# Patient Record
Sex: Male | Born: 1970
Health system: Southern US, Community
[De-identification: ages and names within clinical notes are randomized; demographics above are authoritative.]

## PROBLEM LIST (undated history)

## (undated) DIAGNOSIS — K219 Gastro-esophageal reflux disease without esophagitis: Secondary | ICD-10-CM

## (undated) DIAGNOSIS — M549 Dorsalgia, unspecified: Secondary | ICD-10-CM

## (undated) DIAGNOSIS — L4050 Arthropathic psoriasis, unspecified: Secondary | ICD-10-CM

## (undated) DIAGNOSIS — J449 Chronic obstructive pulmonary disease, unspecified: Secondary | ICD-10-CM

## (undated) DIAGNOSIS — M542 Cervicalgia: Secondary | ICD-10-CM

## (undated) DIAGNOSIS — M1990 Unspecified osteoarthritis, unspecified site: Secondary | ICD-10-CM

## (undated) DIAGNOSIS — G4730 Sleep apnea, unspecified: Secondary | ICD-10-CM

## (undated) DIAGNOSIS — E039 Hypothyroidism, unspecified: Secondary | ICD-10-CM

## (undated) DIAGNOSIS — M25519 Pain in unspecified shoulder: Secondary | ICD-10-CM

## (undated) DIAGNOSIS — R519 Headache, unspecified: Secondary | ICD-10-CM

## (undated) DIAGNOSIS — R51 Headache: Secondary | ICD-10-CM

## (undated) DIAGNOSIS — I10 Essential (primary) hypertension: Secondary | ICD-10-CM

## (undated) DIAGNOSIS — G8929 Other chronic pain: Secondary | ICD-10-CM

## (undated) HISTORY — DX: Arthropathic psoriasis, unspecified: L40.50

## (undated) HISTORY — PX: NECK SURGERY: SHX720

## (undated) HISTORY — PX: JOINT REPLACEMENT: SHX530

## (undated) HISTORY — PX: SHOULDER SURGERY: SHX246

## (undated) HISTORY — DX: Essential (primary) hypertension: I10

---

## 2001-06-09 ENCOUNTER — Encounter (HOSPITAL_COMMUNITY): Admission: RE | Admit: 2001-06-09 | Discharge: 2001-07-09 | Payer: Self-pay | Admitting: Preventative Medicine

## 2001-07-26 ENCOUNTER — Emergency Department (HOSPITAL_COMMUNITY): Admission: EM | Admit: 2001-07-26 | Discharge: 2001-07-26 | Payer: Self-pay | Admitting: *Deleted

## 2002-06-09 ENCOUNTER — Ambulatory Visit (HOSPITAL_COMMUNITY): Admission: RE | Admit: 2002-06-09 | Discharge: 2002-06-09 | Payer: Self-pay | Admitting: Internal Medicine

## 2004-04-10 ENCOUNTER — Ambulatory Visit (HOSPITAL_COMMUNITY): Admission: RE | Admit: 2004-04-10 | Discharge: 2004-04-10 | Payer: Self-pay | Admitting: Family Medicine

## 2005-09-26 ENCOUNTER — Inpatient Hospital Stay (HOSPITAL_COMMUNITY): Admission: EM | Admit: 2005-09-26 | Discharge: 2005-09-29 | Payer: Self-pay | Admitting: Emergency Medicine

## 2005-10-06 ENCOUNTER — Encounter (HOSPITAL_COMMUNITY): Admission: RE | Admit: 2005-10-06 | Discharge: 2005-11-05 | Payer: Self-pay | Admitting: General Surgery

## 2005-10-15 ENCOUNTER — Emergency Department (HOSPITAL_COMMUNITY): Admission: EM | Admit: 2005-10-15 | Discharge: 2005-10-15 | Payer: Self-pay | Admitting: Emergency Medicine

## 2005-10-18 ENCOUNTER — Emergency Department (HOSPITAL_COMMUNITY): Admission: EM | Admit: 2005-10-18 | Discharge: 2005-10-18 | Payer: Self-pay | Admitting: *Deleted

## 2005-11-10 ENCOUNTER — Ambulatory Visit (HOSPITAL_COMMUNITY): Admission: RE | Admit: 2005-11-10 | Discharge: 2005-11-10 | Payer: Self-pay | Admitting: Neurosurgery

## 2005-11-30 ENCOUNTER — Ambulatory Visit (HOSPITAL_COMMUNITY): Admission: RE | Admit: 2005-11-30 | Discharge: 2005-12-01 | Payer: Self-pay | Admitting: Neurosurgery

## 2006-02-03 ENCOUNTER — Encounter (HOSPITAL_COMMUNITY): Admission: RE | Admit: 2006-02-03 | Discharge: 2006-03-05 | Payer: Self-pay | Admitting: Neurosurgery

## 2006-02-04 ENCOUNTER — Encounter (HOSPITAL_COMMUNITY): Admission: RE | Admit: 2006-02-04 | Discharge: 2006-03-06 | Payer: Self-pay | Admitting: Internal Medicine

## 2006-02-23 ENCOUNTER — Ambulatory Visit: Payer: Self-pay | Admitting: Internal Medicine

## 2006-03-01 ENCOUNTER — Ambulatory Visit (HOSPITAL_COMMUNITY): Admission: RE | Admit: 2006-03-01 | Discharge: 2006-03-01 | Payer: Self-pay | Admitting: Internal Medicine

## 2006-03-01 ENCOUNTER — Ambulatory Visit: Payer: Self-pay | Admitting: Internal Medicine

## 2006-04-29 ENCOUNTER — Ambulatory Visit (HOSPITAL_COMMUNITY): Admission: RE | Admit: 2006-04-29 | Discharge: 2006-04-29 | Payer: Self-pay | Admitting: Neurosurgery

## 2006-09-01 ENCOUNTER — Ambulatory Visit: Payer: Self-pay | Admitting: Physical Medicine and Rehabilitation

## 2006-09-01 ENCOUNTER — Encounter
Admission: RE | Admit: 2006-09-01 | Discharge: 2006-11-30 | Payer: Self-pay | Admitting: Physical Medicine and Rehabilitation

## 2006-09-20 ENCOUNTER — Ambulatory Visit (HOSPITAL_COMMUNITY)
Admission: RE | Admit: 2006-09-20 | Discharge: 2006-09-20 | Payer: Self-pay | Admitting: Physical Medicine and Rehabilitation

## 2006-10-15 ENCOUNTER — Ambulatory Visit: Payer: Self-pay | Admitting: Physical Medicine and Rehabilitation

## 2006-10-20 ENCOUNTER — Ambulatory Visit (HOSPITAL_COMMUNITY)
Admission: RE | Admit: 2006-10-20 | Discharge: 2006-10-20 | Payer: Self-pay | Admitting: Physical Medicine and Rehabilitation

## 2006-10-20 ENCOUNTER — Encounter
Admission: RE | Admit: 2006-10-20 | Discharge: 2006-10-20 | Payer: Self-pay | Admitting: Physical Medicine and Rehabilitation

## 2006-11-22 ENCOUNTER — Emergency Department (HOSPITAL_COMMUNITY): Admission: EM | Admit: 2006-11-22 | Discharge: 2006-11-22 | Payer: Self-pay | Admitting: Emergency Medicine

## 2008-12-23 ENCOUNTER — Emergency Department (HOSPITAL_COMMUNITY): Admission: EM | Admit: 2008-12-23 | Discharge: 2008-12-23 | Payer: Self-pay | Admitting: Emergency Medicine

## 2009-04-10 ENCOUNTER — Observation Stay (HOSPITAL_COMMUNITY): Admission: EM | Admit: 2009-04-10 | Discharge: 2009-04-11 | Payer: Self-pay | Admitting: Emergency Medicine

## 2009-04-10 ENCOUNTER — Ambulatory Visit: Payer: Self-pay | Admitting: Cardiology

## 2009-04-11 ENCOUNTER — Encounter (INDEPENDENT_AMBULATORY_CARE_PROVIDER_SITE_OTHER): Payer: Self-pay | Admitting: Internal Medicine

## 2010-01-26 ENCOUNTER — Encounter: Payer: Self-pay | Admitting: Internal Medicine

## 2010-03-26 LAB — COMPREHENSIVE METABOLIC PANEL
Albumin: 4.5 g/dL (ref 3.5–5.2)
BUN: 17 mg/dL (ref 6–23)
Creatinine, Ser: 1.02 mg/dL (ref 0.4–1.5)
Glucose, Bld: 97 mg/dL (ref 70–99)
Total Protein: 7.2 g/dL (ref 6.0–8.3)

## 2010-03-26 LAB — DIFFERENTIAL
Basophils Absolute: 0.1 10*3/uL (ref 0.0–0.1)
Lymphocytes Relative: 34 % (ref 12–46)
Monocytes Absolute: 0.6 10*3/uL (ref 0.1–1.0)
Monocytes Relative: 8 % (ref 3–12)
Neutro Abs: 3.8 10*3/uL (ref 1.7–7.7)
Neutrophils Relative %: 54 % (ref 43–77)

## 2010-03-26 LAB — POCT CARDIAC MARKERS
CKMB, poc: <1 ng/mL — ABNORMAL LOW (ref 1.0–8.0)
Myoglobin, poc: 60.5 ng/mL (ref 12–200)

## 2010-03-26 LAB — CARDIAC PANEL(CRET KIN+CKTOT+MB+TROPI)
Total CK: 125 U/L (ref 7–232)
Troponin I: 0.04 ng/mL (ref 0.00–0.06)

## 2010-03-26 LAB — CBC
HCT: 42.6 % (ref 39.0–52.0)
MCHC: 36.3 g/dL — ABNORMAL HIGH (ref 30.0–36.0)
MCV: 92 fL (ref 78.0–100.0)
Platelets: 158 10*3/uL (ref 150–400)
RDW: 12.7 % (ref 11.5–15.5)

## 2010-05-20 NOTE — Assessment & Plan Note (Signed)
INCOMPLETE           ______________________________  Brantley Stage, M.D.     DMK/MedQ  D:  11/15/2006 09:40:36  T:  11/15/2006 10:50:58  Job #:  [id number]
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
## 2010-05-23 NOTE — Consult Note (Signed)
NAME:  Charles [name], Charles [name]                           ACCOUNT NO.:  [id number]   MEDICAL RECORD NO.:  [id number]                   PATIENT TYPE:   LOCATION:                                       FACILITY:  [facility]   PHYSICIAN:  R. Roetta Sessions, M.D.              DATE OF BIRTH:  23-Apr-1970   DATE OF CONSULTATION:  05/24/2002  DATE OF DISCHARGE:                                   CONSULTATION   REFERRING PHYSICIAN:  Dr. Raelyn Number, ED physician at [facility].   CHIEF COMPLAINT:  The patient is a pleasant 40 year old gentleman sent over  courtesy of Dr. Nadene Rubins to further evaluate a couple of episodes of abdominal  cramps and gross blood per rectum last week.   HISTORY:  The patient's baseline malfunction is one bowel movement every 2-3  days.  He started having abdominal cramps and watery diarrhea with some  blood per rectum two days ago.  Abdominal cramps have persisted but the  diarrhea and rectal bleeding have settled down.  In the emergency department  he had a mildly elevated white count of 13.7, H&H 16.9 and 49.7.  Amylase  was normal  at 64.  LFTs were normal.  Calcium was slightly elevated at  10.5.  Apparently he was treated symptomatically and told to come here.  He  has not had any unusual travel, no unusual exposure, no one else around him  has been ill.  There is no family history of IBD or colorectal neoplasia.  He has not had any nausea or vomiting.  He denies weight loss.  I saw this  nice gentleman back in 1995 for gastroesophageal reflux disease, history of  erosive reflux esophagitis.  He was being worked up for antireflux surgery  but never had it done.  He has been maintained on his own on antacids and  over the counter acid suppressing agents.  He does not have any odynophagia,  dysphagia, early satiety, nausea or vomiting.  Again there has been no  melena but there has been some gross blood per rectum in association with  diarrhea.  He does not take any  nonsteroidal agents.   MEDICATIONS:  1. He has been given some Oxycodone for his abdominal pain.  2. He has been taking some Metamucil twice daily.  3. OTC Prilosec.   ALLERGIES:  No known drug allergies.   FAMILY HISTORY:  Negative for chronic GI or liver disease.   SOCIAL HISTORY:  Patient is single.  He does smoke.  He works in  Hol[employer].  He does not have a primary care physician and really has not  seen a doctor since he saw me in 1995.   REVIEW OF SYSTEMS:  No chest pain, no dyspnea, no fever, chills.   PHYSICAL EXAMINATION:  GENERAL:  A pleasant 40 year old gentleman who  appears comfortable.  He certainly does not appear in any distress  whatsoever.  VITAL SIGNS:  Weight 171, BP 104/68, pulse 76.  He weighed 162 pounds back  in 1995.  SKIN:  Warm and dry.  There is no jaundice.  HEENT:  No scleral icterus.  JVD is not prominent.  CHEST:  Lungs are clear to auscultation.  HEART:  Regular, rate and rhythm without murmur, gallop or rub.  ABDOMEN:  Nondistended.  Positive bowel sounds.  Soft, nontender without  appreciated mass, organomegaly.  EXTREMITIES:  No edema.  RECTAL:  Deferred till the time of colonoscopy.   IMPRESSION:  The patient is a 40 year old gentleman with a recent acute  illness characterized by diffuse abdominal cramps and bloody diarrhea.  His  diarrhea and blood per rectum have subsided but he is still having some  abdominal cramps.  I suspect most likely __________ enteric infection.  He  certainly does not appear acutely ill or toxic at this time.  His reflux  symptoms have been fairly well controlled with OTC Prilosec.  I do not  detect any __________ features.   RECOMMENDATIONS:  1. The patient really just needs to go ahead and have a colonoscopy.  I have     discussed this approach with the patient the potential risks, benefits,     alternative and a few questions answered and he is agreeable.  Will plan     to preform a colonoscopy in the  near future at [facility]  2. Further recommendations to follow.  3. I would like to thank Dr. Raelyn Number for letting me see this nice     gentleman today.  4. We will also give the patient some NuLev tablets one orally q.a.c. and     q.h.s.  p.r.n. abdominal cramps.                                               Jonathon Bellows, M.D.    RMR/MEDQ  D:  05/24/2002  T:  05/24/2002  Job:  [id number]   cc:   Raelyn Number  [facility]

## 2010-05-23 NOTE — Op Note (Signed)
NAMEMAXIE, SLOVACEK NO.:  [id number]   MEDICAL RECORD NO.:  [id number]          PATIENT TYPE:  AMB   LOCATION:  DAY                           FACILITY:  [facility]   PHYSICIAN:  R. Roetta Sessions, M.D. DATE OF BIRTH:  March 14, 1970   DATE OF PROCEDURE:  DATE OF DISCHARGE:                               OPERATIVE REPORT   PROCEDURE:  EGD, diagnostic.   INDICATIONS FOR PROCEDURE:  The patient is a 40 year old gentleman with  longstanding gastroesophageal reflux disease symptoms, intermittent  epigastric pain, intermittent vague esophageal dysphagia.  EGD is now  being done.  Potential for esophageal dilation has been reviewed and  questions answered.  He is agreeable.  Please see the documentation in  the medical record.   PROCEDURE NOTE:  O2 saturation, blood pressure, pulse and respirations  monitored throughout the entire procedure.  Conscious sedation was with  Versed 6 mg IV, Demerol 125 mg IV in divided doses.  Xylocaine spray for  topical oropharyngeal anesthesia.  The instrument used was the Pentax  video chip system.   FINDINGS:  Examination of the tubular esophagus revealed a  widely/patulous EG junction with four quadrant distal esophageal  erosions.  There was no ring, stricture or evidence neoplasm as with  Barrett's esophagus.  Again, the EG junction was wide open as was the  entire tubular esophagus.  Examination of the gastric mucosa was  undertaken after stomach fully inflated with air.  It insufflated very  well with air.  Thorough examination of the gastric mucosa, including  retroflex view of the proximal stomach and esophagogastric junction  demonstrated only a small hiatal hernia.  The pylorus was patent and  easily traversed.  Examination of the bulb and second portion revealed  no abnormalities.   THERAPEUTIC/DIAGNOSTIC MANEUVERS:  None.   The patient tolerated the procedure well and as reactive in endoscopy.   IMPRESSION:  Four quadrant  distal esophageal erosions consistent with  moderately severe erosive reflux esophagitis, widely patent esophagus,  patulous esophagogastric junction, hiatal hernia.  Otherwise normal  stomach and first and second portions of the duodenum.   No dilation needed.   RECOMMENDATIONS:  The patient is on no acid suppression therapy.  He  will begin Aciphex 20 tablets orally twice daily.  He is to go by my  office for samples.  Prescription given.  Antireflux literature provided  to Mr. Brandel.  We will plan to see him back in the office in 1 month.      Jonathon Bellows, M.D.  Electronically Signed     RMR/MEDQ  D:  03/01/2006  T:  03/01/2006  Job:  [id number]   cc:   Madelin Rear. Sherwood Gambler, MD  Fax: [phone]
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
## 2010-11-18 ENCOUNTER — Emergency Department (HOSPITAL_COMMUNITY)
Admission: EM | Admit: 2010-11-18 | Discharge: 2010-11-18 | Disposition: A | Discharge status: Home / Self Care | Payer: Self-pay | Attending: Emergency Medicine | Admitting: Emergency Medicine

## 2010-11-18 ENCOUNTER — Encounter: Payer: Self-pay | Admitting: *Deleted

## 2010-11-18 DIAGNOSIS — M792 Neuralgia and neuritis, unspecified: Secondary | ICD-10-CM

## 2010-11-18 DIAGNOSIS — R5381 Other malaise: Secondary | ICD-10-CM | POA: Insufficient documentation

## 2010-11-18 DIAGNOSIS — R110 Nausea: Secondary | ICD-10-CM | POA: Insufficient documentation

## 2010-11-18 DIAGNOSIS — R209 Unspecified disturbances of skin sensation: Secondary | ICD-10-CM | POA: Insufficient documentation

## 2010-11-18 DIAGNOSIS — IMO0002 Reserved for concepts with insufficient information to code with codable children: Secondary | ICD-10-CM | POA: Insufficient documentation

## 2010-11-18 DIAGNOSIS — R5383 Other fatigue: Secondary | ICD-10-CM | POA: Insufficient documentation

## 2010-11-18 LAB — DIFFERENTIAL
Eosinophils Relative: 2 % (ref 0–5)
Lymphocytes Relative: 34 % (ref 12–46)
Lymphs Abs: 2.4 10*3/uL (ref 0.7–4.0)
Monocytes Relative: 7 % (ref 3–12)

## 2010-11-18 LAB — URINALYSIS, ROUTINE W REFLEX MICROSCOPIC
Bilirubin Urine: NEGATIVE
Ketones, ur: NEGATIVE mg/dL
Nitrite: NEGATIVE
Protein, ur: NEGATIVE mg/dL
Urobilinogen, UA: 0.2 mg/dL (ref 0.0–1.0)

## 2010-11-18 LAB — COMPREHENSIVE METABOLIC PANEL
ALT: 45 U/L (ref 0–53)
Alkaline Phosphatase: 50 U/L (ref 39–117)
BUN: 13 mg/dL (ref 6–23)
CO2: 27 mEq/L (ref 19–32)
GFR calc Af Amer: >90 mL/min (ref >90)
GFR calc non Af Amer: >90 mL/min (ref >90)
Glucose, Bld: 87 mg/dL (ref 70–99)
Potassium: 3.8 mEq/L (ref 3.5–5.1)
Sodium: 137 mEq/L (ref 135–145)

## 2010-11-18 LAB — CBC
Hemoglobin: 15.2 g/dL (ref 13.0–17.0)
MCH: 32.8 pg (ref 26.0–34.0)
MCV: 92.7 fL (ref 78.0–100.0)
Platelets: 163 10*3/uL (ref 150–400)
RBC: 4.64 MIL/uL (ref 4.22–5.81)
WBC: 7.2 10*3/uL (ref 4.0–10.5)

## 2010-11-18 NOTE — ED Notes (Signed)
Pt c/o numbness to arms and legs bilateral. Pt states he has had sx for 2 weeks and it is getting worse.

## 2010-11-18 NOTE — ED Provider Notes (Signed)
History    Scribed for Benny Lennert, MD, the patient was seen in room APA07/APA07. This chart was scribed by Katha Cabal.   CSN: [id number] Arrival date & time: 11/18/2010  2:16 PM   First MD Initiated Contact with Patient 11/18/10 1527      Chief Complaint  Patient presents with  . Numbness    (Consider location/radiation/quality/duration/timing/severity/associated sxs/prior treatment)  History is provided by the patient.  Charles [name] is a 40 y.o. male who presents to the Emergency Department complaining of gradual worsening of mild to moderate intermittent bilateral UE and LE numbness.  Symptom is associated with intermittent weakness. Patient denies difficulty walking. Patient states that his legs at times "feel like the will give out on me".  Patient states weakness began in right LE about a month ago.  Numbness then began in left LE and then bilateral UE.  Patient reports gradual worsening of numbness for the last 2 weeks. Patient reports one episode of nausea yesterday and another episode today.  Symptoms are not associated with cough, chest pain, SOB or fever.  Patient reports cloudy urine. Patient reports numbness from knees upward.  Patient reports left LE numbness in ED.       History reviewed. No pertinent past medical history.  Past Surgical History  Procedure Date  . Shoulder surgery   . Neck surgery     No family history on file.  History  Substance Use Topics  . Smoking status: Former Games developer  . Smokeless tobacco: Not on file  . 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      Review of Systems  Constitutional: Negative for fever and fatigue.  HENT: Negative for congestion, sinus pressure and ear discharge.   Eyes: Negative for discharge.  Respiratory: Negative for cough.   Gastrointestinal: Positive for nausea. Negative for diarrhea.  Genitourinary: Negative for frequency and hematuria.  Musculoskeletal:  Negative for back pain.  Skin: Negative for rash.  Neurological: Positive for weakness and numbness. Negative for seizures.  Hematological: Negative.   Psychiatric/Behavioral: Negative for hallucinations.    Allergies  Review of patient's allergies indicates no known allergies.  Home Medications  No current outpatient prescriptions on file.  BP 132/71  Pulse 63  Temp(Src) 97.8 F (36.6 C) (Oral)  Resp 20  Ht 6' (1.829 m)  Wt 185 lb (83.915 kg)  BMI 25.09 kg/m2  SpO2 98%  Physical Exam  Constitutional: He is oriented to person, place, and time. He appears well-developed. No distress.  HENT:  Head: Normocephalic and atraumatic.  Right Ear: Tympanic membrane normal.  Left Ear: Tympanic membrane normal.  Mouth/Throat: Oropharynx is clear and moist.  Eyes: Conjunctivae and EOM are normal. Pupils are equal, round, and reactive to light. No scleral icterus.  Neck: Neck supple. No thyromegaly present.  Cardiovascular: Normal rate and regular rhythm.  Exam reveals no gallop and no friction rub.   No murmur heard. Pulmonary/Chest: Effort normal and breath sounds normal. No stridor. No respiratory distress. He has no wheezes. He has no rales. He exhibits no tenderness.  Abdominal: He exhibits no distension. There is no tenderness. There is no rebound.  Musculoskeletal: Normal range of motion. He exhibits no edema.  Lymphadenopathy:    He has no cervical adenopathy.  Neurological: He is alert and oriented to person, place, and time. He has normal strength. A sensory deficit is present. No cranial nerve deficit. Coordination normal.  Decreased sensation in left thigh and entire left arm   Skin: No rash noted. No erythema.       Very dry and cracked hands   Psychiatric: He has a normal mood and affect. His behavior is normal.    ED Course  Procedures (including critical care time)   DIAGNOSTIC STUDIES: Oxygen Saturation is 98% on room air, normal by my interpretation.     COORDINATION OF CARE:  3:42 PM  Physical exam complete.  Will order labs.  6:38 PM  Discussed lab findings with patient.   6:49 PM  Plan to discharge patient.  Patient agrees with plan.       LABS / RADIOLOGY:     Labs Reviewed  CBC  DIFFERENTIAL  COMPREHENSIVE METABOLIC PANEL  URINALYSIS, ROUTINE W REFLEX MICROSCOPIC   Results for orders placed during the hospital encounter of 11/18/10  CBC      Component Value Range   WBC 7.2  4.0 - 10.5 (K/uL)   RBC 4.64  4.22 - 5.81 (MIL/uL)   Hemoglobin 15.2  13.0 - 17.0 (g/dL)   HCT 11.9  14.7 - 82.9 (%)   MCV 92.7  78.0 - 100.0 (fL)   MCH 32.8  26.0 - 34.0 (pg)   MCHC 35.3  30.0 - 36.0 (g/dL)   RDW 56.2  13.0 - 86.5 (%)   Platelets 163  150 - 400 (K/uL)  DIFFERENTIAL      Component Value Range   Neutrophils Relative 56  43 - 77 (%)   Neutro Abs 4.1  1.7 - 7.7 (K/uL)   Lymphocytes Relative 34  12 - 46 (%)   Lymphs Abs 2.4  0.7 - 4.0 (K/uL)   Monocytes Relative 7  3 - 12 (%)   Monocytes Absolute 0.5  0.1 - 1.0 (K/uL)   Eosinophils Relative 2  0 - 5 (%)   Eosinophils Absolute 0.2  0.0 - 0.7 (K/uL)   Basophils Relative 0  0 - 1 (%)   Basophils Absolute 0.0  0.0 - 0.1 (K/uL)  COMPREHENSIVE METABOLIC PANEL      Component Value Range   Sodium 137  135 - 145 (mEq/L)   Potassium 3.8  3.5 - 5.1 (mEq/L)   Chloride 102  96 - 112 (mEq/L)   CO2 27  19 - 32 (mEq/L)   Glucose, Bld 87  70 - 99 (mg/dL)   BUN 13  6 - 23 (mg/dL)   Creatinine, Ser 7.84  0.50 - 1.35 (mg/dL)   Calcium 69.6  8.4 - 10.5 (mg/dL)   Total Protein 7.2  6.0 - 8.3 (g/dL)   Albumin 4.2  3.5 - 5.2 (g/dL)   AST 35  0 - 37 (U/L)   ALT 45  0 - 53 (U/L)   Alkaline Phosphatase 50  39 - 117 (U/L)   Total Bilirubin 0.8  0.3 - 1.2 (mg/dL)   GFR calc non Af Amer >90  >90 (mL/min)   GFR calc Af Amer >90  >90 (mL/min)  URINALYSIS, ROUTINE W REFLEX MICROSCOPIC      Component Value Range   Color, Urine YELLOW  YELLOW    Appearance CLEAR  CLEAR    Specific Gravity,  Urine 1.025  1.005 - 1.030    pH 5.5  5.0 - 8.0    Glucose, UA NEGATIVE  NEGATIVE (mg/dL)   Hgb urine dipstick NEGATIVE  NEGATIVE    Bilirubin Urine NEGATIVE  NEGATIVE    Ketones, ur NEGATIVE  NEGATIVE (mg/dL)  Protein, ur NEGATIVE  NEGATIVE (mg/dL)   Urobilinogen, UA 0.2  0.0 - 1.0 (mg/dL)   Nitrite NEGATIVE  NEGATIVE    Leukocytes, UA NEGATIVE  NEGATIVE      No results found.       MDM   [id number],  anxiety    MEDICATIONS GIVEN IN THE E.D. Scheduled Meds:   Continuous Infusions:       IMPRESSION: 1. Neuritis      DISCHARGE MEDICATIONS: New Prescriptions   No medications on file      The chart was scribed for me under my direct supervision.  I personally performed the history, physical, and medical decision making and all procedures in the evaluation of this patient.Benny Lennert, MD 11/18/10 [phone]

## 2010-11-18 NOTE — ED Notes (Signed)
Patient report given to this nurse. Assuming care of patient.  

## 2010-11-18 NOTE — ED Notes (Signed)
MD at bedside. 

## 2011-03-18 ENCOUNTER — Emergency Department (HOSPITAL_COMMUNITY): Payer: Self-pay

## 2011-03-18 ENCOUNTER — Other Ambulatory Visit: Payer: Self-pay

## 2011-03-18 ENCOUNTER — Emergency Department (HOSPITAL_COMMUNITY)
Admission: EM | Admit: 2011-03-18 | Discharge: 2011-03-18 | Disposition: A | Discharge status: Home / Self Care | Payer: Self-pay | Attending: Emergency Medicine | Admitting: Emergency Medicine

## 2011-03-18 ENCOUNTER — Encounter (HOSPITAL_COMMUNITY): Payer: Self-pay | Admitting: Emergency Medicine

## 2011-03-18 DIAGNOSIS — R0602 Shortness of breath: Secondary | ICD-10-CM | POA: Insufficient documentation

## 2011-03-18 DIAGNOSIS — R059 Cough, unspecified: Secondary | ICD-10-CM | POA: Insufficient documentation

## 2011-03-18 DIAGNOSIS — R05 Cough: Secondary | ICD-10-CM | POA: Insufficient documentation

## 2011-03-18 DIAGNOSIS — R079 Chest pain, unspecified: Secondary | ICD-10-CM | POA: Insufficient documentation

## 2011-03-18 DIAGNOSIS — R062 Wheezing: Secondary | ICD-10-CM | POA: Insufficient documentation

## 2011-03-18 DIAGNOSIS — J209 Acute bronchitis, unspecified: Secondary | ICD-10-CM | POA: Insufficient documentation

## 2011-03-18 LAB — DIFFERENTIAL
Basophils Absolute: 0 10*3/uL (ref 0.0–0.1)
Basophils Relative: 0 % (ref 0–1)
Eosinophils Absolute: 0.1 10*3/uL (ref 0.0–0.7)
Monocytes Relative: 8 % (ref 3–12)
Neutrophils Relative %: 80 % — ABNORMAL HIGH (ref 43–77)

## 2011-03-18 LAB — BASIC METABOLIC PANEL
BUN: 12 mg/dL (ref 6–23)
GFR calc Af Amer: >90 mL/min (ref >90)
GFR calc non Af Amer: >90 mL/min (ref >90)
Potassium: 3.7 mEq/L (ref 3.5–5.1)
Sodium: 137 mEq/L (ref 135–145)

## 2011-03-18 LAB — CBC
MCH: 32.6 pg (ref 26.0–34.0)
MCHC: 35.6 g/dL (ref 30.0–36.0)
Platelets: 150 10*3/uL (ref 150–400)
RDW: 12.4 % (ref 11.5–15.5)

## 2011-03-18 LAB — POCT I-STAT TROPONIN I: Troponin i, poc: 0 ng/mL (ref 0.00–0.08)

## 2011-03-18 MED ORDER — PREDNISONE 20 MG PO TABS: 60.0000 mg | ORAL_TABLET | Freq: Every day | ORAL | Status: DC

## 2011-03-18 MED ORDER — ALBUTEROL SULFATE (5 MG/ML) 0.5% IN NEBU
5.0000 mg | INHALATION_SOLUTION | Freq: Once | RESPIRATORY_TRACT | Status: AC
  Administered 2011-03-18: 5 mg via RESPIRATORY_TRACT
  Filled 2011-03-18: qty 1

## 2011-03-18 MED ORDER — AZITHROMYCIN 250 MG PO TABS
500.0000 mg | ORAL_TABLET | Freq: Once | ORAL | Status: AC
  Administered 2011-03-18: 500 mg via ORAL
  Filled 2011-03-18: qty 2

## 2011-03-18 MED ORDER — ALBUTEROL SULFATE HFA 108 (90 BASE) MCG/ACT IN AERS
2.0000 | INHALATION_SPRAY | RESPIRATORY_TRACT | Status: DC
  Administered 2011-03-18: 2 via RESPIRATORY_TRACT
  Filled 2011-03-18 (×2): qty 6.7

## 2011-03-18 MED ORDER — PREDNISONE 20 MG PO TABS
60.0000 mg | ORAL_TABLET | Freq: Once | ORAL | Status: AC
  Administered 2011-03-18: 60 mg via ORAL
  Filled 2011-03-18: qty 3

## 2011-03-18 MED ORDER — AZITHROMYCIN 250 MG PO TABS: 250.0000 mg | ORAL_TABLET | Freq: Every day | ORAL | Status: AC

## 2011-03-18 MED ORDER — ALBUTEROL SULFATE (5 MG/ML) 0.5% IN NEBU
10.0000 mg | INHALATION_SOLUTION | Freq: Once | RESPIRATORY_TRACT | Status: AC
  Administered 2011-03-18: 10 mg via RESPIRATORY_TRACT
  Filled 2011-03-18: qty 2

## 2011-03-18 MED ORDER — IPRATROPIUM BROMIDE 0.02 % IN SOLN
0.5000 mg | Freq: Once | RESPIRATORY_TRACT | Status: AC
  Administered 2011-03-18: 0.5 mg via RESPIRATORY_TRACT
  Filled 2011-03-18: qty 2.5

## 2011-03-18 NOTE — Discharge Instructions (Signed)
Acute Bronchitis You have acute bronchitis. This means you have a chest cold. The airways in your lungs are red and sore (inflamed). Acute means it is sudden onset.  CAUSES Bronchitis is most often caused by the same virus that causes a cold. SYMPTOMS   Body aches.   Chest congestion.   Chills.   Cough.   Fever.   Shortness of breath.   Sore throat.  TREATMENT  Acute bronchitis is usually treated with rest, fluids, and medicines for relief of fever or cough. Most symptoms should go away after a few days or a week. Increased fluids may help thin your secretions and will prevent dehydration. Your caregiver may give you an inhaler to improve your symptoms. The inhaler reduces shortness of breath and helps control cough. You can take over-the-counter pain relievers or cough medicine to decrease coughing, pain, or fever. A cool-air vaporizer may help thin bronchial secretions and make it easier to clear your chest. Antibiotics are usually not needed but can be prescribed if you smoke, are seriously ill, have chronic lung problems, are elderly, or you are at higher risk for developing complications.Allergies and asthma can make bronchitis worse. Repeated episodes of bronchitis may cause longstanding lung problems. Avoid smoking and secondhand smoke.Exposure to cigarette smoke or irritating chemicals will make bronchitis worse. If you are a cigarette smoker, consider using nicotine gum or skin patches to help control withdrawal symptoms. Quitting smoking will help your lungs heal faster. Recovery from bronchitis is often slow, but you should start feeling better after 2 to 3 days. Cough from bronchitis frequently lasts for 3 to 4 weeks. To prevent another bout of acute bronchitis:  Quit smoking.   Wash your hands frequently to get rid of viruses or use a hand sanitizer.   Avoid other people with cold or virus symptoms.   Try not to touch your hands to your mouth, nose, or eyes.  SEEK  IMMEDIATE MEDICAL CARE IF:  You develop increased fever, chills, or chest pain.   You have severe shortness of breath or bloody sputum.   You develop dehydration, fainting, repeated vomiting, or a severe headache.   You have no improvement after 1 week of treatment or you get worse.  MAKE SURE YOU:   Understand these instructions.   Will watch your condition.   Will get help right away if you are not doing well or get worse.  Document Released: 01/30/2004 Document Revised: 12/11/2010 Document Reviewed: 04/16/2010 [facility] Patient Information 2012 [place], [place].    Take your next dose of both the prednisone and the zithromax tomorrow evening.  Use the inhaler given - 2 puffs every 4 hours if you are wheezing,  Short of breath or coughing.  Rest,  Return here if you develop worsened symptoms.  Smoking is greatly contributing to your symptoms,  As discussed.Acute Bronchitis You have acute bronchitis. This means you have a chest cold. The airways in your lungs are red and sore (inflamed). Acute means it is sudden onset.  CAUSES Bronchitis is most often caused by the same virus that causes a cold. SYMPTOMS   Body aches.   Chest congestion.   Chills.   Cough.   Fever.   Shortness of breath.   Sore throat.  TREATMENT  Acute bronchitis is usually treated with rest, fluids, and medicines for relief of fever or cough. Most symptoms should go away after a few days or a week. Increased fluids may help thin your secretions and will prevent dehydration. Your  caregiver may give you an inhaler to improve your symptoms. The inhaler reduces shortness of breath and helps control cough. You can take over-the-counter pain relievers or cough medicine to decrease coughing, pain, or fever. A cool-air vaporizer may help thin bronchial secretions and make it easier to clear your chest. Antibiotics are usually not needed but can be prescribed if you smoke, are seriously ill, have chronic lung  problems, are elderly, or you are at higher risk for developing complications.Allergies and asthma can make bronchitis worse. Repeated episodes of bronchitis may cause longstanding lung problems. Avoid smoking and secondhand smoke.Exposure to cigarette smoke or irritating chemicals will make bronchitis worse. If you are a cigarette smoker, consider using nicotine gum or skin patches to help control withdrawal symptoms. Quitting smoking will help your lungs heal faster. Recovery from bronchitis is often slow, but you should start feeling better after 2 to 3 days. Cough from bronchitis frequently lasts for 3 to 4 weeks. To prevent another bout of acute bronchitis:  Quit smoking.   Wash your hands frequently to get rid of viruses or use a hand sanitizer.   Avoid other people with cold or virus symptoms.   Try not to touch your hands to your mouth, nose, or eyes.  SEEK IMMEDIATE MEDICAL CARE IF:  You develop increased fever, chills, or chest pain.   You have severe shortness of breath or bloody sputum.   You develop dehydration, fainting, repeated vomiting, or a severe headache.   You have no improvement after 1 week of treatment or you get worse.  MAKE SURE YOU:   Understand these instructions.   Will watch your condition.   Will get help right away if you are not doing well or get worse.  Document Released: 01/30/2004 Document Revised: 12/11/2010 Document Reviewed: 04/16/2010 [facility] Patient Information 2012 [place], [place].

## 2011-03-18 NOTE — ED Notes (Signed)
Pt c/o prod cough and congestion with mostly white but sometimes green sputum x 1 month. Sob started last night. Pressure across chest started this am constant since 630. Denies n/v/d/dizziness. C/o being weak all over. nondiaphoretic at this time. Nad

## 2011-03-18 NOTE — ED Provider Notes (Signed)
History     CSN: [id number]  Arrival date & time 03/18/11  1030   First MD Initiated Contact with Patient 03/18/11 1115      Chief Complaint  Patient presents with  . Shortness of Breath  . Chest Pain    (Consider location/radiation/quality/duration/timing/severity/associated sxs/prior treatment) HPI Comments: Patient presents with cough which has been productive of white and sometimes green sputum for the past month and has noted increased shortness of breath with exertion which has been worse since last night.  He denies fevers or chills, describes pressure across his bilateral lower anterior chest wall which is worse with deep inspiration and coughing.  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  He did not wear a mask or ventilator at work.  Patient is a 41 y.o. male presenting with shortness of breath and chest pain. The history is provided by the patient.  Shortness of Breath  The current episode started yesterday. The onset was gradual. The problem occurs continuously. The problem has been gradually worsening. The problem is moderate. The symptoms are relieved by nothing. The symptoms are aggravated by activity. Associated symptoms include chest pain, chest pressure, cough and wheezing. Pertinent negatives include no fever, no rhinorrhea, no sore throat and no shortness of breath.  Chest Pain Primary symptoms include cough and wheezing. Pertinent negatives for primary symptoms include no fever, no shortness of breath, no abdominal pain, no nausea and no dizziness.  Pertinent negatives for associated symptoms include no numbness and no weakness.     History reviewed. No pertinent past medical history.  Past Surgical History  Procedure Date  . Shoulder surgery   . Neck surgery     History reviewed. No pertinent family history.  History    Substance Use Topics  . Smoking status: Former Games developer  . Smokeless tobacco: Not on file  . Alcohol Use: Yes     frequently(only twice a week now as of today 03/18/11)      Review of Systems  Constitutional: Negative for fever.  HENT: Negative for congestion, sore throat, rhinorrhea and neck pain.   Eyes: Negative.   Respiratory: Positive for cough and wheezing. Negative for chest tightness and shortness of breath.   Cardiovascular: Positive for chest pain.  Gastrointestinal: Negative for nausea and abdominal pain.  Genitourinary: Negative.   Musculoskeletal: Negative for joint swelling and arthralgias.  Skin: Negative.  Negative for rash and wound.  Neurological: Negative for dizziness, weakness, light-headedness, numbness and headaches.  Hematological: Negative.   Psychiatric/Behavioral: Negative.     Allergies  Review of patient's allergies indicates no known allergies.  Home Medications   Current Outpatient Rx  Name Route Sig Dispense Refill  . EPINEPHRINE BASE 0.22 MG/ACT IN AERS Inhalation Inhale into the lungs once.    . AZITHROMYCIN 250 MG PO TABS Oral Take 1 tablet (250 mg total) by mouth daily. Take one tablet daily for 4 days. 4 tablet 0  . PREDNISONE 20 MG PO TABS Oral Take 3 tablets (60 mg total) by mouth daily. 15 tablet 0    BP 126/69  Pulse 106  Temp(Src) 97.9 F (36.6 C) (Oral)  Resp 20  Ht 6' (1.829 m)  Wt 210 lb (95.255 kg)  BMI 28.48 kg/m2  SpO2 95%  Physical Exam  Nursing note and vitals reviewed. Constitutional: He is oriented to  person, place, and time. He appears well-developed and well-nourished.  HENT:  Head: Normocephalic and atraumatic.  Eyes: Conjunctivae are normal.  Neck: Normal range of motion.  Cardiovascular: Normal rate, regular rhythm, normal heart sounds and intact distal pulses.   Pulmonary/Chest: Effort normal. He has wheezes. He has no rales. He exhibits no tenderness.       Wheezes throughout all lung fields.  Decreased  air movement.  Abdominal: Soft. Bowel sounds are normal. There is no tenderness.  Musculoskeletal: Normal range of motion.  Neurological: He is alert and oriented to person, place, and time.  Skin: Skin is warm and dry.  Psychiatric: He has a normal mood and affect.    ED Course  Procedures (including critical care time)  Labs Reviewed  DIFFERENTIAL - Abnormal; Notable for the following:    Neutrophils Relative 80 (*)    All other components within normal limits  BASIC METABOLIC PANEL - Abnormal; Notable for the following:    Glucose, Bld 102 (*)    All other components within normal limits  CBC  TROPONIN I  POCT I-STAT TROPONIN I   Dg Chest 2 View  03/18/2011  *RADIOLOGY REPORT*  Clinical Data: Shortness of breath and chest pain.  CHEST - 2 VIEW  Comparison: Chest x-ray 04/10/2009.  Findings: Lung volumes are normal.  No consolidative airspace disease.  No pleural effusions.  No pneumothorax.  No pulmonary nodule or mass noted.  Pulmonary vasculature and the cardiomediastinal silhouette are within normal limits.  Orthopedic fixation hardware incompletely visualized in the lower cervical spine.  IMPRESSION: 1. No radiographic evidence of acute cardiopulmonary disease.  Original Report Authenticated By: Florencia Reasons, M.D.     1. Bronchitis with bronchospasm     Patient treated with albuterol 5 mg and Atrovent 0.5 mg neb with moderate improvement in symptoms. Given prednisone 60 mg by mouth. Wheezing has almost resolved with increased aeration throughout all lung fields at reexam.  He does have crackles in his right mid to lower base at reexam.  Will give additional albuterol 10 mg over one hour neb treatment and reassess.  Wheezing resolved towards the end of second albuterol neb treatment.  Patient continues to have crackles at right mid to lower base.  Discussed with Dr. Oletta Lamas and will treat patient with albuterol MDI, prednisone 60 mg pulse dose and Zithromax with first dose  given today.  MDM  Bronchitis    Date: 03/18/2011  Rate: 88  Rhythm: normal sinus rhythm  QRS Axis: normal  Intervals: normal  ST/T Wave abnormalities: normal  Conduction Disutrbances:incomplete right bundle-branch block which is similar to previous EKG dated 04/10/2009  Narrative Interpretation:   Old EKG Reviewed: unchanged          Candis Musa, PA 03/18/11 1711  Candis Musa, PA 03/18/11 1711  Candis Musa, PA 03/18/11 1723

## 2011-03-20 NOTE — ED Provider Notes (Signed)
Medical screening examination/treatment/procedure(s) were conducted as a shared visit with non-physician practitioner(s) and myself.  I personally evaluated the patient during the encounter./ Stable vitals, RA sat is normal.  Pt with chest tightness, worse with exertion, feels dyspnea.  Pt with initial wheeze, now slight rales at both bases, but clear CXR per radiologist.  Pt felt improved after first neb treatment.  Will give additional.  Likely pt can go home with inhaler and oral steroids or inhaled steroids for a few days and instructions to follow up with PCP.    Gavin Pound. Oletta Lamas, MD 03/20/11 [phone]

## 2011-08-10 ENCOUNTER — Other Ambulatory Visit (HOSPITAL_COMMUNITY): Payer: Self-pay | Admitting: Family Medicine

## 2011-08-10 ENCOUNTER — Ambulatory Visit (HOSPITAL_COMMUNITY)
Admission: RE | Admit: 2011-08-10 | Discharge: 2011-08-10 | Disposition: A | Discharge status: Home / Self Care | Payer: BC Managed Care – PPO | Source: Ambulatory Visit | Attending: Family Medicine | Admitting: Family Medicine

## 2011-08-10 DIAGNOSIS — R059 Cough, unspecified: Secondary | ICD-10-CM | POA: Insufficient documentation

## 2011-08-10 DIAGNOSIS — Z720 Tobacco use: Secondary | ICD-10-CM

## 2011-08-10 DIAGNOSIS — F17200 Nicotine dependence, unspecified, uncomplicated: Secondary | ICD-10-CM | POA: Insufficient documentation

## 2011-08-10 DIAGNOSIS — J209 Acute bronchitis, unspecified: Secondary | ICD-10-CM | POA: Insufficient documentation

## 2011-08-10 DIAGNOSIS — R05 Cough: Secondary | ICD-10-CM | POA: Insufficient documentation

## 2011-10-16 ENCOUNTER — Other Ambulatory Visit (HOSPITAL_COMMUNITY): Payer: Self-pay | Admitting: Family Medicine

## 2011-10-16 ENCOUNTER — Ambulatory Visit (HOSPITAL_COMMUNITY)
Admission: RE | Admit: 2011-10-16 | Discharge: 2011-10-16 | Disposition: A | Discharge status: Home / Self Care | Payer: BC Managed Care – PPO | Source: Ambulatory Visit | Attending: Family Medicine | Admitting: Family Medicine

## 2011-10-16 DIAGNOSIS — M542 Cervicalgia: Secondary | ICD-10-CM

## 2011-10-16 DIAGNOSIS — M4040 Postural lordosis, site unspecified: Secondary | ICD-10-CM | POA: Insufficient documentation

## 2011-10-16 DIAGNOSIS — R079 Chest pain, unspecified: Secondary | ICD-10-CM | POA: Insufficient documentation

## 2011-11-18 ENCOUNTER — Emergency Department (HOSPITAL_COMMUNITY)
Admission: EM | Admit: 2011-11-18 | Discharge: 2011-11-18 | Disposition: A | Discharge status: Home / Self Care | Payer: BC Managed Care – PPO | Attending: Emergency Medicine | Admitting: Emergency Medicine

## 2011-11-18 ENCOUNTER — Encounter (HOSPITAL_COMMUNITY): Payer: Self-pay | Admitting: *Deleted

## 2011-11-18 DIAGNOSIS — H16133 Photokeratitis, bilateral: Secondary | ICD-10-CM

## 2011-11-18 DIAGNOSIS — H5789 Other specified disorders of eye and adnexa: Secondary | ICD-10-CM | POA: Insufficient documentation

## 2011-11-18 DIAGNOSIS — H16139 Photokeratitis, unspecified eye: Secondary | ICD-10-CM | POA: Insufficient documentation

## 2011-11-18 DIAGNOSIS — Z87891 Personal history of nicotine dependence: Secondary | ICD-10-CM | POA: Insufficient documentation

## 2011-11-18 DIAGNOSIS — H53149 Visual discomfort, unspecified: Secondary | ICD-10-CM | POA: Insufficient documentation

## 2011-11-18 DIAGNOSIS — J4489 Other specified chronic obstructive pulmonary disease: Secondary | ICD-10-CM | POA: Insufficient documentation

## 2011-11-18 DIAGNOSIS — J449 Chronic obstructive pulmonary disease, unspecified: Secondary | ICD-10-CM | POA: Insufficient documentation

## 2011-11-18 HISTORY — DX: Chronic obstructive pulmonary disease, unspecified: J44.9

## 2011-11-18 MED ORDER — FLUORESCEIN SODIUM 1 MG OP STRP
ORAL_STRIP | OPHTHALMIC | Status: AC
  Filled 2011-11-18: qty 1

## 2011-11-18 MED ORDER — HYDROCODONE-ACETAMINOPHEN 5-325 MG PO TABS
2.0000 | ORAL_TABLET | Freq: Once | ORAL | Status: AC
  Administered 2011-11-18: 2 via ORAL
  Filled 2011-11-18 (×2): qty 1

## 2011-11-18 MED ORDER — HYDROCODONE-ACETAMINOPHEN 5-325 MG PO TABS: 1.0000 | ORAL_TABLET | ORAL | Status: AC | PRN

## 2011-11-18 MED ORDER — NEOMYCIN-BACITRACIN ZN-POLYMYX 5-400-10000 OP OINT
TOPICAL_OINTMENT | Freq: Once | OPHTHALMIC | Status: AC
  Administered 2011-11-18: 05:00:00 via OPHTHALMIC
  Filled 2011-11-18: qty 3.5

## 2011-11-18 MED ORDER — TETRACAINE HCL 0.5 % OP SOLN
1.0000 [drp] | Freq: Once | OPHTHALMIC | Status: AC
  Administered 2011-11-18: 1 [drp] via OPHTHALMIC
  Filled 2011-11-18: qty 2

## 2011-11-18 NOTE — ED Notes (Signed)
Pt alert & oriented x4, stable gait. Patient given discharge instructions, paperwork & prescription(s). Patient  instructed to stop at the registration desk to finish any additional paperwork. Patient verbalized understanding. Pt left department w/ no further questions. 

## 2011-11-18 NOTE — ED Provider Notes (Signed)
History     CSN: [id number]  Arrival date & time 11/18/11  0212   First MD Initiated Contact with Patient 11/18/11 [phone]      Chief Complaint  Patient presents with  . Eye Problem    (Consider location/radiation/quality/duration/timing/severity/associated sxs/prior treatment) HPI Charles [name] is a 41 y.o. 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 Pain to both eyes that woke him from sleep. Unable to keep eyes open due to pain when looking or exposed to light. Watering, no purulent drainage. Has taken no medicines.   Past Medical History  Diagnosis Date  . COPD (chronic obstructive pulmonary disease)     Past Surgical History  Procedure Date  . Shoulder surgery   . Neck surgery     No family history on file.  History  Substance Use Topics  . Smoking status: Former Smoker -- 1.0 packs/day    Quit date: 08/06/2011  . Smokeless tobacco: Not on file  . Alcohol Use: Yes     Comment: frequently(only twice a week now as of today 03/18/11)      Review of Systems  Constitutional: Negative for fever.       10 Systems reviewed and are negative for acute change except as noted in the HPI.  HENT: Negative for congestion.   Eyes: Positive for photophobia and redness. Negative for discharge.       Bilateral eye pain  Respiratory: Negative for cough and shortness of breath.   Cardiovascular: Negative for chest pain.  Gastrointestinal: Negative for vomiting and abdominal pain.  Musculoskeletal: Negative for back pain.  Skin: Negative for rash.  Neurological: Negative for syncope, numbness and headaches.  Psychiatric/Behavioral:       No behavior change.    Allergies  Review of patient's allergies indicates no known allergies.  Home Medications   Current Outpatient Rx  Name  Route  Sig  Dispense  Refill  . HYDROCODONE-ACETAMINOPHEN  10-325 MG PO TABS   Oral   Take 1 tablet by mouth every 6 (six) hours as needed.         . IBUPROFEN 800 MG PO TABS   Oral   Take 800 mg by mouth every 8 (eight) hours as needed.         Marland Kitchen EPINEPHRINE BASE 0.22 MG/ACT IN AERS   Inhalation   Inhale into the lungs once.         Marland Kitchen HYDROCODONE-ACETAMINOPHEN 5-325 MG PO TABS   Oral   Take 1 tablet by mouth every 4 (four) hours as needed for pain.   10 tablet   0   . PREDNISONE 20 MG PO TABS   Oral   Take 3 tablets (60 mg total) by mouth daily.   15 tablet   0     BP 139/82  Pulse 82  Temp 97.7 F (36.5 C) (Oral)  Resp 20  Ht 6' (1.829 m)  Wt 195 lb (88.451 kg)  BMI 26.45 kg/m2  SpO2 99%  Physical Exam  Nursing note and vitals reviewed. Constitutional:       Awake, alert, nontoxic appearance.  HENT:  Head: Atraumatic.  Eyes: Right eye exhibits no discharge. Left eye exhibits no discharge.       Both eyes tearing, conjunctiva injected, tearing.  Neck: Normal range of motion. Neck supple.  Pulmonary/Chest: Effort normal. He exhibits no tenderness.  Abdominal: Soft.  There is no tenderness. There is no rebound.  Musculoskeletal: He exhibits no tenderness.       Baseline ROM, no obvious new focal weakness.  Neurological:       Mental status and motor strength appears baseline for patient and situation.  Skin: No rash noted.  Psychiatric: He has a normal mood and affect.    ED Course  Procedures (including critical care time)  Labs Reviewed - No data to display No results found.   1. 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 Pt stable in ED with no significant deterioration in condition.The patient appears reasonably screened and/or stabilized for discharge and I doubt any other medical condition or other [facility] requiring further screening,  evaluation, or treatment in the ED at this time prior to discharge.  MDM Reviewed: nursing note and vitals          Nicoletta Dress. Colon Branch, MD 11/18/11 [phone]

## 2011-11-18 NOTE — ED Notes (Signed)
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

## 2012-03-02 ENCOUNTER — Encounter (HOSPITAL_COMMUNITY): Payer: Self-pay

## 2012-03-02 ENCOUNTER — Emergency Department (HOSPITAL_COMMUNITY)
Admission: EM | Admit: 2012-03-02 | Discharge: 2012-03-02 | Disposition: A | Discharge status: Home / Self Care | Payer: 59 | Attending: Emergency Medicine | Admitting: Emergency Medicine

## 2012-03-02 DIAGNOSIS — Z87891 Personal history of nicotine dependence: Secondary | ICD-10-CM | POA: Insufficient documentation

## 2012-03-02 DIAGNOSIS — J4489 Other specified chronic obstructive pulmonary disease: Secondary | ICD-10-CM | POA: Insufficient documentation

## 2012-03-02 DIAGNOSIS — M549 Dorsalgia, unspecified: Secondary | ICD-10-CM

## 2012-03-02 DIAGNOSIS — Z79899 Other long term (current) drug therapy: Secondary | ICD-10-CM | POA: Insufficient documentation

## 2012-03-02 DIAGNOSIS — M545 Low back pain, unspecified: Secondary | ICD-10-CM | POA: Insufficient documentation

## 2012-03-02 DIAGNOSIS — J449 Chronic obstructive pulmonary disease, unspecified: Secondary | ICD-10-CM | POA: Insufficient documentation

## 2012-03-02 MED ORDER — METOPROLOL TARTRATE 1 MG/ML IV SOLN
INTRAVENOUS | Status: AC
  Filled 2012-03-02: qty 5

## 2012-03-02 MED ORDER — IBUPROFEN 800 MG PO TABS: 800.0000 mg | ORAL_TABLET | Freq: Three times a day (TID) | ORAL | Status: DC

## 2012-03-02 MED ORDER — HYDROCODONE-ACETAMINOPHEN 5-325 MG PO TABS: ORAL_TABLET | ORAL | Status: DC

## 2012-03-02 NOTE — ED Notes (Signed)
Pt c/o lower back pain that began early this morning. Pt states he has hx of "back problems" but has never been seen my MD for symptoms.

## 2012-03-02 NOTE — ED Provider Notes (Signed)
  Medical screening examination/treatment/procedure(s) were performed by non-physician practitioner and as supervising physician I was immediately available for consultation/collaboration.    Bonnita Newby D Arlen Dupuis, MD 03/02/12 1543 

## 2012-03-02 NOTE — ED Notes (Signed)
Pt reports history of lower back pain.  Reports woke up around 3am this morning c/o pain in lower back.

## 2012-03-02 NOTE — ED Provider Notes (Signed)
History     CSN: [id number]  Arrival date & time 03/02/12  1124   First MD Initiated Contact with Patient 03/02/12 1153      Chief Complaint  Patient presents with  . Back Pain    (Consider location/radiation/quality/duration/timing/severity/associated sxs/prior treatment) Patient is a 42 y.o. male presenting with back pain. The history is provided by the patient.  Back Pain Location:  Lumbar spine Quality:  Aching Pain severity:  Severe Pain is:  Same all the time Onset quality:  Sudden Timing:  Constant Progression:  Worsening Chronicity:  Chronic Relieved by:  Nothing Worsened by:  Movement, standing and twisting Ineffective treatments:  None tried Associated symptoms: no abdominal pain, no bladder incontinence, no bowel incontinence, no chest pain, no dysuria and no perianal numbness     Past Medical History  Diagnosis Date  . COPD (chronic obstructive pulmonary disease)     Past Surgical History  Procedure Laterality Date  . Shoulder surgery    . Neck surgery      No family history on file.  History  Substance Use Topics  . Smoking status: Former Smoker -- 1.00 packs/day    Quit date: 08/06/2011  . Smokeless tobacco: Not on file  . Alcohol Use: Yes     Comment: twice a week      Review of Systems  Constitutional: Negative for activity change.       All ROS Neg except as noted in HPI  HENT: Negative for nosebleeds and neck pain.   Eyes: Negative for photophobia and discharge.  Respiratory: Negative for cough, shortness of breath and wheezing.   Cardiovascular: Negative for chest pain and palpitations.  Gastrointestinal: Negative for abdominal pain, blood in stool and bowel incontinence.  Genitourinary: Negative for bladder incontinence, dysuria, frequency and hematuria.  Musculoskeletal: Positive for back pain. Negative for arthralgias.  Skin: Negative.   Neurological: Negative for dizziness, seizures and speech difficulty.   Psychiatric/Behavioral: Negative for hallucinations and confusion.    Allergies  Review of patient's allergies indicates no known allergies.  Home Medications   Current Outpatient Rx  Name  Route  Sig  Dispense  Refill  . EPINEPHrine Base (PRIMATENE MIST) 0.22 MG/ACT AERS   Inhalation   Inhale into the lungs once.         Marland Kitchen HYDROcodone-acetaminophen (NORCO) 10-325 MG per tablet   Oral   Take 1 tablet by mouth every 6 (six) hours as needed for pain.          Marland Kitchen ibuprofen (ADVIL,MOTRIN) 800 MG tablet   Oral   Take 800 mg by mouth every 8 (eight) hours as needed for pain.          Marland Kitchen omeprazole (PRILOSEC OTC) 20 MG tablet   Oral   Take 20 mg by mouth daily.           BP 132/73  Pulse 84  Temp(Src) 97.9 F (36.6 C) (Oral)  Resp 20  Ht 6' (1.829 m)  Wt 210 lb (95.255 kg)  BMI 28.47 kg/m2  SpO2 98%  Physical Exam  Nursing note and vitals reviewed. Constitutional: He is oriented to person, place, and time. He appears well-developed and well-nourished.  Non-toxic appearance.  HENT:  Head: Normocephalic.  Right Ear: Tympanic membrane and external ear normal.  Left Ear: Tympanic membrane and external ear normal.  Eyes: EOM and lids are normal. Pupils are equal, round, and reactive to light.  Neck: Normal range of motion. Neck supple. Carotid bruit is not  present.  Cardiovascular: Normal rate, regular rhythm, normal heart sounds, intact distal pulses and normal pulses.   Pulmonary/Chest: Breath sounds normal. No respiratory distress.  Few scattered wheezes appreciated.  Abdominal: Soft. Bowel sounds are normal. There is no tenderness. There is no guarding.  Musculoskeletal: Normal range of motion.  There is pain to palpation of the right paraspinal region of the lumbar area. There is no palpable step off of the lumbar area. There no hot areas appreciated. There is low back pain with straight leg raise on the right.  Lymphadenopathy:       Head (right side): No  submandibular adenopathy present.       Head (left side): No submandibular adenopathy present.    He has no cervical adenopathy.  Neurological: He is alert and oriented to person, place, and time. He has normal strength. No cranial nerve deficit or sensory deficit. He exhibits normal muscle tone. Coordination normal.  No foot drop. Gait steady.  Skin: Skin is warm and dry.  Psychiatric: He has a normal mood and affect. His speech is normal.    ED Course  Procedures (including critical care time)  Labs Reviewed - No data to display No results found.   No diagnosis found.    MDM  I have reviewed nursing notes, vital signs, and all appropriate lab and imaging results for this patient. Patient states he had a major injury to his neck and back approximately 6-7 years ago. States he's had problems with his back since that time, but has not been evaluated for these issues. This early a.m. he noted pain in his back that was more intense than the previous pains that he had been having. He states that he tried ibuprofen for this and this was not successful.  The examination reveals some right paraspinal area tenderness. As well as some pain in the lower lumbar area with change of position. No gross neurologic deficit is appreciated at this time. The gait is steady. There's been no loss of bowel or bladder function reported.  The plan at this time is for the patient to be treated with ibuprofen 800 mg 3 times daily with food, and Norco one or 2 tablets every 4 hours as needed for pain. Patient is also given the name of the orthopedist for additional evaluation and management of his ongoing back problems.       Kathie Dike, [place] 03/02/12 [phone]

## 2012-07-04 ENCOUNTER — Emergency Department (HOSPITAL_COMMUNITY)
Admission: EM | Admit: 2012-07-04 | Discharge: 2012-07-05 | Discharge status: Against Medical Advice | Payer: 59 | Attending: Emergency Medicine | Admitting: Emergency Medicine

## 2012-07-04 ENCOUNTER — Encounter (HOSPITAL_COMMUNITY): Payer: Self-pay | Admitting: Emergency Medicine

## 2012-07-04 DIAGNOSIS — J449 Chronic obstructive pulmonary disease, unspecified: Secondary | ICD-10-CM | POA: Insufficient documentation

## 2012-07-04 DIAGNOSIS — J4489 Other specified chronic obstructive pulmonary disease: Secondary | ICD-10-CM | POA: Insufficient documentation

## 2012-07-04 DIAGNOSIS — Z87891 Personal history of nicotine dependence: Secondary | ICD-10-CM | POA: Insufficient documentation

## 2012-07-04 DIAGNOSIS — R51 Headache: Secondary | ICD-10-CM | POA: Insufficient documentation

## 2012-07-04 LAB — CBC WITH DIFFERENTIAL/PLATELET
Basophils Relative: 0 % (ref 0–1)
Eosinophils Absolute: 0.1 10*3/uL (ref 0.0–0.7)
HCT: 42.4 % (ref 39.0–52.0)
Hemoglobin: 15.5 g/dL (ref 13.0–17.0)
MCH: 33.4 pg (ref 26.0–34.0)
MCHC: 36.6 g/dL — ABNORMAL HIGH (ref 30.0–36.0)
MCV: 91.4 fL (ref 78.0–100.0)
Monocytes Absolute: 0.6 10*3/uL (ref 0.1–1.0)
Monocytes Relative: 7 % (ref 3–12)

## 2012-07-04 LAB — COMPREHENSIVE METABOLIC PANEL
Albumin: 4.2 g/dL (ref 3.5–5.2)
BUN: 16 mg/dL (ref 6–23)
Creatinine, Ser: 0.93 mg/dL (ref 0.50–1.35)
GFR calc Af Amer: >90 mL/min (ref >90)
Total Bilirubin: 0.4 mg/dL (ref 0.3–1.2)
Total Protein: 7.2 g/dL (ref 6.0–8.3)

## 2012-07-04 NOTE — ED Notes (Addendum)
PT. REPORTS HEADACHE FOR SEVERAL WEEKS , GENERALIZED WEAKNESS WITH DIZZINESS FOR SEVERAL DAYS , AMBULATORY , DENIES FEVER OR CHILLS. ALERT AND ORIENTED . SPEECH CLEAR / EQUAL GRIPS . NO FACIAL ASYMMETRY.

## 2012-07-04 NOTE — ED Notes (Signed)
Pt called to room x1 w/no answer

## 2012-07-04 NOTE — ED Notes (Signed)
Called x2 for room placement w/no answer

## 2012-07-05 NOTE — ED Notes (Signed)
Called x3 for room w/no answer

## 2012-07-20 ENCOUNTER — Emergency Department (HOSPITAL_COMMUNITY): Payer: 59

## 2012-07-20 ENCOUNTER — Encounter (HOSPITAL_COMMUNITY): Payer: Self-pay | Admitting: *Deleted

## 2012-07-20 ENCOUNTER — Emergency Department (HOSPITAL_COMMUNITY)
Admission: EM | Admit: 2012-07-20 | Discharge: 2012-07-20 | Disposition: A | Discharge status: Home / Self Care | Payer: 59 | Attending: Emergency Medicine | Admitting: Emergency Medicine

## 2012-07-20 DIAGNOSIS — M549 Dorsalgia, unspecified: Secondary | ICD-10-CM

## 2012-07-20 DIAGNOSIS — F17200 Nicotine dependence, unspecified, uncomplicated: Secondary | ICD-10-CM | POA: Insufficient documentation

## 2012-07-20 DIAGNOSIS — J449 Chronic obstructive pulmonary disease, unspecified: Secondary | ICD-10-CM | POA: Insufficient documentation

## 2012-07-20 DIAGNOSIS — M545 Low back pain, unspecified: Secondary | ICD-10-CM | POA: Insufficient documentation

## 2012-07-20 DIAGNOSIS — J4489 Other specified chronic obstructive pulmonary disease: Secondary | ICD-10-CM | POA: Insufficient documentation

## 2012-07-20 LAB — URINALYSIS, ROUTINE W REFLEX MICROSCOPIC
Glucose, UA: NEGATIVE mg/dL
Leukocytes, UA: NEGATIVE
Nitrite: NEGATIVE
Protein, ur: NEGATIVE mg/dL
pH: 5 (ref 5.0–8.0)

## 2012-07-20 LAB — OCCULT BLOOD, POC DEVICE: Fecal Occult Bld: NEGATIVE

## 2012-07-20 MED ORDER — ONDANSETRON 8 MG PO TBDP
8.0000 mg | ORAL_TABLET | Freq: Once | ORAL | Status: AC
  Administered 2012-07-20: 8 mg via ORAL
  Filled 2012-07-20: qty 1

## 2012-07-20 MED ORDER — HYDROMORPHONE HCL PF 1 MG/ML IJ SOLN
1.0000 mg | Freq: Once | INTRAMUSCULAR | Status: AC
  Administered 2012-07-20: 1 mg via INTRAMUSCULAR
  Filled 2012-07-20: qty 1

## 2012-07-20 MED ORDER — PREDNISONE 10 MG PO TABS: ORAL_TABLET | ORAL | Status: DC

## 2012-07-20 MED ORDER — KETOROLAC TROMETHAMINE 60 MG/2ML IM SOLN
60.0000 mg | Freq: Once | INTRAMUSCULAR | Status: AC
  Administered 2012-07-20: 60 mg via INTRAMUSCULAR
  Filled 2012-07-20: qty 2

## 2012-07-20 MED ORDER — CYCLOBENZAPRINE HCL 10 MG PO TABS: 10.0000 mg | ORAL_TABLET | Freq: Three times a day (TID) | ORAL | Status: DC | PRN

## 2012-07-20 MED ORDER — CYCLOBENZAPRINE HCL 10 MG PO TABS
10.0000 mg | ORAL_TABLET | Freq: Once | ORAL | Status: AC
  Administered 2012-07-20: 10 mg via ORAL
  Filled 2012-07-20: qty 1

## 2012-07-20 NOTE — ED Notes (Signed)
Pt advised that he would be ready for discharge soon and would need to physically see a ride home, pt stated he was still trying to obtain a ride home.

## 2012-07-20 NOTE — ED Notes (Signed)
Pt reports low back pain since yesterday, denies any known injury, has similar pain in the past. Drove self to the ed today. Before given meds for pain was advised would need to get a driver home, pt stated he would.

## 2012-07-20 NOTE — ED Notes (Signed)
Lower back pain since yesterday evening. No known injury.

## 2012-07-20 NOTE — ED Provider Notes (Signed)
History    CSN: [id number] Arrival date & time 07/20/12  7829  First MD Initiated Contact with Patient 07/20/12 [phone]     Chief Complaint  Patient presents with  . Back Pain   (Consider location/radiation/quality/duration/timing/severity/associated sxs/prior Treatment) Patient is a 42 y.o. male presenting with back pain. The history is provided by the patient.  Back Pain Location:  Lumbar spine Quality:  Aching Radiates to:  Does not radiate Pain severity:  Moderate Pain is:  Same all the time Onset quality:  Gradual Duration:  1 day Timing:  Constant Progression:  Worsening Chronicity:  New Context comment:  Unknown injury Relieved by: sitting. Worsened by:  Ambulation, bending, standing and twisting Associated symptoms: no abdominal pain, no abdominal swelling, no bladder incontinence, no bowel incontinence, no chest pain, no dysuria, no fever, no headaches, no leg pain, no numbness, no paresthesias, no pelvic pain, no perianal numbness, no tingling and no weakness    Past Medical History  Diagnosis Date  . COPD (chronic obstructive pulmonary disease)    Past Surgical History  Procedure Laterality Date  . Shoulder surgery    . Neck surgery     No family history on file. History  Substance Use Topics  . Smoking status: Current Every Day Smoker -- 1.00 packs/day    Last Attempt to Quit: 08/06/2011  . Smokeless tobacco: Not on file  . Alcohol Use: Yes     Comment: twice a week    Review of Systems  Constitutional: Negative for fever.  Respiratory: Negative for shortness of breath.   Cardiovascular: Negative for chest pain.  Gastrointestinal: Negative for vomiting, abdominal pain, constipation and bowel incontinence.  Genitourinary: Negative for bladder incontinence, dysuria, hematuria, flank pain, decreased urine volume, difficulty urinating and pelvic pain.       No perineal numbness or incontinence of urine or feces  Musculoskeletal: Positive for back pain.  Negative for joint swelling.  Skin: Negative for rash.  Neurological: Negative for tingling, weakness, numbness, headaches and paresthesias.  All other systems reviewed and are negative.    Allergies  Review of patient's allergies indicates no known allergies.  Home Medications   Current Outpatient Rx  Name  Route  Sig  Dispense  Refill  . HYDROcodone-acetaminophen (NORCO) 10-325 MG per tablet   Oral   Take 1 tablet by mouth every 6 (six) hours as needed for pain.          Marland Kitchen ibuprofen (ADVIL,MOTRIN) 800 MG tablet   Oral   Take 800 mg by mouth every 8 (eight) hours as needed for pain.           BP 153/80  Pulse 81  Temp(Src) 98.5 F (36.9 C) (Oral)  Resp 16  Ht 6' (1.829 m)  Wt 206 lb (93.441 kg)  BMI 27.93 kg/m2  SpO2 96% Physical Exam  Nursing note and vitals reviewed. Constitutional: He is oriented to person, place, and time. He appears well-developed and well-nourished. No distress.  HENT:  Head: Normocephalic and atraumatic.  Neck: Normal range of motion. Neck supple.  Cardiovascular: Normal rate, regular rhythm, normal heart sounds and intact distal pulses.   No murmur heard. Pulmonary/Chest: Effort normal and breath sounds normal. No respiratory distress.  Abdominal: Soft. He exhibits no distension. There is no tenderness. There is no rebound and no guarding.  Genitourinary: Rectal exam shows no external hemorrhoid, no fissure, no mass, no tenderness and anal tone normal. Guaiac negative stool.  Musculoskeletal: He exhibits tenderness. He exhibits no  edema.       Lumbar back: He exhibits tenderness, bony tenderness and pain. He exhibits normal range of motion, no swelling, no deformity, no laceration and normal pulse.       Back:  ttp of the lower lumbar spine, paraspinal muscles and SI joints.  DP pulses are brisk and symmetrical.  Distal sensation intact.  Hip Flexors/Extensors are intact.  Pain reproduced with bilateral SLR  Neurological: He is alert and  oriented to person, place, and time. No cranial nerve deficit or sensory deficit. He exhibits normal muscle tone. Coordination and gait normal.  Reflex Scores:      Patellar reflexes are 2+ on the right side and 2+ on the left side.      Achilles reflexes are 2+ on the right side and 2+ on the left side. Skin: Skin is warm and dry.    ED Course  Procedures (including critical care time) Labs Reviewed  URINALYSIS, ROUTINE W REFLEX MICROSCOPIC - Abnormal; Notable for the following:    Specific Gravity, Urine <1.005 (*)    All other components within normal limits   Dg Lumbar Spine Complete  07/20/2012   *RADIOLOGY REPORT*  Clinical Data: Back pain.  LUMBAR SPINE - COMPLETE 4+ VIEW  Comparison: None  Findings: Moderate degenerative lumbar spondylosis with multilevel disc disease and facet disease.  There is reversal of the normal lumbar lordosis.  No acute bony findings or destructive bony changes.  No definite pars defects.  The visualized bony pelvis is intact.  IMPRESSION:  1.  Degenerative lumbar spondylosis with disc disease and facet disease somewhat advanced for age. 2.  No acute bony findings.   Original Report Authenticated By: Rudie Meyer, M.D.     MDM    Patient has ttp of the lumbar spine and paraspinal muscles.  No focal neuro deficits on exam.  Ambulates with a steady gait.   Lumbar spondylosis on x-ray, negative urinalysis.  Pt agrees to close f/u with his PMD.  Doubt emergent neurological or infectious process.  Patient feels better, appears stable for discharge.  Trashawn Oquendo L. Trisha Mangle, PA-C 07/21/12 2223

## 2012-07-24 NOTE — ED Provider Notes (Signed)
Medical screening examination/treatment/procedure(s) were performed by non-physician practitioner and as supervising physician I was immediately available for consultation/collaboration.  Amerie Beaumont B. Bernette Mayers, MD 07/24/12 1655

## 2012-09-19 ENCOUNTER — Encounter (HOSPITAL_COMMUNITY): Payer: Self-pay | Admitting: *Deleted

## 2012-09-19 ENCOUNTER — Emergency Department (HOSPITAL_COMMUNITY)
Admission: EM | Admit: 2012-09-19 | Discharge: 2012-09-19 | Disposition: A | Discharge status: Home / Self Care | Payer: 59 | Attending: Emergency Medicine | Admitting: Emergency Medicine

## 2012-09-19 DIAGNOSIS — Y9289 Other specified places as the place of occurrence of the external cause: Secondary | ICD-10-CM | POA: Insufficient documentation

## 2012-09-19 DIAGNOSIS — S058X9A Other injuries of unspecified eye and orbit, initial encounter: Secondary | ICD-10-CM | POA: Insufficient documentation

## 2012-09-19 DIAGNOSIS — IMO0002 Reserved for concepts with insufficient information to code with codable children: Secondary | ICD-10-CM | POA: Insufficient documentation

## 2012-09-19 DIAGNOSIS — J4489 Other specified chronic obstructive pulmonary disease: Secondary | ICD-10-CM | POA: Insufficient documentation

## 2012-09-19 DIAGNOSIS — F17200 Nicotine dependence, unspecified, uncomplicated: Secondary | ICD-10-CM | POA: Insufficient documentation

## 2012-09-19 DIAGNOSIS — J449 Chronic obstructive pulmonary disease, unspecified: Secondary | ICD-10-CM | POA: Insufficient documentation

## 2012-09-19 DIAGNOSIS — S0502XA Injury of conjunctiva and corneal abrasion without foreign body, left eye, initial encounter: Secondary | ICD-10-CM

## 2012-09-19 DIAGNOSIS — Y9389 Activity, other specified: Secondary | ICD-10-CM | POA: Insufficient documentation

## 2012-09-19 MED ORDER — FLUORESCEIN SODIUM 1 MG OP STRP
ORAL_STRIP | OPHTHALMIC | Status: AC
  Filled 2012-09-19: qty 1

## 2012-09-19 MED ORDER — TOBRAMYCIN 0.3 % OP SOLN
1.0000 [drp] | Freq: Once | OPHTHALMIC | Status: AC
  Administered 2012-09-19: 1 [drp] via OPHTHALMIC
  Filled 2012-09-19: qty 5

## 2012-09-19 MED ORDER — KETOROLAC TROMETHAMINE 0.5 % OP SOLN
1.0000 [drp] | Freq: Four times a day (QID) | OPHTHALMIC | Status: DC
  Administered 2012-09-19: 1 [drp] via OPHTHALMIC
  Filled 2012-09-19: qty 5

## 2012-09-19 MED ORDER — TETRACAINE HCL 0.5 % OP SOLN
2.0000 [drp] | Freq: Once | OPHTHALMIC | Status: AC
  Administered 2012-09-19: 2 [drp] via OPHTHALMIC
  Filled 2012-09-19: qty 2

## 2012-09-19 NOTE — ED Notes (Signed)
fb in lt eye, while working under his truck.

## 2012-09-19 NOTE — ED Notes (Signed)
Pt working on truck this morning around noon, lying on his back underneath and a small piece of debris fell into his eye.  No obvious trauma upon initial inspection and no debris noted.

## 2012-09-19 NOTE — ED Provider Notes (Signed)
CSN: [id number]     Arrival date & time 09/19/12  1327 History   First MD Initiated Contact with Patient 09/19/12 1413     Chief Complaint  Patient presents with  . Foreign Body in Eye   (Consider location/radiation/quality/duration/timing/severity/associated sxs/prior Treatment) HPI Comments: Charles [name] is a 42 y.o. male who presents to the Emergency Department complaining of  Pain and foreign body sensation to the left eye.  States the symptoms began suddenly while working underneath his truck.  Thinks he may have gotten a piece of metal in his eye.  He states that he washed his eye with an over counter eye wash solution prior to ED arrival.  He denies recent illness, vomiting, headache, dizziness.  Also states that he does not wear glasses or contacts.    Patient is a 42 y.o. male presenting with foreign body in eye. The history is provided by the patient.  Foreign Body in Eye This is a new problem. The current episode started today. The problem occurs constantly. The problem has been unchanged. Associated symptoms include a visual change. Pertinent negatives include no arthralgias, chest pain, congestion, fever, headaches, neck pain, numbness, rash, sore throat, swollen glands, urinary symptoms, vertigo, vomiting or weakness. Exacerbated by: sunlight , blinking. Treatments tried: rinsing his eyes with OTC eye wash solution. The treatment provided no relief.    Past Medical History  Diagnosis Date  . COPD (chronic obstructive pulmonary disease)    Past Surgical History  Procedure Laterality Date  . Shoulder surgery    . Neck surgery     History reviewed. No pertinent family history. History  Substance Use Topics  . Smoking status: Current Every Day Smoker -- 1.00 packs/day    Last Attempt to Quit: 08/06/2011  . Smokeless tobacco: Not on file  . Alcohol Use: Yes     Comment: twice a week    Review of Systems  Constitutional: Negative for fever.  HENT: Negative for congestion,  sore throat and neck pain.   Eyes: Positive for photophobia, pain, redness and visual disturbance. Negative for discharge and itching.  Respiratory: Negative for chest tightness and shortness of breath.   Cardiovascular: Negative for chest pain.  Gastrointestinal: Negative for vomiting.  Musculoskeletal: Negative for arthralgias.  Skin: Negative for rash.  Neurological: Negative for dizziness, vertigo, syncope, facial asymmetry, weakness, numbness and headaches.  Hematological: Negative for adenopathy.  All other systems reviewed and are negative.    Allergies  Review of patient's allergies indicates no known allergies.  Home Medications   Current Outpatient Rx  Name  Route  Sig  Dispense  Refill  . cyclobenzaprine (FLEXERIL) 10 MG tablet   Oral   Take 1 tablet (10 mg total) by mouth 3 (three) times daily as needed for muscle spasms.   21 tablet   0   . HYDROcodone-acetaminophen (NORCO) 10-325 MG per tablet   Oral   Take 1 tablet by mouth every 6 (six) hours as needed for pain.          Marland Kitchen ibuprofen (ADVIL,MOTRIN) 800 MG tablet   Oral   Take 800 mg by mouth every 8 (eight) hours as needed for pain.          . predniSONE (DELTASONE) 10 MG tablet      Take 6 tablets day one, 5 tablets day two, 4 tablets day three, 3 tablets day four, 2 tablets day five, then 1 tablet day six   21 tablet   0  BP 141/81  Pulse 81  Temp(Src) 98.2 F (36.8 C) (Oral)  Resp 18  Ht 6' (1.829 m)  Wt 212 lb (96.163 kg)  BMI 28.75 kg/m2  SpO2 96% Physical Exam  Nursing note and vitals reviewed. Constitutional: He is oriented to person, place, and time. He appears well-developed and well-nourished. No distress.  HENT:  Head: Normocephalic and atraumatic.  Eyes: EOM and lids are normal. Pupils are equal, round, and reactive to light. Lids are everted and swept, no foreign bodies found. Left eye exhibits no chemosis, no discharge, no exudate and no hordeolum. No foreign body present in  the left eye. Right conjunctiva is not injected. Left eye exhibits normal extraocular motion and no nystagmus.  Slit lamp exam:      The left eye shows corneal abrasion and fluorescein uptake. The left eye shows no corneal flare, no corneal ulcer, no foreign body and no hyphema.  Slit lamp exam reveals pin point area of fluorescein uptake at the 5 o'clock position over the left pupil.  Negative Seidel's sign.  No FB's seen  Neck: Normal range of motion. Neck supple. No thyromegaly present.  Cardiovascular: Normal rate, regular rhythm, normal heart sounds and intact distal pulses.   No murmur heard. Pulmonary/Chest: Effort normal and breath sounds normal. No respiratory distress.  Musculoskeletal: Normal range of motion.  Lymphadenopathy:    He has no cervical adenopathy.  Neurological: He is alert and oriented to person, place, and time. He exhibits normal muscle tone. Coordination normal.  Skin: Skin is warm and dry. No rash noted.    ED Course  Procedures (including critical care time) Labs Review Labs Reviewed - No data to display Imaging Review No results found.  MDM    Visual acuity :  Bilateral Distance: 20/40 ; R Distance: 20/40 ; L Distance: 20/50  Patient with a small corneal abrasion to left eye.  Pain improved after application of tetracaine.  Tobramycin and Ketorolac ophtho drops applied and dispensed for home use.  Pt agrees to avoid eye strain and close f/u with Dr. Lita Mains , referral given.    Patient also evaluated by EDP, Dr. Hyacinth Meeker.  Care plan discussed.  VSS.  Patient appears stable for discharge.  Maiah Sinning L. Trisha Mangle, PA-C 09/19/12 2010

## 2012-09-20 NOTE — ED Provider Notes (Signed)
42 year old male presents with left eye pain and redness. This occurred after he was working underneath his motor vehicle, rub his eye after feeling like something got into it. Since that time he has had pain in that eye. On my exam he has a corneal abrasion in the center of the left cornea, Seidel test is negative, minimal erythema to the conjunctiva, he can be treated with medications for corneal abrasion including topical antibiotics and pain medications. I have instructed the patient not to rub his eyes or to use contact lenses and to followup with ophthalmology within 24-48 hours. He has expressed his understanding.  Medical screening examination/treatment/procedure(s) were conducted as a shared visit with non-physician practitioner(s) and myself.  I personally evaluated the patient during the encounter.  Clinical Impression: Corneal abrasion left      Vida Roller, MD 09/20/12 [phone]

## 2013-02-01 ENCOUNTER — Emergency Department (HOSPITAL_COMMUNITY)
Admission: EM | Admit: 2013-02-01 | Discharge: 2013-02-01 | Disposition: A | Discharge status: Home / Self Care | Payer: Managed Care, Other (non HMO) | Attending: Emergency Medicine | Admitting: Emergency Medicine

## 2013-02-01 ENCOUNTER — Encounter (HOSPITAL_COMMUNITY): Payer: Self-pay | Admitting: Emergency Medicine

## 2013-02-01 DIAGNOSIS — R109 Unspecified abdominal pain: Secondary | ICD-10-CM | POA: Insufficient documentation

## 2013-02-01 DIAGNOSIS — J3489 Other specified disorders of nose and nasal sinuses: Secondary | ICD-10-CM | POA: Insufficient documentation

## 2013-02-01 DIAGNOSIS — J4489 Other specified chronic obstructive pulmonary disease: Secondary | ICD-10-CM | POA: Insufficient documentation

## 2013-02-01 DIAGNOSIS — R110 Nausea: Secondary | ICD-10-CM | POA: Insufficient documentation

## 2013-02-01 DIAGNOSIS — J111 Influenza due to unidentified influenza virus with other respiratory manifestations: Secondary | ICD-10-CM | POA: Insufficient documentation

## 2013-02-01 DIAGNOSIS — Z87891 Personal history of nicotine dependence: Secondary | ICD-10-CM | POA: Insufficient documentation

## 2013-02-01 DIAGNOSIS — R69 Illness, unspecified: Secondary | ICD-10-CM

## 2013-02-01 DIAGNOSIS — J449 Chronic obstructive pulmonary disease, unspecified: Secondary | ICD-10-CM | POA: Insufficient documentation

## 2013-02-01 MED ORDER — OSELTAMIVIR PHOSPHATE 75 MG PO CAPS: 75.0000 mg | ORAL_CAPSULE | Freq: Two times a day (BID) | ORAL | Status: DC

## 2013-02-01 MED ORDER — ONDANSETRON 8 MG PO TBDP: 8.0000 mg | ORAL_TABLET | Freq: Three times a day (TID) | ORAL | Status: DC | PRN

## 2013-02-01 NOTE — ED Provider Notes (Signed)
CSN: [id number]     Arrival date & time 02/01/13  1543 History   First MD Initiated Contact with Patient 02/01/13 1806     Chief Complaint  Patient presents with  . Abdominal Pain  . Cough   (Consider location/radiation/quality/duration/timing/severity/associated sxs/prior Treatment) HPI 43 year old male who presents today complaining of one to 2 days of sore throat, cough, congestion, nausea, and diarrhea. He has been taking by mouth without difficulty. He has had 2-3 episodes of loose stool. He has not been dyspneic but has coughed up some discolored phlegm. He denies any past medical history to me but it is listed that he has COPD. He denies taking any daily medications. He does not currently smoke cigarettes but uses a vaporized nicotine. He has been urinating as usual.  Past Medical History  Diagnosis Date  . COPD (chronic obstructive pulmonary disease)    Past Surgical History  Procedure Laterality Date  . Shoulder surgery    . Neck surgery     Family History  Problem Relation Age of Onset  . Heart failure Mother   . Diabetes Mother   . Hypertension Mother   . Cancer Father   . Heart failure Father    History  Substance Use Topics  . Smoking status: Former Smoker -- 1.00 packs/day    Types: Cigarettes    Quit date: 08/06/2011  . Smokeless tobacco: Not on file  . Alcohol Use: 3.6 oz/week    6 Cans of beer per week     Comment: twice a week    Review of Systems  All other systems reviewed and are negative.    Allergies  Review of patient's allergies indicates no known allergies.  Home Medications   Current Outpatient Rx  Name  Route  Sig  Dispense  Refill  . albuterol (PROVENTIL HFA;VENTOLIN HFA) 108 (90 BASE) MCG/ACT inhaler   Inhalation   Inhale 2 puffs into the lungs every 6 (six) hours as needed for wheezing or shortness of breath.         Marland Kitchen HYDROcodone-acetaminophen (NORCO) 10-325 MG per tablet   Oral   Take 1 tablet by mouth every 6 (six) hours as  needed for pain.          Marland Kitchen ibuprofen (ADVIL,MOTRIN) 800 MG tablet   Oral   Take 800 mg by mouth every 8 (eight) hours as needed.          BP 133/79  Pulse 74  Temp(Src) 97.4 F (36.3 C) (Oral)  Resp 16  Ht 6' (1.829 m)  Wt 215 lb (97.523 kg)  BMI 29.15 kg/m2  SpO2 98% Physical Exam  Nursing note and vitals reviewed. Constitutional: He is oriented to person, place, and time. He appears well-developed and well-nourished.  HENT:  Head: Normocephalic and atraumatic.  Right Ear: External ear normal.  Left Ear: External ear normal.  Nose: Nose normal.  Mouth/Throat: Oropharynx is clear and moist.  Eyes: Conjunctivae and EOM are normal. Pupils are equal, round, and reactive to light.  Neck: Normal range of motion. Neck supple.  Cardiovascular: Normal rate, regular rhythm, normal heart sounds and intact distal pulses.   Pulmonary/Chest: Effort normal and breath sounds normal. No respiratory distress. He has no wheezes. He exhibits no tenderness.  Abdominal: Soft. Bowel sounds are normal. He exhibits no distension and no mass. There is no tenderness. There is no guarding.  Musculoskeletal: Normal range of motion.  Neurological: He is alert and oriented to person, place, and time. He has  normal reflexes. He exhibits normal muscle tone. Coordination normal.  Skin: Skin is warm and dry.  Psychiatric: He has a normal mood and affect. His behavior is normal. Judgment and thought content normal.    ED Course  Procedures (including critical care time) Labs Review Labs Reviewed - No data to display Imaging Review No results found.  EKG Interpretation   None       MDM  No diagnosis found. 43 year old male with influenza-like symptoms. He will be started on Tamiflu. He is given Zofran for nausea. He is advised against using nicotine paper. He is given return precautions and will be given a work note.    Shaune Pollack, MD 02/01/13 [phone]

## 2013-02-01 NOTE — Discharge Instructions (Signed)
Influenza, Adult °Influenza (flu) is an infection in the mouth, nose, and throat (respiratory tract) caused by a virus. The flu can make you feel very ill. Influenza spreads easily from person to person (contagious).  °HOME CARE  °· Only take medicines as told by your doctor. °· Use a cool mist humidifier to make breathing easier. °· Get plenty of rest until your fever goes away. This usually takes 3 to 4 days. °· Drink enough fluids to keep your pee (urine) clear or pale yellow. °· Cover your mouth and nose when you cough or sneeze. °· Wash your hands well to avoid spreading the flu. °· Stay home from work or school until your fever has been gone for at least 1 full day. °· Get a flu shot every year. °GET HELP RIGHT AWAY IF:  °· You have trouble breathing or feel short of breath. °· Your skin or nails turn blue. °· You have severe neck pain or stiffness. °· You have a severe headache, facial pain, or earache. °· Your fever gets worse or keeps coming back. °· You feel sick to your stomach (nauseous), throw up (vomit), or have watery poop (diarrhea). °· You have chest pain. °· You have a deep cough that gets worse, or you cough up more thick spit (mucus). °MAKE SURE YOU:  °· Understand these instructions. °· Will watch your condition. °· Will get help right away if you are not doing well or get worse. °Document Released: 10/01/2007 Document Revised: 06/23/2011 Document Reviewed: 03/23/2011 °[employer]® Patient Information ©2014 [employer]. ° °

## 2013-02-01 NOTE — ED Notes (Signed)
Pt reports watery diarrhea, cough, body aches, chest discomfort and malaise since Tues.

## 2013-04-26 ENCOUNTER — Emergency Department (HOSPITAL_COMMUNITY): Payer: Managed Care, Other (non HMO)

## 2013-04-26 ENCOUNTER — Emergency Department (HOSPITAL_COMMUNITY)
Admission: EM | Admit: 2013-04-26 | Discharge: 2013-04-26 | Disposition: A | Discharge status: Home / Self Care | Payer: Managed Care, Other (non HMO) | Attending: Emergency Medicine | Admitting: Emergency Medicine

## 2013-04-26 ENCOUNTER — Encounter (HOSPITAL_COMMUNITY): Payer: Self-pay | Admitting: Emergency Medicine

## 2013-04-26 DIAGNOSIS — J449 Chronic obstructive pulmonary disease, unspecified: Secondary | ICD-10-CM | POA: Insufficient documentation

## 2013-04-26 DIAGNOSIS — R079 Chest pain, unspecified: Secondary | ICD-10-CM | POA: Insufficient documentation

## 2013-04-26 DIAGNOSIS — M129 Arthropathy, unspecified: Secondary | ICD-10-CM | POA: Insufficient documentation

## 2013-04-26 DIAGNOSIS — IMO0002 Reserved for concepts with insufficient information to code with codable children: Secondary | ICD-10-CM | POA: Insufficient documentation

## 2013-04-26 DIAGNOSIS — Z87891 Personal history of nicotine dependence: Secondary | ICD-10-CM | POA: Insufficient documentation

## 2013-04-26 DIAGNOSIS — R110 Nausea: Secondary | ICD-10-CM | POA: Insufficient documentation

## 2013-04-26 DIAGNOSIS — J4489 Other specified chronic obstructive pulmonary disease: Secondary | ICD-10-CM | POA: Insufficient documentation

## 2013-04-26 DIAGNOSIS — Z79899 Other long term (current) drug therapy: Secondary | ICD-10-CM | POA: Insufficient documentation

## 2013-04-26 LAB — BASIC METABOLIC PANEL
BUN: 26 mg/dL — AB (ref 6–23)
CHLORIDE: 100 meq/L (ref 96–112)
CO2: 24 meq/L (ref 19–32)
CREATININE: 0.94 mg/dL (ref 0.50–1.35)
Calcium: 10.1 mg/dL (ref 8.4–10.5)
GFR calc Af Amer: >90 mL/min (ref >90)
GFR calc non Af Amer: >90 mL/min (ref >90)
Glucose, Bld: 115 mg/dL — ABNORMAL HIGH (ref 70–99)
Potassium: 4 mEq/L (ref 3.7–5.3)
Sodium: 138 mEq/L (ref 137–147)

## 2013-04-26 LAB — CBC
HEMATOCRIT: 43.2 % (ref 39.0–52.0)
Hemoglobin: 15 g/dL (ref 13.0–17.0)
MCH: 32.4 pg (ref 26.0–34.0)
MCHC: 34.7 g/dL (ref 30.0–36.0)
MCV: 93.3 fL (ref 78.0–100.0)
Platelets: 168 10*3/uL (ref 150–400)
RBC: 4.63 MIL/uL (ref 4.22–5.81)
RDW: 12.3 % (ref 11.5–15.5)
WBC: 7.1 10*3/uL (ref 4.0–10.5)

## 2013-04-26 LAB — HEPATIC FUNCTION PANEL
ALK PHOS: 47 U/L (ref 39–117)
ALT: 19 U/L (ref 0–53)
AST: 21 U/L (ref 0–37)
Albumin: 4.7 g/dL (ref 3.5–5.2)
Bilirubin, Direct: <0.2 mg/dL (ref 0.0–0.3)
Total Bilirubin: 0.9 mg/dL (ref 0.3–1.2)
Total Protein: 7.5 g/dL (ref 6.0–8.3)

## 2013-04-26 LAB — LIPASE, BLOOD: LIPASE: 26 U/L (ref 11–59)

## 2013-04-26 LAB — TROPONIN I
Troponin I: <0.3 ng/mL (ref <0.30)
Troponin I: <0.3 ng/mL (ref <0.30)

## 2013-04-26 LAB — D-DIMER, QUANTITATIVE (NOT AT ARMC)

## 2013-04-26 MED ORDER — KETOROLAC TROMETHAMINE 30 MG/ML IJ SOLN
30.0000 mg | Freq: Once | INTRAMUSCULAR | Status: AC
  Administered 2013-04-26: 30 mg via INTRAVENOUS
  Filled 2013-04-26: qty 1

## 2013-04-26 MED ORDER — HYDROCODONE-ACETAMINOPHEN 10-325 MG PO TABS: 1.0000 | ORAL_TABLET | Freq: Four times a day (QID) | ORAL | Status: DC | PRN

## 2013-04-26 MED ORDER — HYDROMORPHONE HCL PF 1 MG/ML IJ SOLN
1.0000 mg | Freq: Once | INTRAMUSCULAR | Status: AC
  Administered 2013-04-26: 1 mg via INTRAVENOUS
  Filled 2013-04-26: qty 1

## 2013-04-26 MED ORDER — METHYLPREDNISOLONE SODIUM SUCC 125 MG IJ SOLR
125.0000 mg | INTRAMUSCULAR | Status: AC
  Administered 2013-04-26: 125 mg via INTRAVENOUS
  Filled 2013-04-26: qty 2

## 2013-04-26 MED ORDER — FENTANYL CITRATE 0.05 MG/ML IJ SOLN
50.0000 ug | Freq: Once | INTRAMUSCULAR | Status: AC
  Administered 2013-04-26: 50 ug via INTRAVENOUS
  Filled 2013-04-26: qty 2

## 2013-04-26 MED ORDER — PREDNISONE 20 MG PO TABS: 40.0000 mg | ORAL_TABLET | Freq: Every day | ORAL | Status: AC

## 2013-04-26 MED ORDER — ONDANSETRON HCL 4 MG/2ML IJ SOLN
4.0000 mg | Freq: Once | INTRAMUSCULAR | Status: AC
  Administered 2013-04-26: 4 mg via INTRAVENOUS
  Filled 2013-04-26: qty 2

## 2013-04-26 MED ORDER — ASPIRIN 81 MG PO CHEW
324.0000 mg | CHEWABLE_TABLET | Freq: Once | ORAL | Status: AC
  Administered 2013-04-26: 324 mg via ORAL
  Filled 2013-04-26: qty 4

## 2013-04-26 MED ORDER — SODIUM CHLORIDE 0.9 % IV SOLN
Freq: Once | INTRAVENOUS | Status: AC
  Administered 2013-04-26: 11:00:00 via INTRAVENOUS

## 2013-04-26 MED ORDER — HYDROMORPHONE HCL PF 1 MG/ML IJ SOLN: 1.0000 mg | Freq: Once | INTRAMUSCULAR | Status: DC

## 2013-04-26 MED ORDER — FAMOTIDINE IN NACL 20-0.9 MG/50ML-% IV SOLN
20.0000 mg | Freq: Once | INTRAVENOUS | Status: AC
  Administered 2013-04-26: 20 mg via INTRAVENOUS
  Filled 2013-04-26: qty 50

## 2013-04-26 MED ORDER — GI COCKTAIL ~~LOC~~
30.0000 mL | Freq: Once | ORAL | Status: AC
Start: 2013-04-26 — End: 2013-04-26
  Administered 2013-04-26: 30 mL via ORAL
  Filled 2013-04-26: qty 30

## 2013-04-26 MED ORDER — IBUPROFEN 800 MG PO TABS: 800.0000 mg | ORAL_TABLET | Freq: Three times a day (TID) | ORAL | Status: DC

## 2013-04-26 MED ORDER — MORPHINE SULFATE 4 MG/ML IJ SOLN
4.0000 mg | Freq: Once | INTRAMUSCULAR | Status: AC
  Administered 2013-04-26: 4 mg via INTRAVENOUS
  Filled 2013-04-26: qty 1

## 2013-04-26 NOTE — ED Notes (Signed)
Left sided cp with left arm numbness, bil jaw pain, weakness and nausea x 2 days.  Denies sob.  Reports was bitten by a tick also x 1 1/2 wks ago.

## 2013-04-26 NOTE — Discharge Instructions (Signed)
As discussed, your evaluation today has been largely reassuring.  But, it is important that you monitor your condition carefully, and do not hesitate to return to the ED if you develop new, or concerning changes in your condition.  Otherwise, please follow-up with your physician for appropriate ongoing care.  You should be   sure to discuss today's presentation, however you do tonight with new medication, and other possible causes for your discomfort.   Chest Pain (Nonspecific) It is often hard to give a specific diagnosis for the cause of chest pain. There is always a chance that your pain could be related to something serious, such as a heart attack or a blood clot in the lungs. You need to follow up with your caregiver for further evaluation. CAUSES   Heartburn.  Pneumonia or bronchitis.  Anxiety or stress.  Inflammation around your heart (pericarditis) or lung (pleuritis or pleurisy).  A blood clot in the lung.  A collapsed lung (pneumothorax). It can develop suddenly on its own (spontaneous pneumothorax) or from injury (trauma) to the chest.  Shingles infection (herpes zoster virus). The chest wall is composed of bones, muscles, and cartilage. Any of these can be the source of the pain.  The bones can be bruised by injury.  The muscles or cartilage can be strained by coughing or overwork.  The cartilage can be affected by inflammation and become sore (costochondritis). DIAGNOSIS  Lab tests or other studies, such as X-rays, electrocardiography, stress testing, or cardiac imaging, may be needed to find the cause of your pain.  TREATMENT   Treatment depends on what may be causing your chest pain. Treatment may include:  Acid blockers for heartburn.  Anti-inflammatory medicine.  Pain medicine for inflammatory conditions.  Antibiotics if an infection is present.  You may be advised to change lifestyle habits. This includes stopping smoking and avoiding alcohol, caffeine,  and chocolate.  You may be advised to keep your head raised (elevated) when sleeping. This reduces the chance of acid going backward from your stomach into your esophagus.  Most of the time, nonspecific chest pain will improve within 2 to 3 days with rest and mild pain medicine. HOME CARE INSTRUCTIONS   If antibiotics were prescribed, take your antibiotics as directed. Finish them even if you start to feel better.  For the next few days, avoid physical activities that bring on chest pain. Continue physical activities as directed.  Do not smoke.  Avoid drinking alcohol.  Only take over-the-counter or prescription medicine for pain, discomfort, or fever as directed by your caregiver.  Follow your caregiver's suggestions for further testing if your chest pain does not go away.  Keep any follow-up appointments you made. If you do not go to an appointment, you could develop lasting (chronic) problems with pain. If there is any problem keeping an appointment, you must call to reschedule. SEEK MEDICAL CARE IF:   You think you are having problems from the medicine you are taking. Read your medicine instructions carefully.  Your chest pain does not go away, even after treatment.  You develop a rash with blisters on your chest. SEEK IMMEDIATE MEDICAL CARE IF:   You have increased chest pain or pain that spreads to your arm, neck, jaw, back, or abdomen.  You develop shortness of breath, an increasing cough, or you are coughing up blood.  You have severe back or abdominal pain, feel nauseous, or vomit.  You develop severe weakness, fainting, or chills.  You have a fever.  THIS IS AN EMERGENCY. Do not wait to see if the pain will go away. Get medical help at once. Call your local emergency services (911 in [place] Do not drive yourself to the hospital. MAKE SURE YOU:   Understand these instructions.  Will watch your condition.  Will get help right away if you are not doing well or get  worse. Document Released: 10/01/2004 Document Revised: 03/16/2011 Document Reviewed: 07/28/2007 [facility] Patient Information 2014 [place].

## 2013-04-26 NOTE — ED Provider Notes (Signed)
CSN: [id number]     Arrival date & time 04/26/13  0945 History   This chart was scribed for Charles Muskrat, MD by Era Bumpers, ED scribe. This patient was seen in room APA14/APA14 and the patient's care was started at [facility].  Chief Complaint  Patient presents with  . Chest Pain   The history is provided by the patient. No language interpreter was used.   HPI Comments: Charles [name] is a 43 y.o. male who presents to the Emergency Department for left sided, constant, non-radiating chest pain, onset x3 days ago. He reports associated nausea and intermittent SOB. He was doing fine before x3 days ago. He is generally a healthy guy but has a hx of mild COPD. He is supposed to take prednisone for arthritis. He denies pain w/deep breath or increased SOB w/exertion such as walking up the stairs. He states some jaw stiffness bilaterally which began last PM and is still somewhat stiff. He denies emesis, diarrhea, fever/chills, abdominal pain, syncope, confusion, change in appetite or any new medicines.   He has no allergies to medicines. He quit smoking. He drinks beer a few beers to six beers once or twice a week. No change in amt/frequency of alcohol consumption.   Past Medical History  Diagnosis Date  . COPD (chronic obstructive pulmonary disease)    Past Surgical History  Procedure Laterality Date  . Shoulder surgery    . Neck surgery     Family History  Problem Relation Age of Onset  . Heart failure Mother   . Diabetes Mother   . Hypertension Mother   . Cancer Father   . Heart failure Father    History  Substance Use Topics  . Smoking status: Former Smoker -- 1.00 packs/day    Types: Cigarettes    Quit date: 08/06/2011  . Smokeless tobacco: Not on file  . Alcohol Use: 3.6 oz/week    6 Cans of beer per week     Comment: twice a week    Review of Systems  Constitutional: Negative for fever and chills.  Respiratory: Negative for cough and shortness of breath.   Cardiovascular:  Positive for chest pain.  Gastrointestinal: Positive for nausea. Negative for vomiting.  Musculoskeletal: Negative for back pain.  Skin: Negative for rash.  All other systems reviewed and are negative.  Allergies  Review of patient's allergies indicates no known allergies.  Home Medications   Prior to Admission medications   Medication Sig Start Date End Date Taking? Authorizing Provider  albuterol (PROVENTIL HFA;VENTOLIN HFA) 108 (90 BASE) MCG/ACT inhaler Inhale 2 puffs into the lungs every 6 (six) hours as needed for wheezing or shortness of breath.    Historical Provider, MD  HYDROcodone-acetaminophen (NORCO) 10-325 MG per tablet Take 1 tablet by mouth every 6 (six) hours as needed for pain.     Historical Provider, MD  ibuprofen (ADVIL,MOTRIN) 800 MG tablet Take 800 mg by mouth every 8 (eight) hours as needed.    Historical Provider, MD  ondansetron (ZOFRAN ODT) 8 MG disintegrating tablet Take 1 tablet (8 mg total) by mouth every 8 (eight) hours as needed for nausea or vomiting. 02/01/13   Shaune Pollack, MD  oseltamivir (TAMIFLU) 75 MG capsule Take 1 capsule (75 mg total) by mouth every 12 (twelve) hours. 02/01/13   Shaune Pollack, MD   Triage Vitals: BP 139/75  Temp(Src) 98.2 F (36.8 C) (Oral)  Resp 16  Ht 6' (1.829 m)  Wt 209 lb (94.802 kg)  BMI 28.34 kg/m2  SpO2 96%  Physical Exam  Nursing note and vitals reviewed. Constitutional: He is oriented to person, place, and time. He appears well-developed and well-nourished. No distress.  HENT:  Head: Normocephalic and atraumatic.  Eyes: Conjunctivae are normal. Right eye exhibits no discharge. Left eye exhibits no discharge.  Neck: Normal range of motion.  Cardiovascular: Normal rate, regular rhythm and normal heart sounds.   No murmur heard. Pulmonary/Chest: Effort normal and breath sounds normal. No respiratory distress. He has no wheezes. He has no rales.  Abdominal: Soft. He exhibits no distension. There is no tenderness.  There is no rebound and no guarding.  Musculoskeletal: Normal range of motion. He exhibits no edema.  Neurological: He is alert and oriented to person, place, and time.  Skin: Skin is warm and dry.  Psychiatric: He has a normal mood and affect. Thought content normal.    ED Course  Procedures (including critical care time) DIAGNOSTIC STUDIES: Oxygen Satura3tion is 96% on room air, adequate by my interpretation.    COORDINATION OF CARE: At 1020 AM Discussed treatment plan with patient which includes blood work, EKG, nausea/pain medicine, pepcid, CXR, cardiac enzymes. Patient agrees.   Labs Review Labs Reviewed  BASIC METABOLIC PANEL - Abnormal; Notable for the following:    Glucose, Bld 115 (*)    BUN 26 (*)    All other components within normal limits  CBC  TROPONIN I  LIPASE, BLOOD  HEPATIC FUNCTION PANEL  D-DIMER, QUANTITATIVE  TROPONIN I    Imaging Review Dg Chest 2 View  04/26/2013   CLINICAL DATA:  Chest pain.  Left-sided numbness  EXAM: CHEST  2 VIEW  COMPARISON:  10/16/2011  FINDINGS: Airway thickening is present, suggesting bronchitis or reactive airways disease. No airspace opacity is identified in the chest. Cardiac and mediastinal margins appear normal.  IMPRESSION: 1. Stable bilateral airway thickening suggesting bronchitis or reactive airways disease.   Electronically Signed   By: Sherryl Barters M.D.   On: 04/26/2013 11:23     EKG Interpretation   Date/Time:  Wednesday April 26 2013 09:59:43 EDT Ventricular Rate:  67 PR Interval:  150 QRS Duration: 104 QT Interval:  404 QTC Calculation: 426 R Axis:   31 Text Interpretation:  Normal sinus rhythm with sinus arrhythmia Incomplete  right bundle branch block Borderline ECG When compared with ECG of  18-Mar-2011 10:41, No significant change was found Sinus rhythm Sinus  arrhythmia Non-specific intra-ventricular conduction delay No significant  change since last tracing Abnormal ekg Confirmed by Charles Muskrat  MD  [phone]) on 04/26/2013 10:35:29 AM     2:59 PM On repeat exam the patient states that he feels better. Vital signs are stable. Second troponin is unremarkable. Patient voices an understanding of return precautions, follow up instructions.  MDM   This well-appearing young male presents with ongoing chest pain. On exam patient is awake, alert, and in no distress, moving all extremities spontaneously. The patient is afebrile, hemodynamically stable, has multiple negative troponins, nonischemic EKG, and reassuring physical exam. Patient does not smoke. No evidence of ongoing infection. With patient's history of COPD, ongoing pain, he was started on a course of steroids, anti-inflammatories, and will follow up with his primary care physician tomorrow at which point he will be reevaluated, and they can discuss the tick bite as well.   I personally performed the services described in this documentation, which was scribed in my presence. The recorded information has been reviewed and is accurate.  Charles Muskrat, MD 04/26/13 1501

## 2013-04-26 NOTE — ED Notes (Signed)
Pt reports worsening pain and worsening numbness to left arm.  edp notified.

## 2013-07-18 ENCOUNTER — Other Ambulatory Visit (HOSPITAL_COMMUNITY): Payer: Self-pay | Admitting: Internal Medicine

## 2013-07-18 DIAGNOSIS — M5442 Lumbago with sciatica, left side: Secondary | ICD-10-CM

## 2013-07-20 ENCOUNTER — Ambulatory Visit (HOSPITAL_COMMUNITY)
Admission: RE | Admit: 2013-07-20 | Discharge: 2013-07-20 | Disposition: A | Discharge status: Home / Self Care | Payer: Managed Care, Other (non HMO) | Source: Ambulatory Visit | Attending: Internal Medicine | Admitting: Internal Medicine

## 2013-07-20 DIAGNOSIS — M545 Low back pain, unspecified: Secondary | ICD-10-CM | POA: Insufficient documentation

## 2013-07-20 DIAGNOSIS — M79609 Pain in unspecified limb: Secondary | ICD-10-CM | POA: Insufficient documentation

## 2013-07-20 DIAGNOSIS — M159 Polyosteoarthritis, unspecified: Secondary | ICD-10-CM | POA: Insufficient documentation

## 2013-07-20 DIAGNOSIS — M51379 Other intervertebral disc degeneration, lumbosacral region without mention of lumbar back pain or lower extremity pain: Secondary | ICD-10-CM | POA: Insufficient documentation

## 2013-07-20 DIAGNOSIS — M5442 Lumbago with sciatica, left side: Secondary | ICD-10-CM

## 2013-07-20 DIAGNOSIS — M5137 Other intervertebral disc degeneration, lumbosacral region: Secondary | ICD-10-CM | POA: Insufficient documentation

## 2013-08-22 ENCOUNTER — Ambulatory Visit (HOSPITAL_COMMUNITY)
Admission: RE | Admit: 2013-08-22 | Payer: Managed Care, Other (non HMO) | Source: Ambulatory Visit | Admitting: Physical Therapy

## 2014-03-16 ENCOUNTER — Emergency Department (HOSPITAL_COMMUNITY): Payer: BLUE CROSS/BLUE SHIELD

## 2014-03-16 ENCOUNTER — Encounter (HOSPITAL_COMMUNITY): Payer: Self-pay | Admitting: Emergency Medicine

## 2014-03-16 ENCOUNTER — Emergency Department (HOSPITAL_COMMUNITY)
Admission: EM | Admit: 2014-03-16 | Discharge: 2014-03-16 | Disposition: A | Discharge status: Home / Self Care | Payer: BLUE CROSS/BLUE SHIELD | Attending: Emergency Medicine | Admitting: Emergency Medicine

## 2014-03-16 DIAGNOSIS — Z79899 Other long term (current) drug therapy: Secondary | ICD-10-CM | POA: Insufficient documentation

## 2014-03-16 DIAGNOSIS — Z87891 Personal history of nicotine dependence: Secondary | ICD-10-CM | POA: Diagnosis not present

## 2014-03-16 DIAGNOSIS — G8929 Other chronic pain: Secondary | ICD-10-CM | POA: Diagnosis not present

## 2014-03-16 DIAGNOSIS — J449 Chronic obstructive pulmonary disease, unspecified: Secondary | ICD-10-CM | POA: Insufficient documentation

## 2014-03-16 DIAGNOSIS — R0789 Other chest pain: Secondary | ICD-10-CM

## 2014-03-16 DIAGNOSIS — R079 Chest pain, unspecified: Secondary | ICD-10-CM | POA: Diagnosis present

## 2014-03-16 HISTORY — DX: Headache, unspecified: R51.9

## 2014-03-16 HISTORY — DX: Dorsalgia, unspecified: M54.9

## 2014-03-16 HISTORY — DX: Headache: R51

## 2014-03-16 HISTORY — DX: Other chronic pain: G89.29

## 2014-03-16 HISTORY — DX: Cervicalgia: M54.2

## 2014-03-16 HISTORY — DX: Pain in unspecified shoulder: M25.519

## 2014-03-16 LAB — TROPONIN I: Troponin I: <0.03 ng/mL (ref <0.031)

## 2014-03-16 LAB — CBC
HCT: 42.8 % (ref 39.0–52.0)
Hemoglobin: 14.9 g/dL (ref 13.0–17.0)
MCH: 33 pg (ref 26.0–34.0)
MCHC: 34.8 g/dL (ref 30.0–36.0)
MCV: 94.9 fL (ref 78.0–100.0)
Platelets: 156 10*3/uL (ref 150–400)
RBC: 4.51 MIL/uL (ref 4.22–5.81)
RDW: 12.6 % (ref 11.5–15.5)
WBC: 6 10*3/uL (ref 4.0–10.5)

## 2014-03-16 LAB — BASIC METABOLIC PANEL
Anion gap: 7 (ref 5–15)
BUN: 16 mg/dL (ref 6–23)
CHLORIDE: 109 mmol/L (ref 96–112)
CO2: 23 mmol/L (ref 19–32)
CREATININE: 0.85 mg/dL (ref 0.50–1.35)
Calcium: 9 mg/dL (ref 8.4–10.5)
GFR calc non Af Amer: >90 mL/min (ref >90)
GLUCOSE: 110 mg/dL — AB (ref 70–99)
Potassium: 3.9 mmol/L (ref 3.5–5.1)
Sodium: 139 mmol/L (ref 135–145)

## 2014-03-16 MED ORDER — HYDROCODONE-ACETAMINOPHEN 5-325 MG PO TABS
1.0000 | ORAL_TABLET | Freq: Once | ORAL | Status: AC
  Administered 2014-03-16: 1 via ORAL
  Filled 2014-03-16: qty 1

## 2014-03-16 MED ORDER — IBUPROFEN 400 MG PO TABS
400.0000 mg | ORAL_TABLET | Freq: Once | ORAL | Status: AC
  Administered 2014-03-16: 400 mg via ORAL
  Filled 2014-03-16: qty 1

## 2014-03-16 MED ORDER — NAPROXEN 250 MG PO TABS: 250.0000 mg | ORAL_TABLET | Freq: Two times a day (BID) | ORAL | Status: DC | PRN

## 2014-03-16 MED ORDER — METHOCARBAMOL 500 MG PO TABS: 1000.0000 mg | ORAL_TABLET | Freq: Four times a day (QID) | ORAL | Status: DC | PRN

## 2014-03-16 MED ORDER — HYDROCODONE-ACETAMINOPHEN 5-325 MG PO TABS: ORAL_TABLET | ORAL | Status: DC

## 2014-03-16 NOTE — ED Notes (Signed)
Pt has left rib pain since 8 pm last night, shortness of breath and nausea.

## 2014-03-16 NOTE — Discharge Instructions (Signed)
°Emergency Department Resource Guide °1) Find a Doctor and Pay Out of Pocket °Although you won't have to find out who is covered by your insurance plan, it is a good idea to ask around and get recommendations. You will then need to call the office and see if the doctor you have chosen will accept you as a new patient and what types of options they offer for patients who are self-pay. Some doctors offer discounts or will set up payment plans for their patients who do not have insurance, but you will need to ask so you aren't surprised when you get to your appointment. ° °2) Contact Your Local Health Department °Not all health departments have doctors that can see patients for sick visits, but many do, so it is worth a call to see if yours does. If you don't know where your local health department is, you can check in your phone book. The CDC also has a tool to help you locate your state's health department, and many state websites also have listings of all of their local health departments. ° °3) Find a Walk-in Clinic °If your illness is not likely to be very severe or complicated, you may want to try a walk in clinic. These are popping up all over the country in pharmacies, drugstores, and shopping centers. They're usually staffed by nurse practitioners or physician assistants that have been trained to treat common illnesses and complaints. They're usually fairly quick and inexpensive. However, if you have serious medical issues or chronic medical problems, these are probably not your best option. ° °No Primary Care Doctor: °- Call [facility] at  832-8000 - they can help you locate a primary care doctor that  accepts your insurance, provides certain services, etc. °- Physician Referral Service- 1-800-533-3463 ° °Chronic Pain Problems: °Organization         Address  Phone   Notes  °[facility]  (336) 297-2271 Patients need to be referred by their primary care doctor.  ° °Medication  Assistance: °Organization         Address  Phone   Notes  °[place] [facility] [address] °[place], [place] [address] (336) 641-8030 --Must be a resident of [place] °-- Must have NO insurance coverage whatsoever (no Medicaid/ Medicare, etc.) °-- The pt. MUST have a primary care doctor that directs their care regularly and follows them in the community °  °[facility]  (866) 331-1348   °[employer]  (888) 892-1162   ° °Agencies that provide inexpensive medical care: °Organization         Address  Phone   Notes  °[facility]  (336) 832-8035   °[facility]    (336) 832-7272   °[facility] [address] °[place], [place] [address] (336) 832-4777   °[facility] [address], °[place] (336) 271-4999   °[facility]    (336) 373-0678   °[facility]    (336) 272-1050   °[facility] ° [address], [place] Phone:  (336) 832-4444, Fax:  (336) 832-4440 Hours of Operation:  9 am - 6 pm, M-F.  Also accepts Medicaid/Medicare and self-pay.  °[facility] ° [address], [place] Phone: (336) 832-3150, Fax: (336) [phone]. Hours of Operation:  8:30 am - 5:30 pm, M-F.  Also accepts Medicaid and self-pay.  °[facility] [place] [address], [place] Phone: (336) 878-6027   °[facility] [address], [place], [place] (336)723-1848, Ext. 123 Mondays & Thursdays: 7-9 AM.  First 15 patients are seen on a first come, first serve basis. °  ° °Medicaid-accepting [place] Providers: ° °Organization         Address  Phone   Notes  °[facility] [address], [place] (336) 641-2100 Also accepts self-pay patients.  °[facility] [address], [place] ° (336) 856-9996   °[facility] [address], [place]  (336) 288-8857   °[facility] [address] [place] [address], [place] (336) 299-7000   °Ve[facility] Bland [address], [place]  ° (336) 373-1557 Only accepts [place] Access Medicaid patients after they have their name applied to their card.  ° °Self-Pay (no insurance) in [place]: ° °Organization         Address  Phone   Notes  °Sickle Cell Patients, [facility] [address], [place] (336) 832-1970   °[facility] [address], [place] (336) 832-4400   °[facility] [place] ° [address], [place] (336) 992-4800   °[facility]/Dr. Osei-Bonsu ° [address] [place] [address], [place] or [address], [place] (336) 841-8500 Phone number for both [place] and [place] locations is the same.  °[facility] [address], [place] (336) 299-0000   °[facility] [address] [place] [address], [place] or [address] (336) 852-7530 °(336) 878-2260   °[facility] [address], [place] (336) 350-1642, phone; (336) 294-5005, fax Sees patients 1st and 3rd Saturday of every month.  Must not qualify for public or private insurance (i.e. Medicaid, Medicare, [place] Health Choice, Veterans' Benefits) • Household income should be no more than 200% of the poverty level •The clinic cannot treat you if you are pregnant or think you are pregnant • Sexually transmitted diseases are not treated at the clinic.  ° ° °Dental Care: °Organization         Address  Phone  Notes  °[place] [facility] [address], [place] (336) 641-6152 Accepts children up to age 21 who are enrolled in Medicaid or [place] Health Choice; pregnant women with a Medicaid card; and children who have applied for Medicaid or [place] Health Choice, but were declined, whose parents can pay a reduced fee at time of service.  °[place]  [facility] [place]  [address], [place] (336) 641-7733 Accepts children up to age 21 who are enrolled in Medicaid or [place] Health Choice; pregnant women with a Medicaid card; and children who have applied for Medicaid or [place] Health Choice, but were declined, whose parents can pay a reduced fee at time of service.  °[facility] ° [address],  (336) 641-4533 Patients are seen by appointment only. Walk-ins are not accepted. [facility] will see patients 18 years of age and older. °Monday - Tuesday (8am-5pm) °Most Wednesdays (8:30-5pm) °$30 per visit, cash only  °[facility] ° [address], [place] (336) 641-4533 Patients are seen by appointment only. Walk-ins are not accepted. [facility] will see patients 18 years of age and older. °One   Wednesday Evening (Monthly: Volunteer Based).  $30 per visit, cash only  °[facility]  (919) 537-3737 for adults; Children under age 4, call Graduate Pediatric Dentistry at (919) [phone]. Children aged 4-14, please call (919) [phone] to request a pediatric application. ° Dental services are provided in all areas of dental care including fillings, crowns and bridges, complete and partial dentures, implants, gum treatment, root canals, and extractions. Preventive care is also provided. Treatment is provided to both adults and children. °Patients are selected via a lottery and there is often a waiting list. °  °[facility] [address], °[place] ° (336) 763-8833 [email or web address] °  °[facility] [address], [place], [place] (336)723-1848, Ext. 123 Second and Fourth Thursday of each month, opens at 6:30 AM; Clinic ends at 9 AM.  Patients are seen on a first-come first-served basis, and a limited number are seen during each clinic.  ° °[facility] ° [address], [place], [place] (336) 723-7904    Eligibility Requirements °You must have lived in [place], [place], or [place] counties for at least the last three months. °  You cannot be eligible for state or federal sponsored healthcare insurance, including Veterans Administration, Medicaid, or Medicare. °  You generally cannot be eligible for healthcare insurance through your employer.  °  How to apply: °Eligibility screenings are held every Tuesday and Wednesday afternoon from 1:00 pm until 4:00 pm. You do not need an appointment for the interview!  °[facility] [address], [place], [place] 336-631-2330   °[facility]  336-342-8273   °[facility]  336-703-3100   °[facility]  336-570-6415   ° °Behavioral Health Resources in the Community: °Intensive Outpatient Programs °Organization         Address  Phone  Notes  °[facility] [address], [place], [place] 336-878-6098   °[facility] [address], [place], [place] 336-832-9800   °[facility] [address], [place], [place] ° 336-882-2125   °[facility] [address],  °[place], [place] 1-800-853-5163 or 336-641-4981   °Substance Abuse Resources °Organization         Address  Phone  Notes  °[facility]  336-882-2125   °[facility]  336-784-9470   °[facility]  336-285-9073   °[facility]  336-845-3988   °Residential & Outpatient Substance Abuse Program  1-800-659-3381   °Psychological Services °Organization         Address  Phone  Notes  ° Health  336- 832-9600   °[facility]  336- 378-7881   °[facility] [address], [place] 1-800-853-5163 or 336-641-4981   ° °Mobile Crisis Teams °Organization         Address  Phone  Notes  °[facility], Mobile Crisis Care Unit  1-877-626-1772   °Assertive °[facility] ° [address]  [place], [place] 336-834-9664   °Sharon DeEsch [address] °[place] 336-554-5454   ° °Self-Help/Support Groups °Organization         Address  Phone             Notes  °[facility] [place] [facility] - variety of support groups  336- 373-1402 Call for more information  °Narcotics Anonymous (NA), [facility] [address], °[place]  2 meetings at this location  ° °  Residential Treatment Programs Organization         Address  Phone  Notes  [facility] [address],    [place]  1-(270)781-1500   [facility]  [address], [place] [address], [place], [place]   [place], [place] 858-756-5894 Admissions: 8am-3pm M-F  [facility] [place] [address]    [place], [place] 940-768-0881   [facility] [address] [place], [place], [place]   [facility] [address] [place] [address],  [place], [place]   [facility] - Intensive Outpatient [address], [place], [place]   [facility] [address]  [place], [place] 1-774 286 0802 or [phone]   [facility] [address] [place] [address] [place], [place] Accepts Medicaid  [facility] [address],  [place] 1-860-820-2003 Substance Abuse/Addiction Treatment   [facility]         Address  Phone  Notes  [facility]  (773) 243-5210   Domenic Schwab, PhD [address] [place], [place]   (602) 221-1327 or (938) 677-4996   [facility] [place], [place] 281-870-0610   [facility] [address], [place], [place] 225-520-1753 Insurance/Medicaid/sponsorship through [facility] and Families [address]                                    [place], [place] 845-861-0074 [facility] [address], [place] [phone]    Dr. Adele Schilder  815-047-1245   [facility] Dept. 1) [address] [place] 2) [place] 3)  [address], [place] [phone] (734) [phone]  581-780-2836   [place] 469-580-4813 or [phone] (After Hours)      Take the prescriptions as directed.  Apply moist heat or ice to the area(s) of discomfort, for 15 minutes at a time, several times per day for the next few days.  Do not fall asleep on a heating or ice pack.  Call your regular medical doctor today to schedule a follow up appointment next week.  Return to the Emergency Department immediately if worsening.

## 2014-03-16 NOTE — ED Provider Notes (Signed)
CSN: [id number]     Arrival date & time 03/16/14  2694 History   First MD Initiated Contact with Patient 03/16/14 [phone]     Chief Complaint  Patient presents with  . Chest Pain      HPI Pt was seen at 0720. Per pt, c/o gradual onset and persistence of constant left sided chest wall "pain" that began last night at 2000. Pt states he was yawning when the pain began. Describes the pain as "sharp." Denies rash, no fevers, no cough, no palpitations, no abd pain, no N/V/D, no injury.    Past Medical History  Diagnosis Date  . COPD (chronic obstructive pulmonary disease)   . Chronic back pain   . Chronic neck pain   . Chronic shoulder pain   . Headache    Past Surgical History  Procedure Laterality Date  . Shoulder surgery    . Neck surgery     Family History  Problem Relation Age of Onset  . Heart failure Mother   . Diabetes Mother   . Hypertension Mother   . Cancer Father   . Heart failure Father    History  Substance Use Topics  . Smoking status: Former Smoker -- 1.00 packs/day    Types: Cigarettes    Quit date: 08/06/2011  . Smokeless tobacco: Not on file  . Alcohol Use: 3.6 oz/week    6 Cans of beer per week     Comment: twice a week    Review of Systems ROS: Statement: All systems negative except as marked or noted in the HPI; Constitutional: Negative for fever and chills. ; ; Eyes: Negative for eye pain, redness and discharge. ; ; ENMT: Negative for ear pain, hoarseness, nasal congestion, sinus pressure and sore throat. ; ; Cardiovascular: Negative for chest pain, palpitations, diaphoresis, dyspnea and peripheral edema. ; ; Respiratory: Negative for cough, wheezing and stridor. ; ; Gastrointestinal: Negative for nausea, vomiting, diarrhea, abdominal pain, blood in stool, hematemesis, jaundice and rectal bleeding. . ; ; Genitourinary: Negative for dysuria, flank pain and hematuria. ; ; Musculoskeletal: +chest wall pain. Negative for back pain and neck pain. Negative for  swelling and trauma.; ; Skin: Negative for pruritus, rash, abrasions, blisters, bruising and skin lesion.; ; Neuro: Negative for headache, lightheadedness and neck stiffness. Negative for weakness, altered level of consciousness , altered mental status, extremity weakness, paresthesias, involuntary movement, seizure and syncope.      Allergies  Review of patient's allergies indicates no known allergies.  Home Medications   Prior to Admission medications   Medication Sig Start Date End Date Taking? Authorizing Provider  traMADol (ULTRAM) 50 MG tablet Take by mouth every 6 (six) hours as needed for moderate pain.   Yes Historical Provider, MD  albuterol (PROVENTIL HFA;VENTOLIN HFA) 108 (90 BASE) MCG/ACT inhaler Inhale 2 puffs into the lungs every 6 (six) hours as needed for wheezing or shortness of breath.    Historical Provider, MD  HYDROcodone-acetaminophen (NORCO) 10-325 MG per tablet Take 1 tablet by mouth every 6 (six) hours as needed. 04/26/13   [name], MD  ibuprofen (ADVIL,MOTRIN) 800 MG tablet Take 1 tablet (800 mg total) by mouth 3 (three) times daily. 04/26/13   [name], MD  Tetrahydrozoline HCl (VISINE OP) Place 1 drop into both eyes daily.    Historical Provider, MD   BP 139/85 mmHg  Pulse 69  Temp(Src) 98.7 F (37.1 C) (Oral)  Resp 10  Ht 6' (1.829 m)  Wt 212 lb (96.163 kg)  BMI 28.75 kg/m2  SpO2 97% Physical Exam  0725; Physical examination:  Nursing notes reviewed; Vital signs and O2 SAT reviewed;  Constitutional: Well developed, Well nourished, Well hydrated, In no acute distress; Head:  Normocephalic, atraumatic; Eyes: EOMI, PERRL, No scleral icterus; ENMT: Mouth and pharynx normal, Mucous membranes moist; Neck: Supple, Full range of motion, No lymphadenopathy; Cardiovascular: Regular rate and rhythm, No murmur, rub, or gallop; Respiratory: Breath sounds clear & equal bilaterally, No rales, rhonchi, wheezes.  Speaking full sentences with ease, Normal  respiratory effort/excursion; Chest: +left lower anterior-lateral chest wall tender to palp. No deformity, no soft tissue crepitus. Movement normal; Abdomen: Soft, Nontender, Nondistended, Normal bowel sounds; Genitourinary: No CVA tenderness; Extremities: Pulses normal, No tenderness, No edema, No calf edema or asymmetry.; Neuro: AA&Ox3, Major CN grossly intact.  Speech clear. No gross focal motor or sensory deficits in extremities.; Skin: Color normal, Warm, Dry.   ED Course  Procedures     EKG Interpretation   Date/Time:  Friday March 16 2014 06:44:53 EST Ventricular Rate:  74 PR Interval:  151 QRS Duration: 107 QT Interval:  381 QTC Calculation: 423 R Axis:   44 Text Interpretation:  Sinus rhythm RSR' in V1 or V2, right VCD or RVH When  compared with ECG of 04/26/2013 No significant change was found Confirmed  by [facility]  MD, Nunzio Cory [id number]) on 03/16/2014 7:41:17 AM      MDM  MDM Reviewed: previous chart, nursing note and vitals Reviewed previous: ECG and labs Interpretation: ECG, labs and x-ray      Results for orders placed or performed during the hospital encounter of 03/16/14  CBC  Result Value Ref Range   WBC 6.0 4.0 - 10.5 K/uL   RBC 4.51 4.22 - 5.81 MIL/uL   Hemoglobin 14.9 13.0 - 17.0 g/dL   HCT 42.8 39.0 - 52.0 %   MCV 94.9 78.0 - 100.0 fL   MCH 33.0 26.0 - 34.0 pg   MCHC 34.8 30.0 - 36.0 g/dL   RDW 12.6 11.5 - 15.5 %   Platelets 156 150 - 400 K/uL  Basic metabolic panel  Result Value Ref Range   Sodium 139 135 - 145 mmol/L   Potassium 3.9 3.5 - 5.1 mmol/L   Chloride 109 96 - 112 mmol/L   CO2 23 19 - 32 mmol/L   Glucose, Bld 110 (H) 70 - 99 mg/dL   BUN 16 6 - 23 mg/dL   Creatinine, Ser 0.85 0.50 - 1.35 mg/dL   Calcium 9.0 8.4 - 10.5 mg/dL   GFR calc non Af Amer >90 >90 mL/min   GFR calc Af Amer >90 >90 mL/min   Anion gap 7 5 - 15  Troponin I (MHP)  Result Value Ref Range   Troponin I <0.03 <0.031 ng/mL   Dg Chest Port 1 View 03/16/2014    CLINICAL DATA:  Left-sided chest pain with shortness of Breath  EXAM: PORTABLE CHEST - 1 VIEW  COMPARISON:  None.  FINDINGS: The heart size and mediastinal contours are within normal limits. Both lungs are clear. The visualized skeletal structures are unremarkable.  IMPRESSION: No active disease.   Electronically Signed   By: Inez Catalina M.D.   On: 03/16/2014 07:05    0830:  Doubt PE as cause for symptoms with low risk [place].  Doubt ACS as cause for symptoms with normal troponin and unchanged EKG from previous after 12 hours of constant symptoms. Feels better after meds and wants to go home now. Tx symptomatically  at this time. Dx and testing d/w pt.  Questions answered.  Verb understanding, agreeable to d/c home with outpt f/u.    Francine Graven, DO 03/18/14 1849

## 2014-04-27 ENCOUNTER — Emergency Department (HOSPITAL_COMMUNITY)
Admission: EM | Admit: 2014-04-27 | Discharge: 2014-04-27 | Disposition: A | Discharge status: Home / Self Care | Payer: BLUE CROSS/BLUE SHIELD | Attending: Emergency Medicine | Admitting: Emergency Medicine

## 2014-04-27 ENCOUNTER — Emergency Department (HOSPITAL_COMMUNITY): Payer: BLUE CROSS/BLUE SHIELD

## 2014-04-27 ENCOUNTER — Encounter (HOSPITAL_COMMUNITY): Payer: Self-pay | Admitting: Emergency Medicine

## 2014-04-27 DIAGNOSIS — R221 Localized swelling, mass and lump, neck: Secondary | ICD-10-CM | POA: Diagnosis not present

## 2014-04-27 DIAGNOSIS — Z79899 Other long term (current) drug therapy: Secondary | ICD-10-CM | POA: Diagnosis not present

## 2014-04-27 DIAGNOSIS — G8929 Other chronic pain: Secondary | ICD-10-CM | POA: Insufficient documentation

## 2014-04-27 DIAGNOSIS — R739 Hyperglycemia, unspecified: Secondary | ICD-10-CM | POA: Diagnosis not present

## 2014-04-27 DIAGNOSIS — J449 Chronic obstructive pulmonary disease, unspecified: Secondary | ICD-10-CM | POA: Insufficient documentation

## 2014-04-27 DIAGNOSIS — Z87891 Personal history of nicotine dependence: Secondary | ICD-10-CM | POA: Diagnosis not present

## 2014-04-27 LAB — CBC WITH DIFFERENTIAL/PLATELET
Basophils Absolute: 0 10*3/uL (ref 0.0–0.1)
Basophils Relative: 0 % (ref 0–1)
EOS PCT: 1 % (ref 0–5)
Eosinophils Absolute: 0.1 10*3/uL (ref 0.0–0.7)
HCT: 42.5 % (ref 39.0–52.0)
HEMOGLOBIN: 14.6 g/dL (ref 13.0–17.0)
Lymphocytes Relative: 25 % (ref 12–46)
Lymphs Abs: 1.7 10*3/uL (ref 0.7–4.0)
MCH: 32.6 pg (ref 26.0–34.0)
MCHC: 34.4 g/dL (ref 30.0–36.0)
MCV: 94.9 fL (ref 78.0–100.0)
MONO ABS: 0.4 10*3/uL (ref 0.1–1.0)
MONOS PCT: 6 % (ref 3–12)
Neutro Abs: 4.6 10*3/uL (ref 1.7–7.7)
Neutrophils Relative %: 68 % (ref 43–77)
Platelets: 157 10*3/uL (ref 150–400)
RBC: 4.48 MIL/uL (ref 4.22–5.81)
RDW: 12.5 % (ref 11.5–15.5)
WBC: 6.8 10*3/uL (ref 4.0–10.5)

## 2014-04-27 LAB — COMPREHENSIVE METABOLIC PANEL
ALBUMIN: 4.3 g/dL (ref 3.5–5.2)
ALT: 17 U/L (ref 0–53)
AST: 20 U/L (ref 0–37)
Alkaline Phosphatase: 39 U/L (ref 39–117)
Anion gap: 8 (ref 5–15)
BUN: 18 mg/dL (ref 6–23)
CALCIUM: 9.1 mg/dL (ref 8.4–10.5)
CO2: 24 mmol/L (ref 19–32)
CREATININE: 0.94 mg/dL (ref 0.50–1.35)
Chloride: 108 mmol/L (ref 96–112)
GFR calc Af Amer: >90 mL/min (ref >90)
GFR calc non Af Amer: >90 mL/min (ref >90)
Glucose, Bld: 139 mg/dL — ABNORMAL HIGH (ref 70–99)
Potassium: 3.8 mmol/L (ref 3.5–5.1)
Sodium: 140 mmol/L (ref 135–145)
TOTAL PROTEIN: 6.9 g/dL (ref 6.0–8.3)
Total Bilirubin: 0.6 mg/dL (ref 0.3–1.2)

## 2014-04-27 LAB — MONONUCLEOSIS SCREEN: Mono Screen: NEGATIVE

## 2014-04-27 LAB — TSH: TSH: 3.306 u[IU]/mL (ref 0.350–4.500)

## 2014-04-27 MED ORDER — IOHEXOL 300 MG/ML  SOLN
80.0000 mL | Freq: Once | INTRAMUSCULAR | Status: AC | PRN
  Administered 2014-04-27: 80 mL via INTRAVENOUS

## 2014-04-27 NOTE — ED Notes (Signed)
Pt reports a knot to the L throat that he first noted 3 weeks ago. Pt denies difficulty swallowing. Pt reports nausea x 1 week. No emesis.

## 2014-04-27 NOTE — ED Provider Notes (Signed)
CSN: [id number]     Arrival date & time 04/27/14  1306 History   First MD Initiated Contact with Patient 04/27/14 1320     Chief Complaint  Patient presents with  . Knot on throat      (Consider location/radiation/quality/duration/timing/severity/associated sxs/prior Treatment) HPI Comments: Patient reports "painful knot" to the left side of his throat that he noticed about 3 weeks ago. He states he is not getting any better or worse staying the same and causing pain and he feels a lump there. Denies difficulty swallowing or breathing. He's had nausea for the past week. Denies any vomiting, chest pain, shortness of breath, abdominal pain. Denies any fevers or recent travel. He is concerned because one of his friends was diagnosed with throat cancer. He called his PCP and was told to come to the ED. Denies any thyroid conditions. He states he has lost about 15 pounds in 6 months without necessarily trying. Endorses one or 2 episodes of night sweats in the past several months. Denies any lumps in his groin or axilla. Denies any dental pain.  The history is provided by the patient.    Past Medical History  Diagnosis Date  . COPD (chronic obstructive pulmonary disease)   . Chronic back pain   . Chronic neck pain   . Chronic shoulder pain   . Headache    Past Surgical History  Procedure Laterality Date  . Shoulder surgery    . Neck surgery     Family History  Problem Relation Age of Onset  . Heart failure Mother   . Diabetes Mother   . Hypertension Mother   . Cancer Father   . Heart failure Father    History  Substance Use Topics  . Smoking status: Former Smoker -- 1.00 packs/day    Types: Cigarettes    Quit date: 08/06/2011  . Smokeless tobacco: Not on file  . Alcohol Use: 3.6 oz/week    6 Cans of beer per week     Comment: twice a week    Review of Systems  Constitutional: Positive for fatigue. Negative for fever, activity change and appetite change.  HENT: Negative for  dental problem and trouble swallowing.   Eyes: Negative for visual disturbance.  Respiratory: Negative for cough, chest tightness and shortness of breath.   Cardiovascular: Negative for chest pain.  Gastrointestinal: Positive for nausea. Negative for vomiting and abdominal pain.  Genitourinary: Negative for dysuria and hematuria.  Musculoskeletal: Negative for myalgias and arthralgias.  Skin: Negative for wound.  Neurological: Positive for weakness. Negative for dizziness, light-headedness and headaches.  A complete 10 system review of systems was obtained and all systems are negative except as noted in the HPI and PMH.      Allergies  Review of patient's allergies indicates no known allergies.  Home Medications   Prior to Admission medications   Medication Sig Start Date End Date Taking? Authorizing Provider  albuterol (PROVENTIL HFA;VENTOLIN HFA) 108 (90 BASE) MCG/ACT inhaler Inhale 2 puffs into the lungs every 6 (six) hours as needed for wheezing or shortness of breath.   Yes Historical Provider, MD  ibuprofen (ADVIL,MOTRIN) 200 MG tablet Take 400 mg by mouth every 6 (six) hours as needed for mild pain.   Yes Historical Provider, MD  nortriptyline (PAMELOR) 25 MG capsule Take 1 capsule by mouth at bedtime. 04/18/14  Yes Historical Provider, MD  oxyCODONE-acetaminophen (PERCOCET/ROXICET) 5-325 MG per tablet Take 1 tablet by mouth every 4 (four) hours as needed for moderate  pain or severe pain.   Yes Historical Provider, MD  Tetrahydrozoline HCl (VISINE OP) Place 1 drop into both eyes daily.   Yes Historical Provider, MD  HYDROcodone-acetaminophen (NORCO/VICODIN) 5-325 MG per tablet 1 or 2 tabs PO q6 hours prn pain Patient not taking: Reported on 04/27/2014 03/16/14   Francine Graven, DO  ibuprofen (ADVIL,MOTRIN) 800 MG tablet Take 1 tablet (800 mg total) by mouth 3 (three) times daily. Patient not taking: Reported on 04/27/2014 04/26/13   [name], MD  methocarbamol (ROBAXIN) 500  MG tablet Take 2 tablets (1,000 mg total) by mouth 4 (four) times daily as needed for muscle spasms (muscle spasm/pain). Patient not taking: Reported on 04/27/2014 03/16/14   Francine Graven, DO  naproxen (NAPROSYN) 250 MG tablet Take 1 tablet (250 mg total) by mouth 2 (two) times daily as needed for mild pain or moderate pain (take with food). Patient not taking: Reported on 04/27/2014 03/16/14   Francine Graven, DO   BP 128/55 mmHg  Pulse 63  Temp(Src) 98.9 F (37.2 C) (Oral)  Resp 20  Ht 6' (1.829 m)  Wt 205 lb (92.987 kg)  BMI 27.80 kg/m2  SpO2 98% Physical Exam  Constitutional: He is oriented to person, place, and time. He appears well-developed and well-nourished. No distress.  No distress, speaking in full sentences, controlling secretions.  HENT:  Head: Normocephalic and atraumatic.  Mouth/Throat: Oropharynx is clear and moist. No oropharyngeal exudate.  Oropharynx is clear. Uvula midline. No asymmetry. Floor of mouth is soft. No appreciable dental abscess or dental tenderness. There is no thyromegaly or thyroid tenderness. There is tenderness to the left submandibular region without appreciable asymmetry or swelling.  Eyes: Conjunctivae and EOM are normal. Pupils are equal, round, and reactive to light.  Neck: Normal range of motion. Neck supple.  No meningismus.  Cardiovascular: Normal rate, regular rhythm, normal heart sounds and intact distal pulses.   No murmur heard. Pulmonary/Chest: Effort normal and breath sounds normal. No respiratory distress.  Abdominal: Soft. There is no tenderness. There is no rebound and no guarding.  Musculoskeletal: Normal range of motion. He exhibits no edema or tenderness.  Neurological: He is alert and oriented to person, place, and time. No cranial nerve deficit. He exhibits normal muscle tone. Coordination normal.  No ataxia on finger to nose bilaterally. No pronator drift. 5/5 strength throughout. CN 2-12 intact. Negative Romberg. Equal grip  strength. Sensation intact. Gait is normal.   Skin: Skin is warm.  Psychiatric: He has a normal mood and affect. His behavior is normal.  Nursing note and vitals reviewed.   ED Course  Procedures (including critical care time) Labs Review Labs Reviewed  COMPREHENSIVE METABOLIC PANEL - Abnormal; Notable for the following:    Glucose, Bld 139 (*)    All other components within normal limits  CBC WITH DIFFERENTIAL/PLATELET  TSH  MONONUCLEOSIS SCREEN    Imaging Review Ct Soft Tissue Neck W Contrast  04/27/2014   CLINICAL DATA:  Sore throat.  Left neck pain and swelling.  EXAM: CT NECK WITH CONTRAST  TECHNIQUE: Multidetector CT imaging of the neck was performed using the standard protocol following the bolus administration of intravenous contrast.  CONTRAST:  59mL OMNIPAQUE IOHEXOL 300 MG/ML  SOLN  COMPARISON:  None.  FINDINGS: Pharynx and larynx: Negative  Salivary glands: Negative  Thyroid: 10 mm nodule right upper pole of the thyroid. 5 mm nodule right midpole thyroid. Left lobe of the thyroid normal in size and density.  Lymph nodes: No pathologic  adenopathy. Right level 2 node 7 mm. Left level 2 node 7 mm. Left lower level 5 node 7 mm.  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 No acute bony change.  Upper chest: Negative  IMPRESSION: Negative for mass or adenopathy.  Right thyroid nodule. 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   Electronically Signed   By: Franchot Gallo M.D.   On: 04/27/2014 15:31     EKG Interpretation None      MDM   Final diagnoses:  Neck swelling  Hyperglycemia  L throat pain and swelling.  No difficulty breathing or swallowing.  No leukocytosis. Afebrile. Screening blood work is remarkable for mild hyperglycemia.  CT results d/w patient.  TSH normal. Needs  thyroid US scheduled by PCP.  Also needs followup of blood sugar.   Return precautions discussed. Patient to follow-up for ultrasound by his PCP as well as further evaluation of his hyperglycemia. No evidence of DKA.   Ezequiel Essex, MD 04/27/14 2147

## 2014-04-27 NOTE — Discharge Instructions (Signed)
Thyroid Cyst Follow-up with Dr. Karie Kirks for an ultrasound of your thyroid gland. You also need to have your blood sugar rechecked. Return to the ED if you develop new or worsening symptoms.  The thyroid gland is a butterfly-shaped gland in the middle of the neck, located just below the in stable right basilar voice box. It makes thyroid hormone. Thyroid hormone has an effect on nearly all tissues of your body by regulating your metabolism. Metabolism is the breakdown and use of food that you eat or energy that is stored in your body. Your metabolism affects your heart rate, blood pressure, body temperature, and weight. Thyroid cysts are enlarged fluid filled regions of the thyroid gland. These cysts range in size and may expand and enlarge suddenly. Rapidly expanding cysts may cause pain, difficulty swallowing, and rarely, difficulty breathing. Most cysts of the thyroid are not cancerous (benign). SYMPTOMS Bleeding may occur within the cyst. If the bleeding is severe, the cyst may get larger and produce problems in the neck, including swelling that may produce pain and difficultly swallowing. If the vocal cords are compressed, hoarseness may occur. If the windpipe is compressed, you may have difficulty breathing. DIAGNOSIS  A thyroid cyst is diagnosed through physical exam. The diagnosis can be confirmed by an ultrasound exam of the neck. This creates a picture by bouncing sound waves off the thyroid gland. Sometimes the cysts are drained using a fine needle. The fluid is then sent to the lab where it can be examined. This is done to see if any cells in the fluid are cancerous. If they are found to be cancerous, you will need further treatment.  TREATMENT  If the fluid in your neck does not show evidence of cancer, your caregiver may just want to monitor you with yearly ultrasound exams. Sometimes cysts need to be removed surgically. Document Released: 11/15/2003 Document Revised: 03/16/2011 Document  Reviewed: 02/27/2010 [facility] Patient Information 2015 [place], [place]. This information is not intended to replace advice given to you by your health care provider. Make sure you discuss any questions you have with your health care provider.

## 2014-05-01 ENCOUNTER — Other Ambulatory Visit (HOSPITAL_COMMUNITY): Payer: Self-pay | Admitting: Family Medicine

## 2014-05-01 DIAGNOSIS — R221 Localized swelling, mass and lump, neck: Secondary | ICD-10-CM

## 2014-05-04 ENCOUNTER — Ambulatory Visit (HOSPITAL_COMMUNITY)
Admission: RE | Admit: 2014-05-04 | Discharge: 2014-05-04 | Disposition: A | Discharge status: Home / Self Care | Payer: BLUE CROSS/BLUE SHIELD | Source: Ambulatory Visit | Attending: Family Medicine | Admitting: Family Medicine

## 2014-05-04 DIAGNOSIS — E041 Nontoxic single thyroid nodule: Secondary | ICD-10-CM | POA: Insufficient documentation

## 2014-05-04 DIAGNOSIS — R599 Enlarged lymph nodes, unspecified: Secondary | ICD-10-CM | POA: Insufficient documentation

## 2014-05-04 DIAGNOSIS — R221 Localized swelling, mass and lump, neck: Secondary | ICD-10-CM | POA: Diagnosis present

## 2014-07-21 ENCOUNTER — Emergency Department (HOSPITAL_COMMUNITY)
Admission: EM | Admit: 2014-07-21 | Discharge: 2014-07-21 | Disposition: A | Discharge status: Home / Self Care | Payer: BLUE CROSS/BLUE SHIELD | Attending: Emergency Medicine | Admitting: Emergency Medicine

## 2014-07-21 ENCOUNTER — Encounter (HOSPITAL_COMMUNITY): Payer: Self-pay | Admitting: *Deleted

## 2014-07-21 ENCOUNTER — Emergency Department (HOSPITAL_COMMUNITY): Payer: BLUE CROSS/BLUE SHIELD

## 2014-07-21 DIAGNOSIS — R51 Headache: Secondary | ICD-10-CM | POA: Diagnosis not present

## 2014-07-21 DIAGNOSIS — J449 Chronic obstructive pulmonary disease, unspecified: Secondary | ICD-10-CM | POA: Diagnosis not present

## 2014-07-21 DIAGNOSIS — H53149 Visual discomfort, unspecified: Secondary | ICD-10-CM | POA: Diagnosis not present

## 2014-07-21 DIAGNOSIS — Z87891 Personal history of nicotine dependence: Secondary | ICD-10-CM | POA: Diagnosis not present

## 2014-07-21 DIAGNOSIS — G8929 Other chronic pain: Secondary | ICD-10-CM | POA: Insufficient documentation

## 2014-07-21 DIAGNOSIS — H538 Other visual disturbances: Secondary | ICD-10-CM | POA: Diagnosis not present

## 2014-07-21 DIAGNOSIS — Z79899 Other long term (current) drug therapy: Secondary | ICD-10-CM | POA: Insufficient documentation

## 2014-07-21 DIAGNOSIS — R519 Headache, unspecified: Secondary | ICD-10-CM

## 2014-07-21 MED ORDER — SODIUM CHLORIDE 0.9 % IV BOLUS (SEPSIS)
1000.0000 mL | Freq: Once | INTRAVENOUS | Status: AC
  Administered 2014-07-21: 1000 mL via INTRAVENOUS

## 2014-07-21 MED ORDER — DIPHENHYDRAMINE HCL 50 MG/ML IJ SOLN
25.0000 mg | Freq: Once | INTRAMUSCULAR | Status: AC
  Administered 2014-07-21: 25 mg via INTRAVENOUS
  Filled 2014-07-21: qty 1

## 2014-07-21 MED ORDER — BUTALBITAL-APAP-CAFFEINE 50-325-40 MG PO TABS: 1.0000 | ORAL_TABLET | Freq: Three times a day (TID) | ORAL | Status: AC | PRN

## 2014-07-21 MED ORDER — KETOROLAC TROMETHAMINE 30 MG/ML IJ SOLN
15.0000 mg | Freq: Once | INTRAMUSCULAR | Status: AC
  Administered 2014-07-21: 15 mg via INTRAVENOUS
  Filled 2014-07-21: qty 1

## 2014-07-21 MED ORDER — METOCLOPRAMIDE HCL 5 MG/ML IJ SOLN
10.0000 mg | Freq: Once | INTRAMUSCULAR | Status: AC
  Administered 2014-07-21: 10 mg via INTRAVENOUS
  Filled 2014-07-21: qty 2

## 2014-07-21 NOTE — ED Notes (Addendum)
Pt reporting headaches for aprox 2 weeks.  Reporting some blurriness of vision. Denies nausea or vomiting. Reports occasional feeling of numbness in face.

## 2014-07-21 NOTE — Discharge Instructions (Signed)
General Headache Without Cause A general headache is pain or discomfort felt around the head or neck area. The cause may not be found.  HOME CARE   Keep all doctor visits.  Only take medicines as told by your doctor.  Lie down in a dark, quiet room when you have a headache.  Keep a journal to find out if certain things bring on headaches. For example, write down:  What you eat and drink.  How much sleep you get.  Any change to your diet or medicines.  Relax by getting a massage or doing other relaxing activities.  Put ice or heat packs on the head and neck area as told by your doctor.  Lessen stress.  Sit up straight. Do not tighten (tense) your muscles.  Quit smoking if you smoke.  Lessen how much alcohol you drink.  Lessen how much caffeine you drink, or stop drinking caffeine.  Eat and sleep on a regular schedule.  Get 7 to 9 hours of sleep, or as told by your doctor.  Keep lights dim if bright lights bother you or make your headaches worse. GET HELP RIGHT AWAY IF:   Your headache becomes really bad.  You have a fever.  You have a stiff neck.  You have trouble seeing.  Your muscles are weak, or you lose muscle control.  You lose your balance or have trouble walking.  You feel like you will pass out (faint), or you pass out.  You have really bad symptoms that are different than your first symptoms.  You have problems with the medicines given to you by your doctor.  Your medicines do not work.  Your headache feels different than the other headaches.  You feel sick to your stomach (nauseous) or throw up (vomit). MAKE SURE YOU:   Understand these instructions.  Will watch your condition.  Will get help right away if you are not doing well or get worse. Document Released: 10/01/2007 Document Revised: 03/16/2011 Document Reviewed: 12/12/2010 [facility] Patient Information 2015 [place], [place]. This information is not intended to replace advice given to  you by your health care provider. Make sure you discuss any questions you have with your health care provider.  Headaches, Frequently Asked Questions MIGRAINE HEADACHES Q: What is migraine? What causes it? How can I treat it? A: Generally, migraine headaches begin as a dull ache. Then they develop into a constant, throbbing, and pulsating pain. You may experience pain at the temples. You may experience pain at the front or back of one or both sides of the head. The pain is usually accompanied by a combination of:  Nausea.  Vomiting.  Sensitivity to light and noise. Some people (about 15%) experience an aura (see below) before an attack. The cause of migraine is believed to be chemical reactions in the brain. Treatment for migraine may include over-the-counter or prescription medications. It may also include self-help techniques. These include relaxation training and biofeedback.  Q: What is an aura? A: About 15% of people with migraine get an "aura". This is a sign of neurological symptoms that occur before a migraine headache. You may see wavy or jagged lines, dots, or flashing lights. You might experience tunnel vision or blind spots in one or both eyes. The aura can include visual or auditory hallucinations (something imagined). It may include disruptions in smell (such as strange odors), taste or touch. Other symptoms include:  Numbness.  A "pins and needles" sensation.  Difficulty in recalling or speaking the  correct word. These neurological events may last as long as 60 minutes. These symptoms will fade as the headache begins. Q: What is a trigger? A: Certain physical or environmental factors can lead to or "trigger" a migraine. These include:  Foods.  Hormonal changes.  Weather.  Stress. It is important to remember that triggers are different for everyone. To help prevent migraine attacks, you need to figure out which triggers affect you. Keep a headache diary. This is a good  way to track triggers. The diary will help you talk to your healthcare professional about your condition. Q: Does weather affect migraines? A: Bright sunshine, hot, humid conditions, and drastic changes in barometric pressure may lead to, or "trigger," a migraine attack in some people. But studies have shown that weather does not act as a trigger for everyone with migraines. Q: What is the link between migraine and hormones? A: Hormones start and regulate many of your body's functions. Hormones keep your body in balance within a constantly changing environment. The levels of hormones in your body are unbalanced at times. Examples are during menstruation, pregnancy, or menopause. That can lead to a migraine attack. In fact, about three quarters of all women with migraine report that their attacks are related to the menstrual cycle.  Q: Is there an increased risk of stroke for migraine sufferers? A: The likelihood of a migraine attack causing a stroke is very remote. That is not to say that migraine sufferers cannot have a stroke associated with their migraines. In persons under age 60, the most common associated factor for stroke is migraine headache. But over the course of a person's normal life span, the occurrence of migraine headache may actually be associated with a reduced risk of dying from cerebrovascular disease due to stroke.  Q: What are acute medications for migraine? A: Acute medications are used to treat the pain of the headache after it has started. Examples over-the-counter medications, NSAIDs, ergots, and triptans.  Q: What are the triptans? A: Triptans are the newest class of abortive medications. They are specifically targeted to treat migraine. Triptans are vasoconstrictors. They moderate some chemical reactions in the brain. The triptans work on receptors in your brain. Triptans help to restore the balance of a neurotransmitter called serotonin. Fluctuations in levels of serotonin are  thought to be a main cause of migraine.  Q: Are over-the-counter medications for migraine effective? A: Over-the-counter, or "OTC," medications may be effective in relieving mild to moderate pain and associated symptoms of migraine. But you should see your caregiver before beginning any treatment regimen for migraine.  Q: What are preventive medications for migraine? A: Preventive medications for migraine are sometimes referred to as "prophylactic" treatments. They are used to reduce the frequency, severity, and length of migraine attacks. Examples of preventive medications include antiepileptic medications, antidepressants, beta-blockers, calcium channel blockers, and NSAIDs (nonsteroidal anti-inflammatory drugs). Q: Why are anticonvulsants used to treat migraine? A: During the past few years, there has been an increased interest in antiepileptic drugs for the prevention of migraine. They are sometimes referred to as "anticonvulsants". Both epilepsy and migraine may be caused by similar reactions in the brain.  Q: Why are antidepressants used to treat migraine? A: Antidepressants are typically used to treat people with depression. They may reduce migraine frequency by regulating chemical levels, such as serotonin, in the brain.  Q: What alternative therapies are used to treat migraine? A: The term "alternative therapies" is often used to describe treatments considered outside the  scope of conventional Western medicine. Examples of alternative therapy include acupuncture, acupressure, and yoga. Another common alternative treatment is herbal therapy. Some herbs are believed to relieve headache pain. Always discuss alternative therapies with your caregiver before proceeding. Some herbal products contain arsenic and other toxins. TENSION HEADACHES Q: What is a tension-type headache? What causes it? How can I treat it? A: Tension-type headaches occur randomly. They are often the result of temporary stress,  anxiety, fatigue, or anger. Symptoms include soreness in your temples, a tightening band-like sensation around your head (a "vice-like" ache). Symptoms can also include a pulling feeling, pressure sensations, and contracting head and neck muscles. The headache begins in your forehead, temples, or the back of your head and neck. Treatment for tension-type headache may include over-the-counter or prescription medications. Treatment may also include self-help techniques such as relaxation training and biofeedback. CLUSTER HEADACHES Q: What is a cluster headache? What causes it? How can I treat it? A: Cluster headache gets its name because the attacks come in groups. The pain arrives with little, if any, warning. It is usually on one side of the head. A tearing or bloodshot eye and a runny nose on the same side of the headache may also accompany the pain. Cluster headaches are believed to be caused by chemical reactions in the brain. They have been described as the most severe and intense of any headache type. Treatment for cluster headache includes prescription medication and oxygen. SINUS HEADACHES Q: What is a sinus headache? What causes it? How can I treat it? A: When a cavity in the bones of the face and skull (a sinus) becomes inflamed, the inflammation will cause localized pain. This condition is usually the result of an allergic reaction, a tumor, or an infection. If your headache is caused by a sinus blockage, such as an infection, you will probably have a fever. An x-ray will confirm a sinus blockage. Your caregiver's treatment might include antibiotics for the infection, as well as antihistamines or decongestants.  REBOUND HEADACHES Q: What is a rebound headache? What causes it? How can I treat it? A: A pattern of taking acute headache medications too often can lead to a condition known as "rebound headache." A pattern of taking too much headache medication includes taking it more than 2 days per  week or in excessive amounts. That means more than the label or a caregiver advises. With rebound headaches, your medications not only stop relieving pain, they actually begin to cause headaches. Doctors treat rebound headache by tapering the medication that is being overused. Sometimes your caregiver will gradually substitute a different type of treatment or medication. Stopping may be a challenge. Regularly overusing a medication increases the potential for serious side effects. Consult a caregiver if you regularly use headache medications more than 2 days per week or more than the label advises. ADDITIONAL QUESTIONS AND ANSWERS Q: What is biofeedback? A: Biofeedback is a self-help treatment. Biofeedback uses special equipment to monitor your body's involuntary physical responses. Biofeedback monitors:  Breathing.  Pulse.  Heart rate.  Temperature.  Muscle tension.  Brain activity. Biofeedback helps you refine and perfect your relaxation exercises. You learn to control the physical responses that are related to stress. Once the technique has been mastered, you do not need the equipment any more. Q: Are headaches hereditary? A: Four out of five (80%) of people that suffer report a family history of migraine. Scientists are not sure if this is genetic or a family predisposition. Despite  the uncertainty, a child has a 50% chance of having migraine if one parent suffers. The child has a 75% chance if both parents suffer.  Q: Can children get headaches? A: By the time they reach high school, most young people have experienced some type of headache. Many safe and effective approaches or medications can prevent a headache from occurring or stop it after it has begun.  Q: What type of doctor should I see to diagnose and treat my headache? A: Start with your primary caregiver. Discuss his or her experience and approach to headaches. Discuss methods of classification, diagnosis, and treatment. Your  caregiver may decide to recommend you to a headache specialist, depending upon your symptoms or other physical conditions. Having diabetes, allergies, etc., may require a more comprehensive and inclusive approach to your headache. The National Head[employer] Foundation will provide, upon request, a list of [facility] physician members in your state. Document Released: 03/14/2003 Document Revised: 03/16/2011 Document Reviewed: 08/22/2007 [facility] Patient Information 2015 [place], [place]. This information is not intended to replace advice given to you by your health care provider. Make sure you discuss any questions you have with your health care provider.

## 2014-07-29 NOTE — ED Provider Notes (Signed)
CSN: [id number]     Arrival date & time 07/21/14  1918 History   First MD Initiated Contact with Patient 07/21/14 1927     Chief Complaint  Patient presents with  . Migraine     (Consider location/radiation/quality/duration/timing/severity/associated sxs/prior Treatment) HPI   44 year old male with headaches. Intermittent last 2 weeks. Pain is diffuse and achy. No appreciable exacerbating relieving factors. Sometimes some mild blurred vision with the headaches. Also photophobia. No nausea or vomiting. No acute numbness, tingling or loss of strength. No fevers or chills. He has a history of chronic neck pain. Hhe denies any acute change in this. Has tried taking over-the-counter pain medicines with only mild relief.  Past Medical History  Diagnosis Date  . COPD (chronic obstructive pulmonary disease)   . Chronic back pain   . Chronic neck pain   . Chronic shoulder pain   . Headache    Past Surgical History  Procedure Laterality Date  . Shoulder surgery    . Neck surgery     Family History  Problem Relation Age of Onset  . Heart failure Mother   . Diabetes Mother   . Hypertension Mother   . Cancer Father   . Heart failure Father    History  Substance Use Topics  . Smoking status: Former Smoker -- 1.00 packs/day    Types: Cigarettes    Quit date: 08/06/2011  . Smokeless tobacco: Not on file  . Alcohol Use: 3.6 oz/week    6 Cans of beer per week     Comment: twice a week    Review of Systems  All systems reviewed and negative, other than as noted in HPI.   Allergies  Review of patient's allergies indicates no known allergies.  Home Medications   Prior to Admission medications   Medication Sig Start Date End Date Taking? Authorizing Provider  FLUoxetine (PROZAC) 20 MG tablet Take 20 mg by mouth daily. 05/08/14  Yes Historical Provider, MD  levothyroxine (SYNTHROID, LEVOTHROID) 50 MCG tablet Take 50 mcg by mouth daily. 07/19/14  Yes Historical Provider, MD   nortriptyline (PAMELOR) 25 MG capsule Take 1 capsule by mouth at bedtime. 04/18/14  Yes Historical Provider, MD  NUCYNTA 50 MG TABS tablet Take 50 mg by mouth daily. 06/26/14  Yes Historical Provider, MD  butalbital-acetaminophen-caffeine (FIORICET) 50-325-40 MG per tablet Take 1-2 tablets by mouth 3 (three) times daily as needed for headache. 07/21/14 07/21/15  Virgel Manifold, MD  HYDROcodone-acetaminophen (NORCO/VICODIN) 5-325 MG per tablet 1 or 2 tabs PO q6 hours prn pain Patient not taking: Reported on 04/27/2014 03/16/14   Francine Graven, DO  ibuprofen (ADVIL,MOTRIN) 800 MG tablet Take 1 tablet (800 mg total) by mouth 3 (three) times daily. Patient not taking: Reported on 04/27/2014 04/26/13   [name], MD  methocarbamol (ROBAXIN) 500 MG tablet Take 2 tablets (1,000 mg total) by mouth 4 (four) times daily as needed for muscle spasms (muscle spasm/pain). Patient not taking: Reported on 04/27/2014 03/16/14   Francine Graven, DO  naproxen (NAPROSYN) 250 MG tablet Take 1 tablet (250 mg total) by mouth 2 (two) times daily as needed for mild pain or moderate pain (take with food). Patient not taking: Reported on 04/27/2014 03/16/14   Francine Graven, DO  traZODone (DESYREL) 50 MG tablet Take 50 mg by mouth at bedtime. 07/18/14   Historical Provider, MD   BP 165/85 mmHg  Pulse 97  Temp(Src) 98.4 F (36.9 C) (Oral)  Resp 22  Ht 6' (1.829 m)  [facility]  207 lb (93.895 kg)  BMI 28.07 kg/m2  SpO2 100% Physical Exam  Constitutional: He is oriented to person, place, and time. He appears well-developed and well-nourished. No distress.  HENT:  Head: Normocephalic and atraumatic.  Eyes: Conjunctivae and EOM are normal. Pupils are equal, round, and reactive to light. Right eye exhibits no discharge. Left eye exhibits no discharge.  Neck: Neck supple.  No nuchal rigidity  Cardiovascular: Normal rate, regular rhythm and normal heart sounds.  Exam reveals no gallop and no friction rub.   No murmur  heard. Pulmonary/Chest: Effort normal and breath sounds normal. No respiratory distress.  Abdominal: Soft. He exhibits no distension. There is no tenderness.  Musculoskeletal: He exhibits no edema or tenderness.  Neurological: He is alert and oriented to person, place, and time. No cranial nerve deficit. He exhibits normal muscle tone. Coordination normal.  Speech clear. Content appropriate. Follows commands. Good finger to nose testing bilaterally.  Skin: Skin is warm and dry.  Psychiatric: He has a normal mood and affect. His behavior is normal. Thought content normal.  Nursing note and vitals reviewed.   ED Course  Procedures (including critical care time) Labs Review Labs Reviewed - No data to display  Imaging Review No results found.   EKG Interpretation None      MDM   Final diagnoses:  Nonintractable headache, unspecified chronicity pattern, unspecified headache type    44 year old male with headache. Suspect primary HA. Consider emergent secondary causes such as bleed, infectious or mass but doubt. There is no history of trauma. Pt has a nonfocal neurological exam. Afebrile and neck supple. No use of blood thinning medication. Consider ocular etiology such as acute angle closure glaucoma but doubt. Pt denies acute change in visual acuity and eye exam unremarkable. Doubt temporal arteritis given age, no temporal tenderness and temporal artery pulsations palpable. Doubt CO poisoning. No contacts with similar symptoms. Doubt venous thrombosis. Doubt carotid or vertebral arteries dissection. Symptoms improved with meds. Feel that can be safely discharged, but strict return precautions discussed. Outpt fu.     Virgel Manifold, MD 07/29/14 [phone]

## 2014-11-06 ENCOUNTER — Other Ambulatory Visit (HOSPITAL_COMMUNITY): Payer: Self-pay | Admitting: Family Medicine

## 2014-11-06 DIAGNOSIS — M545 Low back pain: Secondary | ICD-10-CM

## 2014-11-08 ENCOUNTER — Ambulatory Visit (HOSPITAL_COMMUNITY)
Admission: RE | Admit: 2014-11-08 | Discharge: 2014-11-08 | Disposition: A | Discharge status: Home / Self Care | Payer: BLUE CROSS/BLUE SHIELD | Source: Ambulatory Visit | Attending: Family Medicine | Admitting: Family Medicine

## 2014-11-08 DIAGNOSIS — M545 Low back pain: Secondary | ICD-10-CM | POA: Diagnosis not present

## 2014-11-08 DIAGNOSIS — R937 Abnormal findings on diagnostic imaging of other parts of musculoskeletal system: Secondary | ICD-10-CM | POA: Insufficient documentation

## 2014-11-08 DIAGNOSIS — R109 Unspecified abdominal pain: Secondary | ICD-10-CM | POA: Insufficient documentation

## 2014-11-08 MED ORDER — IOHEXOL 300 MG/ML  SOLN
100.0000 mL | Freq: Once | INTRAMUSCULAR | Status: AC | PRN
  Administered 2014-11-08: 100 mL via INTRAVENOUS

## 2014-11-21 ENCOUNTER — Other Ambulatory Visit (HOSPITAL_COMMUNITY): Payer: Self-pay | Admitting: Family Medicine

## 2014-11-21 DIAGNOSIS — M87051 Idiopathic aseptic necrosis of right femur: Secondary | ICD-10-CM

## 2014-11-21 DIAGNOSIS — M87052 Idiopathic aseptic necrosis of left femur: Secondary | ICD-10-CM

## 2014-11-27 ENCOUNTER — Other Ambulatory Visit (HOSPITAL_COMMUNITY): Payer: Self-pay | Admitting: Internal Medicine

## 2014-11-27 ENCOUNTER — Ambulatory Visit (HOSPITAL_COMMUNITY)
Admission: RE | Admit: 2014-11-27 | Discharge: 2014-11-27 | Disposition: A | Discharge status: Home / Self Care | Payer: BLUE CROSS/BLUE SHIELD | Source: Ambulatory Visit | Attending: Family Medicine | Admitting: Family Medicine

## 2014-11-27 DIAGNOSIS — M87052 Idiopathic aseptic necrosis of left femur: Secondary | ICD-10-CM

## 2014-11-27 DIAGNOSIS — M87051 Idiopathic aseptic necrosis of right femur: Secondary | ICD-10-CM

## 2014-11-28 ENCOUNTER — Ambulatory Visit (HOSPITAL_COMMUNITY)
Admission: RE | Admit: 2014-11-28 | Discharge: 2014-11-28 | Disposition: A | Discharge status: Home / Self Care | Payer: BLUE CROSS/BLUE SHIELD | Source: Ambulatory Visit | Attending: Family Medicine | Admitting: Family Medicine

## 2014-11-28 DIAGNOSIS — M87852 Other osteonecrosis, left femur: Secondary | ICD-10-CM | POA: Diagnosis not present

## 2014-11-28 DIAGNOSIS — M25552 Pain in left hip: Secondary | ICD-10-CM | POA: Diagnosis not present

## 2014-11-28 DIAGNOSIS — M25551 Pain in right hip: Secondary | ICD-10-CM | POA: Diagnosis not present

## 2014-11-28 DIAGNOSIS — M545 Low back pain: Secondary | ICD-10-CM | POA: Insufficient documentation

## 2014-11-28 DIAGNOSIS — M87851 Other osteonecrosis, right femur: Secondary | ICD-10-CM | POA: Insufficient documentation

## 2014-11-28 DIAGNOSIS — M461 Sacroiliitis, not elsewhere classified: Secondary | ICD-10-CM | POA: Insufficient documentation

## 2014-11-28 MED ORDER — GADOBENATE DIMEGLUMINE 529 MG/ML IV SOLN
20.0000 mL | Freq: Once | INTRAVENOUS | Status: AC | PRN
  Administered 2014-11-28: 20 mL via INTRAVENOUS

## 2015-02-18 ENCOUNTER — Emergency Department (HOSPITAL_COMMUNITY): Payer: BLUE CROSS/BLUE SHIELD

## 2015-02-18 ENCOUNTER — Emergency Department (HOSPITAL_COMMUNITY)
Admission: EM | Admit: 2015-02-18 | Discharge: 2015-02-18 | Disposition: A | Discharge status: Home / Self Care | Payer: BLUE CROSS/BLUE SHIELD | Attending: Emergency Medicine | Admitting: Emergency Medicine

## 2015-02-18 ENCOUNTER — Encounter (HOSPITAL_COMMUNITY): Payer: Self-pay | Admitting: *Deleted

## 2015-02-18 DIAGNOSIS — G8929 Other chronic pain: Secondary | ICD-10-CM | POA: Diagnosis not present

## 2015-02-18 DIAGNOSIS — Z79899 Other long term (current) drug therapy: Secondary | ICD-10-CM | POA: Insufficient documentation

## 2015-02-18 DIAGNOSIS — R0789 Other chest pain: Secondary | ICD-10-CM | POA: Diagnosis not present

## 2015-02-18 DIAGNOSIS — R079 Chest pain, unspecified: Secondary | ICD-10-CM | POA: Diagnosis present

## 2015-02-18 DIAGNOSIS — J449 Chronic obstructive pulmonary disease, unspecified: Secondary | ICD-10-CM | POA: Insufficient documentation

## 2015-02-18 DIAGNOSIS — Z87891 Personal history of nicotine dependence: Secondary | ICD-10-CM | POA: Diagnosis not present

## 2015-02-18 LAB — BASIC METABOLIC PANEL
Anion gap: 9 (ref 5–15)
BUN: 15 mg/dL (ref 6–20)
CALCIUM: 9.5 mg/dL (ref 8.9–10.3)
CHLORIDE: 107 mmol/L (ref 101–111)
CO2: 24 mmol/L (ref 22–32)
CREATININE: 0.94 mg/dL (ref 0.61–1.24)
Glucose, Bld: 96 mg/dL (ref 65–99)
Potassium: 4.1 mmol/L (ref 3.5–5.1)
SODIUM: 140 mmol/L (ref 135–145)

## 2015-02-18 LAB — CBC
HCT: 43.1 % (ref 39.0–52.0)
Hemoglobin: 15 g/dL (ref 13.0–17.0)
MCH: 32.8 pg (ref 26.0–34.0)
MCHC: 34.8 g/dL (ref 30.0–36.0)
MCV: 94.1 fL (ref 78.0–100.0)
PLATELETS: 166 10*3/uL (ref 150–400)
RBC: 4.58 MIL/uL (ref 4.22–5.81)
RDW: 12.4 % (ref 11.5–15.5)
WBC: 7.4 10*3/uL (ref 4.0–10.5)

## 2015-02-18 LAB — TROPONIN I

## 2015-02-18 MED ORDER — ACETAMINOPHEN 500 MG PO TABS
1000.0000 mg | ORAL_TABLET | Freq: Once | ORAL | Status: AC
  Administered 2015-02-18: 1000 mg via ORAL
  Filled 2015-02-18: qty 2

## 2015-02-18 MED ORDER — IBUPROFEN 400 MG PO TABS
400.0000 mg | ORAL_TABLET | Freq: Once | ORAL | Status: AC
  Administered 2015-02-18: 400 mg via ORAL
  Filled 2015-02-18: qty 1

## 2015-02-18 MED ORDER — NAPROXEN 250 MG PO TABS: 250.0000 mg | ORAL_TABLET | Freq: Two times a day (BID) | ORAL | Status: DC | PRN

## 2015-02-18 MED ORDER — METHOCARBAMOL 500 MG PO TABS: 1000.0000 mg | ORAL_TABLET | Freq: Four times a day (QID) | ORAL | Status: DC | PRN

## 2015-02-18 NOTE — ED Provider Notes (Signed)
CSN: [id number]     Arrival date & time 02/18/15  1432 History   First MD Initiated Contact with Patient 02/18/15 1526     Chief Complaint  Patient presents with  . Chest Pain      HPI Pt was seen at 1540. Per pt, c/o gradual onset and persistence of constant left sided chest wall "pain" that began at 2200 last night. Describes the pain as constant, "sharp" and "stabbing," worse with palpation of the area and body position changes. Denies injury, no palpitations, no SOB/cough, no abd pain, no N/V/D, no fevers, no rash.     Past Medical History  Diagnosis Date  . COPD (chronic obstructive pulmonary disease) ([place])   . Chronic back pain   . Chronic neck pain   . Chronic shoulder pain   . Headache    Past Surgical History  Procedure Laterality Date  . Shoulder surgery    . Neck surgery     Family History  Problem Relation Age of Onset  . Heart failure Mother   . Diabetes Mother   . Hypertension Mother   . Cancer Father   . Heart failure Father    Social History  Substance Use Topics  . Smoking status: Former Smoker -- 0.50 packs/day    Types: Cigarettes  . Smokeless tobacco: None  . Alcohol Use: 3.6 oz/week    6 Cans of beer per week     Comment: twice a week    Review of Systems ROS: Statement: All systems negative except as marked or noted in the HPI; Constitutional: Negative for fever and chills. ; ; Eyes: Negative for eye pain, redness and discharge. ; ; ENMT: Negative for ear pain, hoarseness, nasal congestion, sinus pressure and sore throat. ; ; Cardiovascular: Negative for palpitations, diaphoresis, dyspnea and peripheral edema. ; ; Respiratory: Negative for cough, wheezing and stridor. ; ; Gastrointestinal: Negative for nausea, vomiting, diarrhea, abdominal pain, blood in stool, hematemesis, jaundice and rectal bleeding. . ; ; Genitourinary: Negative for dysuria, flank pain and hematuria. ; ; Musculoskeletal: +chest wall pain. Negative for back pain and neck pain.  Negative for swelling and trauma.; ; Skin: Negative for pruritus, rash, abrasions, blisters, bruising and skin lesion.; ; Neuro: Negative for headache, lightheadedness and neck stiffness. Negative for weakness, altered level of consciousness , altered mental status, extremity weakness, paresthesias, involuntary movement, seizure and syncope.      Allergies  Review of patient's allergies indicates no known allergies.  Home Medications   Prior to Admission medications   Medication Sig Start Date End Date Taking? Authorizing Provider  butalbital-acetaminophen-caffeine (FIORICET) [phone] MG per tablet Take 1-2 tablets by mouth 3 (three) times daily as needed for headache. 07/21/14 07/21/15  Virgel Manifold, MD  FLUoxetine (PROZAC) 20 MG tablet Take 20 mg by mouth daily. 05/08/14   Historical Provider, MD  HYDROcodone-acetaminophen (NORCO/VICODIN) 5-325 MG per tablet 1 or 2 tabs PO q6 hours prn pain Patient not taking: Reported on 04/27/2014 03/16/14   Francine Graven, DO  ibuprofen (ADVIL,MOTRIN) 800 MG tablet Take 1 tablet (800 mg total) by mouth 3 (three) times daily. Patient not taking: Reported on 04/27/2014 04/26/13   [name], MD  levothyroxine (SYNTHROID, LEVOTHROID) 50 MCG tablet Take 50 mcg by mouth daily. 07/19/14   Historical Provider, MD  methocarbamol (ROBAXIN) 500 MG tablet Take 2 tablets (1,000 mg total) by mouth 4 (four) times daily as needed for muscle spasms (muscle spasm/pain). Patient not taking: Reported on 04/27/2014 03/16/14   Nunzio Cory  Lonya Johannesen, DO  naproxen (NAPROSYN) 250 MG tablet Take 1 tablet (250 mg total) by mouth 2 (two) times daily as needed for mild pain or moderate pain (take with food). Patient not taking: Reported on 04/27/2014 03/16/14   Francine Graven, DO  nortriptyline (PAMELOR) 25 MG capsule Take 1 capsule by mouth at bedtime. 04/18/14   Historical Provider, MD  NUCYNTA 50 MG TABS tablet Take 50 mg by mouth daily. 06/26/14   Historical Provider, MD  traZODone  (DESYREL) 50 MG tablet Take 50 mg by mouth at bedtime. 07/18/14   Historical Provider, MD   BP 145/93 mmHg  Pulse 59  Temp(Src) 97.6 F (36.4 C) (Oral)  Resp 13  Ht 6' (1.829 m)  Wt 210 lb (95.255 kg)  BMI 28.47 kg/m2  SpO2 97% Physical Exam  1545: Physical examination:  Nursing notes reviewed; Vital signs and O2 SAT reviewed;  Constitutional: Well developed, Well nourished, Well hydrated, In no acute distress; Head:  Normocephalic, atraumatic; Eyes: EOMI, PERRL, No scleral icterus; ENMT: Mouth and pharynx normal, Mucous membranes moist; Neck: Supple, Full range of motion, No lymphadenopathy; Cardiovascular: Regular rate and rhythm, No murmur, rub, or gallop; Respiratory: Breath sounds clear & equal bilaterally, No rales, rhonchi, wheezes.  Speaking full sentences with ease, Normal respiratory effort/excursion; Chest: +left anterior lower ribs tender to palp. No soft tissue crepitus, no rash, no deformity. Movement normal; Abdomen: Soft, Nontender, Nondistended, Normal bowel sounds; Genitourinary: No CVA tenderness; Extremities: Pulses normal, No tenderness, No edema, No calf edema or asymmetry.; Neuro: AA&Ox3, Major CN grossly intact.  Speech clear. No gross focal motor or sensory deficits in extremities.; Skin: Color normal, Warm, Dry.   ED Course  Procedures (including critical care time) Labs Review  Imaging Review  I have personally reviewed and evaluated these images and lab results as part of my medical decision-making.   EKG Interpretation   Date/Time:  Monday February 18 2015 14:39:02 EST Ventricular Rate:  73 PR Interval:  142 QRS Duration: 98 QT Interval:  364 QTC Calculation: 401 R Axis:   51 Text Interpretation:  Normal sinus rhythm Incomplete right bundle branch  block Artifact When compared with ECG of 03/16/2014 No significant change  was found Confirmed by [facility]  MD, Nunzio Cory [phone]) on 02/18/2015 3:33:46  PM      MDM  MDM Reviewed: previous chart, nursing  note and vitals Reviewed previous: ECG Interpretation: ECG, x-ray and labs      Results for orders placed or performed during the hospital encounter of 0000000  Basic metabolic panel  Result Value Ref Range   Sodium 140 135 - 145 mmol/L   Potassium 4.1 3.5 - 5.1 mmol/L   Chloride 107 101 - 111 mmol/L   CO2 24 22 - 32 mmol/L   Glucose, Bld 96 65 - 99 mg/dL   BUN 15 6 - 20 mg/dL   Creatinine, Ser 0.94 0.61 - 1.24 mg/dL   Calcium 9.5 8.9 - 10.3 mg/dL   GFR calc non Af Amer >60 >60 mL/min   GFR calc Af Amer >60 >60 mL/min   Anion gap 9 5 - 15  CBC  Result Value Ref Range   WBC 7.4 4.0 - 10.5 K/uL   RBC 4.58 4.22 - 5.81 MIL/uL   Hemoglobin 15.0 13.0 - 17.0 g/dL   HCT 43.1 39.0 - 52.0 %   MCV 94.1 78.0 - 100.0 fL   MCH 32.8 26.0 - 34.0 pg   MCHC 34.8 30.0 - 36.0 g/dL   RDW  12.4 11.5 - 15.5 %   Platelets 166 150 - 400 K/uL  Troponin I  Result Value Ref Range   Troponin I <0.03 <0.031 ng/mL   Dg Chest 2 View 02/18/2015  CLINICAL DATA:  Chest pain EXAM: CHEST  2 VIEW COMPARISON:  03/16/2014 chest radiograph. FINDINGS: Surgical plate with interlocking screws overlies the lower cervical spine. Stable cardiomediastinal silhouette with normal heart size. No pneumothorax. No pleural effusion. Lungs appear clear, with no acute consolidative airspace disease and no pulmonary edema. IMPRESSION: No active cardiopulmonary disease. Electronically Signed   By: Ilona Sorrel M.D.   On: 02/18/2015 14:56    1630:  Doubt PE as cause for symptoms with low risk Wells and PERC negative.  Doubt ACS as cause for symptoms with normal troponin and unchanged EKG from previous after 15+ hours of constant symptoms. Tx symptomatically at this time. Dx and testing d/w pt.  Questions answered.  Verb understanding, agreeable to d/c home with outpt f/u.    Francine Graven, DO 02/22/15 2256

## 2015-02-18 NOTE — ED Notes (Signed)
Pt comes in with left chest pain starting last night. Pt states he has had SOB accompanying this pain. Pt denies any n/v or dizziness.

## 2015-02-18 NOTE — Discharge Instructions (Signed)
°Emergency Department Resource Guide °1) Find a Doctor and Pay Out of Pocket °Although you won't have to find out who is covered by your insurance plan, it is a good idea to ask around and get recommendations. You will then need to call the office and see if the doctor you have chosen will accept you as a new patient and what types of options they offer for patients who are self-pay. Some doctors offer discounts or will set up payment plans for their patients who do not have insurance, but you will need to ask so you aren't surprised when you get to your appointment. ° °2) Contact Your Local Health Department °Not all health departments have doctors that can see patients for sick visits, but many do, so it is worth a call to see if yours does. If you don't know where your local health department is, you can check in your phone book. The CDC also has a tool to help you locate your state's health department, and many state websites also have listings of all of their local health departments. ° °3) Find a Walk-in Clinic °If your illness is not likely to be very severe or complicated, you may want to try a walk in clinic. These are popping up all over the country in pharmacies, drugstores, and shopping centers. They're usually staffed by nurse practitioners or physician assistants that have been trained to treat common illnesses and complaints. They're usually fairly quick and inexpensive. However, if you have serious medical issues or chronic medical problems, these are probably not your best option. ° °No Primary Care Doctor: °- Call Health Connect at  832-8000 - they can help you locate a primary care doctor that  accepts your insurance, provides certain services, etc. °- Physician Referral Service- 1-800-533-3463 ° °Chronic Pain Problems: °Organization         Address  Phone   Notes  °[facility]  (336) [phone] Patients need to be referred by their primary care doctor.  ° °Medication  Assistance: °Organization         Address  Phone   Notes  °[place] [facility] [address] °[place], [place] [address] (336) 641-8030 --Must be a resident of [place] °-- Must have NO insurance coverage whatsoever (no Medicaid/ Medicare, etc.) °-- The pt. MUST have a primary care doctor that directs their care regularly and follows them in the community °  °[facility]  (866) 331-1348   °[employer]  (888) 892-1162   ° °Agencies that provide inexpensive medical care: °Organization         Address  Phone   Notes  °[facility]  (336) 832-8035   °[facility]    (336) 832-7272   °[facility] [address] °[place], [place] [address] (336) 832-4777   °[facility] [address], °[place] (336) 271-4999   °[facility]    (336) 373-0678   °[facility]    (336) 272-1050   °[facility] ° [address], [place] Phone:  (336) 832-4444, Fax:  (336) 832-4440 Hours of Operation:  9 am - 6 pm, M-F.  Also accepts Medicaid/Medicare and self-pay.  °[facility] ° [address], [place] Phone: (336) [phone], Fax: (336) [phone]. Hours of Operation:  8:30 am - 5:30 pm, M-F.  Also accepts Medicaid and self-pay.  °[facility] [place] [address], [place] Phone: (336) 878-6027   °[facility] [address], [place], [place] (336)723-1848, Ext. 123 Mondays & Thursdays: 7-9 AM.  First 15 patients are seen on a first come, first serve basis. °  ° °Medicaid-accepting [place] Providers: ° °Organization         Address  Phone   Notes  °[facility] [address], [place] (336) 641-2100 Also accepts self-pay patients.  °[facility] [address], [place] ° (336) 856-9996   °[facility] [address], [place]  (336) 288-8857   °[facility] [address] [place] [address], [place] (336) 299-7000   °Veita Bland [address], [place]  ° (336) 373-1557 Only accepts [place] Access Medicaid patients after they have their name applied to their card.  ° °Self-Pay (no insurance) in [place]: ° °Organization         Address  Phone   Notes  °Sickle Cell Patients, [facility] [address], [place] (336) 832-1970   °[facility] [address], [place] (336) 832-4400   °[facility] ° [address], [place] (336) 992-4800   °[facility]/Dr. Osei-Bonsu ° [address] [place] [address], [place] or [address], [place] (336) [phone] Phone number for both [place] and [place] locations is the same.  °[facility] [address], [place] (336) 299-0000   °[facility] [address] [place] [address], [place] or [address] (336) 852-7530 °(336) 878-2260   °[facility] [address], [place] (336) 350-1642, phone; (336) 294-5005, fax Sees patients 1st and 3rd Saturday of every month.  Must not qualify for public or private insurance (i.e. Medicaid, Medicare, [place] Health Choice, Veterans' Benefits) • Household income should be no more than 200% of the poverty level •The clinic cannot treat you if you are pregnant or think you are pregnant • Sexually transmitted diseases are not treated at the clinic.  ° ° °Dental Care: °Organization         Address  Phone  Notes  °[place] [facility] [address], [place] (336) 641-6152 Accepts children up to age 21 who are enrolled in Medicaid or [place] Health Choice; pregnant women with a Medicaid card; and children who have applied for Medicaid or [place] Health Choice, but were declined, whose parents can pay a reduced fee at time of service.  °[place]  [facility] [place]  [address], [place] (336) 641-7733 Accepts children up to age 21 who are enrolled in Medicaid or [place] Health Choice; pregnant women with a Medicaid card; and children who have applied for Medicaid or [place] Health Choice, but were declined, whose parents can pay a reduced fee at time of service.  °[facility] ° [address], [place] (336) 641-4533 Patients are seen by appointment only. Walk-ins are not accepted. [facility] will see patients 18 years of age and older. °Monday - Tuesday (8am-5pm) °Most Wednesdays (8:30-5pm) °$30 per visit, cash only  °[facility] ° [address], [place] (336) 641-4533 Patients are seen by appointment only. Walk-ins are not accepted. [facility] will see patients 18 years of age and older. °One   Wednesday Evening (Monthly: Volunteer Based).  $30 per visit, cash only  °[facility]  (919) 537-3737 for adults; Children under age 4, call Graduate Pediatric Dentistry at ([phone]) [phone]. Children aged 4-14, please call ([phone]) [phone] to request a pediatric application. ° Dental services are provided in all areas of dental care including fillings, crowns and bridges, complete and partial dentures, implants, gum treatment, root canals, and extractions. Preventive care is also provided. Treatment is provided to both adults and children. °Patients are selected via a lottery and there is often a waiting list. °  °[facility] [address], °[place] ° (336) 763-8833 [email or web address] °  °[facility] [address], [place], [place] (336)723-1848, Ext. 123 Second and Fourth Thursday of each month, opens at 6:30 AM; Clinic ends at 9 AM.  Patients are seen on a first-come first-served basis, and a limited number are seen during each clinic.  ° °[facility] ° [address], [place], [place] (336) 723-7904    Eligibility Requirements °You must have lived in [place], [place], or [place] counties for at least the last three months. °  You cannot be eligible for state or federal sponsored healthcare insurance, including Veterans Administration, Medicaid, or Medicare. °  You generally cannot be eligible for healthcare insurance through your employer.  °  How to apply: °Eligibility screenings are held every Tuesday and Wednesday afternoon from 1:00 pm until 4:00 pm. You do not need an appointment for the interview!  °[facility] [address], [place], [place] 336-631-2330   °[facility]  336-342-8273   °[facility]  336-703-3100   °[facility]  336-570-6415   ° °Behavioral Health Resources in the Community: °Intensive Outpatient Programs °Organization         Address  Phone  Notes  °[facility] [address], [place], [place] 336-878-6098   °[facility] [address], [place], [place] 336-832-9800   °[facility] [address], [place], [place] ° 336-882-2125   °[facility] [address],  °[place], [place] 1-800-853-5163 or 336-641-4981   °Substance Abuse Resources °Organization         Address  Phone  Notes  °[facility]  336-882-2125   °[facility]  336-784-9470   °[facility]  336-285-9073   °[facility]  336-845-3988   °Residential & Outpatient Substance Abuse Program  1-800-659-3381   °Psychological Services °Organization         Address  Phone  Notes  °[facility]  336- 832-9600   °[facility]  336- 378-7881   °[facility] [address], [place] 1-800-853-5163 or 336-641-4981   ° °Mobile Crisis Teams °Organization         Address  Phone  Notes  °[facility], Mobile Crisis Care Unit  1-877-626-1772   °Assertive °[facility] ° [address]  [place], [place] 336-834-9664   °Sharon DeEsch [address] °[place] 336-554-5454   ° °Self-Help/Support Groups °Organization         Address  Phone             Notes  °[facility]  - variety of support groups  336- 373-1402 Call for more information  °Narcotics Anonymous (NA), [facility] [address], °[place]   2 meetings at this location  ° °  Residential Treatment Programs Organization         Address  Phone  Notes  [facility] [address]    [place]  1-(423) 503-5739   [facility]  [address], [place] [id number], [place], [place]   [place], [place] [phone] Admissions: 8am-3pm M-F  [facility] [place] [address]    [place], [place] [id number]   [facility] [address] [place], [place], [place]   [facility] [address]  [place], [place]   [facility] Intensive Outpatient [address], [place], [place]   [facility] [address].,  [place], [place] 1-(920)665-9625 or 717-457-5217   [facility] [address], [place], [place] Accepts Medicaid  [facility] [address]  [place] 1-704-868-0867 Substance Abuse/Addiction Treatment   [facility]         Address  Phone  Notes  [facility]  (531)801-9854   Domenic Schwab, PhD [address] [place], [place]   339-255-6813 or (316)685-5878   [facility] [place], [place] 754-803-5520   [facility] [address], [place], [place] (551) [phone] Insurance/Medicaid/sponsorship through [facility] [address] [place]                                    [place], [place] 414-471-9841 [place] [address], [place] [phone]    Dr. Adele Schilder  ([phone]) [phone]   [facility] Dept. 1) [address] [place] 2) [place] 3)  [address], [place] [phone] [phone]  (231)[phone]   [place] 575-783-6053 or 858-513-2920 (After Hours)      Take the prescriptions as directed. Also take over the counter tylenol, as directed on packaging, as needed for discomfort. Apply moist heat or ice to the area(s) of discomfort, for 15 minutes at a time, several times per day for the next few days.  Do not fall asleep on a heating or ice pack.  Call your regular medical doctor tomorrow to schedule a follow up appointment in the next 2 to 3 days.  Return to the Emergency Department immediately if worsening.

## 2015-05-21 IMAGING — CT CT NECK W/ CM
3 of 4 series · 14 of 33 positions shown, 16 images · IV contrast (omnipaque)
Comparison: None.

CLINICAL DATA: Sore throat.  Left neck pain and swelling.

EXAM:
CT NECK WITH CONTRAST
TECHNIQUE: Multidetector CT imaging of the neck was performed using the
standard protocol following the bolus administration of intravenous
contrast.
CONTRAST:  80mL OMNIPAQUE IOHEXOL 300 MG/ML  SOLN

[Series 2: soft tissue neck 2.0 b31s · axial · 0.38mm/px · z∈[+49,+249]mm · 6 of 130 slices shown, 8 images]
[im 20/130  soft-tissue]
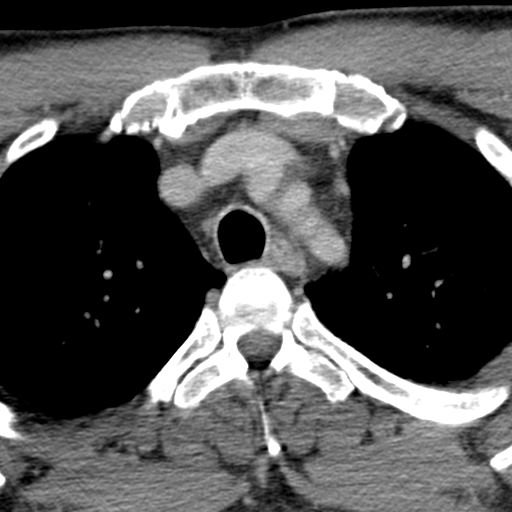
[im 20/130  bone]
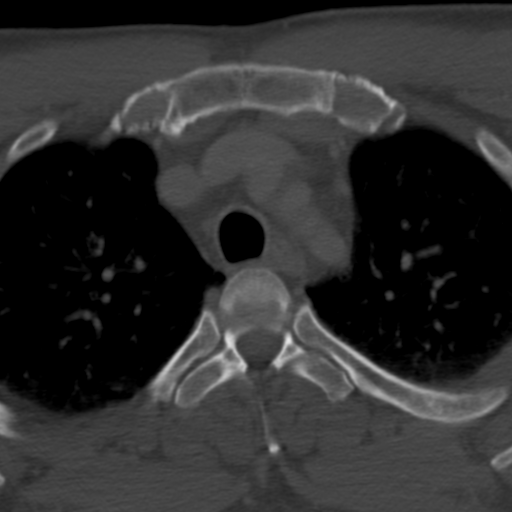
[im 40/130  bone]
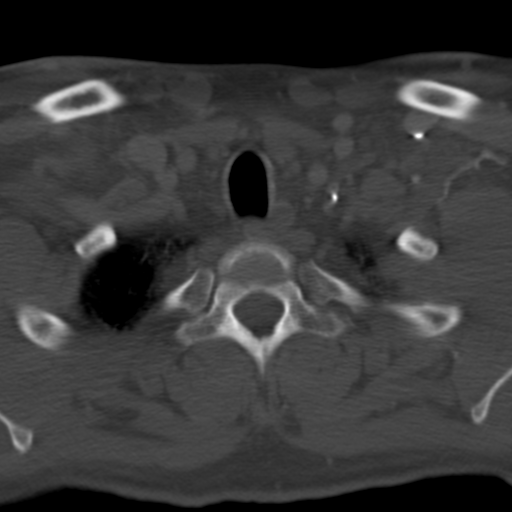
[im 60/130  bone]
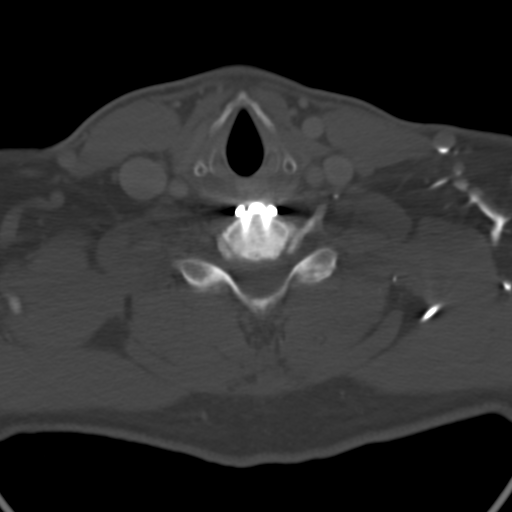
[im 80/130  bone]
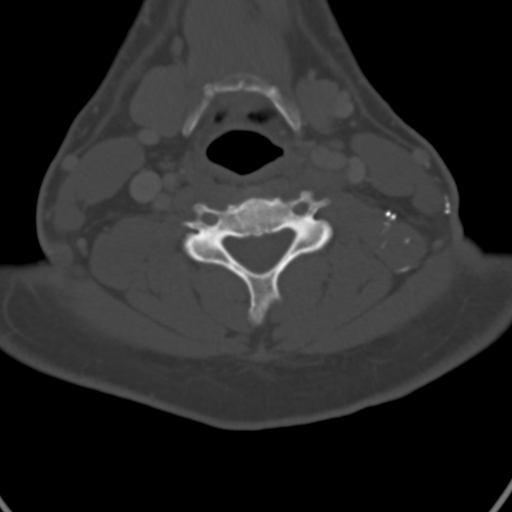
[im 100/130  soft-tissue]
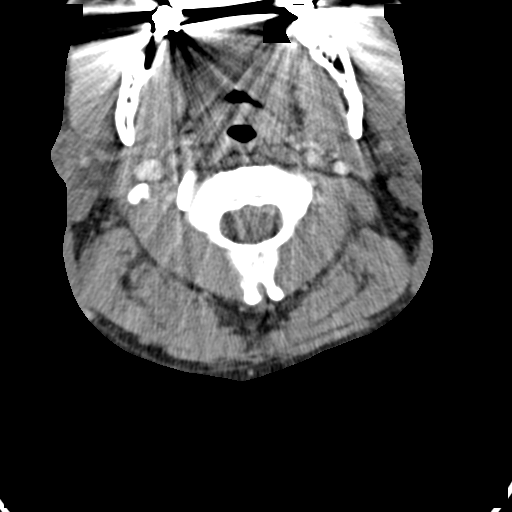
[im 100/130  bone]
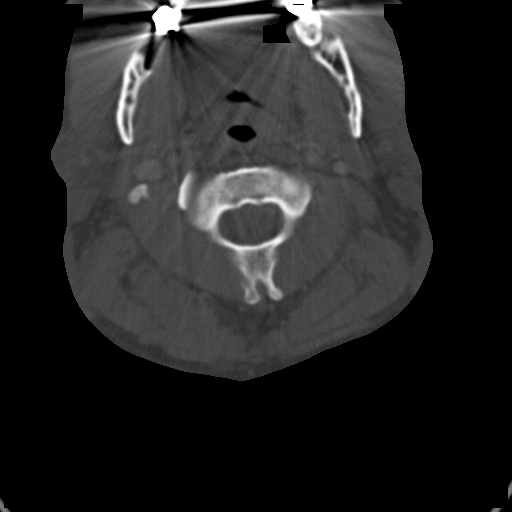
[im 120/130  bone]
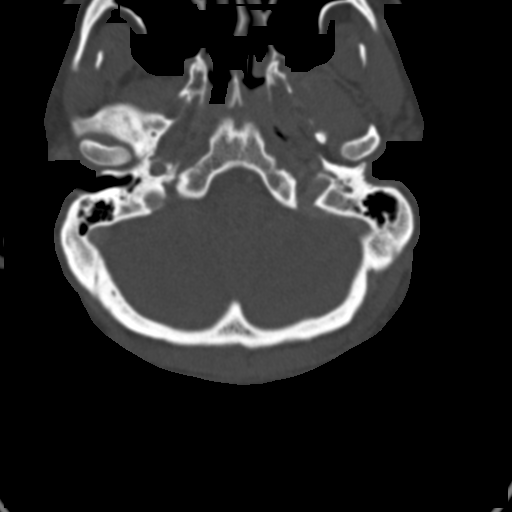

[Series 5: neck 2.0 soft tissue coro · coronal · 0.45mm/px · 3 of 91 slices shown]
[im 19/91  bone]
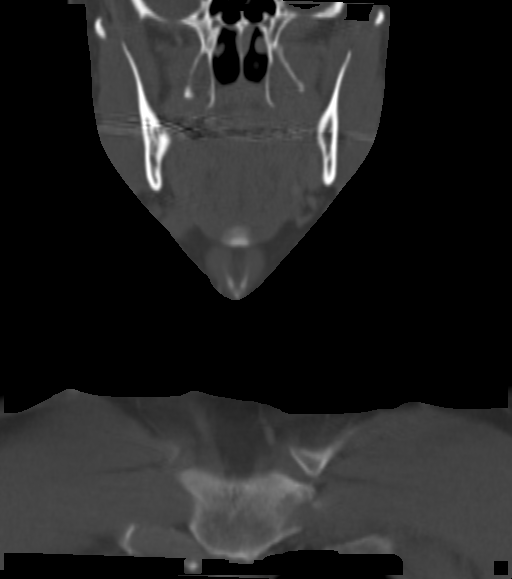
[im 37/91  bone]
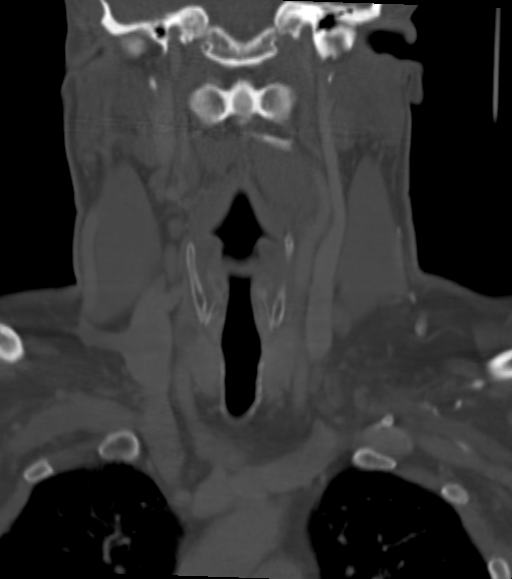
[im 55/91  bone]
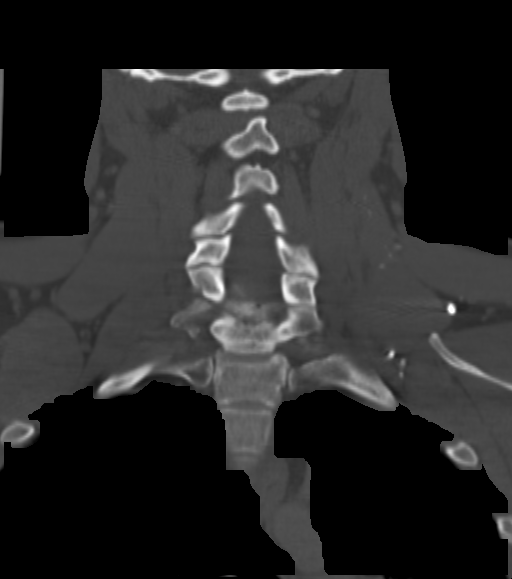

[Series 6: axial soft tissue neck 2.0 · sagittal · 0.37mm/px · 5 of 104 slices shown]
[im 18/104  bone]
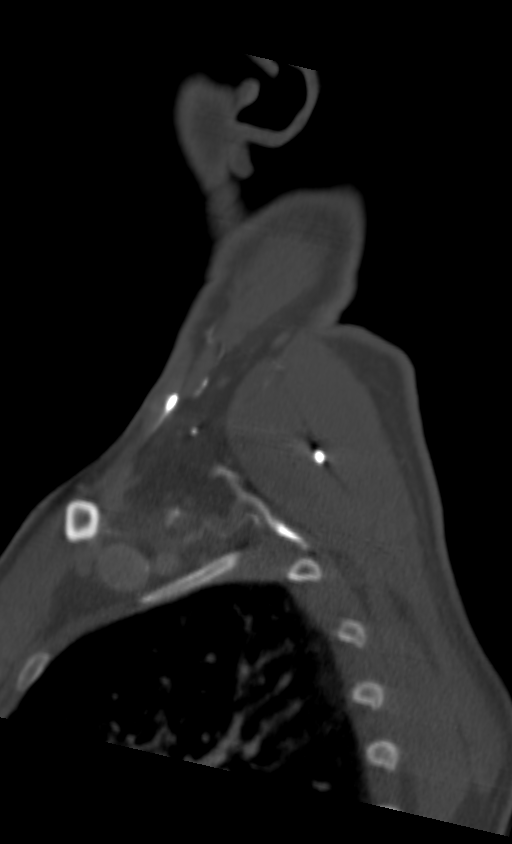
[im 35/104  bone]
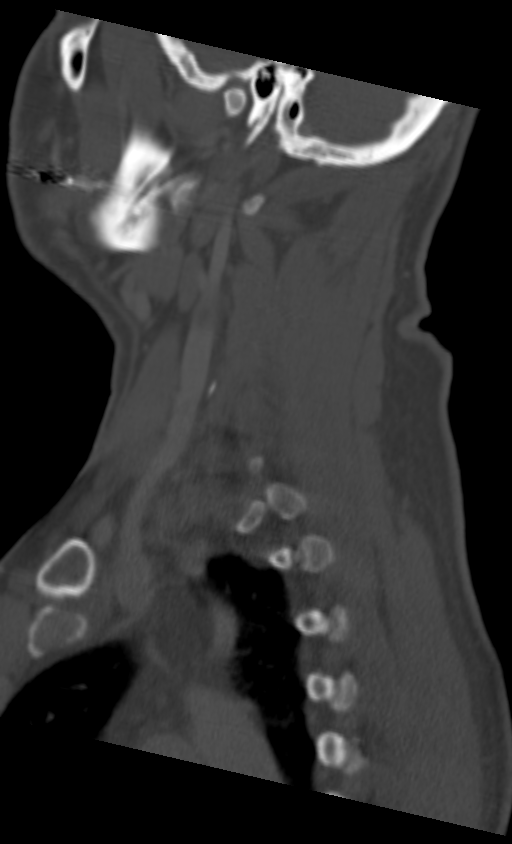
[im 52/104  bone]
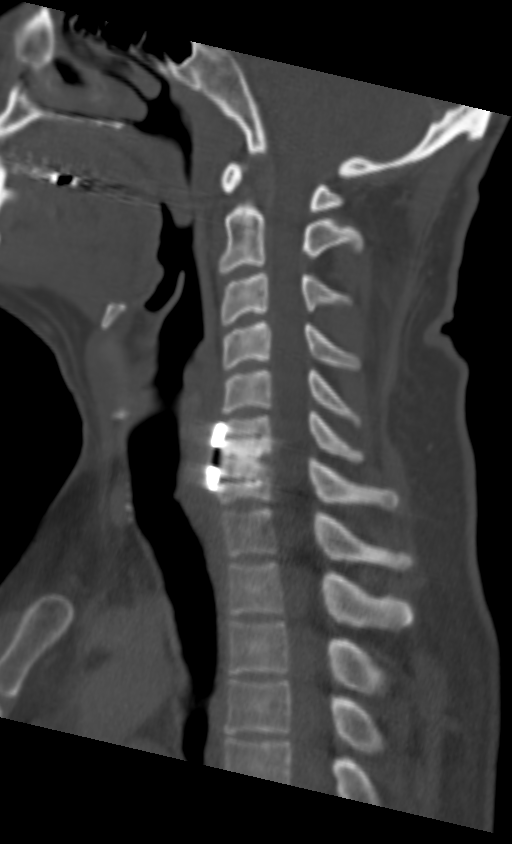
[im 69/104  bone]
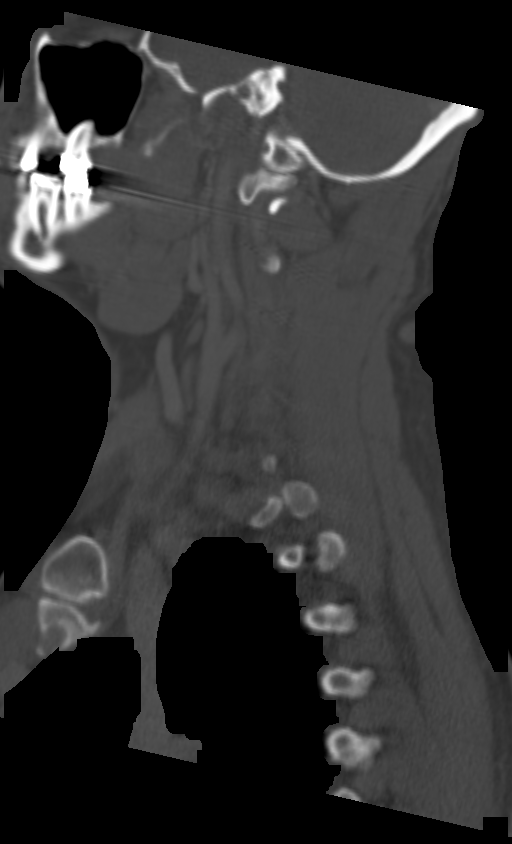
[im 86/104  bone]
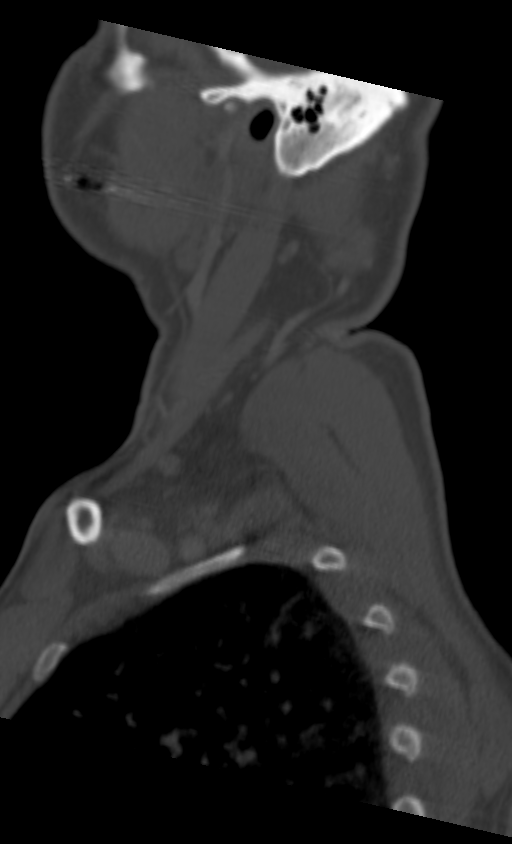

[14 of 33 positions shown; findings below may reference images not displayed]

FINDINGS: Pharynx and larynx: Negative

Salivary glands: Negative

Thyroid: 10 mm nodule right upper pole of the thyroid. 5 mm nodule
right midpole thyroid. Left lobe of the thyroid normal in size and
density.

Lymph nodes: No pathologic adenopathy. Right level 2 node 7 mm. Left
level 2 node 7 mm. Left lower level 5 node 7 mm.

Vascular: Negative

Limited intracranial: Negative

Visualized orbits: Not scanned

Mastoids and visualized paranasal sinuses: Negative

Skeleton: ACDF C6-7 with probable pseudarthrosis. No acute bony
change.

Upper chest: Negative
IMPRESSION: Negative for mass or adenopathy.

Right thyroid nodule. Recommend thyroid ultrasound for further
evaluation

ACDF C6-7 with probable pseudarthrosis.

## 2015-07-19 ENCOUNTER — Encounter (HOSPITAL_COMMUNITY)
Admission: RE | Admit: 2015-07-19 | Discharge: 2015-07-19 | Disposition: A | Discharge status: Home / Self Care | Payer: BLUE CROSS/BLUE SHIELD | Source: Ambulatory Visit | Attending: Orthopaedic Surgery | Admitting: Orthopaedic Surgery

## 2015-07-19 ENCOUNTER — Encounter (HOSPITAL_COMMUNITY): Payer: Self-pay

## 2015-07-19 ENCOUNTER — Other Ambulatory Visit (HOSPITAL_COMMUNITY): Payer: Self-pay | Admitting: *Deleted

## 2015-07-19 DIAGNOSIS — Z01812 Encounter for preprocedural laboratory examination: Secondary | ICD-10-CM | POA: Insufficient documentation

## 2015-07-19 HISTORY — DX: Hypothyroidism, unspecified: E03.9

## 2015-07-19 HISTORY — DX: Gastro-esophageal reflux disease without esophagitis: K21.9

## 2015-07-19 HISTORY — DX: Unspecified osteoarthritis, unspecified site: M19.90

## 2015-07-19 LAB — CBC
HCT: 43.7 % (ref 39.0–52.0)
Hemoglobin: 14.9 g/dL (ref 13.0–17.0)
MCH: 32 pg (ref 26.0–34.0)
MCHC: 34.1 g/dL (ref 30.0–36.0)
MCV: 94 fL (ref 78.0–100.0)
PLATELETS: 166 10*3/uL (ref 150–400)
RBC: 4.65 MIL/uL (ref 4.22–5.81)
RDW: 12.7 % (ref 11.5–15.5)
WBC: 6.3 10*3/uL (ref 4.0–10.5)

## 2015-07-19 LAB — SURGICAL PCR SCREEN
MRSA, PCR: NEGATIVE
STAPHYLOCOCCUS AUREUS: POSITIVE — AB

## 2015-07-19 LAB — BASIC METABOLIC PANEL
Anion gap: 8 (ref 5–15)
BUN: 15 mg/dL (ref 6–20)
CALCIUM: 9.8 mg/dL (ref 8.9–10.3)
CO2: 25 mmol/L (ref 22–32)
CREATININE: 1.02 mg/dL (ref 0.61–1.24)
Chloride: 107 mmol/L (ref 101–111)
GFR calc Af Amer: >60 mL/min (ref >60)
GFR calc non Af Amer: >60 mL/min (ref >60)
GLUCOSE: 93 mg/dL (ref 65–99)
Potassium: 3.9 mmol/L (ref 3.5–5.1)
Sodium: 140 mmol/L (ref 135–145)

## 2015-07-19 NOTE — Progress Notes (Signed)
Pt. Notified of results of PCR and Rx. Called to the [facility] in [place]

## 2015-07-19 NOTE — Pre-Procedure Instructions (Signed)
    Charles [name]  07/19/2015      [facility] [id number] - [place], [place] - [id number] [place] [address] [place] [address] Phone: [phone] Fax: [phone]    Your procedure is scheduled on 07-30-2015     Tuesday    Report to [facility] Admitting at 8:00 A.M.   Call this number if you have problems the morning of surgery:  [phone]     Remember:  Do not eat food or drink liquids after midnight.   Take these medicines the morning of surgery with A SIP OF WATER levothyroxine(Synthroid),Pain medication if needed           STOP ASPIRIN,ANTIINFLAMATORIES (IBUPROFEN,ALEVE,MOTRIN,ADVIL,GOODY'S POWDERS),HERBAL SUPPLEMENTS,FISH OIL,AND VITAMINS 5-7 DAYS PRIOR TO SURGERY   Do not wear jewelry,  Do not wear lotions, powders.  You may NOT wear deoderant.  Do not shave 48 hours prior to surgery.  Men may shave face and neck.   Do not bring valuables to the hospital.  [facility] is not responsible for any belongings or valuables.  Contacts, dentures or bridgework may not be worn into surgery.  Leave your suitcase in the car.  After surgery it may be brought to your room.  For patients admitted to the hospital, discharge time will be determined by your treatment team.  Patients discharged the day of surgery will not be allowed to drive home.    Special instructions:  See attached Sheet for instructions on CHG showers  Please read over the following fact sheets that you were given. MRSA Information and Surgical Site Infection Prevention

## 2015-07-25 NOTE — H&P (Signed)
CHIEF COMPLAINT:  Painful left hip.   HISTORY OF PRESENT ILLNESS:  Charles [name] is a very pleasant 45 year old white male who is seen today for evaluation of his left hip.  He has been previously seen with bilateral groin pain, left greater than right.  He is a patient of Dr. Estanislado [name] and Charles Nordmann, PA-C who was diagnosed with sacroiliitis and psoriasis as a child.  He was placed on Enbrel.  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  An MRI dated back to November of 2016 revealed stage I bilateral AVN.  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  Anterior/superior ligament tears of the right hip with a small paralabral cyst was also noted at that time.  He did have corticosteroid injection in June of 2017 and that was only limited in benefit.  He has now gotten to the point to where he is having pain with every step, nighttime pain, and pain with any activities.  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  His symptoms have gotten to the point now where he is unable to be very comfortable and the pain is worsening in the left hip.  He is seen today for evaluation.   CURRENT MEDICATIONS:  Levothyroxine 75 mcg daily, Percocet 10/325 up to 4 a day, Enbrel 50 mg once a week.   ALLERGIES: NO KNOWN DRUG ALLERGIES.   PAST MEDICAL/SURGICAL HISTORY:  In general his health is good.  Surgeries:  2009 cervical disk surgery, 2009 for arthroscopic debridement of the right shoulder.   REVIEW OF SYSTEMS:  A 14-point is positive for shortness of breath mainly that of dyspnea on exertion.  He is a cigarette smoker.  He had pneumonia as a child.  Bronchitis several years ago.  The last several years he was diagnosed with COPD.  He does have a morning cough at times.  He has had some chest pain, which was questioned as to whether this was GERD.  He is hypothyroid  and has been for the past year and is on replacement.  He does have a history of headaches and migraines.   FAMILY HISTORY:  Positive for a mother who is alive at age 25.  She has heart disease and has a myocardial infarction, as well as hypertension and gout.  Father is alive at 65 with heart disease, arthritis and prostate cancer.  He has a living brother at age 64.  Sisters age 30, 19, 56, 42, all in good health.   SOCIAL HISTORY:  Mr. Charles [name] is a 45 year old white married male, a Forensic psychologist.  He quit smoking cigarettes 2 months ago.  Prior to that he was smoking 1 pack of cigarettes per day for 20 years.  He does drink 2-4 times a week, 4-8 beers.   PHYSICAL EXAMINATION:  A 45 year old white male well-developed, well-nourished, alert, pleasant, cooperative, in moderate distress secondary to left hip pain.  Height is 71 inches, weight is 205, BMI is 28.6.  Vital signs:  Temperature is 97, pulse 88, respirations 12, blood pressure 138/88.  Head is normocephalic.  Eyes/pupils are equal, round, reactive to light and accommodation with extraocular movement intact.  Ears/Nose/Throat:  Benign.  Chest had good expansion.  Lungs had decreased breath sounds, but were clear.  Cardiac had a regular rhythm and rate, normal S1 and S2, no murmurs were  noted.  Pulses were 2+ bilaterally and symmetric in the lower extremities.  Abdomen and scaphoid were soft, nontender, no mass, palpable bowel sounds present.  CNS:  He was oriented x3 and cranial nerves II-VII were grossly intact.  Musculoskeletal:  At this time he has difficulty with sitting and has limited range of motion in the left hip.  He does not tolerate much in the way of any motion at all.  He is neurovascularly intact distally.   RADIOGRAPHS:  Studies of the pelvis reveals cystic changes of both femoral heads.  He is still maintaining a good joint space.  He does have sclerosing and irregularity of the SI joints bilaterally.   CLINICAL IMPRESSION:     1.  Avascular necrosis of the femoral heads bilaterally, left worse than right. 2.  History of psoriasis. 3.  COPD. 4.  Hypothyroidism.   RECOMMENDATIONS:  At this time I have reviewed and feel that he is a candidate for a left total hip arthroplasty.  Therefore the procedure, risks, and benefits were fully explained to him and he would like to proceed with this in the very near future.  All questions were answered in detail and I showed him the prosthesis that would be possibly used.    Charles [name] [address], [place] [phone]  07/25/2015 1:50 PM

## 2015-07-30 ENCOUNTER — Inpatient Hospital Stay (HOSPITAL_COMMUNITY): Payer: BLUE CROSS/BLUE SHIELD

## 2015-07-30 ENCOUNTER — Encounter (HOSPITAL_COMMUNITY)
Admission: RE | Disposition: A | Discharge status: Home Health | Payer: Self-pay | Source: Ambulatory Visit | Attending: Orthopaedic Surgery

## 2015-07-30 ENCOUNTER — Inpatient Hospital Stay (HOSPITAL_COMMUNITY)
Admission: RE | Admit: 2015-07-30 | Discharge: 2015-08-01 | DRG: 470 | Disposition: A | Discharge status: Home Health | Payer: BLUE CROSS/BLUE SHIELD | Source: Ambulatory Visit | Attending: Orthopaedic Surgery | Admitting: Orthopaedic Surgery

## 2015-07-30 ENCOUNTER — Inpatient Hospital Stay (HOSPITAL_COMMUNITY): Payer: BLUE CROSS/BLUE SHIELD | Admitting: Certified Registered"

## 2015-07-30 ENCOUNTER — Encounter (HOSPITAL_COMMUNITY): Payer: Self-pay | Admitting: *Deleted

## 2015-07-30 DIAGNOSIS — M879 Osteonecrosis, unspecified: Principal | ICD-10-CM | POA: Diagnosis present

## 2015-07-30 DIAGNOSIS — M25552 Pain in left hip: Secondary | ICD-10-CM | POA: Diagnosis present

## 2015-07-30 DIAGNOSIS — J449 Chronic obstructive pulmonary disease, unspecified: Secondary | ICD-10-CM | POA: Insufficient documentation

## 2015-07-30 DIAGNOSIS — L409 Psoriasis, unspecified: Secondary | ICD-10-CM | POA: Diagnosis present

## 2015-07-30 DIAGNOSIS — K219 Gastro-esophageal reflux disease without esophagitis: Secondary | ICD-10-CM | POA: Diagnosis present

## 2015-07-30 DIAGNOSIS — M87052 Idiopathic aseptic necrosis of left femur: Secondary | ICD-10-CM

## 2015-07-30 DIAGNOSIS — Z9889 Other specified postprocedural states: Secondary | ICD-10-CM

## 2015-07-30 DIAGNOSIS — M87051 Idiopathic aseptic necrosis of right femur: Secondary | ICD-10-CM | POA: Diagnosis present

## 2015-07-30 DIAGNOSIS — Z96642 Presence of left artificial hip joint: Secondary | ICD-10-CM

## 2015-07-30 DIAGNOSIS — E039 Hypothyroidism, unspecified: Secondary | ICD-10-CM | POA: Diagnosis present

## 2015-07-30 HISTORY — PX: TOTAL HIP ARTHROPLASTY: SHX124

## 2015-07-30 SURGERY — ARTHROPLASTY, HIP, TOTAL,POSTERIOR APPROACH
Anesthesia: Spinal | Site: Hip | Laterality: Left

## 2015-07-30 MED ORDER — METHOCARBAMOL 500 MG PO TABS
500.0000 mg | ORAL_TABLET | Freq: Four times a day (QID) | ORAL | Status: DC | PRN
  Administered 2015-07-30 – 2015-08-01 (×7): 500 mg via ORAL
  Filled 2015-07-30 (×6): qty 1

## 2015-07-30 MED ORDER — KETOROLAC TROMETHAMINE 15 MG/ML IJ SOLN
15.0000 mg | Freq: Four times a day (QID) | INTRAMUSCULAR | Status: AC
  Administered 2015-07-30 – 2015-07-31 (×4): 15 mg via INTRAVENOUS
  Filled 2015-07-30 (×3): qty 1

## 2015-07-30 MED ORDER — EPHEDRINE 5 MG/ML INJ
INTRAVENOUS | Status: AC
Start: 2015-07-30 — End: 2015-07-30
  Filled 2015-07-30: qty 10

## 2015-07-30 MED ORDER — DOCUSATE SODIUM 100 MG PO CAPS
100.0000 mg | ORAL_CAPSULE | Freq: Two times a day (BID) | ORAL | Status: DC
  Administered 2015-07-30 – 2015-08-01 (×5): 100 mg via ORAL
  Filled 2015-07-30 (×5): qty 1

## 2015-07-30 MED ORDER — MIDAZOLAM HCL 5 MG/5ML IJ SOLN
INTRAMUSCULAR | Status: DC | PRN
  Administered 2015-07-30: 2 mg via INTRAVENOUS

## 2015-07-30 MED ORDER — BUPIVACAINE IN DEXTROSE 0.75-8.25 % IT SOLN
INTRATHECAL | Status: DC | PRN
  Administered 2015-07-30: 2 mL via INTRATHECAL

## 2015-07-30 MED ORDER — HYDROMORPHONE HCL 1 MG/ML IJ SOLN
0.5000 mg | INTRAMUSCULAR | Status: DC | PRN
  Administered 2015-07-30 – 2015-08-01 (×10): 1 mg via INTRAVENOUS
  Filled 2015-07-30 (×10): qty 1

## 2015-07-30 MED ORDER — ACETAMINOPHEN 10 MG/ML IV SOLN
1000.0000 mg | Freq: Once | INTRAVENOUS | Status: AC
  Administered 2015-07-30: 1000 mg via INTRAVENOUS
  Filled 2015-07-30: qty 100

## 2015-07-30 MED ORDER — PROPOFOL 10 MG/ML IV BOLUS
INTRAVENOUS | Status: DC | PRN
  Administered 2015-07-30: 40 mg via INTRAVENOUS
  Administered 2015-07-30 (×2): 20 mg via INTRAVENOUS

## 2015-07-30 MED ORDER — CEFAZOLIN SODIUM 1 G IJ SOLR
INTRAMUSCULAR | Status: AC
  Filled 2015-07-30: qty 20

## 2015-07-30 MED ORDER — ALUM & MAG HYDROXIDE-SIMETH 200-200-20 MG/5ML PO SUSP: 30.0000 mL | ORAL | Status: DC | PRN

## 2015-07-30 MED ORDER — FENTANYL CITRATE (PF) 100 MCG/2ML IJ SOLN
INTRAMUSCULAR | Status: DC | PRN
  Administered 2015-07-30 (×4): 50 ug via INTRAVENOUS

## 2015-07-30 MED ORDER — ACETAMINOPHEN 10 MG/ML IV SOLN
1000.0000 mg | Freq: Four times a day (QID) | INTRAVENOUS | Status: AC
  Administered 2015-07-30 – 2015-07-31 (×3): 1000 mg via INTRAVENOUS
  Filled 2015-07-30 (×3): qty 100

## 2015-07-30 MED ORDER — METHOCARBAMOL 1000 MG/10ML IJ SOLN
500.0000 mg | Freq: Four times a day (QID) | INTRAVENOUS | Status: DC | PRN
  Filled 2015-07-30: qty 5

## 2015-07-30 MED ORDER — MAGNESIUM HYDROXIDE 400 MG/5ML PO SUSP: 30.0000 mL | Freq: Every day | ORAL | Status: DC | PRN

## 2015-07-30 MED ORDER — DIPHENHYDRAMINE HCL 12.5 MG/5ML PO ELIX
12.5000 mg | ORAL_SOLUTION | ORAL | Status: DC | PRN
Start: 2015-07-30 — End: 2015-08-01
  Administered 2015-08-01: 25 mg via ORAL
  Filled 2015-07-30: qty 10

## 2015-07-30 MED ORDER — BUPIVACAINE-EPINEPHRINE (PF) 0.25% -1:200000 IJ SOLN
INTRAMUSCULAR | Status: DC | PRN
  Administered 2015-07-30: 30 mL

## 2015-07-30 MED ORDER — BISACODYL 10 MG RE SUPP: 10.0000 mg | Freq: Every day | RECTAL | Status: DC | PRN

## 2015-07-30 MED ORDER — HYDROMORPHONE HCL 1 MG/ML IJ SOLN
INTRAMUSCULAR | Status: AC
Start: 2015-07-30 — End: 2015-07-31
  Filled 2015-07-30: qty 1

## 2015-07-30 MED ORDER — PHENYLEPHRINE HCL 10 MG/ML IJ SOLN
INTRAMUSCULAR | Status: DC | PRN
  Administered 2015-07-30: 40 ug via INTRAVENOUS
  Administered 2015-07-30 (×2): 80 ug via INTRAVENOUS
  Administered 2015-07-30: 120 ug via INTRAVENOUS
  Administered 2015-07-30: 80 ug via INTRAVENOUS

## 2015-07-30 MED ORDER — CEFAZOLIN IN D5W 1 GM/50ML IV SOLN
1.0000 g | Freq: Four times a day (QID) | INTRAVENOUS | Status: AC
  Administered 2015-07-30 (×2): 1 g via INTRAVENOUS
  Filled 2015-07-30 (×2): qty 50

## 2015-07-30 MED ORDER — ONDANSETRON HCL 4 MG/2ML IJ SOLN: 4.0000 mg | Freq: Four times a day (QID) | INTRAMUSCULAR | Status: DC | PRN

## 2015-07-30 MED ORDER — CEFAZOLIN SODIUM 1 G IJ SOLR
INTRAMUSCULAR | Status: DC | PRN
  Administered 2015-07-30: 2 g via INTRAMUSCULAR

## 2015-07-30 MED ORDER — TRANEXAMIC ACID 1000 MG/10ML IV SOLN
INTRAVENOUS | Status: DC | PRN
  Administered 2015-07-30: 2000 mg via TOPICAL

## 2015-07-30 MED ORDER — METHOCARBAMOL 500 MG PO TABS
ORAL_TABLET | ORAL | Status: AC
  Filled 2015-07-30: qty 1

## 2015-07-30 MED ORDER — EPHEDRINE SULFATE 50 MG/ML IJ SOLN
INTRAMUSCULAR | Status: DC | PRN
  Administered 2015-07-30: 5 mg via INTRAVENOUS

## 2015-07-30 MED ORDER — ONDANSETRON HCL 4 MG/2ML IJ SOLN
INTRAMUSCULAR | Status: DC | PRN
  Administered 2015-07-30: 4 mg via INTRAVENOUS

## 2015-07-30 MED ORDER — METOCLOPRAMIDE HCL 5 MG PO TABS: 5.0000 mg | ORAL_TABLET | Freq: Three times a day (TID) | ORAL | Status: DC | PRN

## 2015-07-30 MED ORDER — ACETAMINOPHEN 10 MG/ML IV SOLN
INTRAVENOUS | Status: AC
  Filled 2015-07-30: qty 100

## 2015-07-30 MED ORDER — METOCLOPRAMIDE HCL 5 MG/ML IJ SOLN: 5.0000 mg | Freq: Three times a day (TID) | INTRAMUSCULAR | Status: DC | PRN

## 2015-07-30 MED ORDER — RIVAROXABAN 10 MG PO TABS
10.0000 mg | ORAL_TABLET | Freq: Every day | ORAL | Status: DC
  Administered 2015-07-31 – 2015-08-01 (×2): 10 mg via ORAL
  Filled 2015-07-30 (×2): qty 1

## 2015-07-30 MED ORDER — HYDROMORPHONE HCL 2 MG PO TABS
2.0000 mg | ORAL_TABLET | ORAL | Status: DC | PRN
  Administered 2015-07-30 – 2015-07-31 (×3): 2 mg via ORAL
  Filled 2015-07-30 (×3): qty 1

## 2015-07-30 MED ORDER — MENTHOL 3 MG MT LOZG: 1.0000 | LOZENGE | OROMUCOSAL | Status: DC | PRN

## 2015-07-30 MED ORDER — ACETAMINOPHEN 325 MG PO TABS: 650.0000 mg | ORAL_TABLET | Freq: Four times a day (QID) | ORAL | Status: DC | PRN

## 2015-07-30 MED ORDER — PROPOFOL 500 MG/50ML IV EMUL
INTRAVENOUS | Status: DC | PRN
  Administered 2015-07-30: 100 ug/kg/min via INTRAVENOUS

## 2015-07-30 MED ORDER — BUPIVACAINE-EPINEPHRINE (PF) 0.25% -1:200000 IJ SOLN
INTRAMUSCULAR | Status: AC
  Filled 2015-07-30: qty 30

## 2015-07-30 MED ORDER — ACETAMINOPHEN 650 MG RE SUPP: 650.0000 mg | Freq: Four times a day (QID) | RECTAL | Status: DC | PRN

## 2015-07-30 MED ORDER — PROPOFOL 1000 MG/100ML IV EMUL
INTRAVENOUS | Status: AC
  Filled 2015-07-30: qty 200

## 2015-07-30 MED ORDER — PHENOL 1.4 % MT LIQD: 1.0000 | OROMUCOSAL | Status: DC | PRN

## 2015-07-30 MED ORDER — HYDROMORPHONE HCL 1 MG/ML IJ SOLN
0.2500 mg | INTRAMUSCULAR | Status: DC | PRN
  Administered 2015-07-30 (×2): 0.5 mg via INTRAVENOUS

## 2015-07-30 MED ORDER — SODIUM CHLORIDE 0.9 % IV SOLN
75.0000 mL/h | INTRAVENOUS | Status: DC
  Administered 2015-07-30: 75 mL/h via INTRAVENOUS

## 2015-07-30 MED ORDER — ONDANSETRON HCL 4 MG/2ML IJ SOLN
INTRAMUSCULAR | Status: AC
  Filled 2015-07-30: qty 2

## 2015-07-30 MED ORDER — PHENYLEPHRINE HCL 10 MG/ML IJ SOLN
INTRAVENOUS | Status: DC | PRN
  Administered 2015-07-30: 50 ug/min via INTRAVENOUS

## 2015-07-30 MED ORDER — LACTATED RINGERS IV SOLN
INTRAVENOUS | Status: DC
  Administered 2015-07-30 (×2): via INTRAVENOUS

## 2015-07-30 MED ORDER — LEVOTHYROXINE SODIUM 75 MCG PO TABS
75.0000 ug | ORAL_TABLET | Freq: Every day | ORAL | Status: DC
  Administered 2015-07-31 – 2015-08-01 (×2): 75 ug via ORAL
  Filled 2015-07-30 (×2): qty 1

## 2015-07-30 MED ORDER — SODIUM CHLORIDE 0.9 % IR SOLN
Status: DC | PRN
  Administered 2015-07-30: 1000 mL

## 2015-07-30 MED ORDER — KETOROLAC TROMETHAMINE 15 MG/ML IJ SOLN
INTRAMUSCULAR | Status: AC
  Filled 2015-07-30: qty 1

## 2015-07-30 MED ORDER — LIDOCAINE HCL (CARDIAC) 20 MG/ML IV SOLN
INTRAVENOUS | Status: DC | PRN
  Administered 2015-07-30: 20 mg via INTRAVENOUS
  Administered 2015-07-30: 40 mg via INTRAVENOUS

## 2015-07-30 MED ORDER — FENTANYL CITRATE (PF) 250 MCG/5ML IJ SOLN
INTRAMUSCULAR | Status: AC
  Filled 2015-07-30: qty 5

## 2015-07-30 MED ORDER — ONDANSETRON HCL 4 MG PO TABS: 4.0000 mg | ORAL_TABLET | Freq: Four times a day (QID) | ORAL | Status: DC | PRN

## 2015-07-30 MED ORDER — MIDAZOLAM HCL 2 MG/2ML IJ SOLN
INTRAMUSCULAR | Status: AC
  Filled 2015-07-30: qty 2

## 2015-07-30 MED ORDER — LIDOCAINE 2% (20 MG/ML) 5 ML SYRINGE
INTRAMUSCULAR | Status: AC
  Filled 2015-07-30: qty 5

## 2015-07-30 MED ORDER — MAGNESIUM CITRATE PO SOLN: 1.0000 | Freq: Once | ORAL | Status: DC | PRN

## 2015-07-30 MED ORDER — TRANEXAMIC ACID 1000 MG/10ML IV SOLN
2000.0000 mg | INTRAVENOUS | Status: DC
  Filled 2015-07-30: qty 20

## 2015-07-30 SURGICAL SUPPLY — 57 items
BLADE SAW SAG 73X25 THK (BLADE) ×1
BLADE SAW SGTL 73X25 THK (BLADE) ×1 IMPLANT
BLADE SURG 10 STRL SS (BLADE) ×2 IMPLANT
BRUSH FEMORAL CANAL (MISCELLANEOUS) IMPLANT
CAPT HIP TOTAL 2 ×1 IMPLANT
COVER SURGICAL LIGHT HANDLE (MISCELLANEOUS) ×2 IMPLANT
DRAPE INCISE IOBAN 66X45 STRL (DRAPES) IMPLANT
DRAPE ORTHO SPLIT 77X108 STRL (DRAPES) ×4
DRAPE SURG ORHT 6 SPLT 77X108 (DRAPES) ×2 IMPLANT
DRSG MEPILEX BORDER 4X12 (GAUZE/BANDAGES/DRESSINGS) ×2 IMPLANT
DRSG MEPILEX BORDER 4X8 (GAUZE/BANDAGES/DRESSINGS) ×1 IMPLANT
DURAPREP 26ML APPLICATOR (WOUND CARE) ×4 IMPLANT
ELECT BLADE 6.5 EXT (BLADE) IMPLANT
ELECT REM PT RETURN 9FT ADLT (ELECTROSURGICAL) ×2
ELECTRODE REM PT RTRN 9FT ADLT (ELECTROSURGICAL) ×1 IMPLANT
EVACUATOR 1/8 PVC DRAIN (DRAIN) IMPLANT
FACESHIELD WRAPAROUND (MASK) ×6 IMPLANT
FACESHIELD WRAPAROUND OR TEAM (MASK) ×3 IMPLANT
GLOVE BIOGEL PI IND STRL 8 (GLOVE) ×2 IMPLANT
GLOVE BIOGEL PI IND STRL 8.5 (GLOVE) ×1 IMPLANT
GLOVE BIOGEL PI INDICATOR 8 (GLOVE) ×2
GLOVE BIOGEL PI INDICATOR 8.5 (GLOVE) ×1
GLOVE ECLIPSE 8.0 STRL XLNG CF (GLOVE) ×4 IMPLANT
GLOVE SURG ORTHO 8.5 STRL (GLOVE) ×4 IMPLANT
GOWN STRL REUS W/ TWL LRG LVL3 (GOWN DISPOSABLE) ×2 IMPLANT
GOWN STRL REUS W/TWL 2XL LVL3 (GOWN DISPOSABLE) ×4 IMPLANT
GOWN STRL REUS W/TWL LRG LVL3 (GOWN DISPOSABLE) ×4
HANDPIECE INTERPULSE COAX TIP (DISPOSABLE)
IMMOBILIZER KNEE 20 (SOFTGOODS) IMPLANT
IMMOBILIZER KNEE 22 UNIV (SOFTGOODS) ×2 IMPLANT
KIT BASIN OR (CUSTOM PROCEDURE TRAY) ×2 IMPLANT
KIT ROOM TURNOVER OR (KITS) ×2 IMPLANT
MANIFOLD NEPTUNE II (INSTRUMENTS) ×2 IMPLANT
NEEDLE 22X1 1/2 (OR ONLY) (NEEDLE) ×2 IMPLANT
NS IRRIG 1000ML POUR BTL (IV SOLUTION) ×2 IMPLANT
PACK TOTAL JOINT (CUSTOM PROCEDURE TRAY) ×2 IMPLANT
PACK UNIVERSAL I (CUSTOM PROCEDURE TRAY) ×2 IMPLANT
PAD ARMBOARD 7.5X6 YLW CONV (MISCELLANEOUS) ×2 IMPLANT
PRESSURIZER FEMORAL UNIV (MISCELLANEOUS) IMPLANT
SET HNDPC FAN SPRY TIP SCT (DISPOSABLE) IMPLANT
STAPLER VISISTAT 35W (STAPLE) IMPLANT
SUCTION FRAZIER HANDLE 10FR (MISCELLANEOUS) ×1
SUCTION TUBE FRAZIER 10FR DISP (MISCELLANEOUS) ×1 IMPLANT
SUT BONE WAX W31G (SUTURE) IMPLANT
SUT ETHIBOND NAB CT1 #1 30IN (SUTURE) ×7 IMPLANT
SUT MNCRL AB 3-0 PS2 18 (SUTURE) ×2 IMPLANT
SUT VIC AB 0 CT1 27 (SUTURE) ×4
SUT VIC AB 0 CT1 27XBRD ANBCTR (SUTURE) ×2 IMPLANT
SUT VIC AB 1 CT1 27 (SUTURE) ×4
SUT VIC AB 1 CT1 27XBRD ANBCTR (SUTURE) ×2 IMPLANT
SUT VIC AB 2-0 CT1 27 (SUTURE) ×2
SUT VIC AB 2-0 CT1 TAPERPNT 27 (SUTURE) ×1 IMPLANT
SYR CONTROL 10ML LL (SYRINGE) ×2 IMPLANT
TOWEL OR 17X24 6PK STRL BLUE (TOWEL DISPOSABLE) ×2 IMPLANT
TOWEL OR 17X26 10 PK STRL BLUE (TOWEL DISPOSABLE) ×2 IMPLANT
TOWER CARTRIDGE SMART MIX (DISPOSABLE) IMPLANT
WATER STERILE IRR 1000ML POUR (IV SOLUTION) ×8 IMPLANT

## 2015-07-30 NOTE — Anesthesia Postprocedure Evaluation (Signed)
Anesthesia Post Note  Patient: Charles [name]  Procedure(s) Performed: Procedure(s) (LRB): TOTAL HIP ARTHROPLASTY (Left)  Patient location during evaluation: PACU Anesthesia Type: Spinal Level of consciousness: awake Pain management: satisfactory to patient Vital Signs Assessment: post-procedure vital signs reviewed and stable Respiratory status: spontaneous breathing Cardiovascular status: blood pressure returned to baseline Postop Assessment: no headache and spinal receding Anesthetic complications: no    Last Vitals:  Vitals:   07/30/15 1445 07/30/15 1500  BP: 115/68 113/69  Pulse: 67 (!) 58  Resp: 16 10  Temp:      Last Pain:  Vitals:   07/30/15 1445  TempSrc:   PainSc: 0-No pain                 Flois Mctague EDWARD

## 2015-07-30 NOTE — Evaluation (Signed)
Physical Therapy Evaluation Patient Details Name: Charles [name] MRN: [id number] DOB: [birth year] Today's Date: 07/30/2015   History of Present Illness  Pt is a 45 y/o M s/p Lt THA.  Pt's PMH includes COPD, psoriasis, Bil femoral head AVN.   Clinical Impression  Pt is s/p Lt THA resulting in the deficits listed below (see PT Problem List). Mr. Havron ambulated 300 ft at supervision level.  He will have 24/7 assist/supervision available at d/c from his wife and children.  Pt will benefit from skilled PT to increase their independence and safety with mobility to allow discharge to the venue listed below.     Follow Up Recommendations Home health PT;Supervision for mobility/OOB    Equipment Recommendations  Rolling walker with 5" wheels;3in1 (PT)    Recommendations for Other Services OT consult     Precautions / Restrictions Precautions Precautions: Fall;Posterior Hip Precaution Booklet Issued: Yes (comment) Precaution Comments: Provided pt with handout and reviewed hip precautions  Required Braces or Orthoses: Knee Immobilizer - Left Knee Immobilizer - Left: On at all times Restrictions Weight Bearing Restrictions: Yes LLE Weight Bearing: Weight bearing as tolerated      Mobility  Bed Mobility Overal bed mobility: Needs Assistance Bed Mobility: Supine to Sit;Sit to Supine     Supine to sit: Min guard;HOB elevated Sit to supine: Min guard   General bed mobility comments: Pt entered and exited bed on both sides with cues to adhere to hip precautions.    Transfers Overall transfer level: Needs assistance Equipment used: Rolling walker (2 wheeled) Transfers: Sit to/from Stand Sit to Stand: Supervision;From elevated surface         General transfer comment: Cues for hand placement and bed raised as pt has high bed at home.  Ambulation/Gait Ambulation/Gait assistance: Supervision Ambulation Distance (Feet): 300 Feet Assistive device: Rolling walker (2 wheeled) Gait  Pattern/deviations: Step-through pattern;Decreased stride length;Decreased weight shift to left;Decreased stance time - left;Decreased step length - right Gait velocity: decreased   General Gait Details: Cues for upright posture and supervision for safety  Stairs            Wheelchair Mobility    Modified Rankin (Stroke Patients Only)       Balance Overall balance assessment: Needs assistance Sitting-balance support: No upper extremity supported;Feet supported Sitting balance-[name]     Standing balance support: No upper extremity supported;During functional activity Standing balance-Leahy Scale: Fair Standing balance comment: Pt able to void in standing without UE support                             Pertinent Vitals/Pain Pain Assessment: 0-10 Pain Score: 10-Worst pain ever Pain Location: Lt hip Pain Descriptors / Indicators: Aching Pain Intervention(s): Limited activity within patient's tolerance;Monitored during session;Repositioned    Home Living Family/patient expects to be discharged to:: Private residence Living Arrangements: Spouse/significant other;Children (3 children: aged 22, 81, 25) Available Help at Discharge: Family;Available 24 hours/day Type of Home: House Home Access: Stairs to enter Entrance Stairs-Rails: None Entrance Stairs-Number of Steps: 1 Home Layout: Laundry or work area in basement;Two level Home Equipment: Cane - single point (quad pod cane)      Prior Function Level of Independence: Independent with assistive device(s)         Comments: Using cane when in pain.  Works full-time.       Hand Dominance        Extremity/Trunk Assessment   Upper  Extremity Assessment: Defer to OT evaluation           Lower Extremity Assessment: LLE deficits/detail   LLE Deficits / Details: limited ROM and strength as expected s/p Lt THA     Communication   Communication: No difficulties  Cognition  Arousal/Alertness: Awake/alert Behavior During Therapy: WFL for tasks assessed/performed Overall Cognitive Status: Within Functional Limits for tasks assessed                      General Comments      Exercises Total Joint Exercises Ankle Circles/Pumps: AROM;Both;10 reps;Supine Gluteal Sets: Strengthening;Both;10 reps;Supine      Assessment/Plan    PT Assessment Patient needs continued PT services  PT Diagnosis Difficulty walking;Acute pain;Abnormality of gait   PT Problem List Decreased strength;Decreased range of motion;Decreased activity tolerance;Decreased balance;Decreased mobility;Decreased knowledge of use of DME;Decreased safety awareness;Decreased knowledge of precautions;Pain  PT Treatment Interventions DME instruction;Gait training;Stair training;Functional mobility training;Therapeutic activities;Balance training;Therapeutic exercise;Neuromuscular re-education;Patient/family education;Modalities   PT Goals (Current goals can be found in the Care Plan section) Acute Rehab PT Goals Patient Stated Goal: to go home and to get back to golfing PT Goal Formulation: With patient/family Time For Goal Achievement: 08/06/15 Potential to Achieve Goals: Good    Frequency 7X/week   Barriers to discharge Inaccessible home environment 1 step to enter home    Co-evaluation               End of Session Equipment Utilized During Treatment: Gait belt;Left knee immobilizer Activity Tolerance: Patient tolerated treatment well Patient left: in bed;with call bell/phone within reach;with bed alarm set;with family/visitor present Nurse Communication: Mobility status;Weight bearing status;Precautions         Time: [id number] PT Time Calculation (min) (ACUTE ONLY): 42 min   Charges:   PT Evaluation $PT Eval Low Complexity: 1 Procedure PT Treatments $Gait Training: 23-37 mins   PT G Codes:       Collie Siad PT, DPT  Pager: [phone] Phone:  [phone] 07/30/2015, 5:05 PM

## 2015-07-30 NOTE — Transfer of Care (Signed)
Immediate Anesthesia Transfer of Care Note  Patient: Charles [name]  Procedure(s) Performed: Procedure(s): TOTAL HIP ARTHROPLASTY (Left)  Patient Location: PACU  Anesthesia Type:Spinal  Level of Consciousness:  sedated, patient cooperative and responds to stimulation  Airway & Oxygen Therapy:Patient Spontanous Breathing and Patient connected to face mask oxgen  Post-op Assessment:  Report given to PACU RN and Post -op Vital signs reviewed and stable  Post vital signs:  Reviewed and stable  Last Vitals:  Vitals:   07/30/15 0829 07/30/15 1245  BP: 139/88 127/78  Pulse: 73 98  Resp: 18 13  Temp: 37 C     Complications: No apparent anesthesia complications

## 2015-07-30 NOTE — Op Note (Signed)
PATIENT ID:      Charles [name]  MRN:     [id number] DOB/AGE:    1970-02-20 / 45 y.o.       OPERATIVE REPORT    DATE OF PROCEDURE:  07/30/2015       PREOPERATIVE DIAGNOSIS:   LEFT HIP AVASCULAR NECROSIS                                                       Estimated body mass index is 27.94 kg/m as calculated from the following:   Height as of this encounter: 6' (1.829 m).   Weight as of this encounter: 93.4 kg (206 lb).     POSTOPERATIVE DIAGNOSIS:   LEFT HIP AVASCULAR NECROSIS                                                                     Estimated body mass index is 27.94 kg/m as calculated from the following:   Height as of this encounter: 6' (1.829 m).   Weight as of this encounter: 93.4 kg (206 lb).     PROCEDURE:  Procedure(s):LEFT TOTAL HIP ARTHROPLASTY      SURGEON:  Joni Fears, MD    ASSISTANT:   Biagio Borg, PA-C   (Present and scrubbed throughout the case, critical for assistance with exposure, retraction, instrumentation, and closure.)          ANESTHESIA: spinal and IV sedation     DRAINS: none :      TOURNIQUET TIME: * No tourniquets in log *    COMPLICATIONS:  None   CONDITION:  stable  PROCEDURE IN DETAIL: Charles [name], Charles [name] 07/30/2015, 12:21 PM

## 2015-07-30 NOTE — Anesthesia Preprocedure Evaluation (Signed)
Anesthesia Evaluation  Patient identified by MRN, date of birth, ID band Patient awake    Reviewed: Allergy & Precautions, H&P , Patient's Chart, lab work & pertinent test results  Airway Mallampati: II  TM Distance: >3 FB Neck ROM: full    Dental no notable dental hx.    Pulmonary former smoker,    Pulmonary exam normal breath sounds clear to auscultation       Cardiovascular Exercise Tolerance: Good  Rhythm:regular Rate:Normal     Neuro/Psych    GI/Hepatic   Endo/Other    Renal/GU      Musculoskeletal   Abdominal   Peds  Hematology   Anesthesia Other Findings   Reproductive/Obstetrics                             Anesthesia Physical Anesthesia Plan  ASA: II  Anesthesia Plan: Spinal   Post-op Pain Management:    Induction:   Airway Management Planned:   Additional Equipment:   Intra-op Plan:   Post-operative Plan:   Informed Consent: I have reviewed the patients History and Physical, chart, labs and discussed the procedure including the risks, benefits and alternatives for the proposed anesthesia with the patient or authorized representative who has indicated his/her understanding and acceptance.   Dental Advisory Given  Plan Discussed with: CRNA  Anesthesia Plan Comments: (Lab work confirmed with CRNA in room. Platelets okay. Discussed spinal anesthetic, and patient consents to the procedure:  included risk of possible headache,backache, failed block, allergic reaction, and nerve injury. This patient was asked if she had any questions or concerns before the procedure started. )        Anesthesia Quick Evaluation

## 2015-07-30 NOTE — Anesthesia Procedure Notes (Signed)
Spinal  Patient location during procedure: OR Preanesthetic Checklist Completed: patient identified, site marked, surgical consent, pre-op evaluation, timeout performed, IV checked, risks and benefits discussed and monitors and equipment checked Spinal Block Patient position: sitting Prep: DuraPrep Patient monitoring: heart rate, cardiac monitor, continuous pulse ox and blood pressure Approach: midline Location: L3-4 Injection technique: single-shot Needle Needle type: Sprotte  Needle gauge: 24 G Needle length: 9 cm Assessment Sensory level: T8 Additional Notes Spinal Dosage in OR LLD x 2 Min  Bupivicaine ml       1.9

## 2015-07-31 ENCOUNTER — Encounter (HOSPITAL_COMMUNITY): Payer: Self-pay | Admitting: Orthopaedic Surgery

## 2015-07-31 LAB — CBC
HCT: 35.2 % — ABNORMAL LOW (ref 39.0–52.0)
HEMOGLOBIN: 11.4 g/dL — AB (ref 13.0–17.0)
MCH: 30.6 pg (ref 26.0–34.0)
MCHC: 32.4 g/dL (ref 30.0–36.0)
MCV: 94.6 fL (ref 78.0–100.0)
Platelets: 141 10*3/uL — ABNORMAL LOW (ref 150–400)
RBC: 3.72 MIL/uL — AB (ref 4.22–5.81)
RDW: 13 % (ref 11.5–15.5)
WBC: 6.6 10*3/uL (ref 4.0–10.5)

## 2015-07-31 LAB — BASIC METABOLIC PANEL
Anion gap: 9 (ref 5–15)
BUN: 10 mg/dL (ref 6–20)
CHLORIDE: 103 mmol/L (ref 101–111)
CO2: 25 mmol/L (ref 22–32)
CREATININE: 0.89 mg/dL (ref 0.61–1.24)
Calcium: 8.4 mg/dL — ABNORMAL LOW (ref 8.9–10.3)
GFR calc non Af Amer: >60 mL/min (ref >60)
Glucose, Bld: 97 mg/dL (ref 65–99)
POTASSIUM: 3.7 mmol/L (ref 3.5–5.1)
SODIUM: 137 mmol/L (ref 135–145)

## 2015-07-31 MED ORDER — OXYCODONE HCL 5 MG PO TABS
5.0000 mg | ORAL_TABLET | ORAL | Status: DC | PRN
  Administered 2015-07-31: 5 mg via ORAL
  Filled 2015-07-31: qty 1

## 2015-07-31 MED ORDER — OXYCODONE-ACETAMINOPHEN 5-325 MG PO TABS
1.0000 | ORAL_TABLET | ORAL | Status: DC | PRN
  Administered 2015-07-31: 2 via ORAL
  Administered 2015-07-31: 1 via ORAL
  Administered 2015-07-31 – 2015-08-01 (×5): 2 via ORAL
  Filled 2015-07-31 (×7): qty 2

## 2015-07-31 NOTE — Care Management Note (Signed)
Case Management Note  Patient Details  Name: Charles [name] MRN: [id number] Date of Birth: 11-03-1970  Subjective/Objective:    45 yr old male s/p left total hip arthroplasty.                Action/Plan: Case manager spoke with patient concerning [place] and DME needs. Choice was offered for Home Health agency. Referral was called to Pietro Cassis, [place] Liaison.   Expected Discharge Date:    08/01/15              Expected Discharge Plan:  [place]  In-House Referral:     Discharge planning Services  CM Consult  Post Acute Care Choice:  Durable Medical Equipment, Home Health Choice offered to:  Patient  DME Arranged:  3-N-1, Walker rolling DME Agency:  [place]:    [place]:  [place]  Status of Service:  Completed, signed off  If discussed at [place] of Stay Meetings, dates discussed:    Additional Comments:  Ninfa Meeker, RN 07/31/2015, 2:41 PM

## 2015-07-31 NOTE — Op Note (Signed)
NAMENAPOLEON, MONACELLI NO.:  [id number]  MEDICAL RECORD NO.:  [id number]  LOCATION:  [id number]                        FACILITY:  [facility]  PHYSICIAN:  Vonna Kotyk. Chidubem Chaires, M.D.DATE OF BIRTH:  1970/08/28  DATE OF PROCEDURE:  07/30/2015 DATE OF DISCHARGE:                              OPERATIVE REPORT   PREOPERATIVE DIAGNOSIS:  Symptomatic avascular necrosis, left hip.  POSTOPERATIVE DIAGNOSIS:  Symptomatic avascular necrosis, left hip.  PROCEDURE:  Left total hip replacement.  SURGEON:  Vonna Kotyk. Durward Fortes, MD.  ASSISTANTAaron Edelman D. Petrarca, PA-C.  ANESTHESIA:  Spinal with IV sedation.  COMPLICATIONS:  None.  DESCRIPTION OF PROCEDURE:  Mr. Jasinski was met with his family in the holding area.  I identified the left hip as the appropriate operative site and marked it accordingly, any questions were answered.  The patient was then transported to room #7, placed under spinal anesthetic per anesthesia without difficulty.  With IV sedation, the patient was then placed in the lateral decubitus position with the left side up and secured to the operating room table with the Innomed hip system.  The left hip was then prepped from iliac crest to just below the ankle with chlorhexidine scrub and DuraPrep x2.  Sterile draping was performed.  Time-out was called.  A routine Southern incision was utilized and via sharp dissection, carried down to subcutaneous tissue.  Adipose tissue was incised.  The level of the gluteus fascia and IT band, this was incised with the Bovie and then manually separated.  Self-retaining retractors were placed more deeply.  Short external rotators were identified.  Tendinous structures were tagged with 0 Ethibond suture, and then retracted.  Capsule was identified and incised along the femoral neck and head.  The joint was entered.  There was a small clear yellow joint effusion.  The femoral head was then easily dislocated  posteriorly.  Using a calcar guide, the femoral head was osteotomized with the oscillating saw.  There was obvious avascular necrosis with a ping-pong effect of the head and some mild surrounding synovitis.  Retractors were then placed around the proximal femur.  I cleared the piriformis fossa.  The starter hole was then made and reaming was performed to 13 mm to accept a 13.5 mm component.  I then sequentially rasped to a 13.5 large stature femoral component, and used the calcar reamer to obtain the appropriate calcar angle.  Level of the osteotomy was approximately a fingerbreadth proximal to the lesser trochanter.  Retractors were then placed about the acetabulum.  The ligamentum was sharply excised with a Bovie, labrum was also sharply excised with a long-handle 15 blade knife.  Retractors were carefully placed about the acetabulum.  Reaming was performed sequentially to 53 mm to accept a 54 component.  I trialed a 52 mm and then a 54 trial acetabular component, had complete rim fit and seating of the 52, had nice rim fit on the 54, but would not completely seat.  The final 54 mm outer diameter Gription 3 acetabular component was then impacted using external guide. I thought I had very nice position was perfectly stable and was completely seated.  The trial polyethylene component  was then impacted.  The 13.5 mm large stature femoral rasp was then impacted in place.  We trailed several neck sizes and felt that the +5 with a 36 mm outer diameter hip ball.  I reestablished leg lengths and it was perfectly stable in all planes.  The trial components were removed.  We irrigated the joint, apex hole eliminator was inserted followed by the final Marathon polyethylene liner +4 with a 10-degree posterior lip, placed topical tranexamic acid into the joint.  Retractors were then placed around the proximal femur.  We carefully impacted the 13.5 mm large stature femoral component  nicely on the calcar.  We cleaned the [facility] taper neck and then applied the ceramic 36 mm outer diameter hip ball with a +5 neck length.  We cleaned the acetabulum, any loose material, irrigated the wound, and then reduced the hip.  Through a full range of motion, we had perfect stability, there was no toggling. I felt leg lengths were perfectly symmetrical.  I did check the sciatic nerve throughout the procedure.  We thought it was well out of harm's way.  We injected the capsule with 0.25% Marcaine with epinephrine.  We applied topical tranexamic acid into the wound. The capsule was closed anatomically with #1 Ethibond.  Short external rotators were closed with the similar material.  The IT band and gluteus fascia were closed with running 0 Vicryl, subcu with several layers of Vicryl, and the subcu with 3-0 Monocryl.  Skin closed with skin clips. Sterile bulky dressing was applied.  The patient was then placed supine, a knee immobilizer placed to the left lower extremity.  The patient then transferred to the operating stretcher and then to the postanesthesia recovery room without problems.     Vonna Kotyk. Durward Fortes, M.D.     PWW/MEDQ  D:  07/30/2015  T:  07/31/2015  Job:  [id number]

## 2015-07-31 NOTE — Progress Notes (Signed)
Physical Therapy Treatment Patient Details Name: Charles [name] MRN: [id number] DOB: 07-11-1970 Today's Date: 07/31/2015    History of Present Illness Pt is a 45 y/o M s/p Lt THA.  Pt's PMH includes COPD, psoriasis, Bil femoral head AVN.     PT Comments    Patient tolerated gait training well with no c/o increased pain. Review HEP and practice step next session. Current plan remains appropriate.   Follow Up Recommendations  Home health PT;Supervision for mobility/OOB     Equipment Recommendations  Rolling walker with 5" wheels;3in1 (PT)    Recommendations for Other Services OT consult     Precautions / Restrictions Precautions Precautions: Fall;Posterior Hip Precaution Booklet Issued: Yes (comment) Precaution Comments: pt recalled 3/3 precautions beginning of session Required Braces or Orthoses: Knee Immobilizer - Left Knee Immobilizer - Left: On at all times Restrictions Weight Bearing Restrictions: Yes LLE Weight Bearing: Weight bearing as tolerated    Mobility  Bed Mobility               General bed mobility comments: OOB in chair upon arrival  Transfers Overall transfer level: Needs assistance Equipment used: Rolling walker (2 wheeled) Transfers: Sit to/from Stand Sit to Stand: Min guard         General transfer comment: from recliner; min guard for safety but no physical assist needed; cues to maintain precautions; safe hand placement  Ambulation/Gait Ambulation/Gait assistance: Supervision Ambulation Distance (Feet): 325 Feet Assistive device: Rolling walker (2 wheeled) Gait Pattern/deviations: Step-through pattern;Decreased stance time - left;Decreased stride length;Decreased weight shift to left Gait velocity: decreased   General Gait Details: cues for L heel strike and encouraged to increase WB on L LE; pt with improved step symmetry and WB with increased distance; heavy reliance on RW to offload L LE   Stairs            Wheelchair  Mobility    Modified Rankin (Stroke Patients Only)       Balance     Sitting balance-[name]       Standing balance-Leahy Scale: Fair                      Cognition Arousal/Alertness: Awake/alert Behavior During Therapy: WFL for tasks assessed/performed Overall Cognitive Status: Within Functional Limits for tasks assessed                      Exercises      General Comments        Pertinent Vitals/Pain Pain Assessment: 0-10 Pain Score: 7  Pain Location: L LE Pain Descriptors / Indicators: Guarding;Sore;Aching Pain Intervention(s): Limited activity within patient's tolerance;Monitored during session;Premedicated before session;Repositioned    Home Living                      Prior Function            PT Goals (current goals can now be found in the care plan section) Acute Rehab PT Goals Patient Stated Goal: less pain PT Goal Formulation: With patient/family Time For Goal Achievement: 08/06/15 Potential to Achieve Goals: Good Progress towards PT goals: Progressing toward goals    Frequency  7X/week    PT Plan Current plan remains appropriate    Co-evaluation             End of Session Equipment Utilized During Treatment: Gait belt;Left knee immobilizer Activity Tolerance: Patient tolerated treatment well Patient left: with call bell/phone within  reach;in chair     Time: [id number] PT Time Calculation (min) (ACUTE ONLY): 25 min  Charges:  $Gait Training: 8-22 mins $Therapeutic Activity: 8-22 mins                    G Codes:      Salina April, PTA Pager: [phone]   07/31/2015, 11:21 AM

## 2015-07-31 NOTE — Evaluation (Signed)
Occupational Therapy Evaluation Patient Details Name: Charles [name] MRN: [id number] DOB: [birth year] Today's Date: 07/31/2015    History of Present Illness Pt is a 45 y/o M s/p Lt THA.  Pt's PMH includes COPD, psoriasis, Bil femoral head AVN.    Clinical Impression   Pt was admitted for the above surgery.  He was limited by pain during initial evaluation. He plans to have family assist for adls. Will follow in acute setting for bathroom transfers.    Follow Up Recommendations  Supervision/Assistance - 24 hour    Equipment Recommendations  3 in 1 bedside comode    Recommendations for Other Services       Precautions / Restrictions Precautions Precautions: Fall;Posterior Hip Precaution Booklet Issued: Yes (comment) Precaution Comments: pt recalled 3/3 precautions beginning of session Required Braces or Orthoses: Knee Immobilizer - Left Knee Immobilizer - Left: On at all times Restrictions LLE Weight Bearing: Weight bearing as tolerated      Mobility Bed Mobility               General bed mobility comments: OOB in chair  Transfers   Equipment used: Rolling walker (2 wheeled) Transfers: Sit to/from Stand Sit to Stand: Min guard         General transfer comment: for safety    Balance                                            ADL Overall ADL's : Needs assistance/impaired                                       General ADL Comments: educated pt on THPS (posterior) in relationship to ADLs.  Educated on AE, but he plans to have his wife and children assist him as needed.  Pt needed to stand to urinate:  min guard given for safety--did help extend LLE when he sat.       Vision     Perception     Praxis      Pertinent Vitals/Pain Pain Score: 10-Worst pain ever Pain Location: LLE Pain Descriptors / Indicators: Aching;Guarding;Grimacing Pain Intervention(s): Limited activity within patient's tolerance;Monitored during  session;Premedicated before session;Repositioned (pt declined ice)     Hand Dominance     Extremity/Trunk Assessment Upper Extremity Assessment Upper Extremity Assessment: Overall WFL for tasks assessed           Communication Communication Communication: No difficulties   Cognition Arousal/Alertness: Awake/alert Behavior During Therapy: WFL for tasks assessed/performed Overall Cognitive Status: Within Functional Limits for tasks assessed                     General Comments       Exercises       Shoulder Instructions      Home Living Family/patient expects to be discharged to:: Private residence Living Arrangements: Spouse/significant other;Children                 Bathroom Shower/Tub: Tub/shower unit;Curtain   Bathroom Toilet: Standard     Home Equipment: Cane - single point          Prior Functioning/Environment Level of Independence: Independent with assistive device(s)        Comments: wife and children can assist as needed    OT  Diagnosis: Acute pain   OT Problem List: Pain;Decreased knowledge of precautions;Decreased activity tolerance   OT Treatment/Interventions: Self-care/ADL training;DME and/or AE instruction;Patient/family education    OT Goals(Current goals can be found in the care plan section) Acute Rehab OT Goals Patient Stated Goal: less pain OT Goal Formulation: With patient Time For Goal Achievement: 08/07/15 Potential to Achieve Goals: Good ADL Goals Pt Will Transfer to Toilet: with supervision;ambulating;bedside commode Pt Will Perform Tub/Shower Transfer: Tub transfer;with min guard assist;ambulating;grab bars (vs demonstrate)  OT Frequency: Min 2X/week   Barriers to D/C:            Co-evaluation              End of Session    Activity Tolerance: Patient limited by pain Patient left: in chair;with call bell/phone within reach   Time: 1437-1453 OT Time Calculation (min): 16 min Charges:  OT  General Charges $OT Visit: 1 Procedure OT Evaluation $OT Eval Low Complexity: 1 Procedure G-Codes:    Skeeter Sheard 21-Aug-2015, 3:44 PM Lesle Chris, OTR/L [phone] Aug 21, 2015

## 2015-07-31 NOTE — Progress Notes (Signed)
Patient ID: Charles [name], male   DOB: Apr 19, 1970, 45 y.o.   MRN: [id number] PATIENT ID: Charles [name]        MRN:  [id number]          DOB/AGE: 1970-05-03 / 45 y.o.    Joni Fears, MD   Biagio Borg, PA-C [address] [place], [place]  [address]                             [phone]   PROGRESS NOTE  Subjective:  negative for Chest Pain  negative for Shortness of Breath  negative for Nausea/Vomiting   negative for Calf Pain    Tolerating Diet: yes         Patient reports pain as 10 on 0-10 scale.     Difficult night with pain-receiving dilaudid but was on chronic percocet pre op  Objective: Vital signs in last 24 hours:   Patient Vitals for the past 24 hrs:  BP Temp Temp src Pulse Resp SpO2 Height Weight  07/31/15 0400 110/60 98.7 F (37.1 C) Oral 70 16 98 % - -  07/30/15 2300 (!) 114/59 98.3 F (36.8 C) Oral 71 16 98 % - -  07/30/15 2117 127/69 97.7 F (36.5 C) Oral 66 16 100 % - -  07/30/15 1538 119/70 98 F (36.7 C) Oral 65 16 100 % - -  07/30/15 1515 - 98 F (36.7 C) - - - - - -  07/30/15 1500 113/69 - - (!) 58 10 100 % - -  07/30/15 1445 115/68 - - 67 16 100 % - -  07/30/15 1430 102/76 - - 67 11 99 % - -  07/30/15 1415 113/77 - - 73 18 99 % - -  07/30/15 1400 (!) 99/54 - - 65 17 99 % - -  07/30/15 1345 104/74 - - 66 13 99 % - -  07/30/15 1330 108/72 - - 70 13 100 % - -  07/30/15 1315 110/80 - - 70 13 100 % - -  07/30/15 1245 127/78 97.6 F (36.4 C) - 98 13 99 % - -  07/30/15 0829 139/88 98.6 F (37 C) Oral 73 18 96 % 6' (1.829 m) 93.4 kg (206 lb)      Intake/Output from previous day:   07/25 0701 - 07/26 0700 In: 1840 [P.O.:240; I.V.:1600] Out: 500    Intake/Output this shift:   No intake/output data recorded.   Intake/Output      07/25 0701 - 07/26 0700 07/26 0701 - 07/27 0700   P.O. 240    I.V. (mL/kg) 1600 (17.1)    Total Intake(mL/kg) 1840 (19.7)    Blood 500    Total Output 500     Net +1340             LABORATORY DATA:  Recent  Labs  07/31/15 0520  WBC 6.6  HGB 11.4*  HCT 35.2*  PLT 141*    Recent Labs  07/31/15 0520  NA 137  K 3.7  CL 103  CO2 25  BUN 10  CREATININE 0.89  GLUCOSE 97  CALCIUM 8.4*   No results found for: INR, PROTIME  Recent Radiographic Studies :  Dg Hip Port Unilat With Pelvis 1v Left  Result Date: 07/30/2015 CLINICAL DATA:  Status post left total hip arthroplasty. EXAM: DG HIP (WITH OR WITHOUT PELVIS) 1V PORT LEFT COMPARISON:  Radiograph of June 25, 2015. FINDINGS: The femoral  and acetabular components appear to be well situated. No fracture or dislocation is noted. Postsurgical changes are seen in the surrounding soft tissues. IMPRESSION: Status post left total hip arthroplasty. Electronically Signed   By: Marijo Conception, M.D.   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

## 2015-07-31 NOTE — Progress Notes (Signed)
Physical Therapy Treatment Patient Details Name: Charles [name] MRN: [id number] DOB: [birth year] Today's Date: 07/31/2015    History of Present Illness Pt is a 45 y/o M s/p Lt THA.  Pt's PMH includes COPD, psoriasis, Bil femoral head AVN.     PT Comments    Patient continues to progress toward mobility goals. Overall supervision for safety. Tolerated gait/stair training and therex this session. Current plan remains appropriate.   Follow Up Recommendations  Home health PT;Supervision for mobility/OOB     Equipment Recommendations  Rolling walker with 5" wheels;3in1 (PT)    Recommendations for Other Services OT consult     Precautions / Restrictions Precautions Precautions: Fall;Posterior Hip Precaution Booklet Issued: Yes (comment) Precaution Comments: pt recalled 3/3 precautions beginning of session Required Braces or Orthoses: Knee Immobilizer - Left Knee Immobilizer - Left: On at all times Restrictions Weight Bearing Restrictions: Yes LLE Weight Bearing: Weight bearing as tolerated    Mobility  Bed Mobility               General bed mobility comments: OOB in chair upon arrival  Transfers Overall transfer level: Needs assistance Equipment used: Rolling walker (2 wheeled) Transfers: Sit to/from Stand Sit to Stand: Supervision         General transfer comment: supervision for safety; safe hand placement and technique demonstrated  Ambulation/Gait Ambulation/Gait assistance: Supervision Ambulation Distance (Feet): 200 Feet Assistive device: Rolling walker (2 wheeled) Gait Pattern/deviations: Step-through pattern;Decreased stride length;Decreased weight shift to left Gait velocity: decreased   General Gait Details: improved WB on L LE and safe use of AD   Stairs Stairs: Yes Stairs assistance: Supervision Stair Management: No rails;With walker Number of Stairs: 1 General stair comments: educated on sequencing and technique; good safety awareness;  supervision for safety  Wheelchair Mobility    Modified Rankin (Stroke Patients Only)       Balance     Sitting balance-[name]       Standing balance-Leahy Scale: Fair                      Cognition Arousal/Alertness: Awake/alert Behavior During Therapy: WFL for tasks assessed/performed Overall Cognitive Status: Within Functional Limits for tasks assessed                      Exercises Total Joint Exercises Quad Sets: AROM;Both;10 reps;Seated Gluteal Sets: AROM;Left;10 reps;Seated Heel Slides: AROM;Left;10 reps;Seated Long Arc Quad: AROM;Left;10 reps;Seated    General Comments        Pertinent Vitals/Pain Pain Assessment: Faces Pain Score: 10-Worst pain ever Faces Pain Scale: Hurts little more Pain Location: L LE  Pain Descriptors / Indicators: Aching;Grimacing;Guarding;Sore Pain Intervention(s): Limited activity within patient's tolerance;Monitored during session;Premedicated before session;Repositioned    Home Living Family/patient expects to be discharged to:: Private residence Living Arrangements: Spouse/significant other;Children           Home Equipment: Kasandra Knudsen - single point      Prior Function Level of Independence: Independent with assistive device(s)      Comments: wife and children can assist as needed   PT Goals (current goals can now be found in the care plan section) Acute Rehab PT Goals Patient Stated Goal: less pain PT Goal Formulation: With patient/family Time For Goal Achievement: 08/06/15 Potential to Achieve Goals: Good Progress towards PT goals: Progressing toward goals    Frequency  7X/week    PT Plan Current plan remains appropriate    Co-evaluation  End of Session Equipment Utilized During Treatment: Gait belt;Left knee immobilizer Activity Tolerance: Patient tolerated treatment well Patient left: with call bell/phone within reach;in chair     Time: [id number] PT Time  Calculation (min) (ACUTE ONLY): 32 min  Charges:  $Gait Training: 8-22 mins $Therapeutic Exercise: 8-22 mins                    G Codes:      Salina April, PTA Pager: [phone]   07/31/2015, 4:45 PM

## 2015-07-31 NOTE — Progress Notes (Signed)
Pt complained of pain. Current pain regimen is not working for patient. Been rotating IV and PO dilaudid along with robaxin and scheduled toradol with little relief per patient. Questioned patient about what has worked for him in the past. Patient is wanted to try percocet again and that was what he was taking prior to surgery. On call MD notified and orders given to discontinue po dilaudid and percocet 10/325 ordered.

## 2015-08-01 ENCOUNTER — Encounter (HOSPITAL_COMMUNITY): Payer: Self-pay | Admitting: General Practice

## 2015-08-01 LAB — BASIC METABOLIC PANEL
Anion gap: 5 (ref 5–15)
BUN: 6 mg/dL (ref 6–20)
CO2: 27 mmol/L (ref 22–32)
CREATININE: 0.86 mg/dL (ref 0.61–1.24)
Calcium: 8.7 mg/dL — ABNORMAL LOW (ref 8.9–10.3)
Chloride: 104 mmol/L (ref 101–111)
GFR calc Af Amer: >60 mL/min (ref >60)
GLUCOSE: 109 mg/dL — AB (ref 65–99)
POTASSIUM: 4 mmol/L (ref 3.5–5.1)
SODIUM: 136 mmol/L (ref 135–145)

## 2015-08-01 LAB — CBC
HCT: 34 % — ABNORMAL LOW (ref 39.0–52.0)
Hemoglobin: 11.3 g/dL — ABNORMAL LOW (ref 13.0–17.0)
MCH: 31.1 pg (ref 26.0–34.0)
MCHC: 33.2 g/dL (ref 30.0–36.0)
MCV: 93.7 fL (ref 78.0–100.0)
PLATELETS: 140 10*3/uL — AB (ref 150–400)
RBC: 3.63 MIL/uL — AB (ref 4.22–5.81)
RDW: 12.9 % (ref 11.5–15.5)
WBC: 6.4 10*3/uL (ref 4.0–10.5)

## 2015-08-01 MED ORDER — RIVAROXABAN 10 MG PO TABS: 10.0000 mg | ORAL_TABLET | Freq: Every day | ORAL | 0 refills | Status: DC

## 2015-08-01 MED ORDER — OXYCODONE-ACETAMINOPHEN 5-325 MG PO TABS: 1.0000 | ORAL_TABLET | ORAL | 0 refills | Status: DC | PRN

## 2015-08-01 MED ORDER — METHOCARBAMOL 500 MG PO TABS: 500.0000 mg | ORAL_TABLET | Freq: Four times a day (QID) | ORAL | 0 refills | Status: DC | PRN

## 2015-08-01 NOTE — Progress Notes (Signed)
Pt ready for discharge. Education/instructions reviewed with pt and wife, and all questions/concerns addressed. IV removed and belongings gathered. Pt will be transported out via wheelchair to wife's car. Will continue to monitor 

## 2015-08-01 NOTE — Progress Notes (Signed)
Patient ID: Charles [name], male   DOB: 16-Jul-1970, 45 y.o.   MRN: [id number] PATIENT ID: Charles [name]        MRN:  [id number]          DOB/AGE: Jun 25, 1970 / 45 y.o.    Joni Fears, MD   Biagio Borg, PA-C [address] [place],   [address]                             [phone]   PROGRESS NOTE  Subjective:  negative for Chest Pain  negative for Shortness of Breath  negative for Nausea/Vomiting   negative for Calf Pain    Tolerating Diet: yes         Patient reports pain as mild.    Much more comfortable today-anxious for discharge  Objective: Vital signs in last 24 hours:   Patient Vitals for the past 24 hrs:  BP Temp Temp src Pulse Resp SpO2  08/01/15 1300 134/76 98.2 F (36.8 C) Oral 78 16 99 %  08/01/15 0534 (!) 114/57 98.6 F (37 C) Oral 71 16 96 %  07/31/15 2221 127/61 99.6 F (37.6 C) Oral 84 16 100 %      Intake/Output from previous day:   07/26 0701 - 07/27 0700 In: 480 [P.O.:480] Out: 1950 [Urine:1950]   Intake/Output this shift:   07/27 0701 - 07/27 1900 In: 240 [P.O.:240] Out: -    Intake/Output      07/26 0701 - 07/27 0700 07/27 0701 - 07/28 0700   P.O. 480 240   I.V. (mL/kg)     Total Intake(mL/kg) 480 (5.1) 240 (2.6)   Urine (mL/kg/hr) 1950 (0.9)    Blood     Total Output 1950     Net -1470 +240           LABORATORY DATA:  Recent Labs  07/31/15 0520 08/01/15 0451  WBC 6.6 6.4  HGB 11.4* 11.3*  HCT 35.2* 34.0*  PLT 141* 140*    Recent Labs  07/31/15 0520 08/01/15 0451  NA 137 136  K 3.7 4.0  CL 103 104  CO2 25 27  BUN 10 6  CREATININE 0.89 0.86  GLUCOSE 97 109*  CALCIUM 8.4* 8.7*   No results found for: INR, PROTIME  Recent Radiographic Studies :  Dg Hip Port Unilat With Pelvis 1v Left  Result Date: 07/30/2015 CLINICAL DATA:  Status post left total hip arthroplasty. EXAM: DG HIP (WITH OR WITHOUT PELVIS) 1V PORT LEFT COMPARISON:  Radiograph of June 25, 2015. FINDINGS: The femoral and acetabular components  appear to be well situated. No fracture or dislocation is noted. Postsurgical changes are seen in the surrounding soft tissues. IMPRESSION: Status post left total hip arthroplasty. Electronically Signed   By: Marijo Conception, M.D.   On: 07/30/2015 15:36    Examination:  General appearance: alert, cooperative and no distress  Wound Exam: clean, dry, intact   Drainage:  None: wound tissue dry  Motor Exam: EHL, FHL, Anterior Tibial and Posterior Tibial Intact  Sensory Exam: Superficial Peroneal, Deep Peroneal and Tibial normal  Vascular Exam: Normal  Assessment:    2 Days Post-Op  Procedure(s) (LRB): TOTAL HIP ARTHROPLASTY (Left)  ADDITIONAL DIAGNOSIS:  Principal Problem:   Avascular necrosis of left femoral head (HCC) Active Problems:   Avascular necrosis of right femoral head (HCC)   Psoriasis   Status post total replacement of left hip  Plan: Physical Therapy as ordered Weight Bearing as Tolerated (WBAT)  DVT Prophylaxis:  Xarelto and TED hose  DISCHARGE PLAN: Home  DISCHARGE NEEDS: HHPT, Walker and 3-in-1 comode seat Comfortable, voiding without difficulty, pain under control, good effort in PT, dressing changed left hip-wound clean and dry. Will plan on discharge today.       Garald Balding  08/01/2015 2:17 PM

## 2015-08-01 NOTE — Discharge Summary (Signed)
Charles Fears, MD   Biagio Borg, PA-C [address], [place], [place]  [address]                             [phone]  PATIENT ID: Charles [name]        MRN:  [id number]          DOB/AGE: [birth year] / 45 y.o.    DISCHARGE SUMMARY  ADMISSION DATE:    07/30/2015 DISCHARGE DATE:   08/01/2015   ADMISSION DIAGNOSIS: LEFT HIP AVASCULAR NECROSIS    DISCHARGE DIAGNOSIS:  LEFT HIP AVASCULAR NECROSIS    ADDITIONAL DIAGNOSIS: Principal Problem:   Avascular necrosis of left femoral head (HCC) Active Problems:   Avascular necrosis of right femoral head (HCC)   Psoriasis   Status post total replacement of left hip  Past Medical History:  Diagnosis Date  . Arthritis   . Chronic back pain   . Chronic neck pain   . Chronic shoulder pain   . COPD (chronic obstructive pulmonary disease) ([place])   . GERD (gastroesophageal reflux disease)    tums  OTC  medication  . Headache   . Hypothyroidism     PROCEDURE: Procedure(s): TOTAL HIP ARTHROPLASTY Left on 07/30/2015  CONSULTS: none    HISTORY: [name] is a very pleasant 45 year old white male who is seen today for evaluation of his left hip. He has been previously seenwith bilateral groin pain, left greater than right. He is a patient of Dr. Estanislado Pandy and Julien Nordmann, PA-C who was diagnosed with sacroiliitis and psoriasis as a child. He was placed on Enbrel. 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 An MRI dated back to November of 2016 revealed stage I bilateral AVN. 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 Anterior/superior ligament tears of the right hip with a small paralabral cyst was also noted at that time. He did have corticosteroid injection in June of 2017 and that was only limited in benefit. 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 His symptoms have gotten to the point now where he is unable to be very comfortable and the pain is worsening in the left hip  HOSPITAL COURSE:  [name] is a 45 y.o. admitted on 07/30/2015 and found to have a diagnosis of Zap.  After appropriate laboratory studies were obtained  they were taken to the operating room on 07/30/2015 and underwent  Procedure(s): TOTAL HIP ARTHROPLASTY  Left.   They were given perioperative antibiotics:  Anti-infectives    Start     Dose/Rate Route Frequency Ordered Stop   07/30/15 1600  ceFAZolin (ANCEF) IVPB 1 g/50 mL premix     1 g 100 mL/hr over 30 Minutes Intravenous Every 6 hours 07/30/15 1543 07/30/15 2350    .  Tolerated the procedure well.  Toradol was given post op.  POD #1, allowed out of bed to a chair.  PT for ambulation and exercise program.  IV saline locked.  O2 discontionued.  POD #2, continued PT and ambulation.  Hemovac pulled. .  The remainder of the hospital course was dedicated to ambulation and strengthening.   The patient  was discharged on 2 Days Post-Op in  Stable condition.  Blood products given:none  DIAGNOSTIC STUDIES: Recent vital signs: Patient Vitals for the past 24 hrs:  BP Temp Temp src Pulse Resp SpO2  08/01/15 1300 134/76 98.2 F (36.8 C) Oral 78 16 99 %  08/01/15 0534 (!) 114/57 98.6 F (37 C) Oral 71 16 96 %  07/31/15 2221 127/61 99.6 F (37.6 C) Oral 84 16 100 %       Recent laboratory studies:  Recent Labs  07/31/15 0520 08/01/15 0451  WBC 6.6 6.4  HGB 11.4* 11.3*  HCT 35.2* 34.0*  PLT 141* 140*    Recent Labs  07/31/15 0520 08/01/15 0451  NA 137 136  K 3.7 4.0  CL 103 104  CO2 25 27  BUN 10 6  CREATININE 0.89 0.86  GLUCOSE 97 109*  CALCIUM 8.4* 8.7*   No results found for: INR, PROTIME   Recent Radiographic Studies :  Dg Hip Port Unilat With Pelvis 1v  Left  Result Date: 07/30/2015 CLINICAL DATA:  Status post left total hip arthroplasty. EXAM: DG HIP (WITH OR WITHOUT PELVIS) 1V PORT LEFT COMPARISON:  Radiograph of June 25, 2015. FINDINGS: The femoral and acetabular components appear to be well situated. No fracture or dislocation is noted. Postsurgical changes are seen in the surrounding soft tissues. IMPRESSION: Status post left total hip arthroplasty. Electronically Signed   By: Marijo Conception, M.D.   On: 07/30/2015 15:36   DISCHARGE INSTRUCTIONS: Discharge Instructions    Call MD / Call 911    Complete by:  As directed   If you experience chest pain or shortness of breath, CALL 911 and be transported to the hospital emergency room.  If you develope a fever above 101 F, pus (white drainage) or increased drainage or redness at the wound, or calf pain, call your surgeon's office.   Change dressing    Complete by:  As directed   DO NOT CHANGE THE DRESSING   Constipation Prevention    Complete by:  As directed   Drink plenty of fluids.  Prune juice may be helpful.  You may use a stool softener, such as Colace (over the counter) 100 mg twice a day.  Use MiraLax (over the counter) for constipation as needed.   Diet general    Complete by:  As directed   Discharge instructions    Complete by:  As directed   [place] items at home which could result in a fall. This includes throw rugs or furniture in walking pathways ICE to the affected joint every three hours while awake for 30 minutes at a time, for at least the first 3-5 days, and then as needed for pain and swelling.  Continue to use ice for pain and swelling. You may notice swelling that will progress down to the foot and ankle.  This is normal after surgery.  Elevate your leg when you are not up walking on it.   Continue to use the breathing machine you got in the hospital (incentive spirometer) which will help keep your temperature down.  It is common for  your temperature to cycle up and down following surgery, especially at night when you are not up moving around and exerting yourself.  The breathing machine keeps your lungs expanded and your temperature down.   DIET:  As you were doing prior to hospitalization, we recommend a well-balanced diet.  DRESSING / WOUND CARE / SHOWERING  Keep the surgical dressing until follow up.  The dressing is water proof, so you can shower without any extra covering.  IF THE DRESSING FALLS OFF or the wound gets wet inside, change the dressing with sterile gauze.  Please use good hand washing techniques before changing the dressing.  Do not use any lotions or creams on the incision until instructed by your surgeon.    ACTIVITY  Increase activity slowly as tolerated, but follow the weight bearing instructions below.   No driving for 6 weeks or until further direction given by your physician.  You cannot drive while taking narcotics.  No lifting or carrying greater than 10 lbs. until further directed by your surgeon. Avoid periods of inactivity such as sitting longer than an hour when not asleep. This helps prevent blood clots.  You may return to work once you are authorized by your doctor.     WEIGHT BEARING   Weight bearing as tolerated with assist device (walker, cane, etc) as directed, use it as long as suggested by your surgeon or therapist, typically at least 4-6 weeks.   EXERCISES  Results after joint replacement surgery are often greatly improved when you follow the exercise, range of motion and muscle strengthening exercises prescribed by your doctor. Safety measures are also important to protect the joint from further injury. Any time any of these exercises cause you to have increased pain or swelling, decrease what you are doing until you are comfortable again and then slowly increase them. If you have problems or questions, call your caregiver or physical therapist for advice.   Rehabilitation is  important following a joint replacement. After just a few days of immobilization, the muscles of the leg can become weakened and shrink (atrophy).  These exercises are designed to build up the tone and strength of the thigh and leg muscles and to improve motion. Often times heat used for twenty to thirty minutes before working out will loosen up your tissues and help with improving the range of motion but do not use heat for the first two weeks following surgery (sometimes heat can increase post-operative swelling).   These exercises can be done on a training (exercise) mat, on a table or on a bed. Use whatever works the best and is most comfortable for you.    Use music or television while you are exercising so that the exercises are a pleasant break in your day. This will make your life better with the exercises acting as a break in your routine that you can look forward to.   Perform all exercises about fifteen times, three times per day or as directed.  You should exercise both the operative leg and the other leg as well.   Exercises include:  Quad Sets - Tighten up the muscle on the front of the thigh (Quad) and hold for 5-10 seconds.   Straight Leg Raises - With your knee straight (if you were given a brace, keep it on), lift the leg to 60 degrees, hold for 3 seconds, and slowly lower the leg.  Perform this exercise against resistance later as your leg gets stronger.  Leg Slides: Lying on your back, slowly slide your foot toward your buttocks, bending your knee up off the floor (only go as far as is comfortable). Then slowly slide your foot back down until your leg is flat on the floor again.  Angel Wings: Lying on your back spread your legs to the side as far apart as you  can without causing discomfort.  Hamstring Strength:  Lying on your back, push your heel against the floor with your leg straight by tightening up the muscles of your buttocks.  Repeat, but this time bend your knee to a comfortable  angle, and push your heel against the floor.  You may put a pillow under the heel to make it more comfortable if necessary.   A rehabilitation program following joint replacement surgery can speed recovery and prevent re-injury in the future due to weakened muscles. Contact your doctor or a physical therapist for more information on knee rehabilitation.    CONSTIPATION  Constipation is defined medically as fewer than three stools per week and severe constipation as less than one stool per week.  Even if you have a regular bowel pattern at home, your normal regimen is likely to be disrupted due to multiple reasons following surgery.  Combination of anesthesia, postoperative narcotics, change in appetite and fluid intake all can affect your bowels.   YOU MUST use at least one of the following options; they are listed in order of increasing strength to get the job done.  They are all available over the counter, and you may need to use some, POSSIBLY even all of these options:    Drink plenty of fluids (prune juice may be helpful) and high fiber foods Colace 100 mg by mouth twice a day  Senokot for constipation as directed and as needed Dulcolax (bisacodyl), take with full glass of water  Miralax (polyethylene glycol) once or twice a day as needed.  If you have tried all these things and are unable to have a bowel movement in the first 3-4 days after surgery call either your surgeon or your primary doctor.    If you experience loose stools or diarrhea, hold the medications until you stool forms back up.  If your symptoms do not get better within 1 week or if they get worse, check with your doctor.  If you experience "the worst abdominal pain ever" or develop nausea or vomiting, please contact the office immediately for further recommendations for treatment.   ITCHING:  If you experience itching with your medications, try taking only a single pain pill, or even half a pain pill at a time.  You can  also use Benadryl over the counter for itching or also to help with sleep.   TED HOSE STOCKINGS:  Use stockings on both legs until for at least 2 weeks or as directed by physician office. They may be removed at night for sleeping.  MEDICATIONS:  See your medication summary on the "After Visit Summary" that nursing will review with you.  You may have some home medications which will be placed on hold until you complete the course of blood thinner medication.  It is important for you to complete the blood thinner medication as prescribed.  PRECAUTIONS:  If you experience chest pain or shortness of breath - call 911 immediately for transfer to the hospital emergency department.   If you develop a fever greater that 101 F, purulent drainage from wound, increased redness or drainage from wound, foul odor from the wound/dressing, or calf pain - CONTACT YOUR SURGEON.                                                   FOLLOW-UP APPOINTMENTS:  If you do not already have a post-op appointment, please call the office for an appointment to be seen by your surgeon.  Guidelines for how soon to be seen are listed in your "After Visit Summary", but are typically between 1-4 weeks after surgery.  OTHER INSTRUCTIONS:   Knee Replacement:  Do not place pillow under knee, focus on keeping the knee straight while resting. CPM instructions: 0-90 degrees, 2 hours in the morning, 2 hours in the afternoon, and 2 hours in the evening. Place foam block, curve side up under heel at all times except when in CPM or when walking.  DO NOT modify, tear, cut, or change the foam block in any way.  MAKE SURE YOU:  Understand these instructions.  Get help right away if you are not doing well or get worse.    Thank you for letting us be a part of your medical care team.  It is a privilege we respect greatly.  We hope these instructions will help you stay on track for a fast and full recovery!   Driving restrictions    Complete by:   As directed   No driving for 6 weeks   Follow the hip precautions as taught in Physical Therapy    Complete by:  As directed   Increase activity slowly as tolerated    Complete by:  As directed   Lifting restrictions    Complete by:  As directed   No lifting for 6 weeks   Patient may shower    Complete by:  As directed   You may shower over the brown dressing   TED hose    Complete by:  As directed   Use stockings (TED hose) for 3 weeks on left leg.  You may remove them at night for sleeping.   Weight bearing as tolerated    Complete by:  As directed   Laterality:  left   Extremity:  Lower      DISCHARGE MEDICATIONS:     Medication List    STOP taking these medications   ENBREL SURECLICK 50 MG/ML injection Generic drug:  etanercept   oxyCODONE-acetaminophen 10-325 MG tablet Commonly known as:  PERCOCET Replaced by:  oxyCODONE-acetaminophen 5-325 MG tablet     TAKE these medications   levothyroxine 75 MCG tablet Commonly known as:  SYNTHROID, LEVOTHROID Take 75 mcg by mouth daily before breakfast.   methocarbamol 500 MG tablet Commonly known as:  ROBAXIN Take 1 tablet (500 mg total) by mouth every 6 (six) hours as needed for muscle spasms.   oxyCODONE-acetaminophen 5-325 MG tablet Commonly known as:  PERCOCET/ROXICET Take 1-2 tablets by mouth every 4 (four) hours as needed for moderate pain or severe pain. Replaces:  oxyCODONE-acetaminophen 10-325 MG tablet   rivaroxaban 10 MG Tabs tablet Commonly known as:  XARELTO Take 1 tablet (10 mg total) by mouth daily with breakfast.       FOLLOW UP VISIT:   Follow-up Information    Britt .   Why:  Someone from [facility] will contact you to arrange start date and time for therapy. Contact information: [place] [address] [phone]           DISPOSITION:   Home  CONDITION:  Stable   Mike Craze. [place], [place] [phone]  08/01/2015 3:03 PM

## 2015-08-01 NOTE — Progress Notes (Signed)
Physical Therapy Treatment Patient Details Name: Charles [name] MRN: [id number] DOB: 1970/03/27 Today's Date: 08/01/2015    History of Present Illness Pt is a 45 y/o M s/p Lt THA.  Pt's PMH includes COPD, psoriasis, Bil femoral head AVN.     PT Comments    Pt performed increased gait and reviewed standing exercises and hip precautions.  Pt ready for d/c home, informed RN and will follow patient until d/c per POC.    Follow Up Recommendations  Home health PT;Supervision for mobility/OOB     Equipment Recommendations  Rolling walker with 5" wheels;3in1 (PT)    Recommendations for Other Services OT consult     Precautions / Restrictions Precautions Precautions: Fall;Posterior Hip Precaution Comments: Pt recalled 3/3 hip precautions with increased time.   Knee Immobilizer - Left: On at all times Restrictions Weight Bearing Restrictions: Yes LLE Weight Bearing: Weight bearing as tolerated    Mobility  Bed Mobility               General bed mobility comments: Pt received in recliner on arrival.    Transfers Overall transfer level: Needs assistance Equipment used: Rolling walker (2 wheeled) Transfers: Sit to/from Stand Sit to Stand: Supervision         General transfer comment: Cues for advancement of LLE forward.    Ambulation/Gait Ambulation/Gait assistance: Supervision Ambulation Distance (Feet): 300 Feet Assistive device: Rolling walker (2 wheeled) Gait Pattern/deviations: Step-through pattern;Decreased stride length;Decreased stance time - left Gait velocity: decreased   General Gait Details: Cues for forward gaze and gait symmetry.  Pt educated on RW use and weight shifting to promote gait symmetry.     Stairs            Wheelchair Mobility    Modified Rankin (Stroke Patients Only)       Balance Overall balance assessment: Needs assistance Sitting-balance support: No upper extremity supported Sitting balance-[name]        Standing balance-Leahy Scale: Fair                      Cognition Arousal/Alertness: Awake/alert Behavior During Therapy: WFL for tasks assessed/performed Overall Cognitive Status: Within Functional Limits for tasks assessed                      Exercises Total Joint Exercises Hip ABduction/ADduction: AROM;Left;10 reps;Standing Knee Flexion: AROM;Left;10 reps;Standing Marching in Standing: AROM;Left;10 reps;Standing Standing Hip Extension: AROM;Left;10 reps;Standing    General Comments        Pertinent Vitals/Pain Pain Assessment: 0-10 Pain Score: 5  Faces Pain Scale: Hurts little more Pain Location: L LE Pain Descriptors / Indicators: Guarding;Sore Pain Intervention(s): Ice applied;Repositioned;RN gave pain meds during session    Home Living Family/patient expects to be discharged to:: Private residence Living Arrangements: Spouse/significant other;Children                  Prior Function            PT Goals (current goals can now be found in the care plan section) Acute Rehab PT Goals Patient Stated Goal: go home Potential to Achieve Goals: Good Progress towards PT goals: Progressing toward goals    Frequency  7X/week    PT Plan Current plan remains appropriate    Co-evaluation             End of Session Equipment Utilized During Treatment: Gait belt Activity Tolerance: Patient tolerated treatment well Patient left: with call  bell/phone within reach;in chair     Time: [id number] PT Time Calculation (min) (ACUTE ONLY): 18 min  Charges:  $Gait Training: 8-22 mins                    G Codes:      Charles [name] 08-31-15, 1:40 PM  Charles [name], PTA pager [phone]

## 2015-08-01 NOTE — Progress Notes (Signed)
Occupational Therapy Treatment Patient Details Name: Charles [name] MRN: [id number] DOB: Apr 17, 1970 Today's Date: 08/01/2015    History of present illness Pt is a 45 y/o M s/p Lt THA.  Pt's PMH includes COPD, psoriasis, Bil femoral head AVN.    OT comments  Focus of session on reinforcing hip precautions during ADL and ADL transfers, tub transfer education, standing ADL, and toileting. Pt able to state 3/3 hip precautions at end of session. Pt performed sponge bathing and dressing with assist for feet. Pt is aware of AE, but will rely on his family to assist.   Follow Up Recommendations  No OT follow up    Equipment Recommendations  3 in 1 bedside comode (in room)    Recommendations for Other Services      Precautions / Restrictions Precautions Precautions: Fall;Posterior Hip Precaution Comments: pt recalled 2/3 precautions at beginning of session, 3/3 at end Required Braces or Orthoses: Knee Immobilizer - Left Knee Immobilizer - Left: On at all times Restrictions LLE Weight Bearing: Weight bearing as tolerated       Mobility Bed Mobility Overal bed mobility: Modified Independent             General bed mobility comments: increased time, no physical assist, assists L LE with hands  Transfers   Equipment used: Rolling walker (2 wheeled) Transfers: Sit to/from Stand Sit to Stand: Modified independent (Device/Increase time)         General transfer comment: safe hand placement, cued for L LE positioning with sit<>stand    Balance Overall balance assessment: Needs assistance   Sitting balance-[name]       Standing balance-Leahy Scale: Fair Standing balance comment: able to release walker in standing for ADL                   ADL Overall ADL's : Needs assistance/impaired     Grooming: Wash/dry hands;Wash/dry face;Oral care;Standing;Modified independent   Upper Body Bathing: Set up;Sitting   Lower Body Bathing: Set up;Sit to/from  stand Lower Body Bathing Details (indicate cue type and reason): pt is aware of long handled bath sponge Upper Body Dressing : Set up;Sitting       Toilet Transfer: Modified Independent;RW;Ambulation;BSC (over toilet)   Toileting- Clothing Manipulation and Hygiene: Modified independent;Sit to/from Nurse, children's Details (indicate cue type and reason): pt has grab bars, instructed in technique and use of 3 in 1 vs plastic chair, pt verbalizing understanding Functional mobility during ADLs: Supervision/safety;Rolling walker        Vision                     Perception     Praxis      Cognition   Behavior During Therapy: WFL for tasks assessed/performed Overall Cognitive Status: Within Functional Limits for tasks assessed                       Extremity/Trunk Assessment               Exercises     Shoulder Instructions       General Comments      Pertinent Vitals/ Pain       Pain Assessment: 0-10 Pain Score: 5  Pain Location: L LE Pain Descriptors / Indicators: Guarding;Sore Pain Intervention(s): Ice applied;Repositioned;RN gave pain meds during session  Home Living  Prior Functioning/Environment              Frequency Min 2X/week     Progress Toward Goals  OT Goals(current goals can now be found in the care plan section)  Progress towards OT goals: Progressing toward goals  Acute Rehab OT Goals Patient Stated Goal: go home Time For Goal Achievement: 08/07/15 Potential to Achieve Goals: Good  Plan Discharge plan remains appropriate    Co-evaluation                 End of Session Equipment Utilized During Treatment: Gait belt;Rolling walker;Left knee immobilizer   Activity Tolerance Patient tolerated treatment well   Patient Left in chair;with call bell/phone within reach   Nurse Communication Patient requests pain meds        Time:  [id number] OT Time Calculation (min): 40 min  Charges: OT General Charges $OT Visit: 1 Procedure OT Treatments $Self Care/Home Management : 38-52 mins  Malka So 08/01/2015, 9:31 AM  [phone]

## 2015-08-01 NOTE — Discharge Instructions (Signed)

## 2015-09-26 ENCOUNTER — Encounter: Payer: Self-pay | Admitting: Neurology

## 2015-09-26 ENCOUNTER — Ambulatory Visit (INDEPENDENT_AMBULATORY_CARE_PROVIDER_SITE_OTHER): Payer: BLUE CROSS/BLUE SHIELD | Admitting: Neurology

## 2015-09-26 VITALS — BP 130/80 | HR 70 | Resp 16 | Ht 72.0 in | Wt 216.0 lb

## 2015-09-26 DIAGNOSIS — G5712 Meralgia paresthetica, left lower limb: Secondary | ICD-10-CM | POA: Diagnosis not present

## 2015-09-26 DIAGNOSIS — L409 Psoriasis, unspecified: Secondary | ICD-10-CM | POA: Diagnosis not present

## 2015-09-26 DIAGNOSIS — R200 Anesthesia of skin: Secondary | ICD-10-CM

## 2015-09-26 DIAGNOSIS — M87052 Idiopathic aseptic necrosis of left femur: Secondary | ICD-10-CM | POA: Diagnosis not present

## 2015-09-26 MED ORDER — LIDOCAINE 5 % EX PTCH: 2.0000 | MEDICATED_PATCH | CUTANEOUS | 5 refills | Status: DC

## 2015-09-26 MED ORDER — LAMOTRIGINE 100 MG PO TABS
ORAL_TABLET | ORAL | 11 refills | Status: DC
Start: 2015-09-26 — End: 2015-10-07

## 2015-09-26 NOTE — Progress Notes (Signed)
[facility]  PATIENT: Charles [name] DOB: 01-Sep-1970  REFERRING DOCTOR OR PCP:  Dr. Estanislado Pandy   Fax [phone] SOURCE: patient, records from Dr. Estanislado Pandy.    NCV/EMG study, imaging reports  _________________________________   HISTORICAL  CHIEF COMPLAINT:  Chief Complaint  Patient presents with  . Numbness    Sts. onset 2 yrs. ago of a small, baseball sized area of numbness, burning pain left lateral thigh.  Pain, numbness slowly progressed so that now it encompasses his entire left lateral thigh.  He also has a small area of burning pain, numbness right lateral thigh.  In Jan. 2017, he was dx. with Avascular Necrosis bilat hipsand had and had a left hip replacement in July 2017.  (Dr. Durward Fortes, [facility]).  He sees Dr. Estanislado Pandy for Psoriatic Arthiritis and takes Enbrel for same.  Sts. EMG/NCV at   . Leg Pain    [facility] showed no nerve damage.  Sts. no relief with Gabapentin./fim  . Hx. of Psoriatic Arthritis  . Hx. of Avascular Necrosis Bilat Hips, with Left Hip Replacem    HISTORY OF PRESENT ILLNESS:  I had the pleasure seeing you patient, Charles [name], at [facility] for neurologic consultation regarding his left leg numbness and pain. Additionally, he has psoriatic arthritis and history of avascular necrosis in both hips and has had a placement in July 2017.  About 2 years ago, without any preceding event, he had the onset of numbness that increased over a few weeks in the anterolateral left thigh. Over the next few months, the numbness began to resolve but was replaced with a burning pain. Additionally, he has allodynia and when the skin is touched, rather than filling touch, he feels burning pain. In general the symptoms today are about the same as a year ago. He has been tried on locations including gabapentin and Keppra. I do not know the dose of these medications but he did not receive any significant benefit from either of  them.     He had a NCV/EMG by Dr. Ernestina Patches performed to 03/25/2015. The nerve conduction studies and EMG were normal. The nerves tested did not include the lateral femoral cutaneous nerve.  A couple months ago, he had a left hip replacement due to the avascular necrosis.   In the past, he would get steroid shots now and then but he was not on oral steroids. His psoriatic arthritis is being treated with Enbrel. He sees Dr. Estanislado Pandy.     REVIEW OF SYSTEMS: Constitutional: No fevers, chills, sweats, or change in appetite Eyes: No visual changes, double vision, eye pain Ear, nose and throat: No hearing loss, ear pain, nasal congestion, sore throat Cardiovascular: No chest pain, palpitations Respiratory: No shortness of breath at rest or with exertion.   No wheezes GastrointestinaI: No nausea, vomiting, diarrhea, abdominal pain, fecal incontinence Genitourinary: No dysuria, urinary retention or frequency.  No nocturia. Musculoskeletal: No neck pain, back pain Integumentary: No rash, pruritus, skin lesions Neurological: as above Psychiatric: No depression at this time.  No anxiety Endocrine: No palpitations, diaphoresis, change in appetite, change in weigh or increased thirst Hematologic/Lymphatic: No anemia, purpura, petechiae. Allergic/Immunologic: No itchy/runny eyes, nasal congestion, recent allergic reactions, rashes  ALLERGIES: Allergies  Allergen Reactions  . No Known Allergies     HOME MEDICATIONS:  Current Outpatient Prescriptions:  .  levothyroxine (SYNTHROID, LEVOTHROID) 75 MCG tablet, Take 75 mcg by mouth daily before breakfast. , Disp: , Rfl: 4 .  methocarbamol (ROBAXIN) 500  MG tablet, Take 1 tablet (500 mg total) by mouth every 6 (six) hours as needed for muscle spasms., Disp: 40 tablet, Rfl: 0 .  oxyCODONE-acetaminophen (PERCOCET/ROXICET) 5-325 MG tablet, Take 1-2 tablets by mouth every 4 (four) hours as needed for moderate pain or severe pain., Disp: 85 tablet, Rfl:  0 .  ENBREL SURECLICK 50 MG/ML injection, , Disp: , Rfl: 2 .  lamoTRIgine (LAMICTAL) 100 MG tablet, Take 1/2 pill x 5 days, then take 1/2 pill bid x 5 days, then take 1/2 pill tid x 5 days then take 1 pill po bid, Disp: 60 tablet, Rfl: 11 .  lidocaine (LIDODERM) 5 %, Place 2 patches onto the skin daily. Remove & Discard patch after 12 hours or so a day, Disp: 60 patch, Rfl: 5 .  rivaroxaban (XARELTO) 10 MG TABS tablet, Take 1 tablet (10 mg total) by mouth daily with breakfast. (Patient not taking: Reported on 09/26/2015), Disp: 30 tablet, Rfl: 0  PAST MEDICAL HISTORY: Past Medical History:  Diagnosis Date  . Arthritis   . Chronic back pain   . Chronic neck pain   . Chronic shoulder pain   . COPD (chronic obstructive pulmonary disease) ([place])   . GERD (gastroesophageal reflux disease)    tums  OTC  medication  . Headache   . Hypothyroidism     PAST SURGICAL HISTORY: Past Surgical History:  Procedure Laterality Date  . NECK SURGERY    . SHOULDER SURGERY    . TOTAL HIP ARTHROPLASTY Left 07/30/2015   Procedure: TOTAL HIP ARTHROPLASTY;  Surgeon: Garald Balding, MD;  Location: [place];  Service: Orthopedics;  Laterality: Left;    FAMILY HISTORY: Family History  Problem Relation Age of Onset  . Heart failure Mother   . Diabetes Mother   . Hypertension Mother   . Cancer Father   . Heart failure Father     SOCIAL HISTORY:  Social History   Social History  . Marital status: Married    Spouse name: N/A  . Number of children: N/A  . Years of education: N/A   Occupational History  . Not on file.   Social History Main Topics  . Smoking status: Former Smoker    Packs/day: 0.50    Types: Cigarettes    Quit date: 05/17/2015  . Smokeless tobacco: Never Used     Comment: USES VAPOR CIGARETTES   . Alcohol use 3.6 oz/week    6 Cans of beer per week     Comment: twice a week  . Drug use: No     Comment: states quit  . Sexual activity: Not on file   Other Topics Concern  .  Not on file   Social History Narrative  . No narrative on file     PHYSICAL EXAM  Vitals:   09/26/15 1039  BP: 130/80  Pulse: 70  Resp: 16  Weight: 216 lb (98 kg)  Height: 6' (1.829 m)    Body mass index is 29.29 kg/m.   General: The patient is well-developed and well-nourished and in no acute distress  Musculoskeletal:  Back is nontender  Neurologic Exam  Mental status: The patient is alert and oriented x 3 at the time of the examination. The patient has apparent normal recent and remote memory, with an apparently normal attention span and concentration ability.   Speech is normal.  Cranial nerves: Extraocular movements are full. Pupils are equal, round, and reactive to light and accomodation.   There is good facial  sensation to soft touch bilaterally.Facial strength is normal.  Trapezius and sternocleidomastoid strength is normal. No dysarthria is noted.  The tongue is midline, and the patient has symmetric elevation of the soft palate. No obvious hearing deficits are noted.  Motor:  Muscle bulk is normal.   Tone is normal. Strength is  5 / 5 in all 4 extremities.   Sensory: Sensory testing is intact to pinprick, soft touch and vibration sensation in the arms. He has numbness with allodynia in the distribution of the left lateral femoral cutaneous nerve..  Coordination: Cerebellar testing reveals good finger-nose-finger and heel-to-shin bilaterally.  Gait and station: Station is normal.   Gait is arthritic. Tandem gait is slightly wide.   Romberg is negative.   Reflexes: Deep tendon reflexes are symmetric and normal bilaterally.   Plantar responses are flexor.    DIAGNOSTIC DATA (LABS, IMAGING, TESTING) - I reviewed patient records, labs, notes, testing and imaging myself where available.  Lab Results  Component Value Date   WBC 6.4 08/01/2015   HGB 11.3 (L) 08/01/2015   HCT 34.0 (L) 08/01/2015   MCV 93.7 08/01/2015   PLT 140 (L) 08/01/2015      Component  Value Date/Time   NA 136 08/01/2015 0451   K 4.0 08/01/2015 0451   CL 104 08/01/2015 0451   CO2 27 08/01/2015 0451   GLUCOSE 109 (H) 08/01/2015 0451   BUN 6 08/01/2015 0451   CREATININE 0.86 08/01/2015 0451   CALCIUM 8.7 (L) 08/01/2015 0451   PROT 6.9 04/27/2014 1333   ALBUMIN 4.3 04/27/2014 1333   AST 20 04/27/2014 1333   ALT 17 04/27/2014 1333   ALKPHOS 39 04/27/2014 1333   BILITOT 0.6 04/27/2014 1333   GFRNONAA >60 08/01/2015 0451   GFRAA >60 08/01/2015 0451    Lab Results  Component Value Date   TSH 3.306 04/27/2014       ASSESSMENT AND PLAN  Numbness  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 Altamimi is a 45 year old man who has numbness with allodynia in the distribution of the left lateral femoral cutaneous nerve. As he has had symptoms for over a year that have persisted, he is likely to have some dysesthesias long-term. I sent in prescriptions for Lidoderm patches to wear daily and lamotrigine 2 titrate up to 100 mg by mouth twice a day. I will titrate this dose up further based on his response. If he gets no benefit, consider a change to Trileptal or a tricyclic.  He will return to see me in 3 months or call sooner if there are new or worsening symptoms or if no better.  Thank you for asking me to see Mr. Avalo for neurologic consultation. Please let me know if I can be of further assistance with him or other patients in the future.   Angelie Kram A. Felecia Shelling, MD, PhD [id number], [id number] PM Certified in Neurology, Clinical Neurophysiology, Sleep Medicine, Pain Medicine and Neuroimaging  [facility] [address] [place], [place] [address] [phone]

## 2015-09-26 NOTE — Patient Instructions (Signed)
The pharmacy has the prescription for lamotrigine 100 mg tablets. For 5 days, just take one half pill a day. For the next 5 days, take one half pill twice a day. For the next 5 days, take one half pill 3 times a day Then start taking one pill twice a day from this point on.    In the future, we may increase the dose further.  If you get a rash, need to stop the medication and not take it again. 

## 2015-10-07 ENCOUNTER — Telehealth: Payer: Self-pay | Admitting: Neurology

## 2015-10-07 DIAGNOSIS — R200 Anesthesia of skin: Secondary | ICD-10-CM

## 2015-10-07 MED ORDER — OXCARBAZEPINE 150 MG PO TABS: 150.0000 mg | ORAL_TABLET | Freq: Three times a day (TID) | ORAL | 5 refills | Status: DC

## 2015-10-07 NOTE — Telephone Encounter (Signed)
Pt called in stating he started to get a rash on his right shoulder. He noticed it this morning. He has been taking lamoTRIgine (LAMICTAL) 100 MG tablet since 09/27/15. No other symptoms. He was told to stop taking the medication if a rash appeared. He has not taken any medication this morning. Please call and advise [phone]

## 2015-10-24 NOTE — Addendum Note (Signed)
Addended by: France Ravens I on: 10/24/2015 02:09 PM   Modules accepted: Orders

## 2015-10-24 NOTE — Telephone Encounter (Signed)
Patient called to advise, previous medication he was on broke him out in a rash, was switched to another medication OXcarbazepine (TRILEPTAL) 150 MG tablet, has headaches, stomach pain and nausea with this medication, please call to advise [phone].

## 2015-10-24 NOTE — Telephone Encounter (Signed)
I have spoken with Charles [name] again.  Per RAS, ok for a left lateral femoral cutaneous nerve rf procedure.   I spoke with Charles [name], who works with Dr. Maryjean Ka at [facility], to confirm that he can do this procedure.  Referral ordered in EPIC.  Charles [name] verbalized understanding of same, is agreeable with this plan/fim

## 2015-10-31 ENCOUNTER — Telehealth: Payer: Self-pay | Admitting: Rheumatology

## 2015-10-31 NOTE — Telephone Encounter (Signed)
Patient called and states [facility] needs authorization for refill of Enbrel.

## 2015-11-03 NOTE — Telephone Encounter (Signed)
Have completed a prior auth request via cover my meds

## 2015-11-05 ENCOUNTER — Other Ambulatory Visit (HOSPITAL_COMMUNITY): Payer: Self-pay | Admitting: Family Medicine

## 2015-11-05 ENCOUNTER — Ambulatory Visit (HOSPITAL_COMMUNITY)
Admission: RE | Admit: 2015-11-05 | Discharge: 2015-11-05 | Disposition: A | Discharge status: Home / Self Care | Payer: BLUE CROSS/BLUE SHIELD | Source: Ambulatory Visit | Attending: Family Medicine | Admitting: Family Medicine

## 2015-11-05 DIAGNOSIS — M542 Cervicalgia: Secondary | ICD-10-CM | POA: Diagnosis not present

## 2015-11-05 DIAGNOSIS — M436 Torticollis: Secondary | ICD-10-CM | POA: Diagnosis not present

## 2015-11-05 DIAGNOSIS — M47892 Other spondylosis, cervical region: Secondary | ICD-10-CM | POA: Diagnosis not present

## 2015-11-06 ENCOUNTER — Encounter: Payer: Self-pay | Admitting: Internal Medicine

## 2015-11-06 ENCOUNTER — Telehealth (INDEPENDENT_AMBULATORY_CARE_PROVIDER_SITE_OTHER): Payer: Self-pay | Admitting: Orthopaedic Surgery

## 2015-11-06 NOTE — Telephone Encounter (Signed)
Almyra Free w/[facility] called stating we sent patient's prior authorization for Enbrel to [facility] to be reviewed and they need more information ,cb# [phone]

## 2015-11-07 NOTE — Telephone Encounter (Signed)
Dr Estanislado Pandy pt

## 2015-11-07 NOTE — Telephone Encounter (Signed)
I have called spoke to someone and was told to call [phone]  , tried to leave message but the mailbox is full there, unable to reach, do not know what they need will have to try again later

## 2015-11-11 NOTE — Telephone Encounter (Signed)
I called and spoke to a representative with the clinical review team.  They are going to fax over the form with the additional information that is needed.

## 2015-11-12 ENCOUNTER — Telehealth: Payer: Self-pay | Admitting: Radiology

## 2015-11-12 NOTE — Telephone Encounter (Signed)
I have completed forms and faxed to [facility].

## 2015-11-12 NOTE — Telephone Encounter (Signed)
LMOM for patient to call back and schedule follow up. °

## 2015-11-12 NOTE — Telephone Encounter (Signed)
Patient needs follow up appt with Dr Deveshwar pls call to make appt. Dec/ Jan  

## 2015-11-13 ENCOUNTER — Encounter: Payer: Self-pay | Admitting: Nurse Practitioner

## 2015-11-13 ENCOUNTER — Ambulatory Visit (INDEPENDENT_AMBULATORY_CARE_PROVIDER_SITE_OTHER): Payer: BLUE CROSS/BLUE SHIELD | Admitting: Nurse Practitioner

## 2015-11-13 ENCOUNTER — Telehealth: Payer: Self-pay | Admitting: Rheumatology

## 2015-11-13 ENCOUNTER — Other Ambulatory Visit: Payer: Self-pay

## 2015-11-13 DIAGNOSIS — K625 Hemorrhage of anus and rectum: Secondary | ICD-10-CM

## 2015-11-13 DIAGNOSIS — R195 Other fecal abnormalities: Secondary | ICD-10-CM

## 2015-11-13 DIAGNOSIS — R1013 Epigastric pain: Secondary | ICD-10-CM

## 2015-11-13 LAB — CBC WITH DIFFERENTIAL/PLATELET
BASOS PCT: 0 %
Basophils Absolute: 0 cells/uL (ref 0–200)
EOS PCT: 1 %
Eosinophils Absolute: 70 cells/uL (ref 15–500)
HCT: 46.8 % (ref 38.5–50.0)
Hemoglobin: 15.5 g/dL (ref 13.2–17.1)
LYMPHS PCT: 31 %
Lymphs Abs: 2170 cells/uL (ref 850–3900)
MCH: 30.9 pg (ref 27.0–33.0)
MCHC: 33.1 g/dL (ref 32.0–36.0)
MCV: 93.2 fL (ref 80.0–100.0)
MONOS PCT: 8 %
MPV: 11.8 fL (ref 7.5–12.5)
Monocytes Absolute: 560 cells/uL (ref 200–950)
Neutro Abs: 4200 cells/uL (ref 1500–7800)
Neutrophils Relative %: 60 %
PLATELETS: 212 10*3/uL (ref 140–400)
RBC: 5.02 MIL/uL (ref 4.20–5.80)
RDW: 13.9 % (ref 11.0–15.0)
WBC: 7 10*3/uL (ref 3.8–10.8)

## 2015-11-13 LAB — COMPREHENSIVE METABOLIC PANEL
ALK PHOS: 47 U/L (ref 40–115)
ALT: 13 U/L (ref 9–46)
AST: 14 U/L (ref 10–40)
Albumin: 4.7 g/dL (ref 3.6–5.1)
BUN: 15 mg/dL (ref 7–25)
CO2: 26 mmol/L (ref 20–31)
Calcium: 9.8 mg/dL (ref 8.6–10.3)
Chloride: 102 mmol/L (ref 98–110)
Creat: 0.97 mg/dL (ref 0.60–1.35)
GLUCOSE: 90 mg/dL (ref 65–99)
POTASSIUM: 4.8 mmol/L (ref 3.5–5.3)
SODIUM: 138 mmol/L (ref 135–146)
Total Bilirubin: 1 mg/dL (ref 0.2–1.2)
Total Protein: 6.9 g/dL (ref 6.1–8.1)

## 2015-11-13 LAB — LIPASE: Lipase: 23 U/L (ref 7–60)

## 2015-11-13 MED ORDER — PEG-KCL-NACL-NASULF-NA ASC-C 100 G PO SOLR: 1.0000 | ORAL | 0 refills | Status: DC

## 2015-11-13 NOTE — Assessment & Plan Note (Signed)
New onset epigastric pain associated with 3 day episode of what sounds to be melena stools. His abdominal pain is new onset approximately 3-4 weeks ago. He takes intermittent NSAIDs, took [facility] powders a couple weeks ago. His symptoms are associated with nausea. This point I will check a CBC, CMP. Avoid trigger foods, NSAIDs, aspirin powders. We will proceed with upper endoscopy to further evaluate upper abdominal/epigastric pain in the setting of dark stools. PPI has not helped as of this far.  Proceed with EGD on propofol/MAC with Dr. Gala Romney in near future: the risks, benefits, and alternatives have been discussed with the patient in detail. The patient states understanding and desires to proceed.  The patient is not on any anticoagulants or antidepressants. He is on oxycodone 10 mg, Robaxin, and drinks 6-7 drinks twice a week. We will proceed with the procedure on propofol/MAC to promote adequate sedation.

## 2015-11-13 NOTE — Telephone Encounter (Signed)
Who is Sharyne Richters and what does she want me to fax ?

## 2015-11-13 NOTE — Assessment & Plan Note (Addendum)
The patient describes bright red blood per rectum in addition to change in bowel habits to be more frequent and looser. Bleeding is noted primarily on the toilet tissue and occurs essentially with every bowel movement. No overt hemorrhoid symptoms. At this point I'll check a CBC, CMP. We will proceed with colonoscopy for diagnostic purposes in addition to the endoscopy as noted below. Return for follow-up in 2 months.  Proceed with colonoscopy on propofol/MAC with Dr. Gala Romney in near future: the risks, benefits, and alternatives have been discussed with the patient in detail. The patient states understanding and desires to proceed.  The patient is not on any anticoagulants or antidepressants. He is on oxycodone 10 mg, Robaxin, and drinks 6-7 drinks twice a week. We will proceed with the procedure on propofol/MAC to promote adequate sedation.

## 2015-11-13 NOTE — Progress Notes (Signed)
Primary Care Physician:  Robert Bellow, MD Primary Gastroenterologist:  Dr. Gala Romney  Chief Complaint  Patient presents with  . Nausea  . Abdominal Pain    across mid abd area  . Rectal Bleeding    x3 wks, increase in bm's (soft), approx 3 bm/day, dark stool/blood when wipes    HPI:   Charles [name] is a 45 y.o. male who presents On referral from primary care for nausea, abdominal.  pain, rectal bleeding. PCP notes reviewed, last saw primary care on 11/05/2015 which notes continued abdominal pain with a soft abdomen, no organomegaly or mass. Pain described as right and left upper quadrants. He is noted to have been taking NSAIDs. He was started on Protonix 40 mg daily by primary care. They feel his symptoms are likely due to hemorrhoid bleeding but are concerned about possible peptic ulcer disease.  No colonoscopy or endoscopy records found in our system.  Today he states his pain is mid to upper abdomen, which varies from sharp to cramping. Denies significant esophageal burning/bitter taste. His pain started about 3-4 weeks ago. Has had nausea as well, no vomiting. Protonix hasn't helped. Denies dysphagia symptoms. Has had a change in his bowel habits, going more frequently typically 4 times a day, softer than his baseline typically variable between [place] 4-6. Has had toilet tissue hematochezia with every bowel movement in the past 3-4 weeks. Has had black tarry stools for about 3 days last week, which has resolved. He admits a lightheaded-dizziness lately as well. Denies fever, chills, unintentional weight loss. Denies chest pain, dyspnea, lightheadedness, syncope, near syncope. Denies any other upper or lower GI symptoms.  Has been using Anusol suppository which has not helped his rectal bleeding. Was previously on Xarelto s/p left hip replacement which was stopped mid-August, 2017.   Takes intermittent/occasionl NSAIDs. Took an ASA powder 2 weeks ago, none otherwise.  Past Medical  History:  Diagnosis Date  . Arthritis   . Chronic back pain   . Chronic neck pain   . Chronic shoulder pain   . COPD (chronic obstructive pulmonary disease) ([place])   . GERD (gastroesophageal reflux disease)    tums  OTC  medication  . Headache   . Hypothyroidism     Past Surgical History:  Procedure Laterality Date  . NECK SURGERY    . SHOULDER SURGERY    . TOTAL HIP ARTHROPLASTY Left 07/30/2015   Procedure: TOTAL HIP ARTHROPLASTY;  Surgeon: Garald Balding, MD;  Location: [place];  Service: Orthopedics;  Laterality: Left;    Current Outpatient Prescriptions  Medication Sig Dispense Refill  . ANUCORT-HC 25 MG suppository Place 1 suppository rectally daily.  0  . ENBREL SURECLICK 50 MG/ML injection once a week.   2  . levothyroxine (SYNTHROID, LEVOTHROID) 75 MCG tablet Take 75 mcg by mouth daily before breakfast.   4  . lidocaine (LIDODERM) 5 % Place 2 patches onto the skin daily. Remove & Discard patch after 12 hours or so a day 60 patch 5  . lisinopril (PRINIVIL,ZESTRIL) 10 MG tablet Take 10 mg by mouth daily.  0  . methocarbamol (ROBAXIN) 500 MG tablet Take 1 tablet (500 mg total) by mouth every 6 (six) hours as needed for muscle spasms. 40 tablet 0  . oxyCODONE-acetaminophen (PERCOCET) 10-325 MG tablet Take 1 tablet by mouth as needed.  0  . pantoprazole (PROTONIX) 40 MG tablet Take 40 mg by mouth daily.  0   No current facility-administered medications for  this visit.     Allergies as of 11/13/2015 - Review Complete 11/13/2015  Allergen Reaction Noted  . No known allergies  07/30/2015    Family History  Problem Relation Age of Onset  . Heart failure Mother   . Diabetes Mother   . Hypertension Mother   . Heart failure Father   . Prostate cancer Father   . Colon cancer Neg Hx     Social History   Social History  . Marital status: Married    Spouse name: N/A  . Number of children: N/A  . Years of education: N/A   Occupational History  . Not on file.    Social History Main Topics  . Smoking status: Former Smoker    Packs/day: 0.50    Types: Cigarettes    Quit date: 05/17/2015  . Smokeless tobacco: Never Used     Comment: USES VAPOR CIGARETTES   . Alcohol use 3.6 oz/week    6 Cans of beer per week     Comment: twice a week: 6-7 drinks per occurrance  . Drug use: No     Comment: states quit  . Sexual activity: Not on file   Other Topics Concern  . Not on file   Social History Narrative  . No narrative on file    Review of Systems: General: Negative for anorexia, weight loss, fever, chills, fatigue, weakness. ENT: Negative for hoarseness, difficulty swallowing. CV: Negative for chest pain, angina, palpitations, peripheral edema.  Respiratory: Negative for dyspnea at rest, cough, sputum, wheezing.  GI: See history of present illness. MS: Chronic joint pain.  Derm: Negative for rash or itching.   Endo: Negative for unusual weight change.  Heme: Negative for bruising or bleeding. Allergy: Negative for rash or hives.    Physical Exam: BP 126/69   Pulse 65   Temp 98.3 F (36.8 C) (Oral)   Ht 6' (1.829 m)   Wt 212 lb (96.2 kg)   BMI 28.75 kg/m  General:   Alert and oriented. Pleasant and cooperative. Well-nourished and well-developed.  Head:  Normocephalic and atraumatic. Eyes:  Without icterus, sclera clear and conjunctiva pink.  Ears:  Normal auditory acuity. Cardiovascular:  S1, S2 present without murmurs appreciated. Extremities without clubbing or edema. Respiratory:  Clear to auscultation bilaterally. No wheezes, rales, or rhonchi. No distress.  Gastrointestinal:  +BS, soft, and non-distended. Mild to moderate TTP epigastric/upper abdomen. No HSM noted. No guarding or rebound. No masses appreciated.  Rectal:  Deferred  Musculoskalatal:  Symmetrical without gross deformities. Skin:  Intact without significant lesions or rashes. Neurologic:  Alert and oriented x4;  grossly normal neurologically. Psych:  Alert and  cooperative. Normal mood and affect. Heme/Lymph/Immune: No excessive bruising noted.    11/13/2015 11:09 AM   Disclaimer: This note was dictated with voice recognition software. Similar sounding words can inadvertently be transcribed and may not be corrected upon review.

## 2015-11-13 NOTE — Progress Notes (Signed)
cc'ed to pcp °

## 2015-11-13 NOTE — Assessment & Plan Note (Signed)
The patient describes 2-3 day episode of dark stools, described as "dark and tarry." This is in the setting of epigastric pain for the past 3-4 weeks, NSAID use, aspirin powder use. I will check CBC, CMP, GERD education and endoscopy as noted below. Return for follow-up in 2 months.

## 2015-11-13 NOTE — Patient Instructions (Addendum)
1. We will schedule your procedures for you. 2. Avoid trigger foods. 3. Avoid NSAID medications (ibuprofen, Motrin, Advil, Aleve, naproxen, Naprosyn, etc.) 4. Do not take Goody powder or BC powders. 5. Further information about foods to avoid is found below. 6. Return for follow-up in 2 months.    Gastroesophageal Reflux Disease, Adult Normally, food travels down the esophagus and stays in the stomach to be digested. However, when a person has gastroesophageal reflux disease (GERD), food and stomach acid move back up into the esophagus. When this happens, the esophagus becomes sore and inflamed. Over time, GERD can create small holes (ulcers) in the lining of the esophagus.  CAUSES This condition is caused by a problem with the muscle between the esophagus and the stomach (lower esophageal sphincter, or LES). Normally, the LES muscle closes after food passes through the esophagus to the stomach. When the LES is weakened or abnormal, it does not close properly, and that allows food and stomach acid to go back up into the esophagus. The LES can be weakened by certain dietary substances, medicines, and medical conditions, including:  Tobacco use.  Pregnancy.  Having a hiatal hernia.  Heavy alcohol use.  Certain foods and beverages, such as coffee, chocolate, onions, and peppermint. RISK FACTORS This condition is more likely to develop in:  People who have an increased body weight.  People who have connective tissue disorders.  People who use NSAID medicines. SYMPTOMS Symptoms of this condition include:  Heartburn.  Difficult or painful swallowing.  The feeling of having a lump in the throat.  Abitter taste in the mouth.  Bad breath.  Having a large amount of saliva.  Having an upset or bloated stomach.  Belching.  Chest pain.  Shortness of breath or wheezing.  Ongoing (chronic) cough or a night-time cough.  Wearing away of tooth enamel.  Weight loss. Different  conditions can cause chest pain. Make sure to see your health care provider if you experience chest pain. DIAGNOSIS Your health care provider will take a medical history and perform a physical exam. To determine if you have mild or severe GERD, your health care provider may also monitor how you respond to treatment. You may also have other tests, including:  An endoscopy toexamine your stomach and esophagus with a small camera.  A test thatmeasures the acidity level in your esophagus.  A test thatmeasures how much pressure is on your esophagus.  A barium swallow or modified barium swallow to show the shape, size, and functioning of your esophagus. TREATMENT The goal of treatment is to help relieve your symptoms and to prevent complications. Treatment for this condition may vary depending on how severe your symptoms are. Your health care provider may recommend:  Changes to your diet.  Medicine.  Surgery. HOME CARE INSTRUCTIONS Diet  Follow a diet as recommended by your health care provider. This may involve avoiding foods and drinks such as:  Coffee and tea (with or without caffeine).  Drinks that containalcohol.  Energy drinks and sports drinks.  Carbonated drinks or sodas.  Chocolate and cocoa.  Peppermint and mint flavorings.  Garlic and onions.  Horseradish.  Spicy and acidic foods, including peppers, chili powder, curry powder, vinegar, hot sauces, and barbecue sauce.  Citrus fruit juices and citrus fruits, such as oranges, lemons, and limes.  Tomato-based foods, such as red sauce, chili, salsa, and pizza with red sauce.  Fried and fatty foods, such as donuts, french fries, potato chips, and high-fat dressings.  High-fat  meats, such as hot dogs and fatty cuts of red and white meats, such as rib eye steak, sausage, ham, and bacon.  High-fat dairy items, such as whole milk, butter, and cream cheese.  Eat small, frequent meals instead of large meals.  Avoid  drinking large amounts of liquid with your meals.  Avoid eating meals during the 2-3 hours before bedtime.  Avoid lying down right after you eat.  Do not exercise right after you eat. General Instructions  Pay attention to any changes in your symptoms.  Take over-the-counter and prescription medicines only as told by your health care provider. Do not take aspirin, ibuprofen, or other NSAIDs unless your health care provider told you to do so.  Do not use any tobacco products, including cigarettes, chewing tobacco, and e-cigarettes. If you need help quitting, ask your health care provider.  Wear loose-fitting clothing. Do not wear anything tight around your waist that causes pressure on your abdomen.  Raise (elevate) the head of your bed 6 inches (15cm).  Try to reduce your stress, such as with yoga or meditation. If you need help reducing stress, ask your health care provider.  If you are overweight, reduce your weight to an amount that is healthy for you. Ask your health care provider for guidance about a safe weight loss goal.  Keep all follow-up visits as told by your health care provider. This is important. SEEK MEDICAL CARE IF:  You have new symptoms.  You have unexplained weight loss.  You have difficulty swallowing, or it hurts to swallow.  You have wheezing or a persistent cough.  Your symptoms do not improve with treatment.  You have a hoarse voice. SEEK IMMEDIATE MEDICAL CARE IF:  You have pain in your arms, neck, jaw, teeth, or back.  You feel sweaty, dizzy, or light-headed.  You have chest pain or shortness of breath.  You vomit and your vomit looks like blood or coffee grounds.  You faint.  Your stool is bloody or black.  You cannot swallow, drink, or eat.   This information is not intended to replace advice given to you by your health care provider. Make sure you discuss any questions you have with your health care provider.   Document Released:  10/01/2004 Document Revised: 09/12/2014 Document Reviewed: 04/18/2014 Elsevier Interactive Patient Education [employer].

## 2015-11-13 NOTE — Telephone Encounter (Signed)
Charles [name] is calling for prior-authorization status on Enbrel. Fax# [phone]

## 2015-11-15 NOTE — Telephone Encounter (Signed)
Jaylen from [employer] needs a prior auth for Enbrel.

## 2015-11-21 ENCOUNTER — Telehealth: Payer: Self-pay

## 2015-11-21 NOTE — Telephone Encounter (Signed)
Ok to move as schedule permits.

## 2015-11-21 NOTE — Telephone Encounter (Signed)
Pt called to see if we could move his TCS up sooner because he is still bleeding and hurting. Please advise

## 2015-11-22 ENCOUNTER — Telehealth: Payer: Self-pay | Admitting: Internal Medicine

## 2015-11-22 NOTE — Telephone Encounter (Signed)
Called pt and informed him that d/t to scheduling we are unable to move his procedure up any sooner. Spoke to [place] and she advised if his symptoms get any worse he will need to go to ER. Called and informed pt. Advised him I would also let EG know. Pt said he would keep appt for 12/09/15 and if he gets worse he will go to the ER.

## 2015-11-22 NOTE — Telephone Encounter (Signed)
Pt called to say that he is scheduled with [facility] on 12/4 and his problems are getting worse and wanted to move his procedure up to something sooner. Please call him back at [phone]

## 2015-11-25 NOTE — Telephone Encounter (Signed)
Noted, no further ecommendations

## 2015-12-02 ENCOUNTER — Encounter (INDEPENDENT_AMBULATORY_CARE_PROVIDER_SITE_OTHER): Payer: Self-pay | Admitting: Orthopaedic Surgery

## 2015-12-02 ENCOUNTER — Ambulatory Visit (INDEPENDENT_AMBULATORY_CARE_PROVIDER_SITE_OTHER): Payer: BLUE CROSS/BLUE SHIELD | Admitting: Orthopaedic Surgery

## 2015-12-02 VITALS — BP 128/68 | HR 68 | Ht 72.0 in | Wt 215.0 lb

## 2015-12-02 DIAGNOSIS — Z96642 Presence of left artificial hip joint: Secondary | ICD-10-CM

## 2015-12-02 NOTE — Patient Instructions (Signed)
NUBAID FEKETE  12/02/2015     @PREFPERIOPPHARMACY @   Your procedure is scheduled on 12/09/2015.  Report to [facility] at 8:45 A.M.  Call this number if you have problems the morning of surgery:  [phone]   Remember:  Do not eat food or drink liquids after midnight.  Take these medicines the morning of surgery with A SIP OF WATER Synthroid, Lisinopril, Robaxin, Oxycodone, Protonix   Do not wear jewelry, make-up or nail polish.  Do not wear lotions, powders, or perfumes, or deoderant.  Do not shave 48 hours prior to surgery.  Men may shave face and neck.  Do not bring valuables to the hospital.  [facility] is not responsible for any belongings or valuables.  Contacts, dentures or bridgework may not be worn into surgery.  Leave your suitcase in the car.  After surgery it may be brought to your room.  For patients admitted to the hospital, discharge time will be determined by your treatment team.  Patients discharged the day of surgery will not be allowed to drive home.    Please read over the following fact sheets that you were given. Anesthesia Post-op Instructions     PATIENT INSTRUCTIONS POST-ANESTHESIA  IMMEDIATELY FOLLOWING SURGERY:  Do not drive or operate machinery for the first twenty four hours after surgery.  Do not make any important decisions for twenty four hours after surgery or while taking narcotic pain medications or sedatives.  If you develop intractable nausea and vomiting or a severe headache please notify your doctor immediately.  FOLLOW-UP:  Please make an appointment with your surgeon as instructed. You do not need to follow up with anesthesia unless specifically instructed to do so.  WOUND CARE INSTRUCTIONS (if applicable):  Keep a dry clean dressing on the anesthesia/puncture wound site if there is drainage.  Once the wound has quit draining you may leave it open to air.  Generally you should leave the bandage intact for twenty four hours unless  there is drainage.  If the epidural site drains for more than 36-48 hours please call the anesthesia department.  QUESTIONS?:  Please feel free to call your physician or the hospital operator if you have any questions, and they will be happy to assist you.      Colonoscopy, Adult A colonoscopy is an exam to look at the entire large intestine. During the exam, a lubricated, bendable tube is inserted into the anus and then passed into the rectum, colon, and other parts of the large intestine. A colonoscopy is often done as a part of normal colorectal screening or in response to certain symptoms, such as anemia, persistent diarrhea, abdominal pain, and blood in the stool. The exam can help screen for and diagnose medical problems, including:  Tumors.  Polyps.  Inflammation.  Areas of bleeding. Tell a health care provider about:  Any allergies you have.  All medicines you are taking, including vitamins, herbs, eye drops, creams, and over-the-counter medicines.  Any problems you or family members have had with anesthetic medicines.  Any blood disorders you have.  Any surgeries you have had.  Any medical conditions you have.  Any problems you have had passing stool. What are the risks? Generally, this is a safe procedure. However, problems may occur, including:  Bleeding.  A tear in the intestine.  A reaction to medicines given during the exam.  Infection (rare). What happens before the procedure? Eating and drinking restrictions  Follow instructions from your health care  provider about eating and drinking, which may include:  A few days before the procedure - follow a low-fiber diet. Avoid nuts, seeds, dried fruit, raw fruits, and vegetables.  1-3 days before the procedure - follow a clear liquid diet. Drink only clear liquids, such as clear broth or bouillon, black coffee or tea, clear juice, clear soft drinks or sports drinks, gelatin desert, and popsicles. Avoid any  liquids that contain red or purple dye.  On the day of the procedure - do not eat or drink anything during the 2 hours before the procedure, or within the time period that your health care provider recommends. Bowel prep  If you were prescribed an oral bowel prep to clean out your colon:  Take it as told by your health care provider. Starting the day before your procedure, you will need to drink a large amount of medicated liquid. The liquid will cause you to have multiple loose stools until your stool is almost clear or light green.  If your skin or anus gets irritated from diarrhea, you may use these to relieve the irritation:  Medicated wipes, such as adult wet wipes with aloe and vitamin E.  A skin soothing-product like petroleum jelly.  If you vomit while drinking the bowel prep, take a break for up to 60 minutes and then begin the bowel prep again. If vomiting continues and you cannot take the bowel prep without vomiting, call your health care provider. General instructions  Ask your health care provider about changing or stopping your regular medicines. This is especially important if you are taking diabetes medicines or blood thinners.  Plan to have someone take you home from the hospital or clinic. What happens during the procedure?  An IV tube may be inserted into one of your veins.  You will be given medicine to help you relax (sedative).  To reduce your risk of infection:  Your health care team will wash or sanitize their hands.  Your anal area will be washed with soap.  You will be asked to lie on your side with your knees bent.  Your health care provider will lubricate a long, thin, flexible tube. The tube will have a camera and a light on the end.  The tube will be inserted into your anus.  The tube will be gently eased through your rectum and colon.  Air will be delivered into your colon to keep it open. You may feel some pressure or cramping.  The camera  will be used to take images during the procedure.  A small tissue sample may be removed from your body to be examined under a microscope (biopsy). If any potential problems are found, the tissue will be sent to a lab for testing.  If small polyps are found, your health care provider may remove them and have them checked for cancer cells.  The tube that was inserted into your anus will be slowly removed. The procedure may vary among health care providers and hospitals. What happens after the procedure?  Your blood pressure, heart rate, breathing rate, and blood oxygen level will be monitored until the medicines you were given have worn off.  Do not drive for 24 hours after the exam.  You may have a small amount of blood in your stool.  You may pass gas and have mild abdominal cramping or bloating due to the air that was used to inflate your colon during the exam.  It is up to you to get the  results of your procedure. Ask your health care provider, or the department performing the procedure, when your results will be ready. This information is not intended to replace advice given to you by your health care provider. Make sure you discuss any questions you have with your health care provider. Document Released: 12/20/1999 Document Revised: 07/12/2015 Document Reviewed: 03/05/2015 Elsevier Interactive Patient Education  2017 [employer].

## 2015-12-02 NOTE — Progress Notes (Signed)
Office Visit Note   Patient: Charles [name]           Date of Birth: Jan 30, 1970           MRN: [id number] Visit Date: 12/02/2015              Requested by: Lemmie Evens, MD [place], [place] [address] PCP: Robert Bellow, MD   Assessment & Plan: Visit Diagnoses: No diagnosis found.  Plan: f/u 6 months  Follow-Up Instructions: No Follow-up on file.   Orders:  No orders of the defined types were placed in this encounter.  No orders of the defined types were placed in this encounter.     Procedures: No procedures performed   Clinical Data: No additional findings.   Subjective: No chief complaint on file. Charles [name] relates that he still doing very well in regards to his left total hip replacement. He has been working light duty but feels like he is now ready to perform his regular duty activity. His employer has suggested that he can be careful in that normal work environment. He is not taking any pain medicine and does not experience any the pain that he had prior to the hip surgery.  Pt here for follow up from Left THA  On 07/30/15.    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 Maryjean Ka, neurosurgeon is going to "burn a nerve" on left side       Review of Systems   Objective: Vital Signs: There were no vitals taken for this visit.  Physical Exam  Ortho Exam exam of the left hip reveals normal motion without any restriction or pain he has had a little discomfort on the right side but no pain with internal/external rotation. There is no swelling of his left lower extremity he may have slight elongation of the left lower extremity compared to the right but is not a functional limitation. Neurovascular exam is intact. Straight leg raise was negative.  Specialty Comments:  No specialty comments available.  Imaging: No results found.   PMFS History: Patient Active Problem List   Diagnosis Date Noted  . Abdominal pain, epigastric 11/13/2015    . Dark stools 11/13/2015  . Rectal bleeding 11/13/2015  . Numbness 09/26/2015  . 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 Avascular necrosis of left femoral head ([employer]) 07/30/2015  . Avascular necrosis of right femoral head ([place]) 07/30/2015  . Psoriasis 07/30/2015  . Chronic obstructive pulmonary disease (COPD) ([place]) 07/30/2015  . 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   Past Medical History:  Diagnosis Date  . Arthritis   . Chronic back pain   . Chronic neck pain   . Chronic shoulder pain   . COPD (chronic obstructive pulmonary disease) ([place])   . GERD (gastroesophageal reflux disease)    tums  OTC  medication  . Headache   . Hypothyroidism     Family History  Problem Relation Age of Onset  . Heart failure Mother   . Diabetes Mother   . Hypertension Mother   . Heart failure Father   . Prostate cancer Father   . Colon cancer Neg Hx     Past Surgical History:  Procedure Laterality Date  . NECK SURGERY    . SHOULDER SURGERY    . TOTAL HIP ARTHROPLASTY Left 07/30/2015   Procedure: TOTAL HIP ARTHROPLASTY;  Surgeon: Garald Balding, MD;  Location: [facility];  Service: Orthopedics;  Laterality: Left;   Social History   Occupational History  . Not on file.   Social History Main Topics  . Smoking status: Former Smoker    Packs/day: 0.50    Types: Cigarettes    Quit date: 05/17/2015  . Smokeless tobacco: Never Used     Comment: USES VAPOR CIGARETTES   . Alcohol use 3.6 oz/week    6 Cans of beer per week     Comment: twice a week: 6-7 drinks per occurrance  . Drug use: No     Comment: states quit  . Sexual activity: Not on file

## 2015-12-04 ENCOUNTER — Inpatient Hospital Stay (HOSPITAL_COMMUNITY)
Admission: RE | Admit: 2015-12-04 | Discharge: 2015-12-04 | Disposition: A | Discharge status: Home / Self Care | Payer: BLUE CROSS/BLUE SHIELD | Source: Ambulatory Visit

## 2015-12-04 ENCOUNTER — Encounter (HOSPITAL_COMMUNITY)
Admission: RE | Admit: 2015-12-04 | Discharge: 2015-12-04 | Disposition: A | Discharge status: Home / Self Care | Payer: BLUE CROSS/BLUE SHIELD | Source: Ambulatory Visit | Attending: Internal Medicine | Admitting: Internal Medicine

## 2015-12-09 ENCOUNTER — Ambulatory Visit (HOSPITAL_COMMUNITY): Payer: BLUE CROSS/BLUE SHIELD | Admitting: Anesthesiology

## 2015-12-09 ENCOUNTER — Encounter (HOSPITAL_COMMUNITY)
Admission: RE | Disposition: A | Discharge status: Home / Self Care | Payer: Self-pay | Source: Ambulatory Visit | Attending: Internal Medicine

## 2015-12-09 ENCOUNTER — Ambulatory Visit (HOSPITAL_COMMUNITY)
Admission: RE | Admit: 2015-12-09 | Discharge: 2015-12-09 | Disposition: A | Discharge status: Home / Self Care | Payer: BLUE CROSS/BLUE SHIELD | Source: Ambulatory Visit | Attending: Internal Medicine | Admitting: Internal Medicine

## 2015-12-09 ENCOUNTER — Encounter (HOSPITAL_COMMUNITY): Payer: Self-pay | Admitting: *Deleted

## 2015-12-09 DIAGNOSIS — D125 Benign neoplasm of sigmoid colon: Secondary | ICD-10-CM | POA: Diagnosis not present

## 2015-12-09 DIAGNOSIS — J449 Chronic obstructive pulmonary disease, unspecified: Secondary | ICD-10-CM | POA: Insufficient documentation

## 2015-12-09 DIAGNOSIS — D124 Benign neoplasm of descending colon: Secondary | ICD-10-CM | POA: Diagnosis not present

## 2015-12-09 DIAGNOSIS — K921 Melena: Secondary | ICD-10-CM | POA: Diagnosis not present

## 2015-12-09 DIAGNOSIS — K209 Esophagitis, unspecified: Secondary | ICD-10-CM | POA: Diagnosis not present

## 2015-12-09 DIAGNOSIS — R1013 Epigastric pain: Secondary | ICD-10-CM

## 2015-12-09 DIAGNOSIS — G8929 Other chronic pain: Secondary | ICD-10-CM | POA: Insufficient documentation

## 2015-12-09 DIAGNOSIS — Z96642 Presence of left artificial hip joint: Secondary | ICD-10-CM | POA: Insufficient documentation

## 2015-12-09 DIAGNOSIS — K625 Hemorrhage of anus and rectum: Secondary | ICD-10-CM

## 2015-12-09 DIAGNOSIS — K210 Gastro-esophageal reflux disease with esophagitis: Secondary | ICD-10-CM | POA: Insufficient documentation

## 2015-12-09 DIAGNOSIS — E039 Hypothyroidism, unspecified: Secondary | ICD-10-CM | POA: Diagnosis not present

## 2015-12-09 DIAGNOSIS — Z87891 Personal history of nicotine dependence: Secondary | ICD-10-CM | POA: Insufficient documentation

## 2015-12-09 HISTORY — PX: COLONOSCOPY WITH PROPOFOL: SHX5780

## 2015-12-09 HISTORY — PX: ESOPHAGOGASTRODUODENOSCOPY (EGD) WITH PROPOFOL: SHX5813

## 2015-12-09 SURGERY — COLONOSCOPY WITH PROPOFOL
Anesthesia: Monitor Anesthesia Care

## 2015-12-09 MED ORDER — MIDAZOLAM HCL 2 MG/2ML IJ SOLN
INTRAMUSCULAR | Status: AC
  Filled 2015-12-09: qty 2

## 2015-12-09 MED ORDER — MIDAZOLAM HCL 2 MG/2ML IJ SOLN
1.0000 mg | INTRAMUSCULAR | Status: DC | PRN
  Administered 2015-12-09 (×2): 1 mg via INTRAVENOUS

## 2015-12-09 MED ORDER — LACTATED RINGERS IV SOLN
INTRAVENOUS | Status: DC
  Administered 2015-12-09: 09:00:00 via INTRAVENOUS

## 2015-12-09 MED ORDER — PROPOFOL 10 MG/ML IV BOLUS
INTRAVENOUS | Status: AC
  Filled 2015-12-09: qty 20

## 2015-12-09 MED ORDER — LIDOCAINE VISCOUS 2 % MT SOLN
OROMUCOSAL | Status: AC
  Filled 2015-12-09: qty 15

## 2015-12-09 MED ORDER — LIDOCAINE HCL (CARDIAC) 10 MG/ML IV SOLN
INTRAVENOUS | Status: DC | PRN
  Administered 2015-12-09: 50 mg via INTRAVENOUS

## 2015-12-09 MED ORDER — FENTANYL CITRATE (PF) 100 MCG/2ML IJ SOLN
25.0000 ug | INTRAMUSCULAR | Status: AC | PRN
  Administered 2015-12-09 (×2): 25 ug via INTRAVENOUS

## 2015-12-09 MED ORDER — CHLORHEXIDINE GLUCONATE CLOTH 2 % EX PADS: 6.0000 | MEDICATED_PAD | Freq: Once | CUTANEOUS | Status: DC

## 2015-12-09 MED ORDER — FENTANYL CITRATE (PF) 100 MCG/2ML IJ SOLN
INTRAMUSCULAR | Status: AC
  Filled 2015-12-09: qty 2

## 2015-12-09 MED ORDER — PROPOFOL 500 MG/50ML IV EMUL
INTRAVENOUS | Status: DC | PRN
  Administered 2015-12-09 (×2): 100 ug/kg/min via INTRAVENOUS
  Administered 2015-12-09: 125 ug/kg/min via INTRAVENOUS

## 2015-12-09 MED ORDER — LIDOCAINE HCL (PF) 1 % IJ SOLN
INTRAMUSCULAR | Status: AC
  Filled 2015-12-09: qty 5

## 2015-12-09 MED ORDER — PROPOFOL 10 MG/ML IV BOLUS
INTRAVENOUS | Status: DC | PRN
  Administered 2015-12-09 (×4): 20 mg via INTRAVENOUS

## 2015-12-09 NOTE — Anesthesia Procedure Notes (Signed)
Procedure Name: MAC Date/Time: 12/09/2015 9:17 AM Performed by: Vista Deck Pre-anesthesia Checklist: Patient identified, Emergency Drugs available, Suction available, Timeout performed and Patient being monitored Patient Re-evaluated:Patient Re-evaluated prior to inductionOxygen Delivery Method: Non-rebreather mask

## 2015-12-09 NOTE — Anesthesia Postprocedure Evaluation (Signed)
Anesthesia Post Note  Patient: Charles [name]  Procedure(s) Performed: Procedure(s) (LRB): COLONOSCOPY WITH PROPOFOL (N/A) ESOPHAGOGASTRODUODENOSCOPY (EGD) WITH PROPOFOL (N/A)  Patient location during evaluation: Short Stay Anesthesia Type: MAC Level of consciousness: awake and alert Pain management: pain level controlled Vital Signs Assessment: post-procedure vital signs reviewed and stable Respiratory status: spontaneous breathing Cardiovascular status: stable Anesthetic complications: no    Last Vitals:  Vitals:   12/09/15 1027 12/09/15 1040  BP: 122/72 124/83  Pulse: 69 68  Resp: 15 15  Temp:  36.7 C    Last Pain:  Vitals:   12/09/15 1040  TempSrc: Oral                 Kale Rondeau

## 2015-12-09 NOTE — Op Note (Signed)
[facility] Patient Name: Charles [name] Procedure Date: 12/09/2015 9:20 AM MRN: [id number] Date of Birth: 1970/08/28 Attending MD: Norvel Richards , MD CSN: [id number] Age: 45 Admit Type: Outpatient Procedure:                Upper GI endoscopy - diagnostic Indications:              Epigastric abdominal pain Providers:                Norvel Richards, MD, Hinton Rao, RN, Sherlyn Lees, Technician Referring MD:              Medicines:                Propofol per Anesthesia Complications:            No immediate complications. Estimated Blood Loss:     Estimated blood loss: none. Procedure:                Pre-Anesthesia Assessment:                           - Prior to the procedure, a History and Physical                            was performed, and patient medications and                            allergies were reviewed. The patient's tolerance of                            previous anesthesia was also reviewed. The risks                            and benefits of the procedure and the sedation                            options and risks were discussed with the patient.                            All questions were answered, and informed consent                            was obtained. Prior Anticoagulants: The patient has                            taken no previous anticoagulant or antiplatelet                            agents. ASA Grade Assessment: II - A patient with                            mild systemic disease. After reviewing the risks  and benefits, the patient was deemed in                            satisfactory condition to undergo the procedure.                           After obtaining informed consent, the endoscope was                            passed under direct vision. Throughout the                            procedure, the patient's blood pressure, pulse, and   oxygen saturations were monitored continuously. The                            [id number]) was introduced through the and                            advanced to the second part of duodenum. The                            patient tolerated the procedure well. The upper GI                            endoscopy was accomplished without difficulty. Scope In: 9:30:47 AM Scope Out: 9:33:59 AM Total Procedure Duration: 0 hours 3 minutes 12 seconds  Findings:      LA Grade B (one or more mucosal breaks greater than 5 mm, not extending       between the tops of two mucosal folds) esophagitis was found 35 to 37 cm       from the incisors. No Barrett's epithelium seen.      The entire examined stomach was normal.      The duodenal bulb and second portion of the duodenum were normal. Impression:               - LA Grade B esophagitis.                           - Normal stomach.                           - Normal duodenal bulb and second portion of the                            duodenum.                           - No specimens collected. Moderate Sedation:      Moderate (conscious) sedation was personally administered by an       anesthesia professional. The following parameters were monitored: oxygen       saturation, heart rate, blood pressure, respiratory rate, EKG, adequacy       of pulmonary ventilation, and response to care. Total physician       intraservice time was 15 minutes. Recommendation:           -  Patient has a contact number available for                            emergencies. The signs and symptoms of potential                            delayed complications were discussed with the                            patient. Return to normal activities tomorrow.                            Written discharge instructions were provided to the                            patient.                           - Resume previous diet.                           - Continue present  medications. Increase Protonix                            to 40 mg twice daily. Stop drinking beer.                           - No repeat upper endoscopy.                           - Return to GI office in 3 months. Procedure Code(s):        --- Professional ---                           [phone], Esophagogastroduodenoscopy, flexible,                            transoral; diagnostic, including collection of                            specimen(s) by brushing or washing, when performed                            (separate procedure) Diagnosis Code(s):        --- Professional ---                           K20.9, Esophagitis, unspecified                           R10.13, Epigastric pain CPT copyright 2016 American Medical Association. All rights reserved. The codes documented in this report are preliminary and upon coder review may  be revised to meet current compliance requirements. Cristopher Estimable. Kaj Vasil, MD Norvel Richards, MD 12/09/2015 9:39:32 AM This report has been signed electronically. Number of Addenda: 0

## 2015-12-09 NOTE — Discharge Instructions (Addendum)
°Colonoscopy °Discharge Instructions ° °Read the instructions outlined below and refer to this sheet in the next few weeks. These discharge instructions provide you with general information on caring for yourself after you leave the hospital. Your doctor may also give you specific instructions. While your treatment has been planned according to the most current medical practices available, unavoidable complications occasionally occur. If you have any problems or questions after discharge, call Dr. Rourk at [phone]. °ACTIVITY °· You may resume your regular activity, but move at a slower pace for the next 24 hours.  °· Take frequent rest periods for the next 24 hours.  °· Walking will help get rid of the air and reduce the bloated feeling in your belly (abdomen).  °· No driving for 24 hours (because of the medicine (anesthesia) used during the test).   °· Do not sign any important legal documents or operate any machinery for 24 hours (because of the anesthesia used during the test).  °NUTRITION °· Drink plenty of fluids.  °· You may resume your normal diet as instructed by your doctor.  °· Begin with a light meal and progress to your normal diet. Heavy or fried foods are harder to digest and may make you feel sick to your stomach (nauseated).  °· Avoid alcoholic beverages for 24 hours or as instructed.  °MEDICATIONS °· You may resume your normal medications unless your doctor tells you otherwise.  °WHAT YOU CAN EXPECT TODAY °· Some feelings of bloating in the abdomen.  °· Passage of more gas than usual.  °· Spotting of blood in your stool or on the toilet paper.  °IF YOU HAD POLYPS REMOVED DURING THE COLONOSCOPY: °· No aspirin products for 7 days or as instructed.  °· No alcohol for 7 days or as instructed.  °· Eat a soft diet for the next 24 hours.  °FINDING OUT THE RESULTS OF YOUR TEST °Not all test results are available during your visit. If your test results are not back during the visit, make an appointment  with your caregiver to find out the results. Do not assume everything is normal if you have not heard from your caregiver or the medical facility. It is important for you to follow up on all of your test results.  °SEEK IMMEDIATE MEDICAL ATTENTION IF: °· You have more than a spotting of blood in your stool.  °· Your belly is swollen (abdominal distention).  °· You are nauseated or vomiting.  °· You have a temperature over 101.  °· You have abdominal pain or discomfort that is severe or gets worse throughout the day.  °EGD °Discharge instructions °Please read the instructions outlined below and refer to this sheet in the next few weeks. These discharge instructions provide you with general information on caring for yourself after you leave the hospital. Your doctor may also give you specific instructions. While your treatment has been planned according to the most current medical practices available, unavoidable complications occasionally occur. If you have any problems or questions after discharge, please call your doctor. °ACTIVITY °· You may resume your regular activity but move at a slower pace for the next 24 hours.  °· Take frequent rest periods for the next 24 hours.  °· Walking will help expel (get rid of) the air and reduce the bloated feeling in your abdomen.  °· No driving for 24 hours (because of the anesthesia (medicine) used during the test).  °· You may shower.  °· Do not sign any important   legal documents or operate any machinery for 24 hours (because of the anesthesia used during the test).  NUTRITION  Drink plenty of fluids.   You may resume your normal diet.   Begin with a light meal and progress to your normal diet.   Avoid alcoholic beverages for 24 hours or as instructed by your caregiver.  MEDICATIONS  You may resume your normal medications unless your caregiver tells you otherwise.  WHAT YOU CAN EXPECT TODAY  You may experience abdominal discomfort such as a feeling of fullness  or gas pains.  FOLLOW-UP  Your doctor will discuss the results of your test with you.  SEEK IMMEDIATE MEDICAL ATTENTION IF ANY OF THE FOLLOWING OCCUR:  Excessive nausea (feeling sick to your stomach) and/or vomiting.   Severe abdominal pain and distention (swelling).   Trouble swallowing.   Temperature over 101 F (37.8 C).   Rectal bleeding or vomiting of blood.    Gastroesophageal Reflux Disease, Adult Normally, food travels down the esophagus and stays in the stomach to be digested. However, when a person has gastroesophageal reflux disease (GERD), food and stomach acid move back up into the esophagus. When this happens, the esophagus becomes sore and inflamed. Over time, GERD can create small holes (ulcers) in the lining of the esophagus. What are the causes? This condition is caused by a problem with the muscle between the esophagus and the stomach (lower esophageal sphincter, or LES). Normally, the LES muscle closes after food passes through the esophagus to the stomach. When the LES is weakened or abnormal, it does not close properly, and that allows food and stomach acid to go back up into the esophagus. The LES can be weakened by certain dietary substances, medicines, and medical conditions, including:  Tobacco use.  Pregnancy.  Having a hiatal hernia.  Heavy alcohol use.  Certain foods and beverages, such as coffee, chocolate, onions, and peppermint. What increases the risk? This condition is more likely to develop in:  People who have an increased body weight.  People who have connective tissue disorders.  People who use NSAID medicines. What are the signs or symptoms? Symptoms of this condition include:  Heartburn.  Difficult or painful swallowing.  The feeling of having a lump in the throat.  Abitter taste in the mouth.  Bad breath.  Having a large amount of saliva.  Having an upset or bloated stomach.  Belching.  Chest pain.  Shortness of  breath or wheezing.  Ongoing (chronic) cough or a night-time cough.  Wearing away of tooth enamel.  Weight loss. Different conditions can cause chest pain. Make sure to see your health care provider if you experience chest pain. How is this diagnosed? Your health care provider will take a medical history and perform a physical exam. To determine if you have mild or severe GERD, your health care provider may also monitor how you respond to treatment. You may also have other tests, including:  An endoscopy toexamine your stomach and esophagus with a small camera.  A test thatmeasures the acidity level in your esophagus.  A test thatmeasures how much pressure is on your esophagus.  A barium swallow or modified barium swallow to show the shape, size, and functioning of your esophagus. How is this treated? The goal of treatment is to help relieve your symptoms and to prevent complications. Treatment for this condition may vary depending on how severe your symptoms are. Your health care provider may recommend:  Changes to your diet.  Medicine.  Surgery. Follow these instructions at home: Diet  Follow a diet as recommended by your health care provider. This may involve avoiding foods and drinks such as:  Coffee and tea (with or without caffeine).  Drinks that containalcohol.  Energy drinks and sports drinks.  Carbonated drinks or sodas.  Chocolate and cocoa.  Peppermint and mint flavorings.  Garlic and onions.  Horseradish.  Spicy and acidic foods, including peppers, chili powder, curry powder, vinegar, hot sauces, and barbecue sauce.  Citrus fruit juices and citrus fruits, such as oranges, lemons, and limes.  Tomato-based foods, such as red sauce, chili, salsa, and pizza with red sauce.  Fried and fatty foods, such as donuts, french fries, potato chips, and high-fat dressings.  High-fat meats, such as hot dogs and fatty cuts of red and white meats, such as rib eye  steak, sausage, ham, and bacon.  High-fat dairy items, such as whole milk, butter, and cream cheese.  Eat small, frequent meals instead of large meals.  Avoid drinking large amounts of liquid with your meals.  Avoid eating meals during the 2-3 hours before bedtime.  Avoid lying down right after you eat.  Do not exercise right after you eat. General instructions  Pay attention to any changes in your symptoms.  Take over-the-counter and prescription medicines only as told by your health care provider. Do not take aspirin, ibuprofen, or other NSAIDs unless your health care provider told you to do so.  Do not use any tobacco products, including cigarettes, chewing tobacco, and e-cigarettes. If you need help quitting, ask your health care provider.  Wear loose-fitting clothing. Do not wear anything tight around your waist that causes pressure on your abdomen.  Raise (elevate) the head of your bed 6 inches (15cm).  Try to reduce your stress, such as with yoga or meditation. If you need help reducing stress, ask your health care provider.  If you are overweight, reduce your weight to an amount that is healthy for you. Ask your health care provider for guidance about a safe weight loss goal.  Keep all follow-up visits as told by your health care provider. This is important. Contact a health care provider if:  You have new symptoms.  You have unexplained weight loss.  You have difficulty swallowing, or it hurts to swallow.  You have wheezing or a persistent cough.  Your symptoms do not improve with treatment.  You have a hoarse voice. Get help right away if:  You have pain in your arms, neck, jaw, teeth, or back.  You feel sweaty, dizzy, or light-headed.  You have chest pain or shortness of breath.  You vomit and your vomit looks like blood or coffee grounds.  You faint.  Your stool is bloody or black.  You cannot swallow, drink, or eat. This information is not  intended to replace advice given to you by your health care provider. Make sure you discuss any questions you have with your health care provider. Document Released: 10/01/2004 Document Revised: 05/22/2015 Document Reviewed: 04/18/2014 Elsevier Interactive Patient Education  2017 [place] had multiple colonic polyps and severe reflux esophagitis  GERD information provided  Increase Protonix to 40 mg twice daily  Cut back on beer consumption  Office visit with [place] in 3 months  Further recommendations to follow pending review of pathology report

## 2015-12-09 NOTE — H&P (View-Only) (Signed)
Primary Care Physician:  Robert Bellow, MD Primary Gastroenterologist:  Dr. Gala Romney  Chief Complaint  Patient presents with  . Nausea  . Abdominal Pain    across mid abd area  . Rectal Bleeding    x3 wks, increase in bm's (soft), approx 3 bm/day, dark stool/blood when wipes    HPI:   Charles [name] is a 45 y.o. male who presents On referral from primary care for nausea, abdominal.  pain, rectal bleeding. PCP notes reviewed, last saw primary care on 11/05/2015 which notes continued abdominal pain with a soft abdomen, no organomegaly or mass. Pain described as right and left upper quadrants. He is noted to have been taking NSAIDs. He was started on Protonix 40 mg daily by primary care. They feel his symptoms are likely due to hemorrhoid bleeding but are concerned about possible peptic ulcer disease.  No colonoscopy or endoscopy records found in our system.  Today he states his pain is mid to upper abdomen, which varies from sharp to cramping. Denies significant esophageal burning/bitter taste. His pain started about 3-4 weeks ago. Has had nausea as well, no vomiting. Protonix hasn't helped. Denies dysphagia symptoms. Has had a change in his bowel habits, going more frequently typically 4 times a day, softer than his baseline typically variable between [place] 4-6. Has had toilet tissue hematochezia with every bowel movement in the past 3-4 weeks. Has had black tarry stools for about 3 days last week, which has resolved. He admits a lightheaded-dizziness lately as well. Denies fever, chills, unintentional weight loss. Denies chest pain, dyspnea, lightheadedness, syncope, near syncope. Denies any other upper or lower GI symptoms.  Has been using Anusol suppository which has not helped his rectal bleeding. Was previously on Xarelto s/p left hip replacement which was stopped mid-August, 2017.   Takes intermittent/occasionl NSAIDs. Took an ASA powder 2 weeks ago, none otherwise.  Past Medical  History:  Diagnosis Date  . Arthritis   . Chronic back pain   . Chronic neck pain   . Chronic shoulder pain   . COPD (chronic obstructive pulmonary disease) ([place])   . GERD (gastroesophageal reflux disease)    tums  OTC  medication  . Headache   . Hypothyroidism     Past Surgical History:  Procedure Laterality Date  . NECK SURGERY    . SHOULDER SURGERY    . 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 ANUCORT-HC 25 MG suppository Place 1 suppository rectally daily.  0  . ENBREL SURECLICK 50 MG/ML injection once a week.   2  . levothyroxine (SYNTHROID, LEVOTHROID) 75 MCG tablet Take 75 mcg by mouth daily before breakfast.   4  . lidocaine (LIDODERM) 5 % Place 2 patches onto the skin daily. Remove & Discard patch after 12 hours or so a day 60 patch 5  . lisinopril (PRINIVIL,ZESTRIL) 10 MG tablet Take 10 mg by mouth daily.  0  . methocarbamol (ROBAXIN) 500 MG tablet Take 1 tablet (500 mg total) by mouth every 6 (six) hours as needed for muscle spasms. 40 tablet 0  . oxyCODONE-acetaminophen (PERCOCET) 10-325 MG tablet Take 1 tablet by mouth as needed.  0  . pantoprazole (PROTONIX) 40 MG tablet Take 40 mg by mouth daily.  0   No current facility-administered medications for  this visit.     Allergies as of 11/13/2015 - Review Complete 11/13/2015  Allergen Reaction Noted  . No known allergies  07/30/2015    Family History  Problem Relation Age of Onset  . Heart failure Mother   . Diabetes Mother   . Hypertension Mother   . Heart failure Father   . Prostate cancer Father   . Colon cancer Neg Hx     Social History   Social History  . Marital status: Married    Spouse name: N/A  . Number of children: N/A  . Years of education: N/A   Occupational History  . Not on file.    Social History Main Topics  . Smoking status: Former Smoker    Packs/day: 0.50    Types: Cigarettes    Quit date: 05/17/2015  . Smokeless tobacco: Never Used     Comment: USES VAPOR CIGARETTES   . Alcohol use 3.6 oz/week    6 Cans of beer per week     Comment: twice a week: 6-7 drinks per occurrance  . Drug use: No     Comment: states quit  . Sexual activity: Not on file   Other Topics Concern  . Not on file   Social History Narrative  . No narrative on file    Review of Systems: General: Negative for anorexia, weight loss, fever, chills, fatigue, weakness. ENT: Negative for hoarseness, difficulty swallowing. CV: Negative for chest pain, angina, palpitations, peripheral edema.  Respiratory: Negative for dyspnea at rest, cough, sputum, wheezing.  GI: See history of present illness. MS: Chronic joint pain.  Derm: Negative for rash or itching.   Endo: Negative for unusual weight change.  Heme: Negative for bruising or bleeding. Allergy: Negative for rash or hives.    Physical Exam: BP 126/69   Pulse 65   Temp 98.3 F (36.8 C) (Oral)   Ht 6' (1.829 m)   Wt 212 lb (96.2 kg)   BMI 28.75 kg/m  General:   Alert and oriented. Pleasant and cooperative. Well-nourished and well-developed.  Head:  Normocephalic and atraumatic. Eyes:  Without icterus, sclera clear and conjunctiva pink.  Ears:  Normal auditory acuity. Cardiovascular:  S1, S2 present without murmurs appreciated. Extremities without clubbing or edema. Respiratory:  Clear to auscultation bilaterally. No wheezes, rales, or rhonchi. No distress.  Gastrointestinal:  +BS, soft, and non-distended. Mild to moderate TTP epigastric/upper abdomen. No HSM noted. No guarding or rebound. No masses appreciated.  Rectal:  Deferred  Musculoskalatal:  Symmetrical without gross deformities. Skin:  Intact without significant lesions or rashes. Neurologic:  Alert and oriented x4;  grossly normal neurologically. Psych:  Alert and  cooperative. Normal mood and affect. Heme/Lymph/Immune: No excessive bruising noted.    11/13/2015 11:09 AM   Disclaimer: This note was dictated with voice recognition software. Similar sounding words can inadvertently be transcribed and may not be corrected upon review.

## 2015-12-09 NOTE — Op Note (Signed)
[facility] Patient Name: Charles [name] Procedure Date: 12/09/2015 9:36 AM MRN: [id number] Date of Birth: March 31, 1970 Attending MD: Norvel Richards , MD CSN: [id number] Age: 45 Admit Type: Outpatient Procedure:                Colonoscopy with multiple snare polypectomies Indications:              Hematochezia Providers:                Norvel Richards, MD, Hinton Rao, RN, [facility], Technician Referring MD:             Newt Minion, MD Medicines:                Propofol per Anesthesia Complications:            No immediate complications. Estimated Blood Loss:     Estimated blood loss was minimal. Procedure:                Pre-Anesthesia Assessment:                           - Prior to the procedure, a History and Physical                            was performed, and patient medications and                            allergies were reviewed. The patient's tolerance of                            previous anesthesia was also reviewed. The risks                            and benefits of the procedure and the sedation                            options and risks were discussed with the patient.                            All questions were answered, and informed consent                            was obtained. Prior Anticoagulants: The patient has                            taken no previous anticoagulant or antiplatelet                            agents. ASA Grade Assessment: II - A patient with                            mild systemic disease. After reviewing the risks  and benefits, the patient was deemed in                            satisfactory condition to undergo the procedure.                           After obtaining informed consent, the colonoscope                            was passed under direct vision. Throughout the                            procedure, the patient's blood pressure, pulse, and                        oxygen saturations were monitored continuously. The                            [id number] scope was introduced through                            the and advanced to the the cecum, identified by                            appendiceal orifice and ileocecal valve. The                            colonoscopy was performed without difficulty. The                            patient tolerated the procedure well. The quality                            of the bowel preparation was adequate. The                            ileocecal valve, appendiceal orifice, and rectum                            were photographed. Scope In: 9:41:01 AM Scope Out: 10:02:12 AM Scope Withdrawal Time: 0 hours 18 minutes 26 seconds  Total Procedure Duration: 0 hours 21 minutes 11 seconds  Findings:      The perianal and digital rectal examinations were normal.      Four semi-pedunculated polyps were found in the sigmoid colon and       descending colon. The polyps were 5 mm in size. These polyps were       removed with cold snare or hot snare techn. Resection and retrieval were       complete. Estimated blood loss was minimal.      The exam was otherwise without abnormality on direct and retroflexion       views. Impression:               - Four 5 mm polyps in the sigmoid colon and in the  descending colon, removed with a cold snare.                            Resected and retrieved.                           - The examination was otherwise normal on direct                            and retroflexion views. I suspect intermittent                            rectal bleeding could be due to the larger sigmoid                            poly removed or ano-rectal bleeding from benign                            source in the setting of binge beer consumption. Moderate Sedation:      Moderate (conscious) sedation was personally administered by an       anesthesia  professional. The following parameters were monitored: oxygen       saturation, heart rate, blood pressure, and response to care. Total       physician intraservice time was 40 minutes. Recommendation:           - Patient has a contact number available for                            emergencies. The signs and symptoms of potential                            delayed complications were discussed with the                            patient. Return to normal activities tomorrow.                            Written discharge instructions were provided to the                            patient.                           - Resume previous diet.                           - Continue present medications.                           - Repeat colonoscopy date to be determined after                            pending pathology results are reviewed for  surveillance.                           - Return to GI office in 3 months. See EGD report. Procedure Code(s):        --- Professional ---                           [id number], Colonoscopy, flexible; with removal of                            tumor(s), polyp(s), or other lesion(s) by snare                            technique Diagnosis Code(s):        --- Professional ---                           D12.5, Benign neoplasm of sigmoid colon                           D12.4, Benign neoplasm of descending colon                           K92.1, Melena (includes Hematochezia) CPT copyright 2016 American Medical Association. All rights reserved. The codes documented in this report are preliminary and upon coder review may  be revised to meet current compliance requirements. Cristopher Estimable. Tysheena Ginzburg, MD Norvel Richards, MD 12/09/2015 10:15:00 AM This report has been signed electronically. Number of Addenda: 0

## 2015-12-09 NOTE — Anesthesia Preprocedure Evaluation (Signed)
Anesthesia Evaluation  Patient identified by MRN, date of birth, ID band Patient awake    Reviewed: Allergy & Precautions, H&P , NPO status , Patient's Chart, lab work & pertinent test results  Airway Mallampati: II  TM Distance: >3 FB Neck ROM: full    Dental no notable dental hx.    Pulmonary COPD, former smoker,    Pulmonary exam normal breath sounds clear to auscultation       Cardiovascular Exercise Tolerance: Good  Rhythm:regular Rate:Normal     Neuro/Psych  Headaches,    GI/Hepatic GERD  ,(+)     substance abuse  alcohol use,   Endo/Other  Hypothyroidism   Renal/GU      Musculoskeletal   Abdominal   Peds  Hematology   Anesthesia Other Findings   Reproductive/Obstetrics                             Anesthesia Physical Anesthesia Plan  ASA: III  Anesthesia Plan: MAC   Post-op Pain Management:    Induction: Intravenous  Airway Management Planned: Simple Face Mask  Additional Equipment:   Intra-op Plan:   Post-operative Plan:   Informed Consent: I have reviewed the patients History and Physical, chart, labs and discussed the procedure including the risks, benefits and alternatives for the proposed anesthesia with the patient or authorized representative who has indicated his/her understanding and acceptance.     Plan Discussed with:   Anesthesia Plan Comments:         Anesthesia Quick Evaluation

## 2015-12-09 NOTE — Transfer of Care (Signed)
Immediate Anesthesia Transfer of Care Note  Patient: Charles [name]  Procedure(s) Performed: Procedure(s) with comments: COLONOSCOPY WITH PROPOFOL (N/A) - 1100 ESOPHAGOGASTRODUODENOSCOPY (EGD) WITH PROPOFOL (N/A)  Patient Location: PACU  Anesthesia Type:MAC  Level of Consciousness: patient cooperative  Airway & Oxygen Therapy: Patient Spontanous Breathing and Patient connected to nasal cannula oxygen  Post-op Assessment: Report given to RN and Post -op Vital signs reviewed and stable  Post vital signs: Reviewed and stable  Last Vitals:  Vitals:   12/09/15 0910 12/09/15 0915  BP: 135/84 132/82  Pulse:    Resp: 16 14  Temp:      Last Pain:  Vitals:   12/09/15 0835  TempSrc: Oral      Patients Stated Pain Goal: 7 ([id number])  Complications: No apparent anesthesia complications

## 2015-12-09 NOTE — Interval H&P Note (Signed)
History and Physical Interval Note:  12/09/2015 9:06 AM  Charles [name]  has presented today for surgery, with the diagnosis of BRBPR/DARK STOOLS/EPIG PAIN  The various methods of treatment have been discussed with the patient and family. After consideration of risks, benefits and other options for treatment, the patient has consented to  Procedure(s) with comments: COLONOSCOPY WITH PROPOFOL (N/A) - 1100 ESOPHAGOGASTRODUODENOSCOPY (EGD) WITH PROPOFOL (N/A) as a surgical intervention .  The patient's history has been reviewed, patient examined, no change in status, stable for surgery.  I have reviewed the patient's chart and labs.  Questions were answered to the patient's satisfaction.     Charles [name]  No change. No dysphagia. EGD and colonoscopy per plan.  The risks, benefits, limitations, imponderables and alternatives regarding both EGD and colonoscopy have been reviewed with the patient. Questions have been answered. All parties agreeable.

## 2015-12-12 ENCOUNTER — Encounter (HOSPITAL_COMMUNITY): Payer: Self-pay | Admitting: Internal Medicine

## 2015-12-17 ENCOUNTER — Telehealth: Payer: Self-pay | Admitting: Internal Medicine

## 2015-12-17 NOTE — Telephone Encounter (Signed)
Pt had procedure on 12/09/15. He was doing well until yesterday, he started having brbpr, several soft stools a day ( 4 so far today), nausea, sharp abd pain in his LLQ. No fever, no vomiting. Pt wants to know what he should do? He is aware that it will be tomorrow before I can get back with him and he said that was ok.

## 2015-12-17 NOTE — Telephone Encounter (Signed)
Pt called to say he had a colonoscopy recently and is still having problems. I transferred him the JL VM>.

## 2015-12-17 NOTE — Telephone Encounter (Signed)
Communication noted; he's a week out from TCS w multiple cold snare polypectomies.  Please call him in the am and see how he is doing.  Some (small amount) of bleeding  immediately following multiple cold snare polyp removals not surprising  --should rapidly taper off.

## 2015-12-18 ENCOUNTER — Encounter: Payer: Self-pay | Admitting: Internal Medicine

## 2015-12-18 NOTE — Telephone Encounter (Signed)
I called to check on pt. He said he is feeling some better.  Still having some nausea, no vomiting. Had one BM this Am and had a little bright red blood on tissue, not as much as yesterday. He still has the LLQ pain but it is worse when he eats and has to have a Bm almost immediately. Please advise!

## 2015-12-18 NOTE — Telephone Encounter (Signed)
Pt is aware to stay on clear liquids today. He will call if he gets fever or worsens.  He is aware that Dr. Gala Romney will be sending a letter with his results.

## 2015-12-18 NOTE — Telephone Encounter (Signed)
Lets have the patient back off to liquids today; will reassess tomorrow morning. No fever reported. 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 Nausea and bleeding have tapered off;  overall he is feeling better. He reported to [place] -  not severe at all per our nursing staff.  Check on him first day tomorrow morning.  I do not believe that we're hearing signs/symptoms of a significant procedure related complication.  As long as he continues to steadily improve, let's touch base with him in the morning.

## 2015-12-19 NOTE — Telephone Encounter (Signed)
Communication noted.  Thanks. 

## 2015-12-19 NOTE — Telephone Encounter (Signed)
I called to check on pt. He is doing OK he said. He was on clear liquids yesterday and no problems. Still no fever and no rectal bleeding today. I went over the results from his procedure from his letter. He is aware to call if he has any new rectal bleeding, abdominal pain or significant change in bowel habits. He will call as needed.

## 2015-12-26 ENCOUNTER — Ambulatory Visit (INDEPENDENT_AMBULATORY_CARE_PROVIDER_SITE_OTHER): Payer: BLUE CROSS/BLUE SHIELD | Admitting: Neurology

## 2015-12-26 ENCOUNTER — Encounter: Payer: Self-pay | Admitting: Neurology

## 2015-12-26 VITALS — BP 130/82 | HR 72 | Resp 14 | Ht 72.0 in | Wt 210.0 lb

## 2015-12-26 DIAGNOSIS — G4700 Insomnia, unspecified: Secondary | ICD-10-CM | POA: Insufficient documentation

## 2015-12-26 DIAGNOSIS — G5711 Meralgia paresthetica, right lower limb: Secondary | ICD-10-CM | POA: Diagnosis not present

## 2015-12-26 DIAGNOSIS — G5712 Meralgia paresthetica, left lower limb: Secondary | ICD-10-CM

## 2015-12-26 MED ORDER — AMITRIPTYLINE HCL 25 MG PO TABS: 25.0000 mg | ORAL_TABLET | Freq: Every day | ORAL | 5 refills | Status: DC

## 2015-12-26 MED ORDER — LIDOCAINE 5 % EX PTCH: 2.0000 | MEDICATED_PATCH | CUTANEOUS | 11 refills | Status: DC

## 2015-12-26 NOTE — Progress Notes (Signed)
[facility]  PATIENT: Charles [name] DOB: 1970/03/31  REFERRING DOCTOR OR PCP:  Dr. Estanislado Pandy   Fax [phone] SOURCE: patient, records from Dr. Estanislado Pandy.    NCV/EMG study, imaging reports  _________________________________   HISTORICAL  CHIEF COMPLAINT:  Chief Complaint  Patient presents with  . Numbness    Sts. he had left lateral femoral cutaneous nerve rf procedure with Dr. Lorrin Jackson helped some. He now c/o one golfball sized area of burning pain left lateral leg.  Sts. also now having numbness right lateral thigh.  Lidocaine patches help./fim    HISTORY OF PRESENT ILLNESS:  Charles [name] is a 45 yo man with a left LFCN neuropathy.   He had a LFCN RFA procedure by Dr. Maryjean Ka.  Numbness seems better and the pain improved though there is still a smaller region of burning pain in the left lateral thigh.   We had tried lamotrigine but he developed a rash and stopped.   Oxcarbazepine made him feel loopy and he stopped.  He still gets a good benefit form the lidoderm patches.     A few months ago, he started experiencing milder pain in the right anterolateral thigh associated with numbness.    He had had similar intermittent numbness in the same distribution a couple years ago.   The pain is not too bothersome at this point.  Insomnia:   He notes some difficulty staying asleep hours most nights.  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   His psoriatic arthritis is being treated with Enbrel. He sees Dr. Estanislado Pandy.  H/O LFCN:   In 2015, without any preceding event, he had the onset of numbness that increased over a few weeks in the anterolateral left thigh. Over the next few months, the numbness began to resolve but was replaced with a burning pain. Additionally, he had allodynia and when the skin is touched, rather than filling touch, he feels burning pain. There was no benefit from  gabapentin and Keppra.    He had a NCV/EMG by Dr. Ernestina Patches performed to 03/25/2015. The nerve conduction studies and EMG were normal. The nerves tested did not include the lateral femoral cutaneous nerve.   More recently, lamotrigine was tried (but caused rash) and oxcarbazepine was tried (poorly tolerated - cognitive effects).   Lidoderm has been helpful and well tolerated..   Left RFA has helped   REVIEW OF SYSTEMS: Constitutional: No fevers, chills, sweats, or change in appetite Eyes: No visual changes, double vision, eye pain Ear, nose and throat: No hearing loss, ear pain, nasal congestion, sore throat Cardiovascular: No chest pain, palpitations Respiratory: No shortness of breath at rest or with exertion.   No wheezes GastrointestinaI: No nausea, vomiting, diarrhea, abdominal pain, fecal incontinence Genitourinary: No dysuria, urinary retention or frequency.  No nocturia. Musculoskeletal: No neck pain, back pain Integumentary: No rash, pruritus, skin lesions Neurological: as above Psychiatric: No depression at this time.  No anxiety Endocrine: No palpitations, diaphoresis, change in appetite, change in weigh or increased thirst Hematologic/Lymphatic: No anemia, purpura, petechiae. Allergic/Immunologic: No itchy/runny eyes, nasal congestion, recent allergic reactions, rashes  ALLERGIES: No Known Allergies  HOME MEDICATIONS:  Current Outpatient Prescriptions:  .  ENBREL SURECLICK 50 MG/ML injection, Inject 50 mg into the skin once a week. , Disp: , Rfl: 2 .  levothyroxine (SYNTHROID, LEVOTHROID) 75 MCG tablet, Take 75 mcg by mouth daily before breakfast. , Disp: ,  Rfl: 4 .  lidocaine (LIDODERM) 5 %, Place 2 patches onto the skin daily. Remove & Discard patch after 12 hours or so a day, Disp: 60 patch, Rfl: 11 .  lisinopril (PRINIVIL,ZESTRIL) 10 MG tablet, Take 10 mg by mouth daily., Disp: , Rfl: 0 .  oxyCODONE-acetaminophen (PERCOCET) 10-325 MG tablet, Take 1 tablet by mouth 2 (two)  times daily as needed for pain. , Disp: , Rfl: 0 .  pantoprazole (PROTONIX) 40 MG tablet, Take 40 mg by mouth daily., Disp: , Rfl: 0 .  amitriptyline (ELAVIL) 25 MG tablet, Take 1 tablet (25 mg total) by mouth at bedtime., Disp: 30 tablet, Rfl: 5  PAST MEDICAL HISTORY: Past Medical History:  Diagnosis Date  . Arthritis   . Chronic back pain   . Chronic neck pain   . Chronic shoulder pain   . COPD (chronic obstructive pulmonary disease) ([place])   . GERD (gastroesophageal reflux disease)    tums  OTC  medication  . Headache   . Hypothyroidism     PAST SURGICAL HISTORY: Past Surgical History:  Procedure Laterality Date  . COLONOSCOPY WITH PROPOFOL N/A 12/09/2015   Procedure: COLONOSCOPY WITH PROPOFOL;  Surgeon: Daneil Dolin, MD;  Location: [facility];  Service: Endoscopy;  Laterality: N/A;  1100  . ESOPHAGOGASTRODUODENOSCOPY (EGD) WITH PROPOFOL N/A 12/09/2015   Procedure: ESOPHAGOGASTRODUODENOSCOPY (EGD) WITH PROPOFOL;  Surgeon: Daneil Dolin, MD;  Location: [facility];  Service: Endoscopy;  Laterality: N/A;  . NECK SURGERY    . SHOULDER SURGERY    . TOTAL HIP ARTHROPLASTY Left 07/30/2015   Procedure: TOTAL HIP ARTHROPLASTY;  Surgeon: Garald Balding, MD;  Location: [facility];  Service: Orthopedics;  Laterality: Left;    FAMILY HISTORY: Family History  Problem Relation Age of Onset  . Heart failure Mother   . Diabetes Mother   . Hypertension Mother   . Heart failure Father   . Prostate cancer Father   . Colon cancer Neg Hx     SOCIAL HISTORY:  Social History   Social History  . Marital status: Married    Spouse name: N/A  . Number of children: N/A  . Years of education: N/A   Occupational History  . Not on file.   Social History Main Topics  . Smoking status: Former Smoker    Packs/day: 0.50    Types: Cigarettes    Quit date: 05/17/2015  . Smokeless tobacco: Never Used     Comment: USES VAPOR CIGARETTES   . Alcohol use 3.6 oz/week    6 Cans of beer per  week     Comment: twice a week: 6-7 drinks per occurrance  . Drug use: No     Comment: states quit  . Sexual activity: Not on file   Other Topics Concern  . Not on file   Social History Narrative  . No narrative on file     PHYSICAL EXAM  Vitals:   12/26/15 0828  BP: 130/82  Pulse: 72  Resp: 14  Weight: 210 lb (95.3 kg)  Height: 6' (1.829 m)    Body mass index is 28.48 kg/m.   General: The patient is well-developed and well-nourished and in no acute distress  Neurologic Exam  Mental status: The patient is alert and oriented x 3 at the time of the examination. The patient has apparent normal recent and remote memory, with an apparently normal attention span and concentration ability.   Speech is normal.  Cranial nerves: Extraocular movements  are full.  Facial strength is normal.   No obvious hearing deficits are noted.  Motor:  Muscle bulk is normal.   Tone is normal. Strength is  5 / 5 in all 4 extremities.   Sensory:  He has numbness in the distribution of the both lateral femoral cutaneous nerves.   There is a patch of allodynia in the more lateral left LFCN distribution closer to the knee.  ..  Coordination: Cerebellar testing reveals good finger-nose-finger  Gait and station: Station is normal.   Gait is arthritic.        DIAGNOSTIC DATA (LABS, IMAGING, TESTING) - I reviewed patient records, labs, notes, testing and imaging myself where available.  Lab Results  Component Value Date   WBC 7.0 11/13/2015   HGB 15.5 11/13/2015   HCT 46.8 11/13/2015   MCV 93.2 11/13/2015   PLT 212 11/13/2015      Component Value Date/Time   NA 138 11/13/2015 1157   K 4.8 11/13/2015 1157   CL 102 11/13/2015 1157   CO2 26 11/13/2015 1157   GLUCOSE 90 11/13/2015 1157   BUN 15 11/13/2015 1157   CREATININE 0.97 11/13/2015 1157   CALCIUM 9.8 11/13/2015 1157   PROT 6.9 11/13/2015 1157   ALBUMIN 4.7 11/13/2015 1157   AST 14 11/13/2015 1157   ALT 13 11/13/2015 1157    ALKPHOS 47 11/13/2015 1157   BILITOT 1.0 11/13/2015 1157   GFRNONAA >60 08/01/2015 0451   GFRAA >60 08/01/2015 0451    Lab Results  Component Value Date   TSH 3.306 04/27/2014       ASSESSMENT AND PLAN  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  Continue prn lidoderm patches. 2.   Trial of amitriptyline for the dysesthesias and sleep. 3.   RTC prn increased pain or new symptoms   Richard A. Felecia Shelling, MD, PhD [id number], [id number] AM Certified in Neurology, Clinical Neurophysiology, Sleep Medicine, Pain Medicine and Neuroimaging  [facility] [address] [place], [place] [address] [phone]

## 2016-01-09 ENCOUNTER — Other Ambulatory Visit: Payer: Self-pay | Admitting: *Deleted

## 2016-01-09 MED ORDER — ENBREL SURECLICK 50 MG/ML ~~LOC~~ SOAJ: 50.0000 mg | SUBCUTANEOUS | 0 refills | Status: DC

## 2016-01-09 NOTE — Telephone Encounter (Signed)
Refill request received via fax for Enbrel  Last Visit: 09/13/15 Next Visit: 01/27/16 Labs: 11/13/15 WNL TB Gold: 03/22/15 Neg  Okay to refill Enbrel?

## 2016-01-09 NOTE — Telephone Encounter (Signed)
ok 

## 2016-01-14 ENCOUNTER — Ambulatory Visit: Payer: BLUE CROSS/BLUE SHIELD | Admitting: Nurse Practitioner

## 2016-01-21 ENCOUNTER — Telehealth: Payer: Self-pay | Admitting: Rheumatology

## 2016-01-21 NOTE — Telephone Encounter (Signed)
Patient is out of Enbrel and has been for about 2 or 3 weeks. He was told to call and could possibly get a sample until his came in. Please let patient know.

## 2016-01-21 NOTE — Telephone Encounter (Signed)
Patient states that the pharmacy states they need a new authorization. Patient states he has also had a change in insurances at the first of the year. Patient advised that we currently do not have any of the Enbrel Sure[facility] samples but do have Enbrel syringes that we could give to him. Patient states he will find out his new insurance information and call back so we can start a prior authorization for the Enbrel .

## 2016-01-21 NOTE — Telephone Encounter (Signed)
I called [facility] at [phone] and spoke to [place] in the insurance department regarding patient's prescription.  Larena Glassman reports she sees where patient called on 01/15/16 to request an Enbrel refill.  She reports patient's insurance does not need a prior authorization and patient's copay is $10.  She tried to transfer me to the pharmacy to find out when patient should expect his medication to arrive.  I waited on hold for an extended period of time but was never able to speak to anyone in the pharmacy.    I called patient to update him.  Patient reports he will try to call the pharmacy to find out when the medication will be delivered.  Advised patient to call us back tomorrow if he continues to have trouble getting his prescription filled.     Elisabeth Most, Pharm.D., BCPS, CPP Clinical Pharmacist Pager: [phone] Phone: [phone] 01/21/2016 5:33 PM

## 2016-01-27 ENCOUNTER — Ambulatory Visit: Payer: BLUE CROSS/BLUE SHIELD | Admitting: Rheumatology

## 2016-02-04 ENCOUNTER — Encounter: Payer: Self-pay | Admitting: Rheumatology

## 2016-02-04 ENCOUNTER — Ambulatory Visit (INDEPENDENT_AMBULATORY_CARE_PROVIDER_SITE_OTHER): Payer: 59 | Admitting: Rheumatology

## 2016-02-04 VITALS — BP 127/66 | HR 86 | Resp 13 | Ht 72.0 in | Wt 214.0 lb

## 2016-02-04 DIAGNOSIS — Z79899 Other long term (current) drug therapy: Secondary | ICD-10-CM | POA: Diagnosis not present

## 2016-02-04 DIAGNOSIS — M4726 Other spondylosis with radiculopathy, lumbar region: Secondary | ICD-10-CM

## 2016-02-04 DIAGNOSIS — L408 Other psoriasis: Secondary | ICD-10-CM | POA: Diagnosis not present

## 2016-02-04 DIAGNOSIS — M47812 Spondylosis without myelopathy or radiculopathy, cervical region: Secondary | ICD-10-CM

## 2016-02-04 DIAGNOSIS — M5030 Other cervical disc degeneration, unspecified cervical region: Secondary | ICD-10-CM

## 2016-02-04 DIAGNOSIS — M461 Sacroiliitis, not elsewhere classified: Secondary | ICD-10-CM | POA: Diagnosis not present

## 2016-02-04 LAB — COMPLETE METABOLIC PANEL WITH GFR
ALBUMIN: 4.7 g/dL (ref 3.6–5.1)
ALK PHOS: 43 U/L (ref 40–115)
ALT: 13 U/L (ref 9–46)
AST: 15 U/L (ref 10–40)
BILIRUBIN TOTAL: 0.7 mg/dL (ref 0.2–1.2)
BUN: 20 mg/dL (ref 7–25)
CALCIUM: 10.1 mg/dL (ref 8.6–10.3)
CO2: 28 mmol/L (ref 20–31)
CREATININE: 1.51 mg/dL — AB (ref 0.60–1.35)
Chloride: 105 mmol/L (ref 98–110)
GFR, Est African American: 64 mL/min (ref >=60)
GFR, Est Non African American: 56 mL/min — ABNORMAL LOW (ref >=60)
Glucose, Bld: 79 mg/dL (ref 65–99)
Potassium: 4.9 mmol/L (ref 3.5–5.3)
Sodium: 141 mmol/L (ref 135–146)
Total Protein: 7.3 g/dL (ref 6.1–8.1)

## 2016-02-04 LAB — CBC WITH DIFFERENTIAL/PLATELET
BASOS PCT: 0 %
Basophils Absolute: 0 cells/uL (ref 0–200)
Eosinophils Absolute: 86 cells/uL (ref 15–500)
Eosinophils Relative: 1 %
HEMATOCRIT: 44.2 % (ref 38.5–50.0)
HEMOGLOBIN: 15.1 g/dL (ref 13.2–17.1)
LYMPHS ABS: 2666 {cells}/uL (ref 850–3900)
Lymphocytes Relative: 31 %
MCH: 31.6 pg (ref 27.0–33.0)
MCHC: 34.2 g/dL (ref 32.0–36.0)
MCV: 92.5 fL (ref 80.0–100.0)
MONO ABS: 688 {cells}/uL (ref 200–950)
MPV: 12.2 fL (ref 7.5–12.5)
Monocytes Relative: 8 %
NEUTROS PCT: 60 %
Neutro Abs: 5160 cells/uL (ref 1500–7800)
Platelets: 191 10*3/uL (ref 140–400)
RBC: 4.78 MIL/uL (ref 4.20–5.80)
RDW: 14 % (ref 11.0–15.0)
WBC: 8.6 10*3/uL (ref 3.8–10.8)

## 2016-02-04 MED ORDER — ETANERCEPT 50 MG/ML ~~LOC~~ SOAJ: 50.0000 mg | SUBCUTANEOUS | 0 refills | Status: DC

## 2016-02-04 MED ORDER — ENBREL SURECLICK 50 MG/ML ~~LOC~~ SOAJ: 50.0000 mg | SUBCUTANEOUS | 0 refills | Status: DC

## 2016-02-04 NOTE — Progress Notes (Signed)
Office Visit Note  Patient: Charles [name]             Date of Birth: [birth year]           MRN: [id number]             PCP: Robert Bellow, MD Referring: Lemmie Evens, MD Visit Date: 02/04/2016 Occupation: '@GUAROCC'$ @    Subjective:  No chief complaint on file. Follow-up on Sacroiliitis and Enbrel.  History of Present Illness: Charles [name] is a 46 y.o. male  Last seen 09/13/2015. And has a history of sacroiliitis bilaterally. He has sacroiliac joint involvement based on MRI. He had been doing well with Enbrel but he notes that he is not doing as well lately. He does state that he took his last Enbrel shot last Tuesday but the 2 injection dates prior to that he missed the medication. He also notes that when he has missed Enbrel injections in the past his back would flareup to the point where he couldn't even get out of bed.  Uncertain at this time whether his back pain is due to the missing of his medication recently (2 doses) or is the Enbrel ineffective. We discussed at length with the patient the importance of separating about whether the medication is or is not working to be sure before we make any changes. He is agreeable. He wants to try Enbrel every week for the next 3 months and we'll have a discussion at month for and he will have a better assessment for [place] at that time.  Patient also is changing his insurance currently. As a result we may have trouble getting Enbrel until his insurance and paperwork are all processed properly. As a result of give him a injection of Enbrel today (2 every Tuesday) so he'll have enough supply at home in case there is a delay in getting him the Enbrel refills when they're due.   Activities of Daily Living:  Patient reports morning stiffness for 30 minutes to minutes.   Patient Denies nocturnal pain unless patient misses doses of Enbrel. Then he has Difficulty dressing/grooming: Denies Difficulty climbing stairs: Denies Difficulty  getting out of chair: Denies Difficulty using hands for taps, buttons, cutlery, and/or writing: Denies   Review of Systems  Constitutional: Negative for fatigue.  HENT: Negative for mouth sores and mouth dryness.   Eyes: Negative for dryness.  Respiratory: Negative for shortness of breath.   Gastrointestinal: Negative for constipation and diarrhea.  Musculoskeletal: Negative for myalgias and myalgias.  Skin: Negative for sensitivity to sunlight.  Neurological: Negative for memory loss.  Psychiatric/Behavioral: Negative for sleep disturbance.    PMFS History:  Patient Active Problem List   Diagnosis Date Noted  . 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 Insomnia 12/26/2015  . Abdominal pain, epigastric 11/13/2015  . Dark stools 11/13/2015  . Rectal bleeding 11/13/2015  . Numbness 09/26/2015  . 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 Avascular necrosis of left femoral head ([place]) 07/30/2015  . Avascular necrosis of right femoral head ([place]) 07/30/2015  . Psoriasis 07/30/2015  . Chronic obstructive pulmonary disease (COPD) ([place]) 07/30/2015  . 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 Arthritis   . Chronic back pain   . Chronic neck pain   . Chronic shoulder pain   . COPD (chronic obstructive pulmonary disease) ([place])   . GERD (gastroesophageal reflux disease)  tums  OTC  medication  . Headache   . Hypothyroidism     Family History  Problem Relation Age of Onset  . Heart failure Mother   . Diabetes Mother   . Hypertension Mother   . Heart failure Father   . Prostate cancer Father   . Colon cancer Neg Hx    Past Surgical History:  Procedure Laterality Date  . COLONOSCOPY WITH PROPOFOL N/A 12/09/2015   Procedure: COLONOSCOPY WITH PROPOFOL;  Surgeon: Daneil Dolin, MD;  [facility];  Service: Endoscopy;  Laterality: N/A;  1100  .  ESOPHAGOGASTRODUODENOSCOPY (EGD) WITH PROPOFOL N/A 12/09/2015   Procedure: ESOPHAGOGASTRODUODENOSCOPY (EGD) WITH PROPOFOL;  Surgeon: Daneil Dolin, MD;  [facility];  Service: Endoscopy;  Laterality: N/A;  . NECK SURGERY    . SHOULDER SURGERY    . TOTAL HIP ARTHROPLASTY Left 07/30/2015   Procedure: TOTAL HIP ARTHROPLASTY;  Surgeon: Garald Balding, MD;  Location: [place];  Service: Orthopedics;  Laterality: Left;   Social History   Social History Narrative  . No narrative on file     Objective: Vital Signs: BP 127/66 (BP Location: Left Arm, Patient Position: Sitting, Cuff Size: Normal)   Pulse 86   Resp 13   Ht 6' (1.829 m)   Wt 214 lb (97.1 kg)   BMI 29.02 kg/m    Physical Exam  Constitutional: He is oriented to person, place, and time. He appears well-developed and well-nourished.  HENT:  Head: Normocephalic and atraumatic.  Eyes: Conjunctivae and EOM are normal. Pupils are equal, round, and reactive to light.  Neck: Normal range of motion. Neck supple.  Cardiovascular: Normal rate, regular rhythm and normal heart sounds.  Exam reveals no gallop and no friction rub.   No murmur heard. Pulmonary/Chest: Effort normal and breath sounds normal. No respiratory distress. He has no wheezes. He has no rales. He exhibits no tenderness.  Abdominal: Soft. He exhibits no distension and no mass. There is no tenderness. There is no guarding.  Musculoskeletal: Normal range of motion.  Lymphadenopathy:    He has no cervical adenopathy.  Neurological: He is alert and oriented to person, place, and time. He exhibits normal muscle tone. Coordination normal.  Skin: Skin is warm and dry. Capillary refill takes less than 2 seconds. No rash noted.  Psychiatric: He has a normal mood and affect. His behavior is normal. Judgment and thought content normal.  Nursing note and vitals reviewed.    Musculoskeletal Exam:  Full range of motion of all joints Patient is able to touch his toes  but is able to flex only up until [ 27cm (finger from floor)] Fiber myalgia tender points are all absent  CDAI Exam: No CDAI exam completed.  Synovitis on examination  Investigation: No additional findings.  Summary of neurology referral to Dr. Arlice Colt at [facility] neuro on 09/26/2015 Chief complaint: Numbness with allodynia in the distribution of left lateral femoral cutaneous nerve. Symptoms time over a year. 2 persistence of symptoms, he may have dysesthesias long-term. Neurologist gave him prescription for Lidoderm patches to wear daily and lamotrigine, 100 mg twice a day. The lamotrigine may be titrated. Neurologist also notes that he may have to consider changing the treatment plan to try Trileptal or try cyclic if the above treatment is ineffective. Patient has follow-up in 3 months with neurologist.  Office Visit on 11/13/2015  Component Date Value Ref Range Status  . WBC 11/13/2015 7.0  3.8 - 10.8 K/uL  Final  . RBC 11/13/2015 5.02  4.20 - 5.80 MIL/uL Final  . Hemoglobin 11/13/2015 15.5  13.2 - 17.1 g/dL Final  . HCT 11/13/2015 46.8  38.5 - 50.0 % Final  . MCV 11/13/2015 93.2  80.0 - 100.0 fL Final  . MCH 11/13/2015 30.9  27.0 - 33.0 pg Final  . MCHC 11/13/2015 33.1  32.0 - 36.0 g/dL Final  . RDW 11/13/2015 13.9  11.0 - 15.0 % Final  . Platelets 11/13/2015 212  140 - 400 K/uL Final  . MPV 11/13/2015 11.8  7.5 - 12.5 fL Final  . Neutro Abs 11/13/2015 4200  1,500 - 7,800 cells/uL Final  . Lymphs Abs 11/13/2015 2170  850 - 3,900 cells/uL Final  . Monocytes Absolute 11/13/2015 560  200 - 950 cells/uL Final  . Eosinophils Absolute 11/13/2015 70  15 - 500 cells/uL Final  . Basophils Absolute 11/13/2015 0  0 - 200 cells/uL Final  . Neutrophils Relative % 11/13/2015 60  % Final  . Lymphocytes Relative 11/13/2015 31  % Final  . Monocytes Relative 11/13/2015 8  % Final  . Eosinophils Relative 11/13/2015 1  % Final  . Basophils Relative 11/13/2015 0  % Final  . Smear Review  11/13/2015 Criteria for review not met   Final  . Sodium 11/13/2015 138  135 - 146 mmol/L Final  . Potassium 11/13/2015 4.8  3.5 - 5.3 mmol/L Final  . Chloride 11/13/2015 102  98 - 110 mmol/L Final  . CO2 11/13/2015 26  20 - 31 mmol/L Final  . Glucose, Bld 11/13/2015 90  65 - 99 mg/dL Final  . BUN 11/13/2015 15  7 - 25 mg/dL Final  . Creat 11/13/2015 0.97  0.60 - 1.35 mg/dL Final  . Total Bilirubin 11/13/2015 1.0  0.2 - 1.2 mg/dL Final  . Alkaline Phosphatase 11/13/2015 47  40 - 115 U/L Final  . AST 11/13/2015 14  10 - 40 U/L Final  . ALT 11/13/2015 13  9 - 46 U/L Final  . Total Protein 11/13/2015 6.9  6.1 - 8.1 g/dL Final  . Albumin 11/13/2015 4.7  3.6 - 5.1 g/dL Final  . Calcium 11/13/2015 9.8  8.6 - 10.3 mg/dL Final  . Lipase 11/13/2015 23  7 - 60 U/L Final     Imaging: No results found.  Speciality Comments: No specialty comments available.    Procedures:  No procedures performed Allergies: Patient has no known allergies.   Assessment / Plan:     Visit Diagnoses: Bilateral sacroiliitis ([place])  Other psoriasis  High risk medications (not anticoagulants) long-term use - 02/04/2016:==>enbrel q wk most of the time;(? inadeq response);  DJD (degenerative joint disease), cervical  Osteoarthritis of spine with radiculopathy, lumbar region - 02/04/2016: Was sent to [facility] for left lower extremity neuropathy noted on 09/13/2015 visit.   On September 2017 visit with Dr. Estanislado Pandy, she referred the patient to neurologist for left lower extremity numbness. Please see notes in investigation regarding his visit with Dr. Felecia Shelling, [facility] neurology  Today, CBC with differential, CMP with GFR, TB gold  Enbrel sure click to go.  Return to clinic in 4 months. I've advised the patient to keep a calendar of his pain once a week. He will assess Monday through Saturday what his pain has been like on a 0-10 scale; 0's no pain tenderness severe pain. If he misses any Enbrel, and his  pain goes up subsequently, we will know that the medicine is working well but it's because he  is missing the medications that causing his back pain. If he has flares that are considered high while on Enbrel and not missing a dose that he may have Enbrel failure. Patient understands and is agreeable and we will discuss how well he is doing by him bringing in a calendar so that we would no if we need to switch medications or not in the future  Patient is seeing [facility] neurology for left lower extremity neuropathy. He also received a cortisone injection there which is given him partial relief. It lasted for short period of time and he may need to return to get follow-up treatment if indicated.  Orders: No orders of the defined types were placed in this encounter.  No orders of the defined types were placed in this encounter.   Face-to-face time spent with patient was 30 minutes. 50% of time was spent in counseling and coordination of care.  Follow-Up Instructions: Return in about 4 months (around 06/03/2016).   Eliezer Lofts, PA-C  Note - This record has been created using [employer].  Chart creation errors have been sought, but may not always  have been located. Such creation errors do not reflect on  the standard of medical care.

## 2016-02-04 NOTE — Progress Notes (Signed)
Rheumatology Medication Review by a Pharmacist Does the patient feel that his/her medications are working for him/her?  Yes reports he does not feel like it is working as well as it used to.   Has the patient been experiencing any side effects to the medications prescribed?  No Does the patient have any problems obtaining medications?  No  Issues to address at subsequent visits: None   Pharmacist comments:  Charles [name] is a pleasant 46 yo M who presents for follow up of sacroiliitis.  He is currently taking Enbrel 50 mg every week.  Patient had standing labs on 11/13/15 which were [name].  He will be due for standing labs again next week.  Most recent TB Gold was on 03/07/15 which was negative.  He will be due for TB Gold again in March 2018.   Patient reports he has had difficulty getting his Enbrel from the pharmacy.  Medication Samples have been provided to the patient.  Drug name: Enbrel, Strength: 50 mg, Qty: 1, LOT: [id number], Exp.Date: 1/20, Dosing instructions: Inject under the skin once a week.  The patient has been instructed regarding the correct time, dose, and frequency of taking this medication, including desired effects and most common side effects.   Discussed other specialty pharmacy locations.  Patient denies any other questions regarding his medications at this time.    Elisabeth Most, Pharm.D., BCPS, CPP Clinical Pharmacist Pager: [phone] Phone: [phone] 02/04/2016 3:25 PM

## 2016-02-07 ENCOUNTER — Telehealth: Payer: Self-pay

## 2016-02-07 LAB — QUANTIFERON TB GOLD ASSAY (BLOOD)
INTERFERON GAMMA RELEASE ASSAY: NEGATIVE
Mitogen-Nil: 7.36 IU/mL
Quantiferon Nil Value: 0.02 IU/mL
Quantiferon Tb Ag Minus Nil Value: 0.01 IU/mL

## 2016-02-07 NOTE — Progress Notes (Addendum)
Eliezer Lofts, PA-C  [place], CPhT         Patient has axial disease.   Previous Messages    ----- Message -----  From: Osvaldo Human, CPhT  Sent: 02/05/2016  8:48 AM  To: Eliezer Lofts, PA-C  Subject: PsA Diagnoses                   I am trying to submit a prior authorization for patients Enbrel. 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 Is this correct? If so, is it axial or non-axial?   Thanks,  Tylia Ewell       Confirmed patient's diagnosis with Mr Carlyon Shadow and submitted a prior authorization through CoverMyMeds for patients Enrel. Will update any new information received.   Jeiry Birnbaum, [place], CPhT

## 2016-02-07 NOTE — Telephone Encounter (Signed)
Received a fax from [facility] regarding a prior authorization approval for ENBREL from 02/07/16 to 08/06/16.   Reference number:[id number] ED Phone number:[phone]  Will scan document into epic.  Ran a test claim in RX30 and found his copay to be over $4000. Called [employer] to verify why it was so expensive. Spoke with Delilah Shan who states that patient has a $4000 deductible.   Referene number: [id number]  Spoke with Mr. Puzon to update him on the approval and the copay. He did not know that he had a deductible. I informed him about the Enbrel Support Copay/Coupon program. He thinks that he is already enrolled. He also state that they should have sent him copay card in the mail but he did not receive one yet. I told him to call [phone] to speak to someone for the support program to get the details on his card and if he was still enrolled. He will call us back with the results.     Yamilett Anastos, [place], CPhT   4:00 PM

## 2016-02-07 NOTE — Progress Notes (Signed)
Tell patient and send copy to PCP#1: CMP with GFR is within normal limits except for moderate elevation of creatinine at 1.51 and slight decrease in GFR 56.) We will monitor).  #2: CBC with differential is within normal limits#3: TB gold is negative

## 2016-02-12 ENCOUNTER — Telehealth: Payer: Self-pay

## 2016-02-12 NOTE — Telephone Encounter (Signed)
Called patient to see if he had any updates on his enbrel co-pay card. Noted that patient had not filled his rx for Enbrel yet at [facility]. Left message for patient to call back.   Dakota Stangl, [place], CPhT 3:15 PM

## 2016-02-17 NOTE — Telephone Encounter (Signed)
Spoke with patient who states that he has not yet contacted Enbrel. He plans to give them a call today to set up his coupon to use at the pharmacy. He also states that he has about 2-3 pens at home and shouldn't need a refill unit. The end of the month/early March. He denied any questions at this time.  Sherril Heyward, [place], CPhT 9:32 AM

## 2016-02-25 ENCOUNTER — Telehealth: Payer: Self-pay | Admitting: Rheumatology

## 2016-02-25 MED FILL — ENBREL 50 MG/ML SURECLICK S: 50 | 28 days supply | Qty: 4 | Fill #0

## 2016-02-25 NOTE — Telephone Encounter (Signed)
Patient advised his Enbrel prescription was sent to the [facility]. Patient has been provided with the phone number and will contact them.

## 2016-02-25 NOTE — Telephone Encounter (Signed)
Patient called wanting to know if he needs to call the pharmacy at [facility] or [facility] to get his Enbrel. Cb#[phone].

## 2016-03-09 ENCOUNTER — Encounter: Payer: Self-pay | Admitting: Internal Medicine

## 2016-03-13 ENCOUNTER — Encounter: Payer: Self-pay | Admitting: Nurse Practitioner

## 2016-03-13 ENCOUNTER — Ambulatory Visit (INDEPENDENT_AMBULATORY_CARE_PROVIDER_SITE_OTHER): Payer: 59 | Admitting: Nurse Practitioner

## 2016-03-13 VITALS — BP 136/76 | HR 78 | Temp 98.3°F | Ht 72.0 in | Wt 208.2 lb

## 2016-03-13 DIAGNOSIS — K649 Unspecified hemorrhoids: Secondary | ICD-10-CM

## 2016-03-13 DIAGNOSIS — K625 Hemorrhage of anus and rectum: Secondary | ICD-10-CM | POA: Diagnosis not present

## 2016-03-13 IMAGING — DX DG CHEST 2V
2 series · 2 of 2 positions shown · non-contrast
Comparison: 03/16/2014 chest radiograph.

CLINICAL DATA: Chest pain

EXAM:
CHEST  2 VIEW

[chest pa]
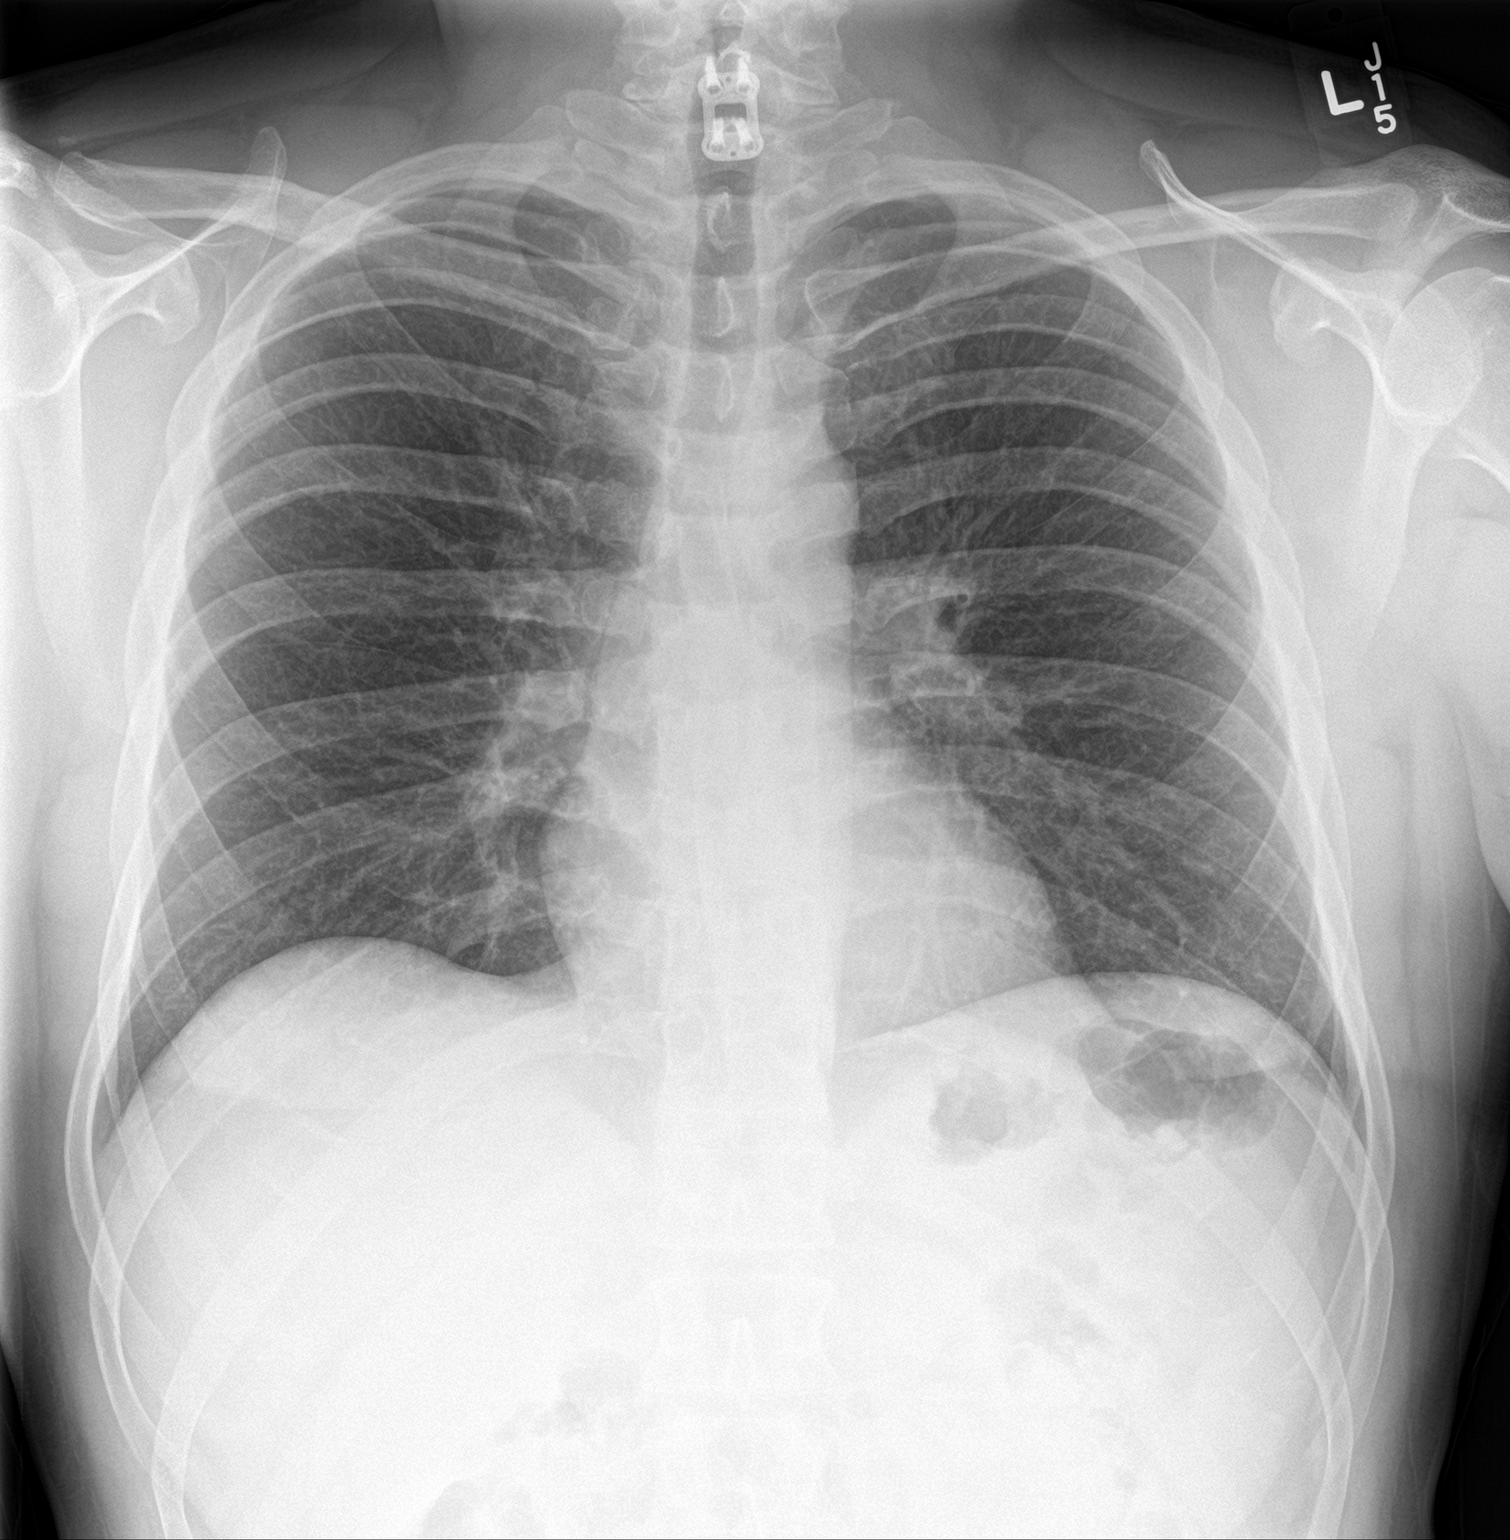

[chest lat]
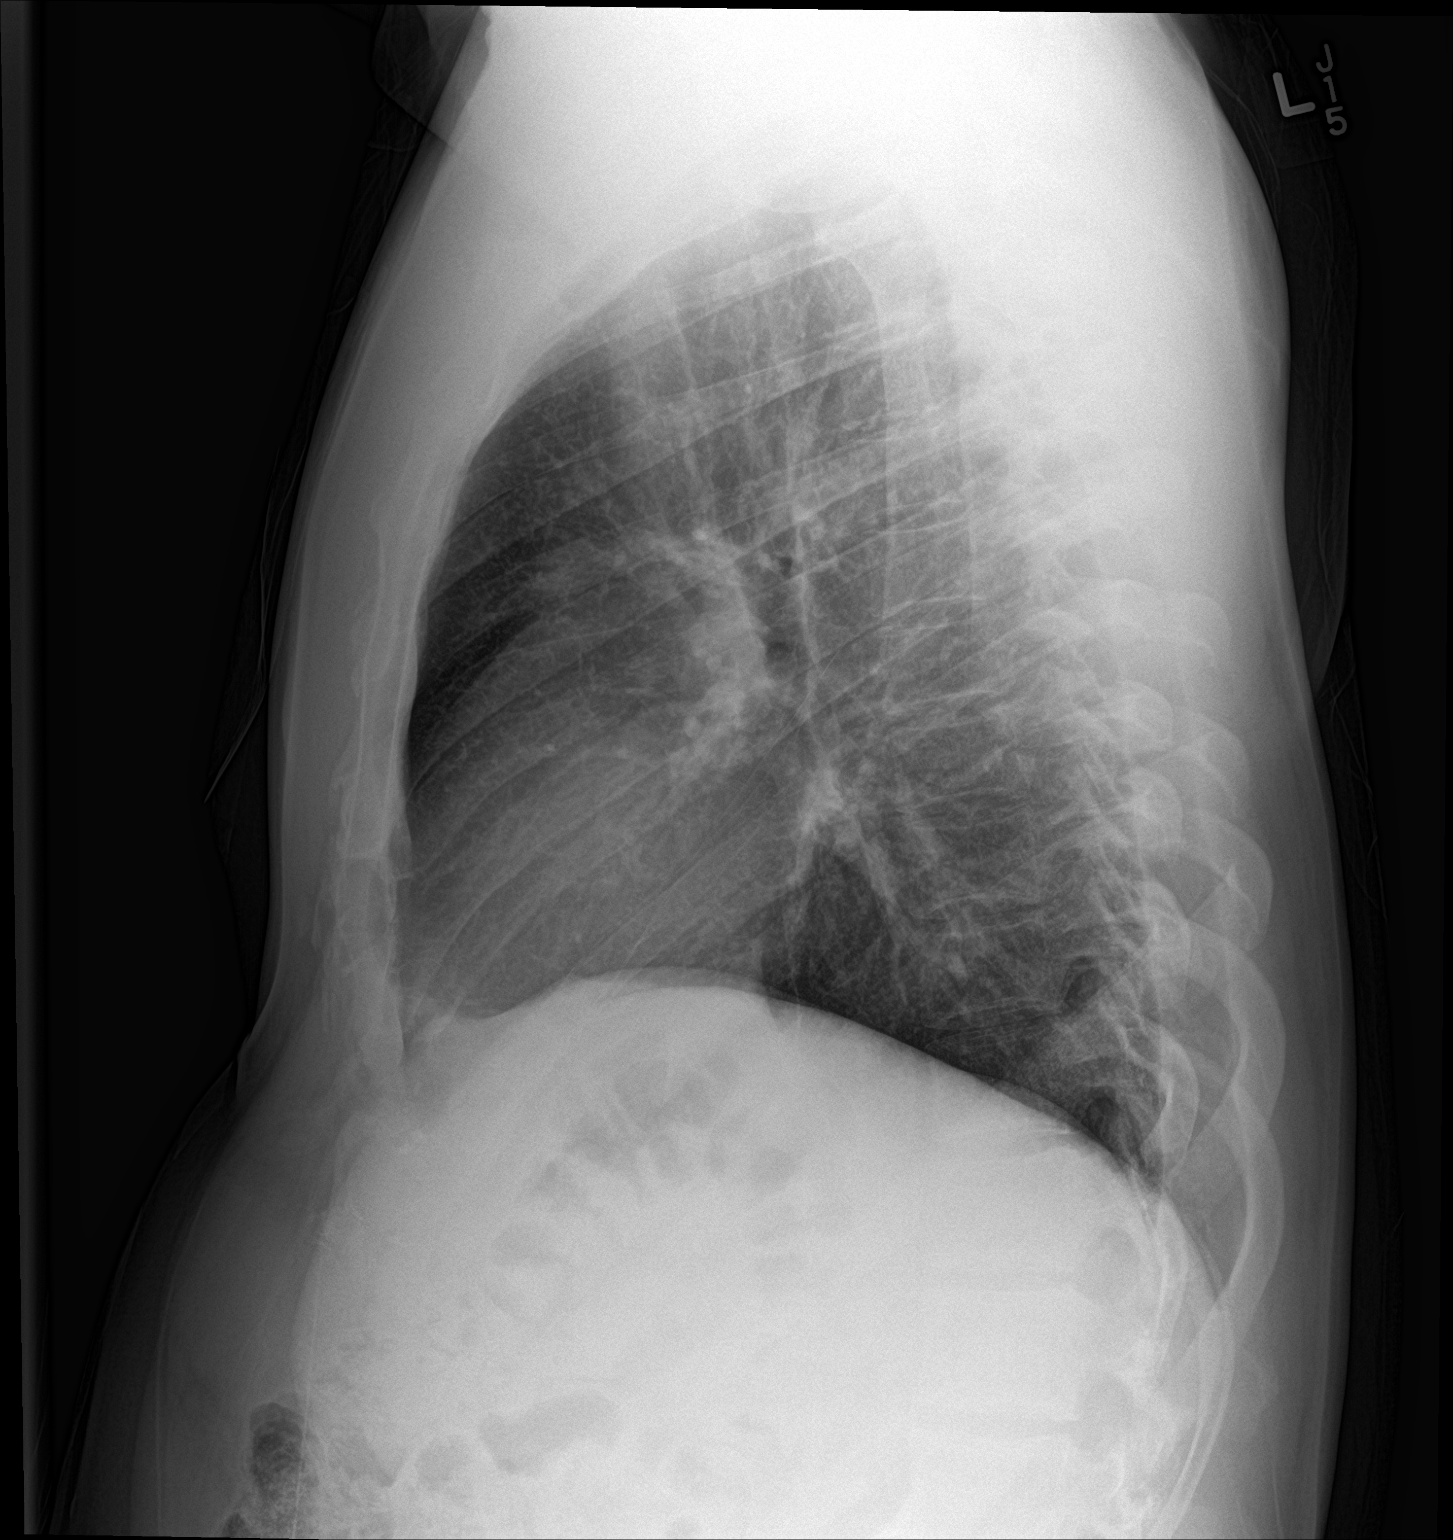

[2 of 2 positions shown; findings below may reference images not displayed]

FINDINGS: Surgical plate with interlocking screws overlies the lower cervical
spine. Stable cardiomediastinal silhouette with normal heart size.
No pneumothorax. No pleural effusion. Lungs appear clear, with no
acute consolidative airspace disease and no pulmonary edema.
IMPRESSION: No active cardiopulmonary disease.

## 2016-03-13 MED ORDER — HYDROCORTISONE ACETATE 25 MG RE SUPP: 25.0000 mg | Freq: Two times a day (BID) | RECTAL | 1 refills | Status: DC

## 2016-03-13 NOTE — Patient Instructions (Signed)
1. Take a fiber supplement 1-2 times per day to help solidify your stools and make them easier to pass, without straining. 2. I send in a prescription for Anusol suppositories to your pharmacy. You can insert 1 up to twice a day as needed for rectal bleeding or hemorrhoid symptoms. 3. Call us at the suppositories were too expensive and we can change it to a rectal cream. 4. We will schedule you for an office visit with Dr. Gala Romney for possible hemorrhoid banding. Whether or not you can undergo banding depends on how many hemorrhoids and the type of hemorrhoids you have.

## 2016-03-13 NOTE — Progress Notes (Signed)
Referring Provider: Lemmie Evens, MD Primary Care Physician:  Robert Bellow, MD Primary GI:  Dr. Gala Romney  Chief Complaint  Patient presents with  . Abdominal Pain    HPI:   Charles [name] is a 46 y.o. male who presents For postprocedure follow-up. The patient was last seen in our office on 11/13/2015 for epigastric pain, rectal bleeding, dark stools. At that point he noted epigastric burning/that her pain 3-4 weeks prior along with nausea but no vomiting. Protonix without relief, change in stool habits with 4 stools a day softer than normal and toilet tissue hematochezia with every bowel movement for the past 3-4 weeks. Also noted black/tarry stools for about 3 days to prior week which since has resolved. Intermittent NSAIDs, aspirin powders 2 weeks prior. Was previously on Xarelto through August 2017 status post hip replacement surgery. Anusol suppositories did not help. Recommended avoid NSAIDs and aspirin powders, avoid trigger foods, scheduled for endoscopy and colonoscopy.  Upper endoscopy completed 12/09/2015 which found LA grade B esophagitis, normal stomach, normal duodenum. Colonoscopy completed the same day found four 5 mm polyps in the sigmoid colon and descending colon, otherwise normal exam. Surgical pathology found polyps to be a mix of tubular adenoma and hyperplastic polyps. Recommended increase Protonix to twice a day. No surveillance endoscopy required. Repeat colonoscopy in 5 years.  Today he states he's doing ok. He's still having bleeding problems. Has some continued abdominal pain mostly LLQ. Has a bowel movement about every morning up to twice a day; Stool is not soft, described as like peanut butter, typically has to strain. Has continued rectal bleeding almost every bowel movement which is almost always toilet tissue hematochezia. States his PCP did an anoscope and said he had internal hemorrhoids. Denies N/V, melena. Denies chest pain, dyspnea, dizziness,  lightheadedness, syncope, near syncope. Denies any other upper or lower GI symptoms.  Is currently on Protonix once daily for the past 2 months. GERD well-controlled. Preparation H typically only minimally effective for burning.  Past Medical History:  Diagnosis Date  . Arthritis   . Chronic back pain   . Chronic neck pain   . Chronic shoulder pain   . COPD (chronic obstructive pulmonary disease) ([place])   . GERD (gastroesophageal reflux disease)    tums  OTC  medication  . Headache   . Hypothyroidism     Past Surgical History:  Procedure Laterality Date  . COLONOSCOPY WITH PROPOFOL N/A 12/09/2015   Procedure: COLONOSCOPY WITH PROPOFOL;  Surgeon: Daneil Dolin, MD;  [facility];  Service: Endoscopy;  Laterality: N/A;  1100  . ESOPHAGOGASTRODUODENOSCOPY (EGD) WITH PROPOFOL N/A 12/09/2015   Procedure: ESOPHAGOGASTRODUODENOSCOPY (EGD) WITH PROPOFOL;  Surgeon: Daneil Dolin, MD;  [facility];  Service: Endoscopy;  Laterality: N/A;  . NECK SURGERY    . SHOULDER SURGERY    . TOTAL HIP ARTHROPLASTY Left 07/30/2015   Procedure: TOTAL HIP ARTHROPLASTY;  Surgeon: Garald Balding, MD;  Location: [place];  Service: Orthopedics;  Laterality: Left;    Current Outpatient Prescriptions  Medication Sig Dispense Refill  . amitriptyline (ELAVIL) 25 MG tablet Take 1 tablet (25 mg total) by mouth at bedtime. 30 tablet 5  . ENBREL SURECLICK 50 MG/ML injection Inject 0.98 mLs (50 mg total) into the skin once a week. 11.76 mL 0  . etanercept (ENBREL SURECLICK) 50 MG/ML injection Inject 0.98 mLs (50 mg total) into the skin once a week. 0.98 mL 0  . levothyroxine (SYNTHROID,  LEVOTHROID) 75 MCG tablet Take 75 mcg by mouth daily before breakfast.   4  . lidocaine (LIDODERM) 5 % Place 2 patches onto the skin daily. Remove & Discard patch after 12 hours or so a day 60 patch 11  . lisinopril (PRINIVIL,ZESTRIL) 10 MG tablet Take 10 mg by mouth daily.  0  . oxyCODONE-acetaminophen (PERCOCET)  10-325 MG tablet Take 1 tablet by mouth 2 (two) times daily as needed for pain.   0  . pantoprazole (PROTONIX) 40 MG tablet Take 40 mg by mouth daily.  0   No current facility-administered medications for this visit.     Allergies as of 03/13/2016  . (No Known Allergies)    Family History  Problem Relation Age of Onset  . Heart failure Mother   . Diabetes Mother   . Hypertension Mother   . Heart failure Father   . Prostate cancer Father   . Colon cancer Neg Hx     Social History   Social History  . Marital status: Married    Spouse name: N/A  . Number of children: N/A  . Years of education: N/A   Social History Main Topics  . Smoking status: Former Smoker    Packs/day: 0.50    Years: 25.00    Types: Cigarettes    Quit date: 05/17/2015  . Smokeless tobacco: Never Used     Comment: USES VAPOR CIGARETTES   . Alcohol use 3.6 oz/week    6 Cans of beer per week     Comment: twice a week: 6-7 drinks per occurrance  . Drug use: No     Comment: states quit  . Sexual activity: Not Asked   Other Topics Concern  . None   Social History Narrative  . None    Review of Systems: General: Negative for anorexia, weight loss, fever, chills, fatigue, weakness. ENT: Negative for hoarseness, difficulty swallowing. CV: Negative for chest pain, angina, palpitations, peripheral edema.  Respiratory: Negative for dyspnea at rest, cough, sputum, wheezing.  GI: See history of present illness. Endo: Negative for unusual weight change.  Heme: Negative for bruising or bleeding.  Physical Exam: BP 136/76   Pulse 78   Temp 98.3 F (36.8 C) (Oral)   Ht 6' (1.829 m)   Wt 208 lb 3.2 oz (94.4 kg)   BMI 28.24 kg/m  General:   Alert and oriented. Pleasant and cooperative. Well-nourished and well-developed.  Eyes:  Without icterus, sclera clear and conjunctiva pink.  Ears:  Normal auditory acuity. Cardiovascular:  S1, S2 present without murmurs appreciated. Extremities without clubbing  or edema. Respiratory:  Clear to auscultation bilaterally. No wheezes, rales, or rhonchi. No distress.  Gastrointestinal:  +BS, soft, non-tender and non-distended. No HSM noted. No guarding or rebound. No masses appreciated.  Rectal:  Deferred  Musculoskalatal:  Symmetrical without gross deformities. Neurologic:  Alert and oriented x4;  grossly normal neurologically. Psych:  Alert and cooperative. Normal mood and affect. Heme/Lymph/Immune: No excessive bruising noted.    03/13/2016 11:10 AM   Disclaimer: This note was dictated with voice recognition software. Similar sounding words can inadvertently be transcribed and may not be corrected upon review.

## 2016-03-16 ENCOUNTER — Encounter: Payer: Self-pay | Admitting: Neurology

## 2016-03-16 ENCOUNTER — Ambulatory Visit (INDEPENDENT_AMBULATORY_CARE_PROVIDER_SITE_OTHER): Payer: 59 | Admitting: Neurology

## 2016-03-16 VITALS — BP 144/70 | HR 80 | Resp 18 | Ht 72.0 in | Wt 213.5 lb

## 2016-03-16 DIAGNOSIS — M542 Cervicalgia: Secondary | ICD-10-CM

## 2016-03-16 DIAGNOSIS — G5711 Meralgia paresthetica, right lower limb: Secondary | ICD-10-CM | POA: Diagnosis not present

## 2016-03-16 DIAGNOSIS — R51 Headache: Secondary | ICD-10-CM

## 2016-03-16 DIAGNOSIS — R519 Headache, unspecified: Secondary | ICD-10-CM | POA: Insufficient documentation

## 2016-03-16 DIAGNOSIS — H539 Unspecified visual disturbance: Secondary | ICD-10-CM

## 2016-03-16 MED ORDER — AMITRIPTYLINE HCL 25 MG PO TABS: 50.0000 mg | ORAL_TABLET | Freq: Every day | ORAL | 5 refills | Status: DC

## 2016-03-16 NOTE — Assessment & Plan Note (Addendum)
Patient with complaints of hemorrhoids and subsequent rectal bleeding. Last colonoscopy without mention of hemorrhoids, PCP did a DRE recently which noted internal hemorrhoids. Patient is interested in hemorrhoid banding if he is a banding candidate. At this point I will set him up for an office visit for possible hemorrhoid banding. Additionally he does note regular bowel movements which are soft, but require straining and 10 to be the consistency of peanut butter. I will recommend a fiber supplement 1-2 times per day as needed to help coalesce his bowel movements to reduce straining and hemorrhoid symptoms. I will also send an Anusol suppositories to help with his hemorrhoid symptoms.

## 2016-03-16 NOTE — Assessment & Plan Note (Signed)
Continued rectal bleeding likely related to hemorrhoids. Recent colonoscopy reassuring. Follow-up for possible hemorrhoid banding. Call if any worsening symptoms.
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
## 2016-03-17 NOTE — Progress Notes (Signed)
cc'ed to pcp °

## 2016-03-23 ENCOUNTER — Telehealth: Payer: Self-pay

## 2016-03-23 NOTE — Telephone Encounter (Signed)
Noted patient's last refill of Enbrel was on 02/25/16 at [facility]. I called patient with a refill reminder call.  Patient confirms he does need a refill at this time. He has one pen left that he will use on 03/24/16. Will have the outpatient pharmacy process patient's prescription at this time and mail to his home.   Rigo Letts, [place], CPhT 12:09 PM

## 2016-03-24 ENCOUNTER — Ambulatory Visit (HOSPITAL_COMMUNITY)
Admission: RE | Admit: 2016-03-24 | Discharge: 2016-03-24 | Disposition: A | Discharge status: Home / Self Care | Payer: 59 | Source: Ambulatory Visit | Attending: Neurology | Admitting: Neurology

## 2016-03-24 ENCOUNTER — Telehealth: Payer: Self-pay | Admitting: Pharmacist

## 2016-03-24 DIAGNOSIS — R51 Headache: Secondary | ICD-10-CM | POA: Insufficient documentation

## 2016-03-24 DIAGNOSIS — H539 Unspecified visual disturbance: Secondary | ICD-10-CM

## 2016-03-24 DIAGNOSIS — G8929 Other chronic pain: Secondary | ICD-10-CM

## 2016-03-24 DIAGNOSIS — Z79899 Other long term (current) drug therapy: Secondary | ICD-10-CM

## 2016-03-24 LAB — POCT I-STAT CREATININE: Creatinine, Ser: 1 mg/dL (ref 0.61–1.24)

## 2016-03-24 MED ORDER — GADOBENATE DIMEGLUMINE 529 MG/ML IV SOLN
20.0000 mL | Freq: Once | INTRAVENOUS | Status: AC | PRN
  Administered 2016-03-24: 20 mL via INTRAVENOUS

## 2016-03-24 MED ORDER — ENBREL SURECLICK 50 MG/ML ~~LOC~~ SOAJ: 50.0000 mg | SUBCUTANEOUS | 0 refills | Status: DC

## 2016-03-24 NOTE — Telephone Encounter (Signed)
Advised patient Enbrel refill was sent to [facility].  Reminded patient he is due for standing labs in April 2018.  Patient voiced understanding.     Elisabeth Most, Pharm.D., BCPS, CPP Clinical Pharmacist Pager: [phone] Phone: [phone] 03/24/2016 4:59 PM

## 2016-03-24 NOTE — Telephone Encounter (Signed)
Received notification that [facility] is now the required specialty pharmacy for patient.  He will need a new Enbrel prescription through [facility].  I informed patient of this information.  Provided him with the phone number of [facility] ([phone]).  Patient confirms he had a pen for today and will be due for refill by 03/31/16.  Advised patient to call us if medication is not delivered by 03/31/16.    Last visit: 02/04/16 Next visit: 06/04/16 Labs: 02/04/16 CBC normal, CMP Cr 1.51, GFR 56 (decreased from previous labs), TB Gold negative  Okay to refill Enbrel?

## 2016-03-24 NOTE — Telephone Encounter (Signed)
ok to refill Enbrel

## 2016-03-25 ENCOUNTER — Telehealth: Payer: Self-pay | Admitting: *Deleted

## 2016-03-25 NOTE — Telephone Encounter (Signed)
I have spoken with Andi this morning and per RAS, advised MRI brain is normal.  He verbalized understanding of same/fim

## 2016-03-25 NOTE — Telephone Encounter (Signed)
-----   Message from Britt Bottom, MD sent at 03/24/2016  5:09 PM EDT ----- Please let him know that the MRI of the brain is normal

## 2016-03-31 ENCOUNTER — Telehealth: Payer: Self-pay | Admitting: *Deleted

## 2016-03-31 NOTE — Telephone Encounter (Signed)
Last visit: 02/04/16 Next visit: 06/04/16 Labs: 02/04/16 CBC normal, CMP Cr 1.51, GFR 56 (decreased from previous labs), TB Gold negative  Okay to give Enbrel sample?

## 2016-03-31 NOTE — Telephone Encounter (Signed)
Patient's wife came by office to pick up sample.   Medication Samples have been provided to the patient.  Drug name: Enbrel      Strength: 50 mg       Qty:1 LOT: [id number]  Exp.Date: 02/2018  Dosing instructions: Inject one pen under skin once weekly.   The patient has been instructed regarding the correct time, dose, and frequency of taking this medication, including desired effects and most common side effects.   Gwenlyn Perking 3:38 PM 03/31/2016

## 2016-03-31 NOTE — Telephone Encounter (Signed)
Ok to refill enbrel ( since shipment will be slightly delayed).

## 2016-04-14 ENCOUNTER — Encounter: Payer: Self-pay | Admitting: Internal Medicine

## 2016-04-14 ENCOUNTER — Ambulatory Visit (INDEPENDENT_AMBULATORY_CARE_PROVIDER_SITE_OTHER): Payer: 59 | Admitting: Internal Medicine

## 2016-04-14 VITALS — BP 151/83 | HR 71 | Temp 98.2°F | Ht 72.0 in | Wt 207.8 lb

## 2016-04-14 DIAGNOSIS — K648 Other hemorrhoids: Secondary | ICD-10-CM | POA: Diagnosis not present

## 2016-04-14 NOTE — Patient Instructions (Signed)
Avoid straining.  Benefiber 1 tablespoon twice daily  Limit toilet time to 5 minutes  Call with any interim problems  Schedule followup appointment in 4 weeks from now   

## 2016-04-14 NOTE — Progress Notes (Signed)
[place] banding procedure note:  The patient presents with symptomatic grade 1 hemorrhoids unresponsive to maximal medical therapy, requesting rubber band ligation of his hemorrhoidal disease. All risks, benefits, and alternative forms of therapy were described and informed consent was obtained.  In the left lateral decubitus position, a DRE utilizing 0.125% nitroglycerin ointment and Xylocaine gel as lubricant, revealed no abnormalities. Anoscopy revealed a prominent left lateral hemorrhoid column only. I did not appreciate a fissure or other abnormality.  The decision was made to band the left lateral internal hemorrhoid;  the [place] was used to perform band ligation without complication. Digital anorectal examination was then performed to assure proper positioning of the band;  band found to be in excellent position. No pinching or pain. Dietary and behavioral recommendations were given.The patient will return in 4 weeks for followup and possible additional banding as required.  No complications were encountered and the patient tolerated the procedure well.

## 2016-04-15 ENCOUNTER — Other Ambulatory Visit (HOSPITAL_COMMUNITY): Payer: Self-pay | Admitting: Family Medicine

## 2016-04-15 DIAGNOSIS — R1032 Left lower quadrant pain: Secondary | ICD-10-CM

## 2016-04-16 ENCOUNTER — Emergency Department (HOSPITAL_COMMUNITY)
Admission: EM | Admit: 2016-04-16 | Discharge: 2016-04-17 | Disposition: A | Discharge status: Home / Self Care | Payer: 59 | Attending: Emergency Medicine | Admitting: Emergency Medicine

## 2016-04-16 ENCOUNTER — Encounter (HOSPITAL_COMMUNITY): Payer: Self-pay | Admitting: *Deleted

## 2016-04-16 DIAGNOSIS — Y929 Unspecified place or not applicable: Secondary | ICD-10-CM | POA: Diagnosis not present

## 2016-04-16 DIAGNOSIS — J449 Chronic obstructive pulmonary disease, unspecified: Secondary | ICD-10-CM | POA: Insufficient documentation

## 2016-04-16 DIAGNOSIS — E039 Hypothyroidism, unspecified: Secondary | ICD-10-CM | POA: Diagnosis not present

## 2016-04-16 DIAGNOSIS — Y999 Unspecified external cause status: Secondary | ICD-10-CM | POA: Insufficient documentation

## 2016-04-16 DIAGNOSIS — W270XXA Contact with workbench tool, initial encounter: Secondary | ICD-10-CM | POA: Insufficient documentation

## 2016-04-16 DIAGNOSIS — S61011A Laceration without foreign body of right thumb without damage to nail, initial encounter: Secondary | ICD-10-CM | POA: Diagnosis not present

## 2016-04-16 DIAGNOSIS — Z87891 Personal history of nicotine dependence: Secondary | ICD-10-CM | POA: Insufficient documentation

## 2016-04-16 DIAGNOSIS — Y9389 Activity, other specified: Secondary | ICD-10-CM | POA: Diagnosis not present

## 2016-04-16 MED ORDER — LIDOCAINE HCL (PF) 1 % IJ SOLN
INTRAMUSCULAR | Status: AC
  Administered 2016-04-16: 23:00:00
  Filled 2016-04-16: qty 5

## 2016-04-16 MED ORDER — TETANUS-DIPHTH-ACELL PERTUSSIS 5-2.5-18.5 LF-MCG/0.5 IM SUSP
0.5000 mL | Freq: Once | INTRAMUSCULAR | Status: AC
  Administered 2016-04-16: 0.5 mL via INTRAMUSCULAR
  Filled 2016-04-16: qty 0.5

## 2016-04-16 NOTE — ED Provider Notes (Signed)
[place] [facility] DEPT Provider Note   CSN: [id number] Arrival date & time: 04/16/16  2034     History   Chief Complaint Chief Complaint  Patient presents with  . Laceration    HPI Charles [name] is a 46 y.o. male.  HPI  Charles [name] is a 46 y.o. male who presents to the Emergency Department complaining of laceration to right thumb that occurred shortly before ER arrival.  He was using a small hatchet when the laceration occurred.  He denies numbness, swelling or difficulty movement the digit.  Last td is unknown.   Past Medical History:  Diagnosis Date  . Arthritis   . Chronic back pain   . Chronic neck pain   . Chronic shoulder pain   . COPD (chronic obstructive pulmonary disease) ([place])   . GERD (gastroesophageal reflux disease)    tums  OTC  medication  . Headache   . Hypothyroidism     Patient Active Problem List   Diagnosis Date Noted  . Cephalalgia 03/16/2016  . Chronic daily headache 03/16/2016  . Neck pain 03/16/2016  . Hemorrhoids 03/13/2016  . 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 Insomnia 12/26/2015  . Abdominal pain, epigastric 11/13/2015  . Dark stools 11/13/2015  . Rectal bleeding 11/13/2015  . Numbness 09/26/2015  . 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 Avascular necrosis of left femoral head ([place]) 07/30/2015  . Avascular necrosis of right femoral head ([place]) 07/30/2015  . Psoriasis 07/30/2015  . Chronic obstructive pulmonary disease (COPD) ([place]) 07/30/2015  . 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    Past Surgical History:  Procedure Laterality Date  . COLONOSCOPY WITH PROPOFOL N/A 12/09/2015   Procedure: COLONOSCOPY WITH PROPOFOL;  Surgeon: Daneil Dolin, MD;  [facility];  Service: Endoscopy;  Laterality: N/A;  1100  . ESOPHAGOGASTRODUODENOSCOPY (EGD) WITH PROPOFOL N/A 12/09/2015   Procedure: ESOPHAGOGASTRODUODENOSCOPY (EGD) WITH PROPOFOL;  Surgeon:  Daneil Dolin, MD;  [facility];  Service: Endoscopy;  Laterality: N/A;  . NECK SURGERY    . SHOULDER SURGERY    . TOTAL HIP ARTHROPLASTY Left 07/30/2015   Procedure: TOTAL HIP ARTHROPLASTY;  Surgeon: Garald Balding, MD;  Location: [facility];  Service: Orthopedics;  Laterality: Left;       Home Medications    Prior to Admission medications   Medication Sig Start Date End Date Taking? Authorizing Provider  amitriptyline (ELAVIL) 25 MG tablet Take 2 tablets (50 mg total) by mouth at bedtime. Patient taking differently: Take 50 mg by mouth at bedtime as needed.  03/16/16   Britt Bottom, MD  ENBREL SURECLICK 50 MG/ML injection Inject 0.98 mLs (50 mg total) into the skin once a week. 03/24/16   Bo Merino, MD  hydrocortisone (ANUSOL-HC) 25 MG suppository Place 1 suppository (25 mg total) rectally every 12 (twelve) hours. Patient not taking: Reported on 04/14/2016 03/13/16   Carlis Stable, NP  levothyroxine (SYNTHROID, LEVOTHROID) 75 MCG tablet Take 75 mcg by mouth daily before breakfast.  05/16/15   Historical Provider, MD  lidocaine (LIDODERM) 5 % Place 2 patches onto the skin daily. Remove & Discard patch after 12 hours or so a day 12/26/15   Britt Bottom, MD  lisinopril (PRINIVIL,ZESTRIL) 10 MG tablet Take 10 mg by mouth daily. 10/29/15   Historical Provider, MD  oxyCODONE-acetaminophen (PERCOCET) 10-325 MG tablet Take 1 tablet by mouth 2 (two) times daily as needed for  pain.  10/25/15   Historical Provider, MD  pantoprazole (PROTONIX) 40 MG tablet Take 40 mg by mouth daily. 11/05/15   Historical Provider, MD    Family History Family History  Problem Relation Age of Onset  . Heart failure Mother   . Diabetes Mother   . Hypertension Mother   . Heart failure Father   . Prostate cancer Father   . Colon cancer Neg Hx     Social History Social History  Substance Use Topics  . Smoking status: Former Smoker    Packs/day: 0.50    Years: 25.00    Types: Cigarettes     Quit date: 05/17/2015  . Smokeless tobacco: Never Used     Comment: USES VAPOR CIGARETTES   . Alcohol use 3.6 oz/week    6 Cans of beer per week     Comment: twice a week: 6-7 drinks per occurrance     Allergies   Patient has no known allergies.   Review of Systems Review of Systems  Constitutional: Negative for chills and fever.  Musculoskeletal: Negative for arthralgias, back pain and joint swelling.  Skin: Positive for wound.       Laceration   Neurological: Negative for dizziness, weakness and numbness.  Hematological: Does not bruise/bleed easily.  All other systems reviewed and are negative.    Physical Exam Updated Vital Signs BP 130/89   Pulse 95   Temp 97.8 F (36.6 C) (Oral)   Resp 18   Wt 93.9 kg   SpO2 98%   BMI 28.07 kg/m   Physical Exam  Constitutional: He is oriented to person, place, and time. He appears well-developed and well-nourished. No distress.  HENT:  Head: Normocephalic and atraumatic.  Cardiovascular: Normal rate, regular rhythm and intact distal pulses.   No murmur heard. Pulmonary/Chest: Effort normal and breath sounds normal. No respiratory distress.  Musculoskeletal: Normal range of motion. He exhibits tenderness. He exhibits no edema.       Right hand: He exhibits laceration. He exhibits normal range of motion, no bony tenderness, normal two-point discrimination and no swelling. Normal sensation noted. Normal strength noted. He exhibits no finger abduction and no wrist extension trouble.       Hands: Laceration of right thenar eminence.  Bleeding controlled.  No edema.  Neurological: He is alert and oriented to person, place, and time. He exhibits normal muscle tone. Coordination normal.  Skin: Skin is warm. Capillary refill takes less than 2 seconds.  Nursing note and vitals reviewed.    ED Treatments / Results  Labs (all labs ordered are listed, but only abnormal results are displayed) Labs Reviewed - No data to display  EKG   EKG Interpretation None       Radiology No results found.  Procedures Procedures (including critical care time)  LACERATION REPAIR Performed by: Brendalee Matthies L. Authorized by: Hale Bogus Consent: Verbal consent obtained. Risks and benefits: risks, benefits and alternatives were discussed Consent given by: patient Patient identity confirmed: provided demographic data Prepped and Draped in normal sterile fashion Wound explored  Laceration Location: right  thumb  Laceration Length: 2.5 cm  No Foreign Bodies seen or palpated  Anesthesia: local infiltration  Local anesthetic: lidocaine 1% w/o epinephrine  Anesthetic total: 2 ml  Irrigation method: syringe Amount of cleaning: standard  Skin closure: 3-0 prolene  Number of sutures: 4  Technique: simple interrupted  Patient tolerance: Patient tolerated the procedure well with no immediate complications.   Medications Ordered in ED Medications  lidocaine (PF) (XYLOCAINE) 1 % injection (  Given by Other 04/16/16 2308)  Tdap (BOOSTRIX) injection 0.5 mL (0.5 mLs Intramuscular Given 04/16/16 2345)     Initial Impression / Assessment and Plan / ED Course  I have reviewed the triage vital signs and the nursing notes.  Pertinent labs & imaging results that were available during my care of the patient were reviewed by me and considered in my medical decision making (see chart for details).     NV intact.  Laceration of the thenar eminence.  Bleeding controlled.  Sutures out in 10 days.  Wound care instructions given.    Final Clinical Impressions(s) / ED Diagnoses   Final diagnoses:  Laceration of right thumb without foreign body without damage to nail, initial encounter    New Prescriptions New Prescriptions   No medications on file     Bufford Lope 04/19/16 2029    Fredia Sorrow, MD 04/20/16 1538

## 2016-04-16 NOTE — ED Triage Notes (Signed)
Pt c/o laceration to right hand from a hatchet when he was cutting bushes tonight, bleeding controlled,

## 2016-04-16 NOTE — Discharge Instructions (Signed)
Keep the wound clean with mild soap and water and keep it bandaged.  Sutures out in 10 days.  Return here for any signs of infection

## 2016-04-28 ENCOUNTER — Ambulatory Visit (HOSPITAL_COMMUNITY): Payer: 59

## 2016-05-08 ENCOUNTER — Ambulatory Visit (HOSPITAL_COMMUNITY): Payer: 59

## 2016-05-19 ENCOUNTER — Encounter: Payer: Self-pay | Admitting: Internal Medicine

## 2016-05-19 ENCOUNTER — Ambulatory Visit (INDEPENDENT_AMBULATORY_CARE_PROVIDER_SITE_OTHER): Payer: 59 | Admitting: Internal Medicine

## 2016-05-19 VITALS — BP 128/82 | HR 72 | Temp 98.0°F | Ht 72.0 in | Wt 209.6 lb

## 2016-05-19 DIAGNOSIS — B359 Dermatophytosis, unspecified: Secondary | ICD-10-CM

## 2016-05-19 DIAGNOSIS — K648 Other hemorrhoids: Secondary | ICD-10-CM | POA: Diagnosis not present

## 2016-05-19 NOTE — Patient Instructions (Signed)
Avoid straining.  Benefiber 1 tablespoon  twice daily  Limit toilet time to 5 minutes  Over-the-counter miconazole nitrate 2% cream applied to the anorectum 3 times daily as needed for itching  Call with any interim problems  Schedule followup appointment in 4 weeks from now

## 2016-05-19 NOTE — Progress Notes (Signed)
  [place] banding procedure note:  The patient presents with symptomatic  Hemorrhoids; unresponsive to maximal medical therapy;  status post banding of the left lateral column previously. All the symptoms have improved to a noticeable degree but have not yet resolved. He desires a second band be placed today. All risks, benefits, and alternative forms of therapy were described and informed consent was obtained.  In the left lateral decubitus position, a DRE revealed no abnormalities. Perianal skin a little red today.  The decision was made to band the right anterior internal hemorrhoid; the [place] was used to perform band ligation without complication. Digital anorectal examination was then performed to assure proper positioning of the band and to adjust the banded tissue as required. Band count to be in excellent position. No pinching or pain. I elected to place a second band in the right lateral position. This was done without difficulty. No pinching or pain. Good position on DRE. The patient was discharged home without pain or other issues. Dietary and behavioral recommendations were given.  Course of miconazole nitrate 2% cream to the anorectum recommended  along with follow-up instructions. The patient will return in 4 weeks for followup and possible additional banding as required.  No complications were encountered and the patient tolerated the procedure well.

## 2016-05-22 DIAGNOSIS — M47812 Spondylosis without myelopathy or radiculopathy, cervical region: Secondary | ICD-10-CM | POA: Insufficient documentation

## 2016-05-22 DIAGNOSIS — M4726 Other spondylosis with radiculopathy, lumbar region: Secondary | ICD-10-CM | POA: Insufficient documentation

## 2016-05-22 DIAGNOSIS — Z79899 Other long term (current) drug therapy: Secondary | ICD-10-CM | POA: Insufficient documentation

## 2016-05-22 DIAGNOSIS — M461 Sacroiliitis, not elsewhere classified: Secondary | ICD-10-CM | POA: Insufficient documentation

## 2016-05-22 DIAGNOSIS — L4050 Arthropathic psoriasis, unspecified: Secondary | ICD-10-CM | POA: Insufficient documentation

## 2016-05-22 NOTE — Progress Notes (Addendum)
Office Visit Note  Patient: Charles [name]             Date of Birth: May 22, 1970           MRN: [id number]             PCP: Lemmie Evens, MD Referring: Lemmie Evens, MD Visit Date: 06/04/2016 Occupation: @GUAROCC @    Subjective:  Arthritis (Follow up)   History of Present Illness: Charles [name] is a 46 y.o. male  Last seen in our office 4 months ago. Patient has a history of psoriasis as well as bilateral sacroiliitis found on MRI. He's been taking Enbrel 50 mg per week on Tuesdays and doing well. Unfortunately, patient started to experience pain 2 days before his injections are due. He rates that discomfort as a 5 on a scale of 0-10. On Tuesday, when he gets his Enbrel injection, his pain goes down to 0 and remained 0 for at least 5 days.    Patient's TB gold is negative as of January 2018. He is due for labs today. His last labs that we have on file are dated January 2018.  Patient is also complaining of swelling in his volar right wrist consistent with a ganglion cyst. It has become more painful lately.  Activities of Daily Living:  Patient reports morning stiffness for 15 minutes.   Patient Reports nocturnal pain.  Difficulty dressing/grooming: Denies Difficulty climbing stairs: Reports Difficulty getting out of chair: Reports Difficulty using hands for taps, buttons, cutlery, and/or writing: Denies   Review of Systems  Constitutional: Negative for fatigue.  HENT: Negative for mouth sores and mouth dryness.   Eyes: Negative for dryness.  Respiratory: Negative for shortness of breath.   Gastrointestinal: Negative for constipation and diarrhea.  Musculoskeletal: Negative for myalgias and myalgias.  Skin: Negative for sensitivity to sunlight.  Neurological: Negative for memory loss.  Psychiatric/Behavioral: Negative for sleep disturbance.    PMFS History:  Patient Active Problem List   Diagnosis Date Noted  . Bilateral sacroiliitis ([place]) 05/22/2016  .  Psoriatic arthritis ([place]) 05/22/2016  . High risk medications (not anticoagulants) long-term use 05/22/2016  . DJD (degenerative joint disease), cervical 05/22/2016  . Osteoarthritis of spine with radiculopathy, lumbar region 05/22/2016  . Cephalalgia 03/16/2016  . Chronic daily headache 03/16/2016  . Neck pain 03/16/2016  . Hemorrhoids 03/13/2016  . 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 Insomnia 12/26/2015  . Abdominal pain, epigastric 11/13/2015  . Dark stools 11/13/2015  . Rectal bleeding 11/13/2015  . Numbness 09/26/2015  . 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 Avascular necrosis of left femoral head ([place]) 07/30/2015  . Avascular necrosis of right femoral head ([place]) 07/30/2015  . Psoriasis 07/30/2015  . Chronic obstructive pulmonary disease (COPD) ([place]) 07/30/2015  . 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 Arthritis   . Chronic back pain   . Chronic neck pain   . Chronic shoulder pain   . COPD (chronic obstructive pulmonary disease) ([place])   . GERD (gastroesophageal reflux disease)    tums  OTC  medication  . Headache   . Hypothyroidism     Family History  Problem Relation Age of Onset  . Heart failure Mother   . Diabetes Mother   . Hypertension Mother   . Heart failure Father   . Prostate cancer Father   . Colon cancer Neg Hx  Past Surgical History:  Procedure Laterality Date  . COLONOSCOPY WITH PROPOFOL N/A 12/09/2015   Procedure: COLONOSCOPY WITH PROPOFOL;  Surgeon: Daneil Dolin, MD;  [facility];  Service: Endoscopy;  Laterality: N/A;  1100  . ESOPHAGOGASTRODUODENOSCOPY (EGD) WITH PROPOFOL N/A 12/09/2015   Procedure: ESOPHAGOGASTRODUODENOSCOPY (EGD) WITH PROPOFOL;  Surgeon: Daneil Dolin, MD;  [facility]: [facility];  Service: Endoscopy;  Laterality: N/A;  . NECK SURGERY    . SHOULDER SURGERY    . TOTAL HIP ARTHROPLASTY  Left 07/30/2015   Procedure: TOTAL HIP ARTHROPLASTY;  Surgeon: Garald Balding, MD;  Location: [facility];  Service: Orthopedics;  Laterality: Left;   Social History   Social History Narrative  . No narrative on file     Objective: Vital Signs: BP 130/68 (BP Location: Left Arm, Patient Position: Sitting, Cuff Size: Normal)   Pulse 84   Resp 16   Ht 6' (1.829 m)   Wt 211 lb (95.7 kg)   BMI 28.62 kg/m    Physical Exam  Constitutional: He is oriented to person, place, and time. He appears well-developed and well-nourished.  HENT:  Head: Normocephalic and atraumatic.  Eyes: Conjunctivae and EOM are normal. Pupils are equal, round, and reactive to light.  Neck: Normal range of motion. Neck supple.  Cardiovascular: Normal rate, regular rhythm and normal heart sounds.  Exam reveals no gallop and no friction rub.   No murmur heard. Pulmonary/Chest: Effort normal and breath sounds normal. No respiratory distress. He has no wheezes. He has no rales. He exhibits no tenderness.  Abdominal: Soft. He exhibits no distension and no mass. There is no tenderness. There is no guarding.  Musculoskeletal: Normal range of motion.  Lymphadenopathy:    He has no cervical adenopathy.  Neurological: He is alert and oriented to person, place, and time. He exhibits normal muscle tone. Coordination normal.  Skin: Skin is warm and dry. Capillary refill takes less than 2 seconds. No rash noted.  Psychiatric: He has a normal mood and affect. His behavior is normal. Judgment and thought content normal.  Vitals reviewed.    Musculoskeletal Exam:  Full range of motion of all joints Grip strength is equal and strong bilaterally Fiber myalgia tender points are all absent  CDAI Exam: CDAI Homunculus Exam:   Joint Counts:  CDAI Tender Joint count: 0 CDAI Swollen Joint count: 0  Global Assessments:  Patient Global Assessment: 5 Provider Global Assessment: 5  CDAI Calculated Score: 10  No synovitis on  examination. The pain a 5 is related to Sunday and Monday due to flare in his lower back due to insufficient amount of medication (Enbrel).  Investigation: Findings:  02/04/16 negative TB gold   CBC Latest Ref Rng & Units 02/04/2016 11/13/2015 08/01/2015  WBC 3.8 - 10.8 K/uL 8.6 7.0 6.4  Hemoglobin 13.2 - 17.1 g/dL 15.1 15.5 11.3(L)  Hematocrit 38.5 - 50.0 % 44.2 46.8 34.0(L)  Platelets 140 - 400 K/uL 191 212 140(L)   CMP Latest Ref Rng & Units 03/24/2016 02/04/2016 11/13/2015  Glucose 65 - 99 mg/dL - 79 90  BUN 7 - 25 mg/dL - 20 15  Creatinine 0.61 - 1.24 mg/dL 1.00 1.51(H) 0.97  Sodium 135 - 146 mmol/L - 141 138  Potassium 3.5 - 5.3 mmol/L - 4.9 4.8  Chloride 98 - 110 mmol/L - 105 102  CO2 20 - 31 mmol/L - 28 26  Calcium 8.6 - 10.3 mg/dL - 10.1 9.8  Total Protein 6.1 - 8.1 g/dL -  7.3 6.9  Total Bilirubin 0.2 - 1.2 mg/dL - 0.7 1.0  Alkaline Phos 40 - 115 U/L - 43 47  AST 10 - 40 U/L - 15 14  ALT 9 - 46 U/L - 13 13    Imaging: No results found.      MILDLY FLUCTUANT. C/W GANGLION CYST.  Speciality Comments: No specialty comments available.    Procedures:  No procedures performed Allergies: Patient has no known allergies.   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 History of psoriasis. On today's examination and based on history, it is more likely that the patient has spondyloarthropathy. Psoriatic Arthritis is the new diagnosis based on patients current symptoms of Ps and Bilateral Sacroiliitis. (note:  Pt is not doing well Enbrel since he is failing Enbrel (has flare on Sunday and Monday and pain is "5" two days before his enbrel is due.)  We discussed the option of switching from Enbrel the Cosentyx. We will discussed the risks and benefits of the medication. If patient is agreeable, we plan to see get his consent and then we will apply for Cosentyx.  #2: High risk prescription. Enbrel 50 mg every week. Patient is failing Enbrel at the moment. He is only getting 5 days of comfort. On Sunday and Monday, he's having significant amount of pain and he rates that pain is 5 on a scale of 0-10 to his lower back. On Tuesday, the day he takes his Enbrel, his pain goes down to 0 and remained 0 for the next 5 days.  #3: Right volar wrist ganglion cyst. We will refer him to Dr. Burney Gauze  ( [facility]) for evaluation and treatment. We've advised the patient not to rupture the cyst on his own using home remedies I sent a message to Seth Bake to make the referral for [place].  #4: Low back pain. Consistent with sacroiliitis and possibly psoriatic arthritis Inadequate response to Enbrel.  #5: CBC with differential CMP with GFR are due today. We will get that in the office TB go was negative January 2018  #6: Return to clinic in 3 months  #7: Consent and handout on Cosentyx. Diagnosis: Psoriatic arthritis; inadequate response to Enbrel; history of psoriasis (no current psoriasis flare at this visit).  Orders: Orders Placed This Encounter  Procedures  . CBC with Differential/Platelet  . COMPLETE METABOLIC PANEL WITH GFR   No  orders of the defined types were placed in this encounter.   Face-to-face time spent with patient was 40 minutes. 50% of time was spent in counseling and coordination of care.  Follow-Up Instructions: Return in about 3 months (around 09/04/2016).   Kimara Bencomo Carlyon Shadow, PA-C   Will refer to hand surgery for the ganglion cyst. I examined and evaluated the  patient with Eliezer Lofts PA. I had detailed discussion with patient regarding use of Cosyntex. We will proceed the application process. The plan of care was discussed as noted above.  Bo Merino, MD Note - This record has been created using Editor, commissioning.  Chart creation errors have been sought, but may not always  have been located. Such creation errors do not reflect on  the standard of medical care.

## 2016-06-01 ENCOUNTER — Ambulatory Visit (INDEPENDENT_AMBULATORY_CARE_PROVIDER_SITE_OTHER): Payer: BLUE CROSS/BLUE SHIELD | Admitting: Orthopaedic Surgery

## 2016-06-04 ENCOUNTER — Ambulatory Visit (INDEPENDENT_AMBULATORY_CARE_PROVIDER_SITE_OTHER): Payer: 59 | Admitting: Rheumatology

## 2016-06-04 ENCOUNTER — Encounter: Payer: Self-pay | Admitting: Rheumatology

## 2016-06-04 ENCOUNTER — Telehealth: Payer: Self-pay | Admitting: *Deleted

## 2016-06-04 VITALS — BP 130/68 | HR 84 | Resp 16 | Ht 72.0 in | Wt 211.0 lb

## 2016-06-04 DIAGNOSIS — M87052 Idiopathic aseptic necrosis of left femur: Secondary | ICD-10-CM | POA: Diagnosis not present

## 2016-06-04 DIAGNOSIS — M47812 Spondylosis without myelopathy or radiculopathy, cervical region: Secondary | ICD-10-CM

## 2016-06-04 DIAGNOSIS — L4050 Arthropathic psoriasis, unspecified: Secondary | ICD-10-CM | POA: Diagnosis not present

## 2016-06-04 DIAGNOSIS — Z96642 Presence of left artificial hip joint: Secondary | ICD-10-CM

## 2016-06-04 DIAGNOSIS — G5712 Meralgia paresthetica, left lower limb: Secondary | ICD-10-CM

## 2016-06-04 DIAGNOSIS — M461 Sacroiliitis, not elsewhere classified: Secondary | ICD-10-CM | POA: Diagnosis not present

## 2016-06-04 DIAGNOSIS — M542 Cervicalgia: Secondary | ICD-10-CM | POA: Diagnosis not present

## 2016-06-04 DIAGNOSIS — F5101 Primary insomnia: Secondary | ICD-10-CM | POA: Diagnosis not present

## 2016-06-04 DIAGNOSIS — Z79899 Other long term (current) drug therapy: Secondary | ICD-10-CM

## 2016-06-04 DIAGNOSIS — M4726 Other spondylosis with radiculopathy, lumbar region: Secondary | ICD-10-CM | POA: Diagnosis not present

## 2016-06-04 DIAGNOSIS — M87051 Idiopathic aseptic necrosis of right femur: Secondary | ICD-10-CM

## 2016-06-04 DIAGNOSIS — G5711 Meralgia paresthetica, right lower limb: Secondary | ICD-10-CM

## 2016-06-04 DIAGNOSIS — L409 Psoriasis, unspecified: Secondary | ICD-10-CM

## 2016-06-04 DIAGNOSIS — M67431 Ganglion, right wrist: Secondary | ICD-10-CM | POA: Diagnosis not present

## 2016-06-04 DIAGNOSIS — M5030 Other cervical disc degeneration, unspecified cervical region: Secondary | ICD-10-CM

## 2016-06-04 LAB — CBC WITH DIFFERENTIAL/PLATELET
BASOS PCT: 0 %
Basophils Absolute: 0 cells/uL (ref 0–200)
Eosinophils Absolute: 192 cells/uL (ref 15–500)
Eosinophils Relative: 3 %
HCT: 43.3 % (ref 38.5–50.0)
Hemoglobin: 15 g/dL (ref 13.2–17.1)
LYMPHS PCT: 35 %
Lymphs Abs: 2240 cells/uL (ref 850–3900)
MCH: 32.3 pg (ref 27.0–33.0)
MCHC: 34.6 g/dL (ref 32.0–36.0)
MCV: 93.1 fL (ref 80.0–100.0)
MONOS PCT: 8 %
MPV: 11.6 fL (ref 7.5–12.5)
Monocytes Absolute: 512 cells/uL (ref 200–950)
Neutro Abs: 3456 cells/uL (ref 1500–7800)
Neutrophils Relative %: 54 %
PLATELETS: 162 10*3/uL (ref 140–400)
RBC: 4.65 MIL/uL (ref 4.20–5.80)
RDW: 13.6 % (ref 11.0–15.0)
WBC: 6.4 10*3/uL (ref 3.8–10.8)

## 2016-06-04 LAB — COMPLETE METABOLIC PANEL WITH GFR
ALT: 22 U/L (ref 9–46)
AST: 23 U/L (ref 10–40)
Albumin: 4.6 g/dL (ref 3.6–5.1)
Alkaline Phosphatase: 42 U/L (ref 40–115)
BUN: 15 mg/dL (ref 7–25)
CALCIUM: 9.8 mg/dL (ref 8.6–10.3)
CHLORIDE: 106 mmol/L (ref 98–110)
CO2: 25 mmol/L (ref 20–31)
CREATININE: 1.08 mg/dL (ref 0.60–1.35)
GFR, Est Non African American: 83 mL/min (ref >=60)
Glucose, Bld: 86 mg/dL (ref 65–99)
POTASSIUM: 4.9 mmol/L (ref 3.5–5.3)
Sodium: 141 mmol/L (ref 135–146)
Total Bilirubin: 0.7 mg/dL (ref 0.2–1.2)
Total Protein: 6.7 g/dL (ref 6.1–8.1)

## 2016-06-04 NOTE — Progress Notes (Addendum)
Pharmacy Note  Subjective:  Patient presents today to the [facility] to see Dr. Lacy Duverney. [place].  Patient is being switched from Enbrel to Cosentyx.  Advised patient that Cosentyx must be started at least once week after his last Enbrel injection.  Patient was seen by the pharmacist for counseling on Cosentyx.  Objective:  CBC    Component Value Date/Time   WBC 8.6 02/04/2016 1535   RBC 4.78 02/04/2016 1535   HGB 15.1 02/04/2016 1535   HCT 44.2 02/04/2016 1535   PLT 191 02/04/2016 1535   MCV 92.5 02/04/2016 1535   MCH 31.6 02/04/2016 1535   MCHC 34.2 02/04/2016 1535   RDW 14.0 02/04/2016 1535   LYMPHSABS 2,666 02/04/2016 1535   MONOABS 688 02/04/2016 1535   EOSABS 86 02/04/2016 1535   BASOSABS 0 02/04/2016 1535   CMP     Component Value Date/Time   NA 141 02/04/2016 1535   K 4.9 02/04/2016 1535   CL 105 02/04/2016 1535   CO2 28 02/04/2016 1535   GLUCOSE 79 02/04/2016 1535   BUN 20 02/04/2016 1535   CREATININE 1.00 03/24/2016 0758   CREATININE 1.51 (H) 02/04/2016 1535   CALCIUM 10.1 02/04/2016 1535   PROT 7.3 02/04/2016 1535   ALBUMIN 4.7 02/04/2016 1535   AST 15 02/04/2016 1535   ALT 13 02/04/2016 1535   ALKPHOS 43 02/04/2016 1535   BILITOT 0.7 02/04/2016 1535   GFRNONAA 56 (L) 02/04/2016 1535   GFRAA 64 02/04/2016 1535   TB Gold: negative (02/04/16) Hepatitis panel: negative (01/30/15) HIV: negative (03/07/15) SPEP: normal (01/30/15) Immunoglobulin: normal (01/30/15)  Assessment/Plan:  Counseled patient that Cosentyx is a IL-17 inhibitor that works to reduce pain and inflammation associated with arthritis.  Counseled patient on purpose, proper use, and adverse effects of Cosentyx. Reviewed the most common adverse effects of infection, inflammatory bowel disease, and allergic reaction.  Provided patient with medication education material and answered all questions.  Patient consented to Cosentyx.  Will upload consent into patients chart.  Will apply  for Cosentyx through patient's insurance.  Reviewed storage information for Cosentyx.  Advised patient that he will need a nursing visit for the initial Cosentyx injection.  Patient voiced understanding.    I assisted patient in enrolling in the cosentyx patient assistance program.  ID [id number], BIN [id number], PCN [id number], GRP [id number]/    Elisabeth Most, Pharm.D., BCPS Clinical Pharmacist Pager: [phone] Phone: [phone] 06/04/2016 4:38 PM

## 2016-06-04 NOTE — Telephone Encounter (Signed)
PA for Lidocaine patches completed via Cover My Meds. Key# [id number].  For dx. of Neuropathy right lateral femoral cutaneous nerve (G57.11).[name]

## 2016-06-04 NOTE — Patient Instructions (Signed)
Secukinumab injection What is this medicine? SECUKINUMAB (sek ue KIN ue mab) is used to treat psoriasis. It is also used to treat psoriatic arthritis and ankylosing spondylitis. This medicine may be used for other purposes; ask your health care provider or pharmacist if you have questions. COMMON BRAND NAME(S): Cosentyx What should I tell my health care provider before I take this medicine? They need to know if you have any of these conditions: -Crohn's disease, ulcerative colitis, or other inflammatory bowel disease -infection or history of infection -other conditions affecting the immune system -recently received or are scheduled to receive a vaccine -tuberculosis, a positive skin test for tuberculosis, or have recently been in close contact with someone who has tuberculosis -an unusual or allergic reaction to secukinumab, other medicines, latex, rubber, foods, dyes, or preservatives -pregnant or trying to get pregnant -breast-feeding How should I use this medicine? This medicine is for injection under the skin. It may be administered by a healthcare professional in a hospital or clinic setting or at home. If you get this medicine at home, you will be taught how to prepare and give this medicine. Use exactly as directed. Take your medicine at regular intervals. Do not take your medicine more often than directed. It is important that you put your used needles and syringes in a special sharps container. Do not put them in a trash can. If you do not have a sharps container, call your pharmacist or healthcare provider to get one. A special MedGuide will be given to you by the pharmacist with each prescription and refill. Be sure to read this information carefully each time. Talk to your pediatrician regarding the use of this medicine in children. Special care may be needed. Overdosage: If you think you have taken too much of this medicine contact a poison control center or emergency room at  once. NOTE: This medicine is only for you. Do not share this medicine with others. What if I miss a dose? It is important not to miss your dose. Call your doctor of health care professional if you are unable to keep an appointment. If you give yourself the medicine and you miss a dose, take it as soon as you can. If it is almost time for your next dose, take only that dose. Do not take double or extra doses. What may interact with this medicine? Do not take this medicine with any of the following medications: -live virus vaccines This medicine may also interact with the following medications: -cyclosporine -inactivated vaccines -warfarin This list may not describe all possible interactions. Give your health care provider a list of all the medicines, herbs, non-prescription drugs, or dietary supplements you use. Also tell them if you smoke, drink alcohol, or use illegal drugs. Some items may interact with your medicine. What should I watch for while using this medicine? Tell your doctor or healthcare professional if your symptoms do not start to get better or if they get worse. You will be tested for tuberculosis (TB) before you start this medicine. If your doctor prescribes any medicine for TB, you should start taking the TB medicine before starting this medicine. Make sure to finish the full course of TB medicine. Call your doctor or healthcare professional for advice if you get a fever, chills or sore throat, or other symptoms of a cold or flu. Do not treat yourself. This drug decreases your body's ability to fight infections. Try to avoid being around people who are sick. This medicine can decrease   the response to a vaccine. If you need to get vaccinated, tell your healthcare professional if you have received this medicine within the last 6 months. Extra booster doses may be needed. Talk to your doctor to see if a different vaccination schedule is needed. What side effects may I notice from  receiving this medicine? Side effects that you should report to your doctor or health care professional as soon as possible: -allergic reactions like skin rash, itching or hives, swelling of the face, lips, or tongue -signs and symptoms of infection like fever or chills; cough; sore throat; pain or trouble passing urine Side effects that usually do not require medical attention (report to your doctor or health care professional if they continue or are bothersome): -diarrhea This list may not describe all possible side effects. Call your doctor for medical advice about side effects. You may report side effects to FDA at 1-800-FDA-1088. Where should I keep my medicine? Keep out of the reach of children. Store the prefilled syringe or injection pen in a refrigerator between 2 to 8 degrees C (36 to 46 degrees F). Keep the syringe or the pen in the original carton until ready for use. Protect from light. Do not freeze. Do not shake. Prior to use, remove the syringe or pen from the refrigerator and use within 1 hour. Throw away any unused medicine after the expiration date on the label. NOTE: This sheet is a summary. It may not cover all possible information. If you have questions about this medicine, talk to your doctor, pharmacist, or health care provider.  2018 Elsevier/Gold Standard (2015-01-24 11:48:31)  

## 2016-06-05 ENCOUNTER — Telehealth: Payer: Self-pay

## 2016-06-05 NOTE — Telephone Encounter (Signed)
A prior authorization for Cosentyx was submitted via fax to [facility]. Will update once we receive a response.   Devaeh Amadi, [place], CPhT 2:32 PM

## 2016-06-08 ENCOUNTER — Telehealth: Payer: Self-pay | Admitting: Pharmacist

## 2016-06-08 ENCOUNTER — Telehealth: Payer: Self-pay | Admitting: *Deleted

## 2016-06-08 DIAGNOSIS — M6740 Ganglion, unspecified site: Secondary | ICD-10-CM

## 2016-06-08 MED ORDER — SECUKINUMAB 150 MG/ML ~~LOC~~ SOAJ: 300.0000 mg | SUBCUTANEOUS | 0 refills | Status: DC

## 2016-06-08 MED FILL — COSENTYX 300 MG DOSE-2 PENS: 150 | 28 days supply | Qty: 8 | Fill #0

## 2016-06-08 NOTE — Telephone Encounter (Signed)
-----   Message from Eliezer Lofts, [place] sent at 06/04/2016  5:07 PM EDT ----- Regarding: Refer patient to Dr. Burney Gauze at [facility] Refer patient to Dr. Burney Gauze at [facility] for evaluation and treatment for right volar wrist joint ganglion cyst.

## 2016-06-08 NOTE — Telephone Encounter (Signed)
Cosentyx prior authorization was approved from 06/05/16 to 12/05/16.  I have informed patient.  I have advised that initial fill must be delivered to clinic for clinic administration.    Last visit: 06/04/16 Next visit: 09/03/16 Labs: 06/04/16 CBC, CMP normal; 02/04/16 TB Gold negative  Ok to send in Cosentyx 300 mg loading dose?     Elisabeth Most, Pharm.D., BCPS, CPP Clinical Pharmacist Pager: [phone] Phone: [phone] 06/08/2016 1:50 PM

## 2016-06-08 NOTE — Telephone Encounter (Signed)
Referral placed.

## 2016-06-08 NOTE — Telephone Encounter (Signed)
Yes, IT IS Ok to send in Cosentyx 300 mg loading dose

## 2016-06-09 ENCOUNTER — Ambulatory Visit (HOSPITAL_COMMUNITY)
Admission: RE | Admit: 2016-06-09 | Discharge: 2016-06-09 | Disposition: A | Discharge status: Home / Self Care | Payer: 59 | Source: Ambulatory Visit | Attending: Family Medicine | Admitting: Family Medicine

## 2016-06-09 DIAGNOSIS — Z96642 Presence of left artificial hip joint: Secondary | ICD-10-CM | POA: Diagnosis not present

## 2016-06-09 DIAGNOSIS — R1032 Left lower quadrant pain: Secondary | ICD-10-CM

## 2016-06-09 DIAGNOSIS — K760 Fatty (change of) liver, not elsewhere classified: Secondary | ICD-10-CM | POA: Insufficient documentation

## 2016-06-09 DIAGNOSIS — M879 Osteonecrosis, unspecified: Secondary | ICD-10-CM | POA: Insufficient documentation

## 2016-06-09 MED ORDER — IOPAMIDOL (ISOVUE-300) INJECTION 61%
100.0000 mL | Freq: Once | INTRAVENOUS | Status: AC | PRN
  Administered 2016-06-09: 100 mL via INTRAVENOUS

## 2016-06-10 ENCOUNTER — Telehealth: Payer: Self-pay | Admitting: Pharmacist

## 2016-06-10 NOTE — Telephone Encounter (Signed)
Received patient's Cosentyx from the pharmacy.  I informed patient.  He reports he took his most recent Enbrel yesterday.  Patient will be able to start Cosentyx after next Tuesday.  Nurse visit scheduled for Wednesday 06/17/16 at 2:00 PM for initial Cosentyx injection.   Elisabeth Most, Pharm.D., BCPS, CPP Clinical Pharmacist Pager: [phone] Phone: [phone] 06/10/2016 2:24 PM

## 2016-06-11 NOTE — Telephone Encounter (Signed)
Fax received from [facility]. PA for Lidocaine Patches has been denied, as pt is not using them for postherpetic neuralgia./fim

## 2016-06-17 ENCOUNTER — Ambulatory Visit: Payer: 59 | Admitting: Radiology

## 2016-06-17 VITALS — BP 130/70 | HR 78

## 2016-06-17 DIAGNOSIS — Z79899 Other long term (current) drug therapy: Secondary | ICD-10-CM

## 2016-06-17 MED ORDER — SECUKINUMAB 150 MG/ML ~~LOC~~ SOAJ: 300.0000 mg | SUBCUTANEOUS | 2 refills | Status: DC

## 2016-06-17 MED ORDER — SECUKINUMAB 150 MG/ML ~~LOC~~ SOSY
300.0000 mg | PREFILLED_SYRINGE | Freq: Once | SUBCUTANEOUS | Status: AC
  Administered 2016-06-17: 300 mg via SUBCUTANEOUS

## 2016-06-17 NOTE — Patient Instructions (Addendum)
Take Cosentyx 300 mg dose weekly for five weeks (today, 06/24/16, 07/01/16, 07/08/16, 07/15/16) then every 4 weeks starting on 08/12/16.    Standing Labs We placed an order today for your standing lab work.    Please come back and get your standing labs in 1 month then every 2 months  We have open lab Monday through Friday from 8:30-11:30 AM and 1:30-4 PM at the office of Dr. Tresa Moore, PA.   The office is located at [address] [place], [place], [place] [address] No appointment is necessary.   Labs are drawn by [facility].  You may receive a bill from [facility] for your lab work. If you have any questions regarding directions or hours of operation,  please call [phone].    Secukinumab injection What is this medicine? SECUKINUMAB (sek ue KIN ue mab) is used to treat psoriasis. It is also used to treat psoriatic arthritis and ankylosing spondylitis. This medicine may be used for other purposes; ask your health care provider or pharmacist if you have questions. COMMON BRAND NAME(S): Cosentyx What should I tell my health care provider before I take this medicine? They need to know if you have any of these conditions: -Crohn's disease, ulcerative colitis, or other inflammatory bowel disease -infection or history of infection -other conditions affecting the immune system -recently received or are scheduled to receive a vaccine -tuberculosis, a positive skin test for tuberculosis, or have recently been in close contact with someone who has tuberculosis -an unusual or allergic reaction to secukinumab, other medicines, latex, rubber, foods, dyes, or preservatives -pregnant or trying to get pregnant -breast-feeding How should I use this medicine? This medicine is for injection under the skin. It may be administered by a healthcare professional in a hospital or clinic setting or at home. If you get this medicine at home, you will be taught how to prepare and give this  medicine. Use exactly as directed. Take your medicine at regular intervals. Do not take your medicine more often than directed. It is important that you put your used needles and syringes in a special sharps container. Do not put them in a trash can. If you do not have a sharps container, call your pharmacist or healthcare provider to get one. A special MedGuide will be given to you by the pharmacist with each prescription and refill. Be sure to read this information carefully each time. Talk to your pediatrician regarding the use of this medicine in children. Special care may be needed. Overdosage: If you think you have taken too much of this medicine contact a poison control center or emergency room at once. NOTE: This medicine is only for you. Do not share this medicine with others. What if I miss a dose? It is important not to miss your dose. Call your doctor of health care professional if you are unable to keep an appointment. If you give yourself the medicine and you miss a dose, take it as soon as you can. If it is almost time for your next dose, take only that dose. Do not take double or extra doses. What may interact with this medicine? Do not take this medicine with any of the following medications: -live virus vaccines This medicine may also interact with the following medications: -cyclosporine -inactivated vaccines -warfarin This list may not describe all possible interactions. Give your health care provider a list of all the medicines, herbs, non-prescription drugs, or dietary supplements you use. Also tell them if you smoke, drink  alcohol, or use illegal drugs. Some items may interact with your medicine. What should I watch for while using this medicine? Tell your doctor or healthcare professional if your symptoms do not start to get better or if they get worse. You will be tested for tuberculosis (TB) before you start this medicine. If your doctor prescribes any medicine for TB, you  should start taking the TB medicine before starting this medicine. Make sure to finish the full course of TB medicine. Call your doctor or healthcare professional for advice if you get a fever, chills or sore throat, or other symptoms of a cold or flu. Do not treat yourself. This drug decreases your body's ability to fight infections. Try to avoid being around people who are sick. This medicine can decrease the response to a vaccine. If you need to get vaccinated, tell your healthcare professional if you have received this medicine within the last 6 months. Extra booster doses may be needed. Talk to your doctor to see if a different vaccination schedule is needed. What side effects may I notice from receiving this medicine? Side effects that you should report to your doctor or health care professional as soon as possible: -allergic reactions like skin rash, itching or hives, swelling of the face, lips, or tongue -signs and symptoms of infection like fever or chills; cough; sore throat; pain or trouble passing urine Side effects that usually do not require medical attention (report to your doctor or health care professional if they continue or are bothersome): -diarrhea This list may not describe all possible side effects. Call your doctor for medical advice about side effects. You may report side effects to FDA at 1-800-FDA-1088. Where should I keep my medicine? Keep out of the reach of children. Store the prefilled syringe or injection pen in a refrigerator between 2 to 8 degrees C (36 to 46 degrees F). Keep the syringe or the pen in the original carton until ready for use. Protect from light. Do not freeze. Do not shake. Prior to use, remove the syringe or pen from the refrigerator and use within 1 hour. Throw away any unused medicine after the expiration date on the label. NOTE: This sheet is a summary. It may not cover all possible information. If you have questions about this medicine, talk to your  doctor, pharmacist, or health care provider.  2018 Elsevier/Gold Standard (2015-01-24 11:48:31)

## 2016-06-17 NOTE — Progress Notes (Signed)
Cosentyx 300 mg / 2 pens bilateral upper thighs. Patient tolerated well/ will monitor for 30. Minutes for reaction / his wife injected.  She gives him the injections at home.

## 2016-06-17 NOTE — Progress Notes (Signed)
Pharmacy Note  Subjective:   Patient is being initiated on Cosentyx.  Patient was previously counseled extensively on Cosentyx on 06/04/16 and consented to initiation of Cosentyx at that time.  Patient presents to clinic today to receive the first dose of Cosentyx.  He confirms his most recent Enbrel dose was more than 1 week ago.     Objective: CMP     Component Value Date/Time   NA 141 06/04/2016 1723   K 4.9 06/04/2016 1723   CL 106 06/04/2016 1723   CO2 25 06/04/2016 1723   GLUCOSE 86 06/04/2016 1723   BUN 15 06/04/2016 1723   CREATININE 1.08 06/04/2016 1723   CALCIUM 9.8 06/04/2016 1723   PROT 6.7 06/04/2016 1723   ALBUMIN 4.6 06/04/2016 1723   AST 23 06/04/2016 1723   ALT 22 06/04/2016 1723   ALKPHOS 42 06/04/2016 1723   BILITOT 0.7 06/04/2016 1723   GFRNONAA 83 06/04/2016 1723   GFRAA >89 06/04/2016 1723   CBC    Component Value Date/Time   WBC 6.4 06/04/2016 1723   RBC 4.65 06/04/2016 1723   HGB 15.0 06/04/2016 1723   HCT 43.3 06/04/2016 1723   PLT 162 06/04/2016 1723   MCV 93.1 06/04/2016 1723   MCH 32.3 06/04/2016 1723   MCHC 34.6 06/04/2016 1723   RDW 13.6 06/04/2016 1723   LYMPHSABS 2,240 06/04/2016 1723   MONOABS 512 06/04/2016 1723   EOSABS 192 06/04/2016 1723   BASOSABS 0 06/04/2016 1723   TB Gold: negative (02/04/16)  Assessment/Plan:  Patient was counseled on how to administer subcutaneous Cosentyx injection using a demonstration pen and received first dose.  Patient was monitored for 30 minutes post injection.  No injection site reaction noted.  Patient will need standing lab orders in one month.  Provided patient with standing lab instructions and placed standing lab order.  Patient has follow up scheduled on 09/03/16.    Elisabeth Most, Pharm.D., BCPS Clinical Pharmacist Pager: [phone] Phone: [phone] 06/17/2016 3:05 PM

## 2016-07-07 ENCOUNTER — Encounter: Payer: Self-pay | Admitting: Internal Medicine

## 2016-07-07 ENCOUNTER — Ambulatory Visit: Payer: 59 | Admitting: Internal Medicine

## 2016-07-07 VITALS — BP 135/81 | HR 82 | Temp 97.2°F | Ht 72.0 in | Wt 208.2 lb

## 2016-07-07 DIAGNOSIS — K219 Gastro-esophageal reflux disease without esophagitis: Secondary | ICD-10-CM

## 2016-07-07 DIAGNOSIS — K648 Other hemorrhoids: Secondary | ICD-10-CM

## 2016-07-07 NOTE — Patient Instructions (Signed)
Avoid straining.  Benefiber 1 tablespoon twice daily  Limit toilet time to 5 minutes  Call with any interim problems  Schedule followup appointment in 6 months

## 2016-07-07 NOTE — Progress Notes (Signed)
Primary Care Physician:  Lemmie Evens, MD Primary Gastroenterologist:  Dr. Gala Romney  Pre-Procedure History & Physical: HPI:  Charles [name] is a 46 y.o. male here for follow-up of bleeding hemorrhoids. Status post banding of the left lateral and right lateral right  columns. The right posterior area has not been banded as of yet. However, patient presents today stating that the itching burning and bleeding of all resolved.  He is quite pleased. He continues to avoid straining and takes a fiber supplement daily.  Reflux symptoms well controlled on Protonix 40 mg daily. No dysphagia.  Past Medical History:  Diagnosis Date  . Arthritis   . Chronic back pain   . Chronic neck pain   . Chronic shoulder pain   . COPD (chronic obstructive pulmonary disease) ([place])   . GERD (gastroesophageal reflux disease)    tums  OTC  medication  . Headache   . Hypothyroidism     Past Surgical History:  Procedure Laterality Date  . COLONOSCOPY WITH PROPOFOL N/A 12/09/2015   Procedure: COLONOSCOPY WITH PROPOFOL;  Surgeon: Daneil Dolin, MD;  [facility];  Service: Endoscopy;  Laterality: N/A;  1100  . ESOPHAGOGASTRODUODENOSCOPY (EGD) WITH PROPOFOL N/A 12/09/2015   Procedure: ESOPHAGOGASTRODUODENOSCOPY (EGD) WITH PROPOFOL;  Surgeon: Daneil Dolin, MD;  [facility];  Service: Endoscopy;  Laterality: N/A;  . NECK SURGERY    . SHOULDER SURGERY    . TOTAL HIP ARTHROPLASTY Left 07/30/2015   Procedure: TOTAL HIP ARTHROPLASTY;  Surgeon: Garald Balding, MD;  Location: [facility];  Service: Orthopedics;  Laterality: Left;    Prior to Admission medications   Medication Sig Start Date End Date Taking? Authorizing Provider  amitriptyline (ELAVIL) 25 MG tablet Take 2 tablets (50 mg total) by mouth at bedtime. Patient taking differently: Take 50 mg by mouth at bedtime as needed.  03/16/16  Yes Sater, Nanine Means, MD  levothyroxine (SYNTHROID, LEVOTHROID) 75 MCG tablet Take 75 mcg by mouth daily  before breakfast.  05/16/15  Yes [provider]  lidocaine (LIDODERM) 5 % Place 2 patches onto the skin daily. Remove & Discard patch after 12 hours or so a day 12/26/15  Yes Sater, Nanine Means, MD  oxyCODONE-acetaminophen (PERCOCET) 10-325 MG tablet Take 1 tablet by mouth 2 (two) times daily as needed for pain.  10/25/15  Yes [provider]  pantoprazole (PROTONIX) 40 MG tablet Take 40 mg by mouth daily. 11/05/15  Yes [provider]  Secukinumab (COSENTYX SENSOREADY 300 DOSE) 150 MG/ML SOAJ Inject 300 mg into the skin once a week. For week 0, 1, 2, 3, 4 06/08/16  Yes Panwala, Naitik, PA-C  hydrocortisone (ANUSOL-HC) 25 MG suppository Place 1 suppository (25 mg total) rectally every 12 (twelve) hours. Patient not taking: Reported on 04/14/2016 03/13/16   Carlis Stable, NP  lisinopril (PRINIVIL,ZESTRIL) 10 MG tablet Take 10 mg by mouth daily. 10/29/15   [provider]  Secukinumab (COSENTYX SENSOREADY 300 DOSE) 150 MG/ML SOAJ Inject 300 mg into the skin every 28 (twenty-eight) days. Patient not taking: Reported on 07/07/2016 06/17/16   Bo Merino, MD    Allergies as of 07/07/2016  . (No Known Allergies)    Family History  Problem Relation Age of Onset  . Heart failure Mother   . Diabetes Mother   . Hypertension Mother   . Heart failure Father   . Prostate cancer Father   . Colon cancer Neg Hx     Social History  Social History  . Marital status: Married    Spouse name: N/A  . Number of children: N/A  . Years of education: N/A   Occupational History  . Not on file.   Social History Main Topics  . Smoking status: Current Some Day Smoker    Packs/day: 0.50    Years: 25.00    Types: Cigarettes, E-cigarettes    Last attempt to quit: 05/17/2015  . Smokeless tobacco: Never Used     Comment: USES VAPOR CIGARETTES   . Alcohol use 3.6 oz/week    6 Cans of beer per week     Comment: twice a week: 6-7 drinks per occurrance  . Drug use: No      Comment: states quit  . Sexual activity: Not on file   Other Topics Concern  . Not on file   Social History Narrative  . No narrative on file    Review of Systems: See HPI, otherwise negative ROS  Physical Exam: BP 135/81   Pulse 82   Temp 97.2 F (36.2 C) (Oral)   Ht 6' (1.829 m)   Wt 208 lb 3.2 oz (94.4 kg)   BMI 28.24 kg/m  General:   Alert,  Well-developed, well-nourished, pleasant and cooperative in NAD    Impression:  Symptomatic grade 1 hemorrhoids much better after 2 banding sessions. No banding needed at this time. Although further banding may be useful in the future depending on any recurrence in symptoms.  GERD symptoms well controlled on Protonix 40 mg daily.  Recommendations:  Avoid straining.  Benefiber 1 tablespoon twice daily  Limit toilet time to 5 minutes  Call with any interim problems  Continue Protonix 40 mg daily.  Schedule followup appointment in 6 months     Notice: This dictation was prepared with Dragon dictation along with smaller phrase technology. Any transcriptional errors that result from this process are unintentional and may not be corrected upon review.

## 2016-07-13 ENCOUNTER — Telehealth: Payer: Self-pay | Admitting: Rheumatology

## 2016-07-13 NOTE — Telephone Encounter (Signed)
Patient provided with number for [place]

## 2016-07-13 NOTE — Telephone Encounter (Signed)
Returned Rachel's call

## 2016-07-13 NOTE — Telephone Encounter (Signed)
Patient advised he would need to contact [employer]. Patient unable to take down number at this time will call back to obtain number. Patient advised he will need to set up delivery as soon as possible as he is due for his fifth dose of Cosentyx this week. Patient advised if they are unable to deliver it by July 11 , 2018 to let us know.

## 2016-07-13 NOTE — Telephone Encounter (Signed)
Patient has questions about pharmacy whom medicine will be delivered thru, and who he needs to contact to set up delivery. Please call, and advise.

## 2016-07-14 ENCOUNTER — Telehealth (INDEPENDENT_AMBULATORY_CARE_PROVIDER_SITE_OTHER): Payer: Self-pay | Admitting: Rheumatology

## 2016-07-14 NOTE — Telephone Encounter (Signed)
06/04/2016 OV NOTE FAXED TO DR Wende Neighbors [phone]

## 2016-07-24 NOTE — Progress Notes (Deleted)
Office Visit Note  Patient: Charles [name]             Date of Birth: [birth year]           MRN: [id number]             PCP: Celene Squibb, MD Referring: Celene Squibb, MD Visit Date: 07/30/2016 Occupation: @GUAROCC @    Subjective:  No chief complaint on file.   History of Present Illness: Charles [name] is a 46 y.o. male ***   Activities of Daily Living:  Patient reports morning stiffness for *** {minute/hour:19697}.   Patient {ACTIONS;DENIES/REPORTS:21021675::"Denies"} nocturnal pain.  Difficulty dressing/grooming: {ACTIONS;DENIES/REPORTS:21021675::"Denies"} Difficulty climbing stairs: {ACTIONS;DENIES/REPORTS:21021675::"Denies"} Difficulty getting out of chair: {ACTIONS;DENIES/REPORTS:21021675::"Denies"} Difficulty using hands for taps, buttons, cutlery, and/or writing: {ACTIONS;DENIES/REPORTS:21021675::"Denies"}   No Rheumatology ROS completed.   PMFS History:  Patient Active Problem List   Diagnosis Date Noted  . Bilateral sacroiliitis ([place]) 05/22/2016  . Psoriatic arthritis ([place]) 05/22/2016  . High risk medications (not anticoagulants) long-term use 05/22/2016  . DJD (degenerative joint disease), cervical 05/22/2016  . Osteoarthritis of spine with radiculopathy, lumbar region 05/22/2016  . Cephalalgia 03/16/2016  . Chronic daily headache 03/16/2016  . Neck pain 03/16/2016  . Hemorrhoids 03/13/2016  . 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 Insomnia 12/26/2015  . Abdominal pain, epigastric 11/13/2015  . Dark stools 11/13/2015  . Rectal bleeding 11/13/2015  . Numbness 09/26/2015  . 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 Avascular necrosis of left femoral head ([place]) 07/30/2015  . Avascular necrosis of right femoral head ([place]) 07/30/2015  . Psoriasis 07/30/2015  . Chronic obstructive pulmonary disease (COPD) ([place]) 07/30/2015  . 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    Past Medical  History:  Diagnosis Date  . Arthritis   . Chronic back pain   . Chronic neck pain   . Chronic shoulder pain   . COPD (chronic obstructive pulmonary disease) ([place])   . GERD (gastroesophageal reflux disease)    tums  OTC  medication  . Headache   . Hypothyroidism     Family History  Problem Relation Age of Onset  . Heart failure Mother   . Diabetes Mother   . Hypertension Mother   . Heart failure Father   . Prostate cancer Father   . Colon cancer Neg Hx    Past Surgical History:  Procedure Laterality Date  . COLONOSCOPY WITH PROPOFOL N/A 12/09/2015   Procedure: COLONOSCOPY WITH PROPOFOL;  Surgeon: Daneil Dolin, MD;  [facility];  Service: Endoscopy;  Laterality: N/A;  1100  . ESOPHAGOGASTRODUODENOSCOPY (EGD) WITH PROPOFOL N/A 12/09/2015   Procedure: ESOPHAGOGASTRODUODENOSCOPY (EGD) WITH PROPOFOL;  Surgeon: Daneil Dolin, MD;  [facility];  Service: Endoscopy;  Laterality: N/A;  . NECK SURGERY    . SHOULDER SURGERY    . TOTAL HIP ARTHROPLASTY Left 07/30/2015   Procedure: TOTAL HIP ARTHROPLASTY;  Surgeon: Garald Balding, MD;  Location: [facility];  Service: Orthopedics;  Laterality: Left;   Social History   Social History Narrative  . No narrative on file     Objective: Vital Signs: There were no vitals taken for this visit.   Physical Exam   Musculoskeletal Exam: ***  CDAI Exam: No CDAI exam completed.    Investigation: Findings:  02/04/2016 negative TB gold    CBC Latest Ref Rng & Units 06/04/2016 02/04/2016 11/13/2015  WBC 3.8 - 10.8 K/uL 6.4 8.6 7.0  Hemoglobin 13.2 - 17.1 g/dL 15.0 15.1 15.5  Hematocrit 38.5 - 50.0 % 43.3 44.2 46.8  Platelets 140 - 400 K/uL 162 191 212    CMP Latest Ref Rng & Units 06/04/2016 03/24/2016 02/04/2016  Glucose 65 - 99 mg/dL 86 - 79  BUN 7 - 25 mg/dL 15 - 20  Creatinine 0.60 - 1.35 mg/dL 1.08 1.00 1.51(H)  Sodium 135 - 146 mmol/L 141 - 141  Potassium 3.5 - 5.3 mmol/L 4.9 - 4.9  Chloride 98 - 110 mmol/L  106 - 105  CO2 20 - 31 mmol/L 25 - 28  Calcium 8.6 - 10.3 mg/dL 9.8 - 10.1  Total Protein 6.1 - 8.1 g/dL 6.7 - 7.3  Total Bilirubin 0.2 - 1.2 mg/dL 0.7 - 0.7  Alkaline Phos 40 - 115 U/L 42 - 43  AST 10 - 40 U/L 23 - 15  ALT 9 - 46 U/L 22 - 13   Imaging: No results found.  Speciality Comments: No specialty comments available.    Procedures:  No procedures performed Allergies: Patient has no known allergies.   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  No orders of the defined types were placed in this encounter.   Face-to-face time spent with patient was *** minutes. 50% of time was spent in counseling and coordination of care.  Follow-Up Instructions: No Follow-up on file.   Lenin Kuhnle, RT  Note - This record has been created using [employer].  Chart creation errors have been sought, but may not always  have been located. Such creation errors do not reflect on  the standard of medical care.

## 2016-07-30 ENCOUNTER — Ambulatory Visit: Payer: 59 | Admitting: Rheumatology

## 2016-08-12 ENCOUNTER — Ambulatory Visit: Payer: 59 | Admitting: Rheumatology

## 2016-08-18 ENCOUNTER — Ambulatory Visit (INDEPENDENT_AMBULATORY_CARE_PROVIDER_SITE_OTHER): Payer: Self-pay

## 2016-08-18 ENCOUNTER — Ambulatory Visit (INDEPENDENT_AMBULATORY_CARE_PROVIDER_SITE_OTHER): Payer: 59 | Admitting: Orthopaedic Surgery

## 2016-08-18 ENCOUNTER — Ambulatory Visit (INDEPENDENT_AMBULATORY_CARE_PROVIDER_SITE_OTHER): Payer: 59

## 2016-08-18 ENCOUNTER — Encounter (INDEPENDENT_AMBULATORY_CARE_PROVIDER_SITE_OTHER): Payer: Self-pay | Admitting: Orthopaedic Surgery

## 2016-08-18 ENCOUNTER — Other Ambulatory Visit: Payer: Self-pay

## 2016-08-18 VITALS — BP 123/69 | HR 76 | Resp 14 | Ht 72.0 in | Wt 198.0 lb

## 2016-08-18 DIAGNOSIS — M87051 Idiopathic aseptic necrosis of right femur: Secondary | ICD-10-CM

## 2016-08-18 DIAGNOSIS — Z79899 Other long term (current) drug therapy: Secondary | ICD-10-CM

## 2016-08-18 LAB — CBC WITH DIFFERENTIAL/PLATELET
BASOS PCT: 0 %
Basophils Absolute: 0 cells/uL (ref 0–200)
EOS PCT: 2 %
Eosinophils Absolute: 134 cells/uL (ref 15–500)
HCT: 44.2 % (ref 38.5–50.0)
Hemoglobin: 15.5 g/dL (ref 13.2–17.1)
Lymphocytes Relative: 29 %
Lymphs Abs: 1943 cells/uL (ref 850–3900)
MCH: 33.3 pg — ABNORMAL HIGH (ref 27.0–33.0)
MCHC: 35.1 g/dL (ref 32.0–36.0)
MCV: 94.8 fL (ref 80.0–100.0)
MONO ABS: 536 {cells}/uL (ref 200–950)
MONOS PCT: 8 %
MPV: 12.2 fL (ref 7.5–12.5)
NEUTROS PCT: 61 %
Neutro Abs: 4087 cells/uL (ref 1500–7800)
PLATELETS: 202 10*3/uL (ref 140–400)
RBC: 4.66 MIL/uL (ref 4.20–5.80)
RDW: 13.2 % (ref 11.0–15.0)
WBC: 6.7 10*3/uL (ref 3.8–10.8)

## 2016-08-18 NOTE — Progress Notes (Addendum)
Office Visit Note   Patient: Charles [name]           Date of Birth: [birth year]           MRN: [id number] Visit Date: 08/18/2016              Requested by: Celene Squibb, MD [address] [place], [place] [address] PCP: Celene Squibb, MD   Assessment & Plan: Visit Diagnoses:  1. Avascular necrosis of bone of right hip (HCC)     Plan:  #1: We will have Dr. Ernestina Patches is still corticosteroid injection to the right hip. #2: We have filled out a surgery slip for him for a right total hip arthroplasty in the future. Charles [name] knows Charles [name] has to wait 2 months after the injection to consider a total hip replacement  Follow-Up Instructions: Return if symptoms worsen or fail to improve.   Orders:  Orders Placed This Encounter  Procedures  . XR HIP UNILAT W OR W/O PELVIS 2-3 VIEWS RIGHT  . Ambulatory referral to Physical Medicine Rehab   No orders of the defined types were placed in this encounter.     Procedures: No procedures performed   Clinical Data: No additional findings.   Subjective: Chief Complaint  Patient presents with  . Right Hip - Pain    Charles [name] is a 67 y o that presents with Right hip pain hx of Left THA 06/2015. Charles [name] walks with a slight limp, hip pain and stiffness.    Charles [name] is a very pleasant 46 year old white male was old patient of ours. Charles [name] did have a left total hip replacement for avascular necrosis of the femoral head. Charles [name] now complains of right hip pain very similar to what Charles [name] had prior to surgery.  Charles [name] has been previously seen with bilateral groin pain, left greater than right.  Charles [name] is a patient of Dr. Estanislado Pandy and Julien Nordmann, PA-C who was diagnosed with sacroiliitis and psoriasis as a child.  Charles [name] was placed on Enbrel.  Charles [name] has been noted radiographically by them that Charles [name] had developed avascular necrosis of the femoral heads.  An MRI dated back to November of 2016 revealed stage I bilateral AVN.  There was also a small bony infarct in the proximal right femur with  bilateral sacroiliitis noted.  Anterior/superior ligament tears of the right hip with a small paralabral cyst was also noted at that time. Charles [name] is now having worsening pain in his right groin and hip area. Charles [name] states that his left hip is doing very well and is very happy with his results at this time. Charles [name] is seen today for evaluation of his right hip.    Review of Systems  Constitutional: Positive for unexpected weight change. Negative for fatigue.  HENT: Negative for hearing loss.   Respiratory: Negative for apnea, chest tightness and shortness of breath.   Cardiovascular: Negative for chest pain, palpitations and leg swelling.  Gastrointestinal: Negative for blood in stool, constipation and diarrhea.  Genitourinary: Negative for difficulty urinating.  Musculoskeletal: Positive for back pain and joint swelling. Negative for arthralgias, myalgias, neck pain and neck stiffness.  Neurological: Positive for weakness. Negative for numbness and headaches.  Hematological: Does not bruise/bleed easily.  Psychiatric/Behavioral: Negative for sleep disturbance. The patient is not nervous/anxious.      Objective: Vital Signs: BP 123/69   Pulse 76   Resp 14   Ht 6' (1.829 m)   Wt 198 lb (89.8 kg)   BMI 26.85  kg/m   Physical Exam  Constitutional: Charles [name] is oriented to person, place, and time. Charles [name] appears well-developed and well-nourished.  HENT:  Head: Normocephalic and atraumatic.  Eyes: Pupils are equal, round, and reactive to light. EOM are normal.  Pulmonary/Chest: Effort normal.  Neurological: Charles [name] is alert and oriented to person, place, and time.  Skin: Skin is warm and dry.  Psychiatric: Charles [name] has a normal mood and affect. His behavior is normal. Judgment and thought content normal.    Ortho Exam  Charles [name] does have limited range of motion of the hip is about 30 of internal and external rotation with the hip in 90. No tenderness to palpation at this time the trochanteric region. Negative straight leg  raising.  Specialty Comments:  No specialty comments available.  Imaging: AP pelvis and right hip reveals the left total hip replacement to be in excellent position and alignment. Excellent contact of the femoral stem. The right hip reveals a cystic changes in the femoral head. This appears to be avascular necrosis. Also does have a shallow acetabulum    PMFS History: Patient Active Problem List   Diagnosis Date Noted  . Bilateral sacroiliitis ([place]) 05/22/2016  . Psoriatic arthritis ([place]) 05/22/2016  . High risk medications (not anticoagulants) long-term use 05/22/2016  . DJD (degenerative joint disease), cervical 05/22/2016  . Osteoarthritis of spine with radiculopathy, lumbar region 05/22/2016  . Cephalalgia 03/16/2016  . Chronic daily headache 03/16/2016  . Neck pain 03/16/2016  . Hemorrhoids 03/13/2016  . 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 Insomnia 12/26/2015  . Abdominal pain, epigastric 11/13/2015  . Dark stools 11/13/2015  . Rectal bleeding 11/13/2015  . Numbness 09/26/2015  . 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 Avascular necrosis of left femoral head ([place]) 07/30/2015  . Avascular necrosis of right femoral head ([facility] [place]) 07/30/2015  . Psoriasis 07/30/2015  . Chronic obstructive pulmonary disease (COPD) ([facility]) 07/30/2015  . 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   Past Medical History:  Diagnosis Date  . Arthritis    also has avascular necrosis   . Chronic back pain   . Chronic neck pain   . Chronic shoulder pain   . COPD (chronic obstructive pulmonary disease) ([facility])   . GERD (gastroesophageal reflux disease)    tums  OTC  medication  . Headache   . Hypothyroidism   . Sleep apnea    tested more than 5 yrs ago...negative results    Family History  Problem Relation Age of Onset  . Heart failure Mother   . Diabetes Mother   . Hypertension Mother   . Heart failure Father   .  Prostate cancer Father   . Colon cancer Neg Hx     Past Surgical History:  Procedure Laterality Date  . COLONOSCOPY WITH PROPOFOL N/A 12/09/2015   Procedure: COLONOSCOPY WITH PROPOFOL;  Surgeon: Daneil Dolin, MD;  [facility];  Service: Endoscopy;  Laterality: N/A;  1100  . ESOPHAGOGASTRODUODENOSCOPY (EGD) WITH PROPOFOL N/A 12/09/2015   Procedure: ESOPHAGOGASTRODUODENOSCOPY (EGD) WITH PROPOFOL;  Surgeon: Daneil Dolin, MD;  [facility];  Service: Endoscopy;  Laterality: N/A;  . JOINT REPLACEMENT     left hip 2017  . NECK SURGERY    . SHOULDER SURGERY    . TOTAL HIP ARTHROPLASTY Left 07/30/2015   Procedure: TOTAL HIP ARTHROPLASTY;  Surgeon: Garald Balding, MD;  Location: [facility];  Service: Orthopedics;  Laterality: Left;  Social History   Occupational History  . Not on file.   Social History Main Topics  . Smoking status: Current Some Day Smoker    Packs/day: 0.50    Years: 25.00    Types: Cigarettes, E-cigarettes    Last attempt to quit: 05/17/2015  . Smokeless tobacco: Never Used     Comment: USES VAPOR CIGARETTES   . Alcohol use 3.6 oz/week    6 Cans of beer per week     Comment: twice a week: 6-7 drinks per occurrance  . Drug use: No     Comment: states quit  . Sexual activity: Not on file

## 2016-08-19 ENCOUNTER — Telehealth (INDEPENDENT_AMBULATORY_CARE_PROVIDER_SITE_OTHER): Payer: Self-pay | Admitting: Orthopaedic Surgery

## 2016-08-19 LAB — COMPLETE METABOLIC PANEL WITH GFR
ALT: 11 U/L (ref 9–46)
AST: 15 U/L (ref 10–40)
Albumin: 4.6 g/dL (ref 3.6–5.1)
Alkaline Phosphatase: 53 U/L (ref 40–115)
BUN: 16 mg/dL (ref 7–25)
CALCIUM: 9.5 mg/dL (ref 8.6–10.3)
CHLORIDE: 105 mmol/L (ref 98–110)
CO2: 22 mmol/L (ref 20–32)
CREATININE: 0.93 mg/dL (ref 0.60–1.35)
GFR, Est African American: >89 mL/min (ref >=60)
GFR, Est Non African American: >89 mL/min (ref >=60)
GLUCOSE: 84 mg/dL (ref 65–99)
POTASSIUM: 4.3 mmol/L (ref 3.5–5.3)
SODIUM: 139 mmol/L (ref 135–146)
Total Bilirubin: 0.6 mg/dL (ref 0.2–1.2)
Total Protein: 7 g/dL (ref 6.1–8.1)

## 2016-08-19 NOTE — Telephone Encounter (Signed)
Patient has decided he would rather proceed with surgery instead of having an injection. Please call to advise patient.

## 2016-08-19 NOTE — Progress Notes (Signed)
WNL

## 2016-08-20 NOTE — Telephone Encounter (Signed)
Blue sheet to [employer]

## 2016-08-24 ENCOUNTER — Encounter (INDEPENDENT_AMBULATORY_CARE_PROVIDER_SITE_OTHER): Payer: Self-pay | Admitting: Orthopaedic Surgery

## 2016-08-31 ENCOUNTER — Other Ambulatory Visit (INDEPENDENT_AMBULATORY_CARE_PROVIDER_SITE_OTHER): Payer: Self-pay | Admitting: Orthopaedic Surgery

## 2016-09-03 ENCOUNTER — Ambulatory Visit: Payer: 59 | Admitting: Rheumatology

## 2016-09-09 ENCOUNTER — Ambulatory Visit (INDEPENDENT_AMBULATORY_CARE_PROVIDER_SITE_OTHER): Payer: 59 | Admitting: Orthopedic Surgery

## 2016-09-09 ENCOUNTER — Encounter (INDEPENDENT_AMBULATORY_CARE_PROVIDER_SITE_OTHER): Payer: Self-pay | Admitting: Orthopedic Surgery

## 2016-09-09 VITALS — BP 133/74 | HR 73 | Temp 98.7°F | Resp 16 | Ht 71.0 in | Wt 196.0 lb

## 2016-09-09 DIAGNOSIS — M87051 Idiopathic aseptic necrosis of right femur: Secondary | ICD-10-CM | POA: Diagnosis not present

## 2016-09-09 NOTE — H&P (Signed)
Joni Fears, MD   Biagio Borg, PA-C [address], [place], [place]  [address]                             [phone]   Charles [name] MRN:  [id number] DOB/SEX:  Aug 30, 1970/male  ORTHOPAEDIC HISTORY & PHYSICAL  CHIEF COMPLAINT:  Painful right Hip  HISTORY: Charles [name] a 46 y.o. male  Who has a history of pain and functional disability in the right hip(s) due to arthritis and avascular necrosis and patient has failed non-surgical conservative treatments for greater than 12 weeks to include NSAID's and/or analgesics, use of assistive devices and activity modification.  Onset of symptoms was gradual starting 4 years ago with gradually worsening course since that time.The patient noted no past surgery on the right hip(s).  Patient currently rates pain in the right hip at 8 out of 10 with activity. Patient has night pain, worsening of pain with activity and weight bearing, trendelenberg gait, pain that interfers with activities of daily living and pain with passive range of motion. Patient has evidence of subchondral cysts and subchondral sclerosis by imaging studies. This condition presents safety issues increasing the risk of falls. This patient has had avascular necrosis of the hip, acetabular fracture, hip dysplasia.  There is no current active infection.  PAST MEDICAL HISTORY: Patient Active Problem List   Diagnosis Date Noted  . Bilateral sacroiliitis ([facility]) 05/22/2016  . Psoriatic arthritis ([facility]) 05/22/2016  . High risk medications (not anticoagulants) long-term use 05/22/2016  . DJD (degenerative joint disease), cervical 05/22/2016  . Osteoarthritis of spine with radiculopathy, lumbar region 05/22/2016  . Cephalalgia 03/16/2016  . Chronic daily headache 03/16/2016  . Neck pain 03/16/2016  . Hemorrhoids 03/13/2016  . 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 Insomnia 12/26/2015  . Abdominal pain, epigastric 11/13/2015  . Dark stools  11/13/2015  . Rectal bleeding 11/13/2015  . Numbness 09/26/2015  . 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 Avascular necrosis of left femoral head ([facility]) 07/30/2015  . Avascular necrosis of right femoral head ([facility]) 07/30/2015  . Psoriasis 07/30/2015  . Chronic obstructive pulmonary disease (COPD) ([facility]) 07/30/2015  . 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   Past Medical History:  Diagnosis Date  . Arthritis   . Chronic back pain   . Chronic neck pain   . Chronic shoulder pain   . COPD (chronic obstructive pulmonary disease) ([facility])   . GERD (gastroesophageal reflux disease)    tums  OTC  medication  . Headache   . Hypothyroidism    Past Surgical History:  Procedure Laterality Date  . COLONOSCOPY WITH PROPOFOL N/A 12/09/2015   Procedure: COLONOSCOPY WITH PROPOFOL;  Surgeon: Daneil Dolin, MD;  [facility];  Service: Endoscopy;  Laterality: N/A;  1100  . ESOPHAGOGASTRODUODENOSCOPY (EGD) WITH PROPOFOL N/A 12/09/2015   Procedure: ESOPHAGOGASTRODUODENOSCOPY (EGD) WITH PROPOFOL;  Surgeon: Daneil Dolin, MD;  [facility];  Service: Endoscopy;  Laterality: N/A;  . NECK SURGERY    . SHOULDER SURGERY    . TOTAL HIP ARTHROPLASTY Left 07/30/2015   Procedure: TOTAL HIP ARTHROPLASTY;  Surgeon: Garald Balding, MD;  Location: [facility];  Service: Orthopedics;  Laterality: Left;     MEDICATIONS PRIOR TO ADMISSION: No current facility-administered medications for this encounter.   Current Outpatient Prescriptions:  .  cyclobenzaprine (FLEXERIL) 5 MG tablet,  Take 5 mg by mouth 3 (three) times daily as needed for muscle spasms. , Disp: , Rfl:  .  oxyCODONE-acetaminophen (PERCOCET) 10-325 MG tablet, Take 1 tablet by mouth 2 (two) times daily as needed for pain. , Disp: , Rfl: 0 .  pantoprazole (PROTONIX) 40 MG tablet, Take 40 mg by mouth daily., Disp: , Rfl: 0 .  Secukinumab (COSENTYX SENSOREADY 300 DOSE) 150 MG/ML SOAJ, Inject  300 mg into the skin once a week. For week 0, 1, 2, 3, 4 (Patient taking differently: Inject 300 mg into the skin every 30 (thirty) days. For week 0, 1, 2, 3,), Disp: 10 pen, Rfl: 0   ALLERGIES:  No Known Allergies  REVIEW OF SYSTEMS:  A 14-point is positive for shortness of breath mainly that ofdyspnea onexertion. He is a cigarette smoker. He had pneumonia as a child. Bronchitis several years ago. The last several years he was diagnosed with COPD. He does have a morning cough at times. He has had some chest pain, which was questioned as to whether this was GERD. He is hypothyroid and has been for the past year and is on replacement. He does have a history of headaches and migraines.  Review of Systems  All other systems reviewed and are negative.   FAMILY HISTORY:   Family History  Problem Relation Age of Onset  . Heart failure Mother   . Diabetes Mother   . Hypertension Mother   . Heart failure Father   . Prostate cancer Father   . Colon cancer Neg Hx     SOCIAL HISTORY:   Social History   Occupational History  . Not on file.   Social History Main Topics  . Smoking status: Current Some Day Smoker    Packs/day: 0.50    Years: 25.00    Types: Cigarettes, E-cigarettes    Last attempt to quit: 05/17/2015  . Smokeless tobacco: Never Used     Comment: USES VAPOR CIGARETTES   . Alcohol use 3.6 oz/week    6 Cans of beer per week     Comment: twice a week: 6-7 drinks per occurrance  . Drug use: No     Comment: states quit  . Sexual activity: Not on file     EXAMINATION:  Vital signs in last 24 hours: There were no vitals taken for this visit.  Physical Exam  Constitutional: He is oriented to person, place, and time. He appears well-developed and well-nourished.  HENT:  Head: Normocephalic and atraumatic.  Eyes: Pupils are equal, round, and reactive to light. EOM are normal.  Neck: Neck supple.  Cardiovascular: Normal rate, regular rhythm, normal heart sounds  and intact distal pulses.   No murmur heard. Pulmonary/Chest: Effort normal and breath sounds normal.  Abdominal: Soft. Bowel sounds are normal. He exhibits no mass. There is no tenderness.  Neurological: He is alert and oriented to person, place, and time.  Skin: Skin is warm and dry.  Psychiatric: He has a normal mood and affect. His behavior is normal. Judgment and thought content normal.   Ortho Exam  He does have limited range of motion of the hip is about 30 of internal and external rotation with the hip in 90. No tenderness to palpation at this time the trochanteric region. Negative straight leg raising.  Imaging Review Plain radiographs demonstrate moderate degenerative joint disease And AVN of the right hip. The bone quality appears to be good for age and reported activity level.  Assessment: AVN  right  Hip  Past Medical History:  Diagnosis Date  . Arthritis   . Chronic back pain   . Chronic neck pain   . Chronic shoulder pain   . COPD (chronic obstructive pulmonary disease) ([facility])   . GERD (gastroesophageal reflux disease)    tums  OTC  medication  . Headache   . Hypothyroidism     Plan: for right total hip replacement.  The patient history, physical examination, clinical judgement of the provider and imaging studies are consistent with end stage degenerative joint disease of the right hip(s) and total hip arthroplasty is deemed medically necessary. The treatment options including medical management, injection therapy, arthroscopy and arthroplasty were discussed at length. The risks and benefits of total hip arthroplasty were presented and reviewed. The risks due to aseptic loosening, infection, stiffness, dislocation/subluxation,  thromboembolic complications and other imponderables were discussed.  The patient acknowledged the explanation, agreed to proceed with the plan. The clearance notes recently received were reviewed and concurs with proceeding then surgical  intervention.  Patient is being admitted for inpatient treatment for surgery, pain control, PT, OT, prophylactic antibiotics, VTE prophylaxis, progressive ambulation and ADL's and discharge planning.The patient is planning to be discharged home with home health services   Mike Craze. [place], [place] [phone]  09/09/2016 3:14 PM

## 2016-09-09 NOTE — Progress Notes (Addendum)
Joni Fears, MD   Biagio Borg, PA-C [address] [place], [place]  [address]                             [phone]   DINO BORNTREGER MRN:  [id number] DOB/SEX:  1970/07/10/male  ORTHOPAEDIC HISTORY & PHYSICAL  CHIEF COMPLAINT:  Painful right Hip  HISTORY: Meshulem Onorato Normanis a 46 y.o. male  Who has a history of pain and functional disability in the right hip(s) due to arthritis and avascular necrosis and patient has failed non-surgical conservative treatments for greater than 12 weeks to include NSAID's and/or analgesics, use of assistive devices and activity modification.  Onset of symptoms was gradual starting 4 years ago with gradually worsening course since that time.The patient noted no past surgery on the right hip(s).  Patient currently rates pain in the right hip at 8 out of 10 with activity. Patient has night pain, worsening of pain with activity and weight bearing, trendelenberg gait, pain that interfers with activities of daily living and pain with passive range of motion. Patient has evidence of subchondral cysts and subchondral sclerosis by imaging studies. This condition presents safety issues increasing the risk of falls. This patient has had avascular necrosis of the hip, acetabular fracture, hip dysplasia.  There is no current active infection.  PAST MEDICAL HISTORY: Patient Active Problem List   Diagnosis Date Noted  . Bilateral sacroiliitis ([facility]) 05/22/2016  . Psoriatic arthritis ([facility]) 05/22/2016  . High risk medications (not anticoagulants) long-term use 05/22/2016  . DJD (degenerative joint disease), cervical 05/22/2016  . Osteoarthritis of spine with radiculopathy, lumbar region 05/22/2016  . Cephalalgia 03/16/2016  . Chronic daily headache 03/16/2016  . Neck pain 03/16/2016  . Hemorrhoids 03/13/2016  . 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 Insomnia 12/26/2015  . Abdominal pain, epigastric 11/13/2015  . Dark stools  11/13/2015  . Rectal bleeding 11/13/2015  . Numbness 09/26/2015  . 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 Avascular necrosis of left femoral head ([facility]) 07/30/2015  . Avascular necrosis of right femoral head ([facility]) 07/30/2015  . Psoriasis 07/30/2015  . Chronic obstructive pulmonary disease (COPD) ([facility]) 07/30/2015  . 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   Past Medical History:  Diagnosis Date  . Arthritis   . Chronic back pain   . Chronic neck pain   . Chronic shoulder pain   . COPD (chronic obstructive pulmonary disease) ([facility])   . GERD (gastroesophageal reflux disease)    tums  OTC  medication  . Headache   . Hypothyroidism    Past Surgical History:  Procedure Laterality Date  . COLONOSCOPY WITH PROPOFOL N/A 12/09/2015   Procedure: COLONOSCOPY WITH PROPOFOL;  Surgeon: Daneil Dolin, MD;  [facility];  Service: Endoscopy;  Laterality: N/A;  1100  . ESOPHAGOGASTRODUODENOSCOPY (EGD) WITH PROPOFOL N/A 12/09/2015   Procedure: ESOPHAGOGASTRODUODENOSCOPY (EGD) WITH PROPOFOL;  Surgeon: Daneil Dolin, MD;  [facility];  Service: Endoscopy;  Laterality: N/A;  . NECK SURGERY    . SHOULDER SURGERY    . TOTAL HIP ARTHROPLASTY Left 07/30/2015   Procedure: TOTAL HIP ARTHROPLASTY;  Surgeon: Garald Balding, MD;  Location: [facility];  Service: Orthopedics;  Laterality: Left;     MEDICATIONS PRIOR TO ADMISSION: No current facility-administered medications for this encounter.   Current Outpatient Prescriptions:  .  cyclobenzaprine (FLEXERIL) 5 MG tablet,  Take 5 mg by mouth 3 (three) times daily as needed for muscle spasms. , Disp: , Rfl:  .  oxyCODONE-acetaminophen (PERCOCET) 10-325 MG tablet, Take 1 tablet by mouth 2 (two) times daily as needed for pain. , Disp: , Rfl: 0 .  pantoprazole (PROTONIX) 40 MG tablet, Take 40 mg by mouth daily., Disp: , Rfl: 0 .  Secukinumab (COSENTYX SENSOREADY 300 DOSE) 150 MG/ML SOAJ, Inject  300 mg into the skin once a week. For week 0, 1, 2, 3, 4 (Patient taking differently: Inject 300 mg into the skin every 30 (thirty) days. For week 0, 1, 2, 3,), Disp: 10 pen, Rfl: 0   ALLERGIES:  No Known Allergies  REVIEW OF SYSTEMS:  A 14-point is positive for shortness of breath mainly that ofdyspnea onexertion. He is a cigarette smoker. He had pneumonia as a child. Bronchitis several years ago. The last several years he was diagnosed with COPD. He does have a morning cough at times. He has had some chest pain, which was questioned as to whether this was GERD. He is hypothyroid and has been for the past year and is on replacement. He does have a history of headaches and migraines.  Review of Systems  All other systems reviewed and are negative.   FAMILY HISTORY:   Family History  Problem Relation Age of Onset  . Heart failure Mother   . Diabetes Mother   . Hypertension Mother   . Heart failure Father   . Prostate cancer Father   . Colon cancer Neg Hx     SOCIAL HISTORY:   Social History   Occupational History  . Not on file.   Social History Main Topics  . Smoking status: Current Some Day Smoker    Packs/day: 0.50    Years: 25.00    Types: Cigarettes, E-cigarettes    Last attempt to quit: 05/17/2015  . Smokeless tobacco: Never Used     Comment: USES VAPOR CIGARETTES   . Alcohol use 3.6 oz/week    6 Cans of beer per week     Comment: twice a week: 6-7 drinks per occurrance  . Drug use: No     Comment: states quit  . Sexual activity: Not on file     EXAMINATION:  Vital signs in last 24 hours: There were no vitals taken for this visit.  Physical Exam  Constitutional: He is oriented to person, place, and time. He appears well-developed and well-nourished.  HENT:  Head: Normocephalic and atraumatic.  Eyes: Pupils are equal, round, and reactive to light. EOM are normal.  Neck: Neck supple.  Cardiovascular: Normal rate, regular rhythm, normal heart sounds  and intact distal pulses.   No murmur heard. Pulmonary/Chest: Effort normal and breath sounds normal.  Abdominal: Soft. Bowel sounds are normal. He exhibits no mass. There is no tenderness.  Neurological: He is alert and oriented to person, place, and time.  Skin: Skin is warm and dry.  Psychiatric: He has a normal mood and affect. His behavior is normal. Judgment and thought content normal.   Ortho Exam  He does have limited range of motion of the hip is about 30 of internal and external rotation with the hip in 90. No tenderness to palpation at this time the trochanteric region. Negative straight leg raising.  Imaging Review Plain radiographs demonstrate moderate degenerative joint disease And AVN of the right hip. The bone quality appears to be good for age and reported activity level.  Assessment: AVN  right  Hip  Past Medical History:  Diagnosis Date  . Arthritis   . Chronic back pain   . Chronic neck pain   . Chronic shoulder pain   . COPD (chronic obstructive pulmonary disease) ([facility])   . GERD (gastroesophageal reflux disease)    tums  OTC  medication  . Headache   . Hypothyroidism     Plan: for right total hip replacement.  The patient history, physical examination, clinical judgement of the provider and imaging studies are consistent with end stage degenerative joint disease of the right hip(s) and total hip arthroplasty is deemed medically necessary. The treatment options including medical management, injection therapy, arthroscopy and arthroplasty were discussed at length. The risks and benefits of total hip arthroplasty were presented and reviewed. The risks due to aseptic loosening, infection, stiffness, dislocation/subluxation,  thromboembolic complications and other imponderables were discussed.  The patient acknowledged the explanation, agreed to proceed with the plan. The clearance notes recently received were reviewed and concurs with proceeding then surgical  intervention.  Patient is being admitted for inpatient treatment for surgery, pain control, PT, OT, prophylactic antibiotics, VTE prophylaxis, progressive ambulation and ADL's and discharge planning.The patient is planning to be discharged home with home health services   Face-to-face with the patient was 50 minutes in total explained the procedure risks and benefits and Hospital course with him as well as examination.Face-to-face time spent with patient was greater than 50 minutes.  Greater than 50% of the time was spent in counseling and coordination of care.  Mike Craze [place], [place] [phone]  09/09/2016 3:14 PM

## 2016-09-09 NOTE — Pre-Procedure Instructions (Signed)
Charles [name]  09/09/2016      [facility] - [place], [place] - [address] [place] [address] Phone: [phone] Fax: [phone]  [place], [place] - [place] [address] Phone: [phone] Fax: [place], [place] [address] [place] [address] Phone: [phone] Fax: [place], [place] [address] Phone: [phone] Fax: [phone]    Your procedure is scheduled on September 18  Report to [facility] at [id number] A.M.  Call this number if you have problems the morning of surgery:  [phone]   Remember:  Do not eat food or drink liquids after midnight.  Continue all other medications as directed by your physician except follow these medication instructions before surgery   Take these medicines the morning of surgery with A SIP OF WATER cyclobenzaprine (FLEXERIL), oxyCODONE-acetaminophen (PERCOCET), pantoprazole (PROTONIX),   Continue all other medications as directed by your physician except follow these medication instructions before surgery    Do not wear jewelry  Do not wear lotions, powders, or cologne, or deoderant.  Men may shave face and neck.  Do not bring valuables to the hospital.  [facility] is not responsible for any belongings or valuables.  Contacts, dentures or bridgework may not be worn into surgery.  Leave your suitcase in the car.  After surgery it may be brought to your room.  For patients admitted to the hospital, discharge time will be determined by your treatment team.  Patients discharged the day of surgery will not be allowed to drive home.   Special instructions:   [facility]- Preparing For Surgery  Before surgery, you can play an important role. Because skin is  not sterile, your skin needs to be as free of germs as possible. You can reduce the number of germs on your skin by washing with CHG (chlorahexidine gluconate) Soap before surgery.  CHG is an antiseptic cleaner which kills germs and bonds with the skin to continue killing germs even after washing.  Please do not use if you have an allergy to CHG or antibacterial soaps. If your skin becomes reddened/irritated stop using the CHG.  Do not shave (including legs and underarms) for at least 48 hours prior to first CHG shower. It is OK to shave your face.  Please follow these instructions carefully.   1. Shower the NIGHT BEFORE SURGERY and the MORNING OF SURGERY with CHG.   2. If you chose to wash your hair, wash your hair first as usual with your normal shampoo.  3. After you shampoo, rinse your hair and body thoroughly to remove the shampoo.  4. Use CHG as you would any other liquid soap. You can apply CHG directly to the skin and wash gently with a scrungie or a clean washcloth.   5. Apply the CHG Soap to your body ONLY FROM THE NECK DOWN.  Do not use on open wounds or open sores. Avoid contact with your eyes, ears, mouth and genitals (private parts). Wash genitals (private parts) with your normal soap.  6. Wash thoroughly, paying special attention to the area where your surgery will be performed.  7. Thoroughly rinse your body with warm water from the neck down.  8. DO NOT shower/wash with your normal soap after  using and rinsing off the CHG Soap.  9. Pat yourself dry with a CLEAN TOWEL.   10. Wear CLEAN PAJAMAS   11. Place CLEAN SHEETS on your bed the night of your first shower and DO NOT SLEEP WITH PETS.    Day of Surgery: Do not apply any deodorants/lotions. Please wear clean clothes to the hospital/surgery center.      Please read over the following fact sheets that you were given.

## 2016-09-10 ENCOUNTER — Encounter (HOSPITAL_COMMUNITY)
Admission: RE | Admit: 2016-09-10 | Discharge: 2016-09-10 | Disposition: A | Discharge status: Home / Self Care | Payer: 59 | Source: Ambulatory Visit | Attending: Orthopaedic Surgery | Admitting: Orthopaedic Surgery

## 2016-09-10 ENCOUNTER — Encounter (HOSPITAL_COMMUNITY): Payer: Self-pay

## 2016-09-10 ENCOUNTER — Encounter (HOSPITAL_COMMUNITY)
Admission: RE | Admit: 2016-09-10 | Discharge: 2016-09-10 | Disposition: A | Discharge status: Home / Self Care | Payer: 59 | Source: Ambulatory Visit | Attending: Orthopedic Surgery | Admitting: Orthopedic Surgery

## 2016-09-10 DIAGNOSIS — Z01810 Encounter for preprocedural cardiovascular examination: Secondary | ICD-10-CM | POA: Diagnosis not present

## 2016-09-10 DIAGNOSIS — Z01818 Encounter for other preprocedural examination: Secondary | ICD-10-CM

## 2016-09-10 DIAGNOSIS — I498 Other specified cardiac arrhythmias: Secondary | ICD-10-CM | POA: Insufficient documentation

## 2016-09-10 DIAGNOSIS — M87051 Idiopathic aseptic necrosis of right femur: Secondary | ICD-10-CM | POA: Insufficient documentation

## 2016-09-10 HISTORY — DX: Sleep apnea, unspecified: G47.30

## 2016-09-10 LAB — COMPREHENSIVE METABOLIC PANEL
ALT: 21 U/L (ref 17–63)
AST: 25 U/L (ref 15–41)
Albumin: 4.4 g/dL (ref 3.5–5.0)
Alkaline Phosphatase: 44 U/L (ref 38–126)
Anion gap: 11 (ref 5–15)
BILIRUBIN TOTAL: 0.9 mg/dL (ref 0.3–1.2)
BUN: 13 mg/dL (ref 6–20)
CHLORIDE: 105 mmol/L (ref 101–111)
CO2: 23 mmol/L (ref 22–32)
CREATININE: 1 mg/dL (ref 0.61–1.24)
Calcium: 9.5 mg/dL (ref 8.9–10.3)
Glucose, Bld: 98 mg/dL (ref 65–99)
POTASSIUM: 4 mmol/L (ref 3.5–5.1)
Sodium: 139 mmol/L (ref 135–145)
TOTAL PROTEIN: 7 g/dL (ref 6.5–8.1)

## 2016-09-10 LAB — CBC WITH DIFFERENTIAL/PLATELET
Basophils Absolute: 0 10*3/uL (ref 0.0–0.1)
Basophils Relative: 1 %
EOS PCT: 3 %
Eosinophils Absolute: 0.2 10*3/uL (ref 0.0–0.7)
HCT: 46.9 % (ref 39.0–52.0)
Hemoglobin: 16.3 g/dL (ref 13.0–17.0)
LYMPHS ABS: 1.9 10*3/uL (ref 0.7–4.0)
LYMPHS PCT: 30 %
MCH: 33.1 pg (ref 26.0–34.0)
MCHC: 34.8 g/dL (ref 30.0–36.0)
MCV: 95.1 fL (ref 78.0–100.0)
MONO ABS: 0.5 10*3/uL (ref 0.1–1.0)
MONOS PCT: 7 %
Neutro Abs: 3.8 10*3/uL (ref 1.7–7.7)
Neutrophils Relative %: 59 %
PLATELETS: 143 10*3/uL — AB (ref 150–400)
RBC: 4.93 MIL/uL (ref 4.22–5.81)
RDW: 12.5 % (ref 11.5–15.5)
WBC: 6.4 10*3/uL (ref 4.0–10.5)

## 2016-09-10 LAB — URINALYSIS, ROUTINE W REFLEX MICROSCOPIC
BILIRUBIN URINE: NEGATIVE
GLUCOSE, UA: NEGATIVE mg/dL
Hgb urine dipstick: NEGATIVE
Ketones, ur: NEGATIVE mg/dL
LEUKOCYTES UA: NEGATIVE
Nitrite: NEGATIVE
PH: 5 (ref 5.0–8.0)
Protein, ur: NEGATIVE mg/dL
SPECIFIC GRAVITY, URINE: 1.025 (ref 1.005–1.030)

## 2016-09-10 LAB — SURGICAL PCR SCREEN
MRSA, PCR: NEGATIVE
Staphylococcus aureus: NEGATIVE

## 2016-09-10 LAB — APTT: APTT: 29 s (ref 24–36)

## 2016-09-10 LAB — PROTIME-INR
INR: 0.91
PROTHROMBIN TIME: 12.2 s (ref 11.4–15.2)

## 2016-09-10 LAB — TYPE AND SCREEN
ABO/RH(D): A POS
ANTIBODY SCREEN: NEGATIVE

## 2016-09-10 NOTE — Progress Notes (Addendum)
PCP is Dr. Delphina Cahill  LOV 07/2016 Rheumatologist is Dr. Arlean Hopping LOV 05/2016 Did have Echo done back in 2011 for "chest pains" - "indigestion" no further episodes. No further follow ups.  Denies murmur. Dr. 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  Dr. Durward Fortes has already repaired his left hip. Was tested for OSA about 5 yrs ago -- negative results.

## 2016-09-11 LAB — URINE CULTURE
CULTURE: NO GROWTH
SPECIAL REQUESTS: NORMAL

## 2016-09-21 MED ORDER — TRANEXAMIC ACID 1000 MG/10ML IV SOLN
2000.0000 mg | INTRAVENOUS | Status: AC
  Administered 2016-09-22: 2000 mg via TOPICAL
  Filled 2016-09-21: qty 20

## 2016-09-22 ENCOUNTER — Encounter (HOSPITAL_COMMUNITY): Payer: Self-pay | Admitting: Certified Registered Nurse Anesthetist

## 2016-09-22 ENCOUNTER — Encounter (HOSPITAL_COMMUNITY)
Admission: RE | Disposition: A | Discharge status: Home Health | Payer: Self-pay | Source: Ambulatory Visit | Attending: Orthopaedic Surgery

## 2016-09-22 ENCOUNTER — Inpatient Hospital Stay (HOSPITAL_COMMUNITY)
Admission: RE | Admit: 2016-09-22 | Discharge: 2016-09-24 | DRG: 470 | Disposition: A | Discharge status: Home Health | Payer: 59 | Source: Ambulatory Visit | Attending: Orthopaedic Surgery | Admitting: Orthopaedic Surgery

## 2016-09-22 ENCOUNTER — Inpatient Hospital Stay (HOSPITAL_COMMUNITY): Payer: 59 | Admitting: Certified Registered Nurse Anesthetist

## 2016-09-22 ENCOUNTER — Inpatient Hospital Stay (HOSPITAL_COMMUNITY): Payer: 59

## 2016-09-22 DIAGNOSIS — Z23 Encounter for immunization: Secondary | ICD-10-CM | POA: Diagnosis not present

## 2016-09-22 DIAGNOSIS — E039 Hypothyroidism, unspecified: Secondary | ICD-10-CM | POA: Diagnosis present

## 2016-09-22 DIAGNOSIS — Z8249 Family history of ischemic heart disease and other diseases of the circulatory system: Secondary | ICD-10-CM

## 2016-09-22 DIAGNOSIS — M87051 Idiopathic aseptic necrosis of right femur: Secondary | ICD-10-CM | POA: Diagnosis not present

## 2016-09-22 DIAGNOSIS — Z833 Family history of diabetes mellitus: Secondary | ICD-10-CM

## 2016-09-22 DIAGNOSIS — M879 Osteonecrosis, unspecified: Principal | ICD-10-CM | POA: Diagnosis present

## 2016-09-22 DIAGNOSIS — Z96642 Presence of left artificial hip joint: Secondary | ICD-10-CM | POA: Diagnosis present

## 2016-09-22 DIAGNOSIS — F17210 Nicotine dependence, cigarettes, uncomplicated: Secondary | ICD-10-CM | POA: Diagnosis present

## 2016-09-22 DIAGNOSIS — K219 Gastro-esophageal reflux disease without esophagitis: Secondary | ICD-10-CM | POA: Diagnosis present

## 2016-09-22 DIAGNOSIS — Z9889 Other specified postprocedural states: Secondary | ICD-10-CM

## 2016-09-22 DIAGNOSIS — M25551 Pain in right hip: Secondary | ICD-10-CM | POA: Diagnosis present

## 2016-09-22 DIAGNOSIS — J449 Chronic obstructive pulmonary disease, unspecified: Secondary | ICD-10-CM | POA: Diagnosis present

## 2016-09-22 DIAGNOSIS — Z96649 Presence of unspecified artificial hip joint: Secondary | ICD-10-CM

## 2016-09-22 HISTORY — PX: TOTAL HIP ARTHROPLASTY: SHX124

## 2016-09-22 SURGERY — ARTHROPLASTY, HIP, TOTAL,POSTERIOR APPROACH
Anesthesia: Spinal | Site: Hip | Laterality: Right

## 2016-09-22 MED ORDER — SUGAMMADEX SODIUM 200 MG/2ML IV SOLN
INTRAVENOUS | Status: AC
  Filled 2016-09-22: qty 2

## 2016-09-22 MED ORDER — KETOROLAC TROMETHAMINE 15 MG/ML IJ SOLN
15.0000 mg | Freq: Four times a day (QID) | INTRAMUSCULAR | Status: AC
  Administered 2016-09-22 – 2016-09-23 (×4): 15 mg via INTRAVENOUS
  Filled 2016-09-22 (×4): qty 1

## 2016-09-22 MED ORDER — HYDROMORPHONE HCL 2 MG PO TABS
2.0000 mg | ORAL_TABLET | ORAL | Status: DC | PRN
  Administered 2016-09-23 (×3): 4 mg via ORAL
  Filled 2016-09-22 (×3): qty 2

## 2016-09-22 MED ORDER — METOCLOPRAMIDE HCL 5 MG/ML IJ SOLN: 5.0000 mg | Freq: Three times a day (TID) | INTRAMUSCULAR | Status: DC | PRN

## 2016-09-22 MED ORDER — CEFAZOLIN SODIUM-DEXTROSE 2-4 GM/100ML-% IV SOLN
2.0000 g | Freq: Four times a day (QID) | INTRAVENOUS | Status: AC
  Administered 2016-09-22 – 2016-09-23 (×2): 2 g via INTRAVENOUS
  Filled 2016-09-22 (×4): qty 100

## 2016-09-22 MED ORDER — LIDOCAINE 2% (20 MG/ML) 5 ML SYRINGE
INTRAMUSCULAR | Status: AC
  Filled 2016-09-22: qty 10

## 2016-09-22 MED ORDER — LACTATED RINGERS IV SOLN
INTRAVENOUS | Status: DC
  Administered 2016-09-22: 09:00:00 via INTRAVENOUS

## 2016-09-22 MED ORDER — HYDROMORPHONE HCL 1 MG/ML IJ SOLN
0.2500 mg | INTRAMUSCULAR | Status: DC | PRN
  Administered 2016-09-22 (×2): 0.5 mg via INTRAVENOUS

## 2016-09-22 MED ORDER — ONDANSETRON HCL 4 MG/2ML IJ SOLN
INTRAMUSCULAR | Status: AC
  Filled 2016-09-22: qty 2

## 2016-09-22 MED ORDER — HYDROMORPHONE HCL 1 MG/ML IJ SOLN
INTRAMUSCULAR | Status: AC
  Filled 2016-09-22: qty 1

## 2016-09-22 MED ORDER — MEPERIDINE HCL 25 MG/ML IJ SOLN: 6.2500 mg | INTRAMUSCULAR | Status: DC | PRN

## 2016-09-22 MED ORDER — CEFAZOLIN SODIUM-DEXTROSE 2-4 GM/100ML-% IV SOLN
2.0000 g | INTRAVENOUS | Status: AC
  Administered 2016-09-22: 2 g via INTRAVENOUS
  Filled 2016-09-22: qty 100

## 2016-09-22 MED ORDER — ROCURONIUM BROMIDE 10 MG/ML (PF) SYRINGE
PREFILLED_SYRINGE | INTRAVENOUS | Status: AC
  Filled 2016-09-22: qty 10

## 2016-09-22 MED ORDER — BUPIVACAINE-EPINEPHRINE (PF) 0.25% -1:200000 IJ SOLN
INTRAMUSCULAR | Status: DC | PRN
  Administered 2016-09-22: 30 mL

## 2016-09-22 MED ORDER — RIVAROXABAN 10 MG PO TABS
10.0000 mg | ORAL_TABLET | Freq: Every day | ORAL | Status: DC
  Administered 2016-09-23 – 2016-09-24 (×2): 10 mg via ORAL
  Filled 2016-09-22 (×2): qty 1

## 2016-09-22 MED ORDER — HYDROMORPHONE HCL 1 MG/ML IJ SOLN
0.5000 mg | INTRAMUSCULAR | Status: DC | PRN
  Administered 2016-09-22 – 2016-09-24 (×13): 1 mg via INTRAVENOUS
  Filled 2016-09-22 (×13): qty 1

## 2016-09-22 MED ORDER — PHENYLEPHRINE 40 MCG/ML (10ML) SYRINGE FOR IV PUSH (FOR BLOOD PRESSURE SUPPORT)
PREFILLED_SYRINGE | INTRAVENOUS | Status: AC
  Filled 2016-09-22: qty 10

## 2016-09-22 MED ORDER — ONDANSETRON HCL 4 MG PO TABS: 4.0000 mg | ORAL_TABLET | Freq: Four times a day (QID) | ORAL | Status: DC | PRN

## 2016-09-22 MED ORDER — BUPIVACAINE-EPINEPHRINE (PF) 0.25% -1:200000 IJ SOLN
INTRAMUSCULAR | Status: AC
  Filled 2016-09-22: qty 30

## 2016-09-22 MED ORDER — PROMETHAZINE HCL 25 MG/ML IJ SOLN: 6.2500 mg | INTRAMUSCULAR | Status: DC | PRN

## 2016-09-22 MED ORDER — CHLORHEXIDINE GLUCONATE 4 % EX LIQD: 60.0000 mL | Freq: Once | CUTANEOUS | Status: DC

## 2016-09-22 MED ORDER — PROPOFOL 500 MG/50ML IV EMUL
INTRAVENOUS | Status: DC | PRN
  Administered 2016-09-22: 100 ug/kg/min via INTRAVENOUS

## 2016-09-22 MED ORDER — PANTOPRAZOLE SODIUM 40 MG PO TBEC
40.0000 mg | DELAYED_RELEASE_TABLET | Freq: Every day | ORAL | Status: DC
  Administered 2016-09-22 – 2016-09-24 (×4): 40 mg via ORAL
  Filled 2016-09-22 (×3): qty 1

## 2016-09-22 MED ORDER — MIDAZOLAM HCL 2 MG/2ML IJ SOLN: 0.5000 mg | Freq: Once | INTRAMUSCULAR | Status: DC | PRN

## 2016-09-22 MED ORDER — DOCUSATE SODIUM 100 MG PO CAPS
100.0000 mg | ORAL_CAPSULE | Freq: Two times a day (BID) | ORAL | Status: DC
  Administered 2016-09-22 – 2016-09-24 (×4): 100 mg via ORAL
  Filled 2016-09-22 (×4): qty 1

## 2016-09-22 MED ORDER — METHOCARBAMOL 500 MG PO TABS
ORAL_TABLET | ORAL | Status: AC
  Filled 2016-09-22: qty 1

## 2016-09-22 MED ORDER — EPHEDRINE 5 MG/ML INJ
INTRAVENOUS | Status: AC
  Filled 2016-09-22: qty 10

## 2016-09-22 MED ORDER — ONDANSETRON HCL 4 MG/2ML IJ SOLN: 4.0000 mg | Freq: Four times a day (QID) | INTRAMUSCULAR | Status: DC | PRN

## 2016-09-22 MED ORDER — MENTHOL 3 MG MT LOZG: 1.0000 | LOZENGE | OROMUCOSAL | Status: DC | PRN

## 2016-09-22 MED ORDER — HYDROMORPHONE HCL 1 MG/ML IJ SOLN
INTRAMUSCULAR | Status: AC
  Administered 2016-09-22: 0.5 mg via INTRAVENOUS
  Filled 2016-09-22: qty 1

## 2016-09-22 MED ORDER — POLYETHYLENE GLYCOL 3350 17 G PO PACK: 17.0000 g | PACK | Freq: Every day | ORAL | Status: DC | PRN

## 2016-09-22 MED ORDER — PHENOL 1.4 % MT LIQD: 1.0000 | OROMUCOSAL | Status: DC | PRN

## 2016-09-22 MED ORDER — PROPOFOL 10 MG/ML IV BOLUS
INTRAVENOUS | Status: DC | PRN
  Administered 2016-09-22: 30 mg via INTRAVENOUS

## 2016-09-22 MED ORDER — KETOROLAC TROMETHAMINE 15 MG/ML IJ SOLN
INTRAMUSCULAR | Status: AC
  Administered 2016-09-22: 15 mg
  Filled 2016-09-22: qty 1

## 2016-09-22 MED ORDER — SODIUM CHLORIDE 0.9 % IV SOLN
75.0000 mL/h | INTRAVENOUS | Status: DC
  Administered 2016-09-22: 75 mL/h via INTRAVENOUS

## 2016-09-22 MED ORDER — PROPOFOL 10 MG/ML IV BOLUS
INTRAVENOUS | Status: AC
  Filled 2016-09-22: qty 20

## 2016-09-22 MED ORDER — PHENYLEPHRINE HCL 10 MG/ML IJ SOLN
INTRAMUSCULAR | Status: DC | PRN
  Administered 2016-09-22: 20 ug/min via INTRAVENOUS

## 2016-09-22 MED ORDER — BUPIVACAINE IN DEXTROSE 0.75-8.25 % IT SOLN
INTRATHECAL | Status: DC | PRN
  Administered 2016-09-22: 15 mg via INTRATHECAL

## 2016-09-22 MED ORDER — METHOCARBAMOL 500 MG PO TABS
500.0000 mg | ORAL_TABLET | Freq: Four times a day (QID) | ORAL | Status: DC | PRN
Start: 2016-09-22 — End: 2016-09-24
  Administered 2016-09-22 – 2016-09-24 (×8): 500 mg via ORAL
  Filled 2016-09-22 (×7): qty 1

## 2016-09-22 MED ORDER — FENTANYL CITRATE (PF) 100 MCG/2ML IJ SOLN
INTRAMUSCULAR | Status: DC | PRN
  Administered 2016-09-22 (×5): 50 ug via INTRAVENOUS

## 2016-09-22 MED ORDER — DIPHENHYDRAMINE HCL 12.5 MG/5ML PO ELIX: 12.5000 mg | ORAL_SOLUTION | ORAL | Status: DC | PRN

## 2016-09-22 MED ORDER — BISACODYL 10 MG RE SUPP: 10.0000 mg | Freq: Every day | RECTAL | Status: DC | PRN

## 2016-09-22 MED ORDER — DEXAMETHASONE SODIUM PHOSPHATE 10 MG/ML IJ SOLN
INTRAMUSCULAR | Status: AC
  Filled 2016-09-22: qty 1

## 2016-09-22 MED ORDER — ACETAMINOPHEN 10 MG/ML IV SOLN
1000.0000 mg | Freq: Once | INTRAVENOUS | Status: AC
  Administered 2016-09-22: 1000 mg via INTRAVENOUS
  Filled 2016-09-22: qty 100

## 2016-09-22 MED ORDER — MIDAZOLAM HCL 2 MG/2ML IJ SOLN
INTRAMUSCULAR | Status: AC
  Filled 2016-09-22: qty 2

## 2016-09-22 MED ORDER — SODIUM CHLORIDE 0.9 % IV SOLN: 75.0000 mL/h | INTRAVENOUS | Status: DC

## 2016-09-22 MED ORDER — METHOCARBAMOL 1000 MG/10ML IJ SOLN
500.0000 mg | Freq: Four times a day (QID) | INTRAVENOUS | Status: DC | PRN
  Filled 2016-09-22: qty 5

## 2016-09-22 MED ORDER — MAGNESIUM CITRATE PO SOLN: 1.0000 | Freq: Once | ORAL | Status: DC | PRN

## 2016-09-22 MED ORDER — ACETAMINOPHEN 10 MG/ML IV SOLN
1000.0000 mg | Freq: Four times a day (QID) | INTRAVENOUS | Status: AC
  Administered 2016-09-22 – 2016-09-23 (×3): 1000 mg via INTRAVENOUS
  Filled 2016-09-22 (×3): qty 100

## 2016-09-22 MED ORDER — METOCLOPRAMIDE HCL 5 MG PO TABS: 5.0000 mg | ORAL_TABLET | Freq: Three times a day (TID) | ORAL | Status: DC | PRN

## 2016-09-22 MED ORDER — ALUM & MAG HYDROXIDE-SIMETH 200-200-20 MG/5ML PO SUSP: 30.0000 mL | ORAL | Status: DC | PRN

## 2016-09-22 MED ORDER — PHENYLEPHRINE 40 MCG/ML (10ML) SYRINGE FOR IV PUSH (FOR BLOOD PRESSURE SUPPORT)
PREFILLED_SYRINGE | INTRAVENOUS | Status: DC | PRN
  Administered 2016-09-22 (×2): 80 ug via INTRAVENOUS

## 2016-09-22 MED ORDER — SODIUM CHLORIDE 0.9 % IR SOLN
Status: DC | PRN
  Administered 2016-09-22: 1000 mL

## 2016-09-22 MED ORDER — PNEUMOCOCCAL VAC POLYVALENT 25 MCG/0.5ML IJ INJ
0.5000 mL | INJECTION | INTRAMUSCULAR | Status: AC
  Administered 2016-09-23: 0.5 mL via INTRAMUSCULAR
  Filled 2016-09-22: qty 0.5

## 2016-09-22 MED ORDER — FENTANYL CITRATE (PF) 250 MCG/5ML IJ SOLN
INTRAMUSCULAR | Status: AC
  Filled 2016-09-22: qty 5

## 2016-09-22 MED ORDER — MIDAZOLAM HCL 5 MG/5ML IJ SOLN
INTRAMUSCULAR | Status: DC | PRN
  Administered 2016-09-22: 2 mg via INTRAVENOUS

## 2016-09-22 SURGICAL SUPPLY — 61 items
BLADE SAW SAG 73X25 THK (BLADE) ×2
BLADE SAW SGTL 73X25 THK (BLADE) ×1 IMPLANT
BLADE SURG 10 STRL SS (BLADE) ×2 IMPLANT
BRUSH FEMORAL CANAL (MISCELLANEOUS) IMPLANT
CAPT HIP TOTAL 2 ×2 IMPLANT
COVER SURGICAL LIGHT HANDLE (MISCELLANEOUS) ×3 IMPLANT
DRAPE INCISE IOBAN 66X45 STRL (DRAPES) IMPLANT
DRAPE ORTHO SPLIT 77X108 STRL (DRAPES) ×6
DRAPE SURG ORHT 6 SPLT 77X108 (DRAPES) ×2 IMPLANT
DRESSING AQUACEL AG SP 3.5X10 (GAUZE/BANDAGES/DRESSINGS) IMPLANT
DRSG AQUACEL AG SP 3.5X10 (GAUZE/BANDAGES/DRESSINGS) ×3
DRSG MEPILEX BORDER 4X12 (GAUZE/BANDAGES/DRESSINGS) ×3 IMPLANT
DURAPREP 26ML APPLICATOR (WOUND CARE) ×6 IMPLANT
ELECT BLADE 4.0 EZ CLEAN MEGAD (MISCELLANEOUS) ×3
ELECT BLADE 6.5 EXT (BLADE) IMPLANT
ELECT REM PT RETURN 9FT ADLT (ELECTROSURGICAL) ×3
ELECTRODE BLDE 4.0 EZ CLN MEGD (MISCELLANEOUS) IMPLANT
ELECTRODE REM PT RTRN 9FT ADLT (ELECTROSURGICAL) ×1 IMPLANT
EVACUATOR 1/8 PVC DRAIN (DRAIN) IMPLANT
FACESHIELD WRAPAROUND (MASK) ×9 IMPLANT
FACESHIELD WRAPAROUND OR TEAM (MASK) ×3 IMPLANT
GLOVE BIOGEL PI IND STRL 8 (GLOVE) ×2 IMPLANT
GLOVE BIOGEL PI IND STRL 8.5 (GLOVE) ×1 IMPLANT
GLOVE BIOGEL PI INDICATOR 8 (GLOVE) ×4
GLOVE BIOGEL PI INDICATOR 8.5 (GLOVE) ×2
GLOVE ECLIPSE 8.0 STRL XLNG CF (GLOVE) ×6 IMPLANT
GLOVE SURG ORTHO 8.5 STRL (GLOVE) ×6 IMPLANT
GOWN STRL REUS W/ TWL LRG LVL3 (GOWN DISPOSABLE) ×2 IMPLANT
GOWN STRL REUS W/TWL 2XL LVL3 (GOWN DISPOSABLE) ×6 IMPLANT
GOWN STRL REUS W/TWL LRG LVL3 (GOWN DISPOSABLE) ×6
HANDPIECE INTERPULSE COAX TIP (DISPOSABLE)
IMMOBILIZER KNEE 22 UNIV (SOFTGOODS) ×3 IMPLANT
KIT BASIN OR (CUSTOM PROCEDURE TRAY) ×3 IMPLANT
KIT ROOM TURNOVER OR (KITS) ×3 IMPLANT
MANIFOLD NEPTUNE II (INSTRUMENTS) ×3 IMPLANT
NEEDLE 22X1 1/2 (OR ONLY) (NEEDLE) ×3 IMPLANT
NS IRRIG 1000ML POUR BTL (IV SOLUTION) ×3 IMPLANT
PACK TOTAL JOINT (CUSTOM PROCEDURE TRAY) ×3 IMPLANT
PAD ARMBOARD 7.5X6 YLW CONV (MISCELLANEOUS) ×3 IMPLANT
PRESSURIZER FEMORAL UNIV (MISCELLANEOUS) IMPLANT
SET HNDPC FAN SPRY TIP SCT (DISPOSABLE) IMPLANT
STAPLER VISISTAT 35W (STAPLE) IMPLANT
SUCTION FRAZIER HANDLE 10FR (MISCELLANEOUS) ×2
SUCTION TUBE FRAZIER 10FR DISP (MISCELLANEOUS) ×1 IMPLANT
SUT BONE WAX W31G (SUTURE) IMPLANT
SUT ETHIBOND NAB CT1 #1 30IN (SUTURE) ×9 IMPLANT
SUT MNCRL AB 3-0 PS2 18 (SUTURE) ×3 IMPLANT
SUT VIC AB 0 CT1 27 (SUTURE) ×6
SUT VIC AB 0 CT1 27XBRD ANBCTR (SUTURE) ×2 IMPLANT
SUT VIC AB 1 CT1 27 (SUTURE) ×6
SUT VIC AB 1 CT1 27XBRD ANBCTR (SUTURE) ×2 IMPLANT
SUT VIC AB 2-0 CT1 27 (SUTURE) ×6
SUT VIC AB 2-0 CT1 TAPERPNT 27 (SUTURE) ×1 IMPLANT
SYR CONTROL 10ML LL (SYRINGE) ×3 IMPLANT
TOWEL OR 17X24 6PK STRL BLUE (TOWEL DISPOSABLE) ×3 IMPLANT
TOWEL OR 17X26 10 PK STRL BLUE (TOWEL DISPOSABLE) ×3 IMPLANT
TOWER CARTRIDGE SMART MIX (DISPOSABLE) IMPLANT
TRAY CATH 16FR W/PLASTIC CATH (SET/KITS/TRAYS/PACK) IMPLANT
WATER STERILE IRR 1000ML POUR (IV SOLUTION) ×12 IMPLANT
WRAP KNEE MAXI GEL POST OP (GAUZE/BANDAGES/DRESSINGS) ×3 IMPLANT
YANKAUER SUCT BULB TIP NO VENT (SUCTIONS) ×2 IMPLANT

## 2016-09-22 NOTE — Evaluation (Signed)
Physical Therapy Evaluation Patient Details Name: Charles [name] MRN: [id number] DOB: Aug 17, 1970 Today's Date: 09/22/2016   History of Present Illness  Pt is a 46 y/o male s/p elective R THA, posterior hip precautions. PMH includes psoriatic arthritis, COPD, chronic back pain, chronic neck pain, and s/p L THA.   Clinical Impression  Pt is s/p surgery above with deficits below. PTA, pt was independent with functional mobility with use of cane. Upon eval, pt very limited by pain and only able to tolerate stand pivot to chair. RN aware and gave pain meds at end of session. Required min to min guard assist for mobility this session. Reports wife will be able to assist as needed upon d/c home and has all necessary DME. Follow up recommendations per MD arrangements. Will continue to follow acutely to maximize functional mobility independence and safety.     Follow Up Recommendations DC plan and follow up therapy as arranged by surgeon;Supervision for mobility/OOB    Equipment Recommendations  None recommended by PT (Has all necessary DME )    Recommendations for Other Services OT consult     Precautions / Restrictions Precautions Precautions: Posterior Hip Precaution Booklet Issued: Yes (comment) Precaution Comments: Reviewed posterior hip precautions. Verbally reviewed exercise handout. Unable to perform exercises secondary to pain.  Required Braces or Orthoses: Knee Immobilizer - Right Knee Immobilizer - Right: Other (comment) (until discontinued. ) Restrictions Weight Bearing Restrictions: Yes RLE Weight Bearing: Weight bearing as tolerated      Mobility  Bed Mobility Overal bed mobility: Needs Assistance Bed Mobility: Supine to Sit     Supine to sit: Min assist     General bed mobility comments: Min A for RLE management.   Transfers Overall transfer level: Needs assistance Equipment used: Rolling walker (2 wheeled) Transfers: Sit to/from [facility] Sit to  Stand: Min assist Stand pivot transfers: Min guard       General transfer comment: Min A for lift assist and steadying. Verbal cues for hand placement. Only able to tolerate stand pivot transfer to chair this session and required verbal cues for sequencing.   Ambulation/Gait             General Gait Details: Able to take a few steps to chair during stand pivot, however, further mobility deferred secondary to increased pain.   Stairs            Wheelchair Mobility    Modified Rankin (Stroke Patients Only)       Balance Overall balance assessment: Needs assistance Sitting-balance support: No upper extremity supported;Feet supported Sitting balance-[name]     Standing balance support: Bilateral upper extremity supported;During functional activity Standing balance-Leahy Scale: Poor Standing balance comment: Reliant on RW for stability.                              Pertinent Vitals/Pain Pain Assessment: 0-10 Pain Score: 10-Worst pain ever Pain Location: R hip  Pain Descriptors / Indicators: Aching;Grimacing;Operative site guarding Pain Intervention(s): Limited activity within patient's tolerance;Monitored during session;Repositioned;Patient requesting pain meds-RN notified;RN gave pain meds during session    [place] expects to be discharged to:: Private residence Living Arrangements: Spouse/significant other Available Help at Discharge: Family;Available 24 hours/day Type of Home: House Home Access: Stairs to enter Entrance Stairs-Rails: None Entrance Stairs-Number of Steps: 1 (threshold step ) Home Layout: One level Home Equipment: Walker - 2 wheels;Cane - single point;Bedside commode  Prior Function Level of Independence: Independent with assistive device(s)         Comments: Using cane for ambulation secondary to pain      Hand Dominance        Extremity/Trunk Assessment   Upper Extremity  Assessment Upper Extremity Assessment: Defer to OT evaluation    Lower Extremity Assessment Lower Extremity Assessment: RLE deficits/detail RLE Deficits / Details: Deficits consistent with post op pain and weakness. Reported numbness in R foot.     Cervical / Trunk Assessment Cervical / Trunk Assessment: Normal  Communication   Communication: No difficulties  Cognition Arousal/Alertness: Awake/alert Behavior During Therapy: WFL for tasks assessed/performed Overall Cognitive Status: Within Functional Limits for tasks assessed                                        General Comments General comments (skin integrity, edema, etc.): Pt's wife present during session.     Exercises     Assessment/Plan    PT Assessment Patient needs continued PT services  PT Problem List Decreased range of motion;Decreased strength;Decreased knowledge of precautions;Decreased knowledge of use of DME;Decreased mobility;Decreased activity tolerance;Decreased balance;Decreased coordination;Pain       PT Treatment Interventions DME instruction;Gait training;Stair training;Functional mobility training;Therapeutic activities;Therapeutic exercise;Balance training;Neuromuscular re-education;Patient/family education    PT Goals (Current goals can be found in the Care Plan section)  Acute Rehab PT Goals Patient Stated Goal: to decrease pain  PT Goal Formulation: With patient Time For Goal Achievement: 09/29/16 Potential to Achieve Goals: Good    Frequency 7X/week   Barriers to discharge        Co-evaluation               AM-PAC PT "6 Clicks" Daily Activity  Outcome Measure Difficulty turning over in bed (including adjusting bedclothes, sheets and blankets)?: A Little Difficulty moving from lying on back to sitting on the side of the bed? : Unable Difficulty sitting down on and standing up from a chair with arms (e.g., wheelchair, bedside commode, etc,.)?: Unable Help needed  moving to and from a bed to chair (including a wheelchair)?: A Little Help needed walking in hospital room?: A Little Help needed climbing 3-5 steps with a railing? : A Lot 6 Click Score: 13    End of Session Equipment Utilized During Treatment: Gait belt;Right knee immobilizer Activity Tolerance: Patient limited by pain Patient left: in chair;with call bell/phone within reach Nurse Communication: Mobility status;Patient requests pain meds PT Visit Diagnosis: Other abnormalities of gait and mobility (R26.89);Pain Pain - Right/Left: Right Pain - part of body: Hip    Time: 4270-6237 PT Time Calculation (min) (ACUTE ONLY): 17 min   Charges:   PT Evaluation $PT Eval Moderate Complexity: 1 Mod     PT G Codes:        Leighton Ruff, PT, DPT  Acute Rehabilitation Services  Pager: [phone]   Rudean Hitt 09/22/2016, 5:41 PM

## 2016-09-22 NOTE — Op Note (Signed)
PATIENT ID:      Charles [name]  MRN:     [id number] DOB/AGE:    1970-05-08 / 46 y.o.       OPERATIVE REPORT    DATE OF PROCEDURE:  09/22/2016       PREOPERATIVE DIAGNOSIS:   RIGHT HIP AVASCULAR NECROSIS                                                       Estimated body mass index is 27.06 kg/m as calculated from the following:   Height as of this encounter: 5\' 11"  (1.803 m).   Weight as of this encounter: 194 lb (88 kg).     POSTOPERATIVE DIAGNOSIS:   RIGHT HIP AVASCULAR NECROSIS                                                                     Estimated body mass index is 27.06 kg/m as calculated from the following:   Height as of this encounter: 5\' 11"  (1.803 m).   Weight as of this encounter: 194 lb (88 kg).     PROCEDURE:  Procedure(s): RIGHT TOTAL HIP ARTHROPLASTY     SURGEON:  Joni Fears, MD    ASSISTANT:   Biagio Borg, PA-C   (Present and scrubbed throughout the case, critical for assistance with exposure, retraction, instrumentation, and closure.)          ANESTHESIA: spinal and IV sedation     DRAINS: none :      TOURNIQUET TIME: * No tourniquets in log *    COMPLICATIONS:  None   CONDITION:  stable  PROCEDURE IN [id number]   Charles [name] 09/22/2016, 12:57 PM

## 2016-09-22 NOTE — Anesthesia Postprocedure Evaluation (Signed)
Anesthesia Post Note  Patient: Charles [name]  Procedure(s) Performed: Procedure(s) (LRB): RIGHT TOTAL HIP ARTHROPLASTY (Right)     Patient location during evaluation: PACU Anesthesia Type: Spinal Level of consciousness: awake and alert, patient cooperative and oriented Pain management: pain level controlled Vital Signs Assessment: post-procedure vital signs reviewed and stable Respiratory status: nonlabored ventilation, respiratory function stable and spontaneous breathing Cardiovascular status: blood pressure returned to baseline and stable Postop Assessment: patient able to bend at knees and spinal receding Anesthetic complications: no    Last Vitals:  Vitals:   09/22/16 1445 09/22/16 1500  BP: 120/69 125/69  Pulse: (!) 46 (!) 45  Resp: 15 20  Temp: 36.5 C   SpO2: 100% 97%    Last Pain:  Vitals:   09/22/16 1445  TempSrc:   PainSc: 4                  Rayner Erman,E. Natalie Mceuen

## 2016-09-22 NOTE — H&P (Signed)
The recent History & Physical has been reviewed. I have personally examined the patient today. There is no interval change to the documented History & Physical. The patient would like to proceed with the procedure.  Garald Balding 09/22/2016,  10:25 AM

## 2016-09-22 NOTE — Anesthesia Preprocedure Evaluation (Addendum)
Anesthesia Evaluation  Patient identified by MRN, date of birth, ID band Patient awake    Reviewed: Allergy & Precautions, NPO status , Patient's Chart, lab work & pertinent test results  History of Anesthesia Complications Negative for: history of anesthetic complications  Airway Mallampati: I  TM Distance: >3 FB Neck ROM: Full    Dental  (+) Dental Advisory Given, Missing   Pulmonary COPD, Current Smoker,    breath sounds clear to auscultation       Cardiovascular hypertension (no meds presently),  Rhythm:Regular Rate:Normal  '11 ECHO: EF 60%, valves OK   Neuro/Psych  Headaches,    GI/Hepatic Neg liver ROS, GERD  Medicated and Controlled,  Endo/Other  Hypothyroidism   Renal/GU negative Renal ROS     Musculoskeletal  (+) Arthritis ,   Abdominal   Peds  Hematology negative hematology ROS (+)   Anesthesia Other Findings   Reproductive/Obstetrics                            Anesthesia Physical Anesthesia Plan  ASA: II  Anesthesia Plan: Spinal   Post-op Pain Management:    Induction:   PONV Risk Score and Plan: 0  Airway Management Planned: Natural Airway and Simple Face Mask  Additional Equipment:   Intra-op Plan:   Post-operative Plan:   Informed Consent: I have reviewed the patients History and Physical, chart, labs and discussed the procedure including the risks, benefits and alternatives for the proposed anesthesia with the patient or authorized representative who has indicated his/her understanding and acceptance.   Dental advisory given  Plan Discussed with: CRNA and Surgeon  Anesthesia Plan Comments: (Plan routine monitors, SAB)        Anesthesia Quick Evaluation

## 2016-09-22 NOTE — Transfer of Care (Signed)
Immediate Anesthesia Transfer of Care Note  Patient: Charles [name]  Procedure(s) Performed: Procedure(s): RIGHT TOTAL HIP ARTHROPLASTY (Right)  Patient Location: PACU  Anesthesia Type:MAC and Spinal  Level of Consciousness: awake, alert , oriented and patient cooperative  Airway & Oxygen Therapy: Patient Spontanous Breathing  Post-op Assessment: Report given to RN, Post -op Vital signs reviewed and stable and Patient moving all extremities X 4  Post vital signs: Reviewed and stable  Last Vitals:  Vitals:   09/22/16 0822  BP: (!) 143/79  Pulse: 74  Resp: 18  Temp: 36.8 C  SpO2: 98%    Last Pain:  Vitals:   09/22/16 0822  TempSrc: Oral  PainSc:       Patients Stated Pain Goal: 3 (20/03/79 4446)  Complications: No apparent anesthesia complications

## 2016-09-23 ENCOUNTER — Encounter (HOSPITAL_COMMUNITY): Payer: Self-pay | Admitting: Orthopaedic Surgery

## 2016-09-23 LAB — CBC
HEMATOCRIT: 37.5 % — AB (ref 39.0–52.0)
HEMOGLOBIN: 12.5 g/dL — AB (ref 13.0–17.0)
MCH: 31.8 pg (ref 26.0–34.0)
MCHC: 33.3 g/dL (ref 30.0–36.0)
MCV: 95.4 fL (ref 78.0–100.0)
PLATELETS: 131 10*3/uL — AB (ref 150–400)
RBC: 3.93 MIL/uL — AB (ref 4.22–5.81)
RDW: 12.9 % (ref 11.5–15.5)
WBC: 8.1 10*3/uL (ref 4.0–10.5)

## 2016-09-23 LAB — BASIC METABOLIC PANEL
ANION GAP: 5 (ref 5–15)
BUN: 15 mg/dL (ref 6–20)
CALCIUM: 8.4 mg/dL — AB (ref 8.9–10.3)
CO2: 25 mmol/L (ref 22–32)
Chloride: 106 mmol/L (ref 101–111)
Creatinine, Ser: 1.05 mg/dL (ref 0.61–1.24)
Glucose, Bld: 100 mg/dL — ABNORMAL HIGH (ref 65–99)
POTASSIUM: 4 mmol/L (ref 3.5–5.1)
SODIUM: 136 mmol/L (ref 135–145)

## 2016-09-23 MED ORDER — OXYCODONE-ACETAMINOPHEN 5-325 MG PO TABS
1.0000 | ORAL_TABLET | ORAL | Status: DC | PRN
  Administered 2016-09-24 (×4): 2 via ORAL
  Filled 2016-09-23 (×4): qty 2

## 2016-09-23 MED ORDER — INFLUENZA VAC SPLIT QUAD 0.5 ML IM SUSY: 0.5000 mL | PREFILLED_SYRINGE | INTRAMUSCULAR | Status: DC

## 2016-09-23 NOTE — Op Note (Signed)
NAME:  Charles [name], Charles [name]                      ACCOUNT NO.:  MEDICAL RECORD NO.:  [id number]  LOCATION:                                 FACILITY:  PHYSICIAN:  Vonna Kotyk. Soniya Ashraf, M.D.DATE OF BIRTH:  02-May-1970  DATE OF PROCEDURE:  09/22/2016 DATE OF DISCHARGE:                              OPERATIVE REPORT   PREOPERATIVE DIAGNOSIS:  Avascular necrosis, right hip.  POSTOPERATIVE DIAGNOSIS:  Avascular necrosis, right hip.  PROCEDURE:  Right total hip replacement.  SURGEON:  Vonna Kotyk. Durward Fortes, M.D.  ASSISTANT:  Aaron Edelman D. Petrarca, P.A.-C.  ANESTHESIA:  Spinal with IV sedation.  COMPLICATIONS:  None.  COMPONENTS:  DePuy AML small-stature 13.5-mm femoral component, a 36-mm outer-diameter ceramic femoral head with a +5 neck length, 54-mm outer- diameter Gription 3 acetabular component with an apex hole eliminator and a Marathon polyethylene liner +4 with a 10-degree posterior lip. Components were press-fit.  DESCRIPTION OF PROCEDURE:  Charles [name] was met in the holding area, identified the right hip was appropriate operative site and marked it accordingly.  He was then transported to room #7 and carefully placed under spinal anesthesia per Anesthesia.  He was placed in the lateral decubitus position with the right side up with IV sedation.  He was then secured to the operating table with the Innomed hip system.  Foley catheter was inserted by the nursing staff prior to positioning.  Urine was clear.  The right hip was then prepped with chlorhexidine scrub and DuraPrep x2 from iliac crest to the ankle.  Sterile draping was performed.  Time-out was called.  Ancef IV was administered.  Time-out was called again.  A routine Southern incision was utilized and via sharp dissection, carried down to the subcutaneous tissue via sharp dissection.  Incision was carried down to the subcutaneous tissue and gross bleeders were Bovie coagulated.  IT band was identified and incised.  At that point,  I manually dissected the gluteal muscle through an anatomic cleavage and self-retaining retractors were inserted.  At that point, I could visualize the short external rotators.  I did palpate the sciatic nerve throughout the procedure and we felt that it was well out of harm's way. Short external rotators were carefully incised.  Tendinous structures were tagged with 0 Ethibond suture.  Capsule was identified and incised along the femoral neck and head.  About a 3-4 mL clear yellow joint effusion was identified and evacuated.  The head was then dislocated posteriorly.  There appeared to be a ping-pong effect at the very superior aspect of the head consistent with avascular necrosis that was identified by prior MRI scan.  Head was then amputated with an oscillating saw using the calcar guide.  Retractors were then carefully placed around the proximal femur.  The femoral neck cut was about a fingerbreadth proximal to the lesser trochanter.  A starter hole was then made in the piriformis fossa.  I inserted the canal finder and then reamed to 12.5 mm to accept a 13.5 component.  Rasping was performed sequentially from a 10.5 to a 12 to a 13.5 small stature with a very nice fit on the calcar.  Retractors were  then carefully placed about the acetabulum.  I sharply excised the labrum.  Retractors were placed about the capsule.  Reaming was performed sequentially to 53 mm to accept a 54 component.  I trialed a 52 acetabulum that would completely seat with good rim fit. The 54 would not completely seat.  The final Gription 3 metallic acetabular component 54-mm outer-diameter was then impacted in place, found to have very nice secure fit without any motion.  I did use the external guide for positioning.  The trial polyethylene component was then screwed in place.  We then inserted the 13.5-mm small-stature rasp and then trailed several neck lengths and felt that the +5 with 36-mm outer diameter  hip ball was a perfect fit and very stable.  There was no subluxation.  The leg lengths appeared to be symmetrical.  Trial components were removed.  We copiously irrigated the acetabulum. Of note is that I did deepen the acetabulum as it appeared to be quite shallow on preoperative films.  The apex hole eliminator was then inserted followed by the polyethylene marathon liner +4 with a 10-degree posterior lip.  I did insert some tranexamic acid topically into the acetabulum.  Retractors were then carefully placed about the proximal femur and we then impacted the 13.5-mm small-stature femoral component in about 15 degrees of anteversion.  A perfect fit on the calcar.  We trialed the +5 neck length with a 36-mm outer-diameter hip ball and felt that it was a perfect fit as previously mentioned.  This was removed.  We cleaned the [facility] taper neck and applied the 36-mm outer-diameter, +5 neck length ceramic femoral head.  We inspected the acetabulum and to be sure it was clear of any loose material, then reduced the entire construct.  Full range of motion.  There was no instability or subluxation.  It was not tight in extension and very nice coverage.  Tranexamic acid was then inserted into the capsule around the components and into the deep tissue.  I closed the capsule anatomically with a running #1 Ethibond.  Short external rotators were closed with the similar material.  We did inject the capsule with 0.25% Marcaine with epinephrine.  Again, I checked the sciatic nerve to be sure that it was intact and well out of harm's way.  The IT band and muscle fascia were then closed with running 0 Vicryl, subcu with 2-0 Vicryl and 3-0 Monocryl.  Skin closed with skin clips. Sterile bulky dressing was applied.  The patient was then carefully placed in the supine position, knee immobilizer placed to the right lower extremity.  The patient was then transported to the operating room stretcher  and returned to the postanesthesia recovery room without complications.     Vonna Kotyk. Durward Fortes, M.D.     PWW/MEDQ  D:  09/22/2016  T:  09/23/2016  Job:  [id number]

## 2016-09-23 NOTE — Progress Notes (Signed)
Physical Therapy Treatment Patient Details Name: Charles [name] MRN: [id number] DOB: 11-26-1970 Today's Date: 09/23/2016    History of Present Illness Pt is a 46 y/o male s/p elective R THA, posterior hip precautions. PMH includes psoriatic arthritis, COPD, chronic back pain, chronic neck pain, and s/p L THA.     PT Comments    Pt anxious to get up and get moving.  Pt tolerated tx well will progress to standing exercises and stair training this pm.     Follow Up Recommendations  DC plan and follow up therapy as arranged by surgeon;Supervision for mobility/OOB     Equipment Recommendations  None recommended by PT (has all dme necessary)    Recommendations for Other Services OT consult     Precautions / Restrictions Precautions Precautions: Posterior Hip Precaution Booklet Issued:  (issued by PT ) Precaution Comments: Pt able to verbalize 3/3 hip precautions.  Required Braces or Orthoses: Knee Immobilizer - Right Knee Immobilizer - Right: Other (comment) (until discontinued) Restrictions Weight Bearing Restrictions: Yes RLE Weight Bearing: Weight bearing as tolerated    Mobility  Bed Mobility Overal bed mobility: Needs Assistance Bed Mobility: Supine to Sit     Supine to sit: Min assist     General bed mobility comments: Pt in recliner on arrival.    Transfers Overall transfer level: Needs assistance Equipment used: Rolling walker (2 wheeled) Transfers: Sit to/from Stand Sit to Stand: Min guard         General transfer comment: Cues for hand placement and forward advancement of RLE forward to maintain posterior hip precautions.    Ambulation/Gait Ambulation/Gait assistance: Min guard Ambulation Distance (Feet): 250 Feet Assistive device: Rolling walker (2 wheeled) Gait Pattern/deviations: Step-through pattern;Decreased stride length;Trunk flexed;Antalgic   Gait velocity interpretation: Below normal speed for age/gender General Gait Details: Cues for upper  trunk control, adjusted RW height to improve posture.  Pt required cues when turning right to maintain posterior hip precautions.     Stairs            Wheelchair Mobility    Modified Rankin (Stroke Patients Only)       Balance Overall balance assessment: Needs assistance Sitting-balance support: No upper extremity supported;Feet supported Sitting balance-[name] Sitting balance - Comments: static sitting EOB    Standing balance support: Bilateral upper extremity supported;During functional activity Standing balance-Leahy Scale: Poor Standing balance comment: Reliant on RW for stability.                             Cognition Arousal/Alertness: Awake/alert Behavior During Therapy: WFL for tasks assessed/performed Overall Cognitive Status: Within Functional Limits for tasks assessed                                        Exercises Total Joint Exercises Ankle Circles/Pumps: AROM;Both;10 reps;Supine Quad Sets: AROM;Right;10 reps;Supine Short Arc Quad: AROM;Right;10 reps;Supine Heel Slides: Right;10 reps;Supine;AAROM Hip ABduction/ADduction: Right;10 reps;Supine;AAROM Long Arc Quad: AROM;Right;10 reps;Supine    General Comments        Pertinent Vitals/Pain Pain Assessment: 0-10 Pain Score: 5  Pain Location: R hip  Pain Descriptors / Indicators: Aching;Grimacing;Operative site guarding Pain Intervention(s): Monitored during session;Repositioned;Ice applied    Home Living Family/patient expects to be discharged to:: Private residence Living Arrangements: Spouse/significant other Available Help at Discharge: Family;Available 24 hours/day Type of Home: [place]  Access: Stairs to enter Entrance Stairs-Rails: None Home Layout: One level Home Equipment: Environmental consultant - 2 wheels;Cane - single point;Bedside commode      Prior Function Level of Independence: Independent with assistive device(s)      Comments: Using cane for  ambulation secondary to pain    PT Goals (current goals can now be found in the care plan section) Acute Rehab PT Goals Patient Stated Goal: to decrease pain  Potential to Achieve Goals: Good Progress towards PT goals: Progressing toward goals    Frequency    7X/week      PT Plan Current plan remains appropriate    Co-evaluation              AM-PAC PT "6 Clicks" Daily Activity  Outcome Measure  Difficulty turning over in bed (including adjusting bedclothes, sheets and blankets)?: A Little Difficulty moving from lying on back to sitting on the side of the bed? : Unable Difficulty sitting down on and standing up from a chair with arms (e.g., wheelchair, bedside commode, etc,.)?: Unable Help needed moving to and from a bed to chair (including a wheelchair)?: A Little Help needed walking in hospital room?: A Little Help needed climbing 3-5 steps with a railing? : A Little 6 Click Score: 14    End of Session Equipment Utilized During Treatment: Gait belt Activity Tolerance: Patient limited by pain Patient left: in chair;with call bell/phone within reach Nurse Communication: Mobility status;Patient requests pain meds PT Visit Diagnosis: Other abnormalities of gait and mobility (R26.89);Pain Pain - Right/Left: Right Pain - part of body: Hip     Time: 1100-1125 PT Time Calculation (min) (ACUTE ONLY): 25 min  Charges:  $Gait Training: 8-22 mins $Therapeutic Exercise: 8-22 mins                    G Codes:       Governor Rooks, PTA pager [phone]    Cristela Blue 09/23/2016, 11:35 AM

## 2016-09-23 NOTE — Evaluation (Signed)
Occupational Therapy Evaluation Patient Details Name: Charles [name] MRN: [id number] DOB: July 22, 1970 Today's Date: 09/23/2016    History of Present Illness Pt is a 46 y/o male s/p elective R THA, posterior hip precautions. PMH includes psoriatic arthritis, COPD, chronic back pain, chronic neck pain, and s/p L THA.    Clinical Impression   This 46 y/o M presents with the above. At baseline Pt is mod independent with functional mobility using SPC and independent with ADLs. Pt completed room level functional mobility at RW level with MinA, currently requires MaxA for LB ADLs secondary to adhering to posterior hip precautions. Pt lives with spouse who is able to assist with ADL completion PRN after return home. Will continue to follow acutely to maximize Pt's safety and independence with ADLs and functional mobility.     Follow Up Recommendations  DC plan and follow up therapy as arranged by surgeon;Supervision/Assistance - 24 hour    Equipment Recommendations  None recommended by OT;Other (comment) (Pt DME needs are met )           Precautions / Restrictions Precautions Precautions: Posterior Hip Precaution Booklet Issued:  (issued by PT ) Precaution Comments: Pt able to verbalize 3/3 hip precautions.  Required Braces or Orthoses: Knee Immobilizer - Right Knee Immobilizer - Right: Other (comment) (until discontinued ) Restrictions Weight Bearing Restrictions: No RLE Weight Bearing: Weight bearing as tolerated      Mobility Bed Mobility Overal bed mobility: Needs Assistance Bed Mobility: Supine to Sit     Supine to sit: Min assist     General bed mobility comments: Min A for RLE management.   Transfers Overall transfer level: Needs assistance Equipment used: Rolling walker (2 wheeled) Transfers: Sit to/from Stand Sit to Stand: Min assist         General transfer comment: MinA to rise and steady     Balance Overall balance assessment: Needs  assistance Sitting-balance support: No upper extremity supported;Feet supported Sitting balance-[name] Sitting balance - Comments: static sitting EOB    Standing balance support: Bilateral upper extremity supported;During functional activity Standing balance-Leahy Scale: Poor Standing balance comment: Reliant on RW for stability.                            ADL either performed or assessed with clinical judgement   ADL Overall ADL's : Needs assistance/impaired Eating/Feeding: Set up;Sitting   Grooming: Minimal assistance;Standing   Upper Body Bathing: Min guard;Sitting   Lower Body Bathing: Moderate assistance;Sit to/from stand;Adhering to hip precautions;With caregiver independent assisting   Upper Body Dressing : Minimal assistance;Sitting Upper Body Dressing Details (indicate cue type and reason): donning/doffing new gown  Lower Body Dressing: Maximal assistance;Sit to/from stand;Adhering to hip precautions;With caregiver independent assisting   Toilet Transfer: Minimal assistance;Ambulation;BSC;RW Toilet Transfer Details (indicate cue type and reason): simulated in bed to chair transfer; BSC over toilet  Toileting- Clothing Manipulation and Hygiene: Minimal assistance;Sit to/from stand       Functional mobility during ADLs: Minimal assistance;Rolling walker General ADL Comments: educated on compensatory techniques for completing ADLs while adhering to precautions                          Pertinent Vitals/Pain Pain Score: 8  Pain Location: R hip  Pain Descriptors / Indicators: Aching;Grimacing;Operative site guarding Pain Intervention(s): Limited activity within patient's tolerance;Monitored during session;Repositioned;Ice applied;Patient requesting pain meds-RN notified  Extremity/Trunk Assessment Upper Extremity Assessment Upper Extremity Assessment: Overall WFL for tasks assessed   Lower Extremity Assessment Lower Extremity  Assessment: Defer to PT evaluation   Cervical / Trunk Assessment Cervical / Trunk Assessment: Normal   Communication Communication Communication: No difficulties   Cognition Arousal/Alertness: Awake/alert Behavior During Therapy: WFL for tasks assessed/performed Overall Cognitive Status: Within Functional Limits for tasks assessed                                                      Home Living Family/patient expects to be discharged to:: Private residence Living Arrangements: Spouse/significant other Available Help at Discharge: Family;Available 24 hours/day Type of Home: House Home Access: Stairs to enter [employer] of Steps: 1 (threshold step ) Entrance Stairs-Rails: None Home Layout: One level     Bathroom Shower/Tub: Tub/shower unit;Walk-in shower   Bathroom Toilet: Standard     Home Equipment: Environmental consultant - 2 wheels;Cane - single point;Bedside commode          Prior Functioning/Environment Level of Independence: Independent with assistive device(s)        Comments: Using cane for ambulation secondary to pain         OT Problem List: Decreased range of motion;Decreased activity tolerance;Decreased strength;Impaired balance (sitting and/or standing);Decreased knowledge of use of DME or AE;Pain      OT Treatment/Interventions: Self-care/ADL training;DME and/or AE instruction;Therapeutic activities;Balance training;Therapeutic exercise;Energy conservation;Patient/family education    OT Goals(Current goals can be found in the care plan section) Acute Rehab OT Goals Patient Stated Goal: to decrease pain  OT Goal Formulation: With patient Time For Goal Achievement: 10/07/16 Potential to Achieve Goals: Good  OT Frequency: Min 2X/week                             AM-PAC PT "6 Clicks" Daily Activity     Outcome Measure Help from another person eating meals?: None Help from another person taking care of personal grooming?:  A Little Help from another person toileting, which includes using toliet, bedpan, or urinal?: A Lot Help from another person bathing (including washing, rinsing, drying)?: A Little Help from another person to put on and taking off regular upper body clothing?: None Help from another person to put on and taking off regular lower body clothing?: A Lot 6 Click Score: 18   End of Session Equipment Utilized During Treatment: Gait belt;Rolling walker Nurse Communication: Mobility status;Patient requests pain meds  Activity Tolerance: Patient tolerated treatment well Patient left: in chair;with call bell/phone within reach  OT Visit Diagnosis: Other abnormalities of gait and mobility (R26.89);Pain Pain - Right/Left: Right Pain - part of body: Hip                Time: 0981-1914 OT Time Calculation (min): 27 min Charges:  OT General Charges $OT Visit: 1 Visit OT Evaluation $OT Eval Low Complexity: 1 Low OT Treatments $Self Care/Home Management : 8-22 mins G-Codes:     [name], OT Pager [phone] 09/23/2016   [name] 09/23/2016, 11:01 AM

## 2016-09-23 NOTE — Progress Notes (Signed)
Physical Therapy Treatment Patient Details Name: Charles [name] MRN: [id number] DOB: 06-08-1970 Today's Date: 09/23/2016    History of Present Illness Pt is a 46 y/o male s/p elective R THA, posterior hip precautions. PMH includes psoriatic arthritis, COPD, chronic back pain, chronic neck pain, and s/p L THA.     PT Comments    Pt progressing towards goals. Practiced stair navigation with use of RW and required min guard assist. Gait distance continues to be limited by pain. Current recommendations remain appropriate. Will continue to follow acutely to maximize functional mobility independence and safety.    Follow Up Recommendations  DC plan and follow up therapy as arranged by surgeon;Supervision for mobility/OOB     Equipment Recommendations  None recommended by PT (Has all necessary DME)    Recommendations for Other Services OT consult     Precautions / Restrictions Precautions Precautions: Posterior Hip Precaution Booklet Issued: Yes (comment) Precaution Comments: Pt able to recall 3/3 hip precautions  Required Braces or Orthoses: Knee Immobilizer - Right Knee Immobilizer - Right: Other (comment) (until discontinued ) Restrictions Weight Bearing Restrictions: Yes RLE Weight Bearing: Weight bearing as tolerated    Mobility  Bed Mobility Overal bed mobility: Needs Assistance Bed Mobility: Sit to Supine       Sit to supine: Min assist   General bed mobility comments: Min A for RLE management. Reported L hip was locking up as well. Instructed to discuss with MD.   Transfers Overall transfer level: Needs assistance Equipment used: Rolling walker (2 wheeled) Transfers: Sit to/from Stand Sit to Stand: Min guard         General transfer comment: Verbal cues for hand placement. Demonstrating good technique with placing RLE forward to maintain hip precautions.   Ambulation/Gait Ambulation/Gait assistance: Min guard Ambulation Distance (Feet): 250 Feet Assistive  device: Rolling walker (2 wheeled) Gait Pattern/deviations: Step-through pattern;Decreased stride length;Trunk flexed;Antalgic Gait velocity: Decreased Gait velocity interpretation: Below normal speed for age/gender General Gait Details: Slow, antalgic gait secondary to R hip pain. Pt with improved posture this session. Distance limited secondary to R hip pain.    Stairs Stairs: Yes   Stair Management: No rails;Step to pattern;Forwards;With walker Number of Stairs: 1 (threshold step ) General stair comments: Verbal cues for sequencing with RW and for LE sequencing. Min guard for safety. Demonstrated safe stair navigation technique.   Wheelchair Mobility    Modified Rankin (Stroke Patients Only)       Balance Overall balance assessment: Needs assistance Sitting-balance support: No upper extremity supported;Feet supported Sitting balance-[name]     Standing balance support: Bilateral upper extremity supported;During functional activity Standing balance-Leahy Scale: Poor Standing balance comment: Reliant on RW for stability.                             Cognition Arousal/Alertness: Awake/alert Behavior During Therapy: WFL for tasks assessed/performed Overall Cognitive Status: Within Functional Limits for tasks assessed                                        Exercises Total Joint Exercises Knee Flexion: AROM;Right;10 reps;Standing (using RW ) Marching in Standing: AROM;Right;10 reps;Standing (using RW )    General Comments        Pertinent Vitals/Pain Pain Assessment: 0-10 Pain Score: 6  Pain Location: R hip  Pain Descriptors / Indicators:  Aching;Grimacing;Operative site guarding Pain Intervention(s): Limited activity within patient's tolerance;Monitored during session;Repositioned;Patient requesting pain meds-RN notified    Home Living                      Prior Function            PT Goals (current goals can now  be found in the care plan section) Acute Rehab PT Goals Patient Stated Goal: to decrease pain  PT Goal Formulation: With patient Time For Goal Achievement: 09/29/16 Potential to Achieve Goals: Good Progress towards PT goals: Progressing toward goals    Frequency    7X/week      PT Plan Current plan remains appropriate    Co-evaluation              AM-PAC PT "6 Clicks" Daily Activity  Outcome Measure  Difficulty turning over in bed (including adjusting bedclothes, sheets and blankets)?: A Little Difficulty moving from lying on back to sitting on the side of the bed? : A Little Difficulty sitting down on and standing up from a chair with arms (e.g., wheelchair, bedside commode, etc,.)?: Unable Help needed moving to and from a bed to chair (including a wheelchair)?: A Little Help needed walking in hospital room?: A Little Help needed climbing 3-5 steps with a railing? : A Little 6 Click Score: 16    End of Session Equipment Utilized During Treatment: Gait belt Activity Tolerance: Patient limited by pain Patient left: in bed;with call bell/phone within reach Nurse Communication: Mobility status;Patient requests pain meds PT Visit Diagnosis: Other abnormalities of gait and mobility (R26.89);Pain Pain - Right/Left: Right Pain - part of body: Hip     Time: 9381-8299 PT Time Calculation (min) (ACUTE ONLY): 23 min  Charges:  $Gait Training: 23-37 mins                    G Codes:       Leighton Ruff, PT, DPT  Acute Rehabilitation Services  Pager: [phone]    Rudean Hitt 09/23/2016, 3:37 PM

## 2016-09-23 NOTE — Care Management Note (Signed)
Case Management Note  Patient Details  Name: OTHO MICHALIK MRN: [id number] Date of Birth: 06-Oct-1970  Subjective/Objective:   46 yr old gentleman s/p right total hip arthroplasty.                 Action/Plan: Case manager spoke with patient and wife concerning discharge plan and DME. Patient was preoperatively setup with [facility], no changes. Has RW and 3in1 at home. Patient will have family support at discharge.    Expected Discharge Date:    09/24/16              Expected Discharge Plan:  [place]  In-House Referral:  NA  Discharge planning Services  CM Consult  Post Acute Care Choice:  Home Health, Durable Medical Equipment Choice offered to:  Patient  DME Arranged:   (Has RW and 3in1) DME Agency:  NA  HH Arranged:  PT [place] Agency:  [facility] (formerly [employer])  Status of Service:  Completed, signed off  If discussed at [employer], dates discussed:    Additional Comments:  Ninfa Meeker, RN 09/23/2016, 11:58 AM

## 2016-09-23 NOTE — Progress Notes (Signed)
PATIENT ID: TAMARI BUSIC        MRN:  [id number]          DOB/AGE: Aug 04, 1970 / 46 y.o.    Joni Fears, MD   Biagio Borg, PA-C [address] [place],   [address]                             [phone]   PROGRESS NOTE  Subjective:  negative for Chest Pain  negative for Shortness of Breath  negative for Nausea/Vomiting   negative for Calf Pain    Tolerating Diet: yes         Patient reports pain as mild.     Comfortable this am in bed  Objective: Vital signs in last 24 hours:   Patient Vitals for the past 24 hrs:  BP Temp Temp src Pulse Resp SpO2 Height Weight  09/23/16 0338 117/70 98.4 F (36.9 C) Oral 76 18 97 % - -  09/23/16 0114 (!) 119/56 99.1 F (37.3 C) Oral 79 17 97 % - -  09/22/16 2226 122/61 99.1 F (37.3 C) Oral 70 16 98 % - -  09/22/16 1645 (!) 121/53 (!) 97 F (36.1 C) Oral (!) 50 18 98 % - -  09/22/16 1500 125/69 - - (!) 45 20 97 % - -  09/22/16 1445 120/69 97.7 F (36.5 C) - (!) 46 15 100 % - -  09/22/16 1430 116/72 - - (!) 50 12 100 % - -  09/22/16 1415 117/70 - - (!) 51 12 100 % - -  09/22/16 1400 113/70 - - (!) 56 17 99 % - -  09/22/16 1345 119/72 - - (!) 55 16 99 % - -  09/22/16 1330 115/85 97.7 F (36.5 C) - 68 13 99 % - -  09/22/16 0822 (!) 143/79 98.2 F (36.8 C) Oral 74 18 98 % - -  09/22/16 0817 - - - - - - 5\' 11"  (1.803 m) 194 lb (88 kg)      Intake/Output from previous day:   09/18 0701 - 09/19 0700 In: 2266.3 [P.O.:70; I.V.:1796.3] Out: 1575 [Urine:1275]   Intake/Output this shift:   No intake/output data recorded.   Intake/Output      09/18 0701 - 09/19 0700 09/19 0701 - 09/20 0700   P.O. 70    I.V. (mL/kg) 1796.3 (20.4)    IV Piggyback 400    Total Intake(mL/kg) 2266.3 (25.8)    Urine (mL/kg/hr) 1275    Blood 300    Total Output 1575     Net +691.3             LABORATORY DATA:  Recent Labs  09/23/16 0554  WBC 8.1  HGB 12.5*  HCT 37.5*  PLT 131*    Recent Labs  09/23/16 0554  NA 136  K 4.0    CL 106  CO2 25  BUN 15  CREATININE 1.05  GLUCOSE 100*  CALCIUM 8.4*   Lab Results  Component Value Date   INR 0.91 09/10/2016    Recent Radiographic Studies :  Dg Chest 2 View  Result Date: 09/10/2016 CLINICAL DATA:  Preop hip replacement EXAM: CHEST  2 VIEW COMPARISON:  02/18/2015 FINDINGS: The heart size and mediastinal contours are within normal limits. Both lungs are clear. The visualized skeletal structures are unremarkable. IMPRESSION: No active cardiopulmonary disease. Electronically Signed   By: Franchot Gallo M.D.   On: 09/10/2016 09:03  Dg Hip Port Unilat With Pelvis 1v Right  Result Date: 09/22/2016 CLINICAL DATA:  Postop EXAM: DG HIP (WITH OR WITHOUT PELVIS) 1V PORT RIGHT COMPARISON:  07/30/2015 FINDINGS: Previous left hip replacement. Pubic symphysis and rami appear intact. Status post right hip replacement with normal alignment. Cutaneous staples over the right hip with small amount of soft tissue emphysema. IMPRESSION: Status post right hip replacement with expected postsurgical change. Prior left hip replacement with normal alignment Electronically Signed   By: Donavan Foil M.D.   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   MRN: [id number]

## 2016-09-24 LAB — CBC
HCT: 35 % — ABNORMAL LOW (ref 39.0–52.0)
Hemoglobin: 11.7 g/dL — ABNORMAL LOW (ref 13.0–17.0)
MCH: 31.8 pg (ref 26.0–34.0)
MCHC: 33.4 g/dL (ref 30.0–36.0)
MCV: 95.1 fL (ref 78.0–100.0)
PLATELETS: 128 10*3/uL — AB (ref 150–400)
RBC: 3.68 MIL/uL — ABNORMAL LOW (ref 4.22–5.81)
RDW: 12.7 % (ref 11.5–15.5)
WBC: 10 10*3/uL (ref 4.0–10.5)

## 2016-09-24 LAB — BASIC METABOLIC PANEL
Anion gap: 6 (ref 5–15)
BUN: 9 mg/dL (ref 6–20)
CO2: 24 mmol/L (ref 22–32)
CREATININE: 0.86 mg/dL (ref 0.61–1.24)
Calcium: 8.5 mg/dL — ABNORMAL LOW (ref 8.9–10.3)
Chloride: 105 mmol/L (ref 101–111)
GFR calc Af Amer: >60 mL/min (ref >60)
Glucose, Bld: 128 mg/dL — ABNORMAL HIGH (ref 65–99)
Potassium: 3.7 mmol/L (ref 3.5–5.1)
SODIUM: 135 mmol/L (ref 135–145)

## 2016-09-24 MED ORDER — OXYCODONE-ACETAMINOPHEN 5-325 MG PO TABS: 1.0000 | ORAL_TABLET | ORAL | 0 refills | Status: DC | PRN

## 2016-09-24 MED ORDER — RIVAROXABAN 10 MG PO TABS: 10.0000 mg | ORAL_TABLET | Freq: Every day | ORAL | 0 refills | Status: DC

## 2016-09-24 NOTE — Progress Notes (Signed)
Occupational Therapy Treatment Patient Details Name: Charles [name] MRN: [id number] DOB: May 17, 1970 Today's Date: 09/24/2016    History of present illness Pt is a 46 y/o male s/p elective R THA, posterior hip precautions. PMH includes psoriatic arthritis, COPD, chronic back pain, chronic neck pain, and s/p L THA.    OT comments  Pt progressing well towards goals, completing room level functional mobility and functional mobility transfers at RW level with MinGuard assist throughout. Educated on compensatory techniques for completing ADLs while adhering to precautions. Questions answered throughout. Feel Pt will progress well after discharge with family assist PRN. Will continue to follow while in acute setting.    Follow Up Recommendations  DC plan and follow up therapy as arranged by surgeon;Supervision/Assistance - 24 hour    Equipment Recommendations  None recommended by OT          Precautions / Restrictions Precautions Precautions: Posterior Hip Precaution Booklet Issued: Yes (comment) Precaution Comments: Pt able to recall 3/3 hip precautions  Required Braces or Orthoses: Knee Immobilizer - Right Knee Immobilizer - Right: Other (comment) (until discontinued ) Restrictions Weight Bearing Restrictions: Yes RLE Weight Bearing: Weight bearing as tolerated       Mobility Bed Mobility               General bed mobility comments: Pt received in recliner chair on arrival.    Transfers Overall transfer level: Needs assistance Equipment used: Rolling walker (2 wheeled) Transfers: Sit to/from Stand Sit to Stand: Supervision Stand pivot transfers: Supervision       General transfer comment: Good technique.  Pt remains guarded due to pain supervision for safety.      Balance Overall balance assessment: Needs assistance Sitting-balance support: No upper extremity supported;Feet supported Sitting balance-[name] Sitting balance - Comments: static sitting EOB     Standing balance support: Bilateral upper extremity supported;During functional activity Standing balance-Leahy Scale: Fair Standing balance comment: static standing at sink                            ADL either performed or assessed with clinical judgement   ADL Overall ADL's : Needs assistance/impaired     Grooming: Oral care;Wash/dry hands;Min guard;Standing                 Lower Body Dressing Details (indicate cue type and reason): educated on use of reacher and compensatory techniques for completing task while adhering to posterior hip precautions  Toilet Transfer: Min guard;Ambulation;BSC;RW Toilet Transfer Details (indicate cue type and reason): BSC over toilet  Toileting- Clothing Manipulation and Hygiene: Min guard;Sit to/from stand Toileting - Clothing Manipulation Details (indicate cue type and reason): Pt voiding bladder in standing  Tub/ Shower Transfer: Tub transfer;3 in 1;Ambulation;Rolling walker;Min Chief Executive Officer Details (indicate cue type and reason): educated Pt on modified tub transfer to 3:1 with Pt return demonstrating with MinGuard throughout  Functional mobility during ADLs: Min guard;Rolling walker                         Cognition Arousal/Alertness: Awake/alert Behavior During Therapy: WFL for tasks assessed/performed Overall Cognitive Status: Within Functional Limits for tasks assessed  Pertinent Vitals/ Pain       Pain Assessment: Faces Pain Score: 6  Faces Pain Scale: Hurts little more Pain Location: R hip  Pain Descriptors / Indicators: Aching;Grimacing;Operative site guarding Pain Intervention(s): Limited activity within patient's tolerance;Monitored during session;Ice applied                                                          Frequency  Min 2X/week        Progress Toward Goals  OT  Goals(current goals can now be found in the care plan section)  Progress towards OT goals: Progressing toward goals  Acute Rehab OT Goals Patient Stated Goal: to decrease pain  OT Goal Formulation: With patient Time For Goal Achievement: 10/07/16 Potential to Achieve Goals: Good  Plan Discharge plan remains appropriate                     AM-PAC PT "6 Clicks" Daily Activity     Outcome Measure   Help from another person eating meals?: None Help from another person taking care of personal grooming?: A Little Help from another person toileting, which includes using toliet, bedpan, or urinal?: A Lot Help from another person bathing (including washing, rinsing, drying)?: A Little Help from another person to put on and taking off regular upper body clothing?: None Help from another person to put on and taking off regular lower body clothing?: A Lot 6 Click Score: 18    End of Session Equipment Utilized During Treatment: Gait belt;Rolling walker  OT Visit Diagnosis: Other abnormalities of gait and mobility (R26.89);Pain Pain - Right/Left: Right Pain - part of body: Hip   Activity Tolerance Patient tolerated treatment well   Patient Left in chair;with call bell/phone within reach   Nurse Communication Mobility status;Patient requests pain meds        Time: 9371-6967 OT Time Calculation (min): 42 min  Charges: OT General Charges $OT Visit: 1 Visit OT Treatments $Self Care/Home Management : 23-37 mins  [name], [place] Pager [phone] 09/24/2016    [name] 09/24/2016, 4:26 PM

## 2016-09-24 NOTE — Progress Notes (Signed)
Subjective: 2 Days Post-Op Procedure(s) (LRB): RIGHT TOTAL HIP ARTHROPLASTY (Right) Patient reports pain as mild and moderate.    Objective: Vital signs in last 24 hours: Temp:  [98.8 F (37.1 C)-100.7 F (38.2 C)] 98.8 F (37.1 C) (09/20 0542) Pulse Rate:  [84-96] 84 (09/20 0542) Resp:  [16] 16 (09/20 0542) BP: (128-135)/(67-68) 135/67 (09/20 0542) SpO2:  [95 %-97 %] 95 % (09/20 0542)  Intake/Output from previous day: 09/19 0701 - 09/20 0700 In: 840 [P.O.:840] Out: 2500 [Urine:2500] Intake/Output this shift: Total I/O In: 480 [P.O.:480] Out: 400 [Urine:400]   Recent Labs  09/23/16 0554 09/24/16 0400  HGB 12.5* 11.7*    Recent Labs  09/23/16 0554 09/24/16 0400  WBC 8.1 10.0  RBC 3.93* 3.68*  HCT 37.5* 35.0*  PLT 131* 128*    Recent Labs  09/23/16 0554 09/24/16 0400  NA 136 135  K 4.0 3.7  CL 106 105  CO2 25 24  BUN 15 9  CREATININE 1.05 0.86  GLUCOSE 100* 128*  CALCIUM 8.4* 8.5*   No results for input(s): LABPT, INR in the last 72 hours.  Neurologically intact Neurovascular intact Sensation intact distally Intact pulses distally Dorsiflexion/Plantar flexion intact Incision: no drainage  Assessment/Plan: 2 Days Post-Op Procedure(s) (LRB): RIGHT TOTAL HIP ARTHROPLASTY (Right) Discharge home with home health  Biagio Borg 09/24/2016, 3:54 PM

## 2016-09-24 NOTE — Discharge Summary (Signed)
Joni Fears, MD   Biagio Borg, PA-C [address] [place], [place]  [address]                             [phone]  PATIENT ID: Charles [name]        MRN:  [id number]          DOB/AGE: 44-20-1972 / 44 y.o.    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 Arthritis    also has avascular necrosis   . Chronic back pain   . Chronic neck pain   . Chronic shoulder pain   . COPD (chronic obstructive pulmonary disease) ([place])   . GERD (gastroesophageal reflux disease)    tums  OTC  medication  . Headache   . Hypothyroidism   . Sleep apnea    tested more than 5 yrs ago...negative results    PROCEDURE: Procedure(s): RIGHT TOTAL HIP ARTHROPLASTY Right on 09/22/2016  CONSULTS: none    HISTORY: Charles [name] a 46 y.o. male  Who has a history of pain and functional disability in the right hip(s) due to arthritis and avascular necrosis and patient has failed non-surgical conservative treatments for greater than 12 weeks to include NSAID's and/or analgesics, use of assistive devices and activity modification.  Onset of symptoms was gradual starting 4 years ago with gradually worsening course since that time.The patient noted no past surgery on the right hip(s).  Patient currently rates pain in the right hip at 8 out of 10 with activity. Patient has night pain, worsening of pain with activity and weight bearing, trendelenberg gait, pain that interfers with activities of daily living and pain with passive range of motion. Patient has evidence of subchondral cysts and subchondral sclerosis by imaging studies. This condition presents safety issues increasing the risk  of falls.This patient has had avascular necrosis of the hip, acetabular fracture, hip dysplasia.  There is no current active infection.  HOSPITAL COURSE:  Charles [name] is a 46 y.o. admitted on 09/22/2016 and found to have a diagnosis of RIGHT HIP AVASCULAR NECROSIS.  After appropriate laboratory studies were obtained  they were taken to the operating room on 09/22/2016 and underwent  Procedure(s): RIGHT TOTAL HIP ARTHROPLASTY  .   They were given perioperative antibiotics:  Anti-infectives    Start     Dose/Rate Route Frequency Ordered Stop   09/22/16 1715  ceFAZolin (ANCEF) IVPB 2g/100 mL premix     2 g 200 mL/hr over 30 Minutes Intravenous Every 6 hours 09/22/16 1522 09/23/16 0057   09/22/16 0805  ceFAZolin (ANCEF) IVPB 2g/100 mL premix     2 g 200 mL/hr over 30 Minutes Intravenous On call to O.R. 09/22/16 0805 09/22/16 1125    .  Tolerated the procedure well.  Placed with a foley intraoperatively.    Toradol was given post op.  POD #1, allowed out of bed to a chair.  PT for ambulation and exercise program.  Foley D/C'd in morning.  IV saline locked.  O2 discontionued.  POD #2, continued PT and ambulation.   .  The remainder of  the hospital course was dedicated to ambulation and strengthening.   The patient was discharged on 2 Days Post-Op in  Stable condition.  Blood products given:none  DIAGNOSTIC STUDIES: Recent vital signs: Patient Vitals for the past 24 hrs:  BP Temp Temp src Pulse Resp SpO2  09/24/16 0542 135/67 98.8 F (37.1 C) Oral 84 16 95 %  09/24/16 0022 - 99 F (37.2 C) Oral - - -  09/23/16 2037 128/68 (!) 100.7 F (38.2 C) Oral 96 16 97 %       Recent laboratory studies:  Recent Labs  09/23/16 0554 09/24/16 0400  WBC 8.1 10.0  HGB 12.5* 11.7*  HCT 37.5* 35.0*  PLT 131* 128*    Recent Labs  09/23/16 0554 09/24/16 0400  NA 136 135  K 4.0 3.7  CL 106 105  CO2 25 24  BUN 15 9  CREATININE 1.05 0.86  GLUCOSE 100* 128*  CALCIUM 8.4* 8.5*   Lab  Results  Component Value Date   INR 0.91 09/10/2016     Recent Radiographic Studies :  Dg Chest 2 View  Result Date: 09/10/2016 CLINICAL DATA:  Preop hip replacement EXAM: CHEST  2 VIEW COMPARISON:  02/18/2015 FINDINGS: The heart size and mediastinal contours are within normal limits. Both lungs are clear. The visualized skeletal structures are unremarkable. IMPRESSION: No active cardiopulmonary disease. Electronically Signed   By: Franchot Gallo M.D.   On: 09/10/2016 09:03   Dg Hip Port Unilat With Pelvis 1v Right  Result Date: 09/22/2016 CLINICAL DATA:  Postop EXAM: DG HIP (WITH OR WITHOUT PELVIS) 1V PORT RIGHT COMPARISON:  07/30/2015 FINDINGS: Previous left hip replacement. Pubic symphysis and rami appear intact. Status post right hip replacement with normal alignment. Cutaneous staples over the right hip with small amount of soft tissue emphysema. IMPRESSION: Status post right hip replacement with expected postsurgical change. Prior left hip replacement with normal alignment Electronically Signed   By: Donavan Foil M.D.   On: 09/22/2016 20:49    DISCHARGE INSTRUCTIONS: Discharge Instructions    Call MD / Call 911    Complete by:  As directed    If you experience chest pain or shortness of breath, CALL 911 and be transported to the hospital emergency room.  If you develope a fever above 101 F, pus (white drainage) or increased drainage or redness at the wound, or calf pain, call your surgeon's office.   Change dressing    Complete by:  As directed    DO NOT REMOVE OR CHANGE DRESSING   Constipation Prevention    Complete by:  As directed    Drink plenty of fluids.  Prune juice may be helpful.  You may use a stool softener, such as Colace (over the counter) 100 mg twice a day.  Use MiraLax (over the counter) for constipation as needed.   Diet general    Complete by:  As directed    Discharge instructions    Complete by:  As directed    [place]  items at home which could result in a fall. This includes throw rugs or furniture in walking pathways ICE to the affected joint every three hours while awake for 30 minutes at a time, for at least the first 3-5 days, and then as needed for pain and swelling.  Continue to use ice for pain and swelling. You may notice swelling that will progress down to the foot and ankle.  This is normal after surgery.  Elevate your leg when you are not up walking on it.   Continue to use the breathing machine you got in the hospital (incentive spirometer) which will help keep your temperature down.  It is common for your temperature to cycle up and down following surgery, especially at night when you are not up moving around and exerting yourself.  The breathing machine keeps your lungs expanded and your temperature down.   DIET:  As you were doing prior to hospitalization, we recommend a well-balanced diet.  DRESSING / WOUND CARE / SHOWERING  Keep the surgical dressing until follow up.  The dressing is water proof, so you can shower without any extra covering.  IF THE DRESSING FALLS OFF or the wound gets wet inside, change the dressing with sterile gauze.  Please use good hand washing techniques before changing the dressing.  Do not use any lotions or creams on the incision until instructed by your surgeon.    ACTIVITY  Increase activity slowly as tolerated, but follow the weight bearing instructions below.   No driving for 6 weeks or until further direction given by your physician.  You cannot drive while taking narcotics.  No lifting or carrying greater than 10 lbs. until further directed by your surgeon. Avoid periods of inactivity such as sitting longer than an hour when not asleep. This helps prevent blood clots.  You may return to work once you are authorized by your doctor.     WEIGHT BEARING   Weight bearing as tolerated with assist device (walker, cane, etc) as directed, use it as long as suggested  by your surgeon or therapist, typically at least 4-6 weeks.   EXERCISES  Results after joint replacement surgery are often greatly improved when you follow the exercise, range of motion and muscle strengthening exercises prescribed by your doctor. Safety measures are also important to protect the joint from further injury. Any time any of these exercises cause you to have increased pain or swelling, decrease what you are doing until you are comfortable again and then slowly increase them. If you have problems or questions, call your caregiver or physical therapist for advice.   Rehabilitation is important following a joint replacement. After just a few days of immobilization, the muscles of the leg can become weakened and shrink (atrophy).  These exercises are designed to build up the tone and strength of the thigh and leg muscles and to improve motion. Often times heat used for twenty to thirty minutes before working out will loosen up your tissues and help with improving the range of motion but do not use heat for the first two weeks following surgery (sometimes heat can increase post-operative swelling).   These exercises can be done on a training (exercise) mat, on the floor, on a table or on a bed. Use whatever works the best and is most comfortable for you.    Use music or television while you are exercising so that the exercises are a pleasant break in your day. This will make your life better with the exercises acting as a break in your routine that you can look forward to.   Perform all exercises about fifteen times, three times per day or as directed.  You should exercise both the operative leg and the other leg as well.   Exercises include:  Quad Sets - Tighten up the muscle on the front of the thigh (Quad) and hold for 5-10 seconds.   Straight Leg Raises - With your knee  straight (if you were given a brace, keep it on), lift the leg to 60 degrees, hold for 3 seconds, and slowly lower the  leg.  Perform this exercise against resistance later as your leg gets stronger.  Leg Slides: Lying on your back, slowly slide your foot toward your buttocks, bending your knee up off the floor (only go as far as is comfortable). Then slowly slide your foot back down until your leg is flat on the floor again.  Angel Wings: Lying on your back spread your legs to the side as far apart as you can without causing discomfort.  Hamstring Strength:  Lying on your back, push your heel against the floor with your leg straight by tightening up the muscles of your buttocks.  Repeat, but this time bend your knee to a comfortable angle, and push your heel against the floor.  You may put a pillow under the heel to make it more comfortable if necessary.   A rehabilitation program following joint replacement surgery can speed recovery and prevent re-injury in the future due to weakened muscles. Contact your doctor or a physical therapist for more information on knee rehabilitation.    CONSTIPATION  Constipation is defined medically as fewer than three stools per week and severe constipation as less than one stool per week.  Even if you have a regular bowel pattern at home, your normal regimen is likely to be disrupted due to multiple reasons following surgery.  Combination of anesthesia, postoperative narcotics, change in appetite and fluid intake all can affect your bowels.   YOU MUST use at least one of the following options; they are listed in order of increasing strength to get the job done.  They are all available over the counter, and you may need to use some, POSSIBLY even all of these options:    Drink plenty of fluids (prune juice may be helpful) and high fiber foods Colace 100 mg by mouth twice a day  Senokot for constipation as directed and as needed Dulcolax (bisacodyl), take with full glass of water  Miralax (polyethylene glycol) once or twice a day as needed.  If you have tried all these things and  are unable to have a bowel movement in the first 3-4 days after surgery call either your surgeon or your primary doctor.    If you experience loose stools or diarrhea, hold the medications until you stool forms back up.  If your symptoms do not get better within 1 week or if they get worse, check with your doctor.  If you experience "the worst abdominal pain ever" or develop nausea or vomiting, please contact the office immediately for further recommendations for treatment.   ITCHING:  If you experience itching with your medications, try taking only a single pain pill, or even half a pain pill at a time.  You can also use Benadryl over the counter for itching or also to help with sleep.   TED HOSE STOCKINGS:  Use stockings on both legs until for at least 2 weeks or as directed by physician office. They may be removed at night for sleeping.  MEDICATIONS:  See your medication summary on the "After Visit Summary" that nursing will review with you.  You may have some home medications which will be placed on hold until you complete the course of blood thinner medication.  It is important for you to complete the blood thinner medication as prescribed.  PRECAUTIONS:  If you experience chest pain or shortness  of breath - call 911 immediately for transfer to the hospital emergency department.   If you develop a fever greater that 101 F, purulent drainage from wound, increased redness or drainage from wound, foul odor from the wound/dressing, or calf pain - CONTACT YOUR SURGEON.                                                   FOLLOW-UP APPOINTMENTS:  If you do not already have a post-op appointment, please call the office for an appointment to be seen by your surgeon.  Guidelines for how soon to be seen are listed in your "After Visit Summary", but are typically between 1-4 weeks after surgery.  OTHER INSTRUCTIONS:   Knee Replacement:  Do not place pillow under knee, focus on keeping the knee straight  while resting. CPM instructions: 0-90 degrees, 2 hours in the morning, 2 hours in the afternoon, and 2 hours in the evening. Place foam block, curve side up under heel at all times except when in CPM or when walking.  DO NOT modify, tear, cut, or change the foam block in any way.  MAKE SURE YOU:  Understand these instructions.  Get help right away if you are not doing well or get worse.    Thank you for letting us be a part of your medical care team.  It is a privilege we respect greatly.  We hope these instructions will help you stay on track for a fast and full recovery!   Driving restrictions    Complete by:  As directed    No driving for 6 weeks   Follow the hip precautions as taught in Physical Therapy    Complete by:  As directed    Increase activity slowly as tolerated    Complete by:  As directed    Lifting restrictions    Complete by:  As directed    No lifting for 6 weeks   Patient may shower    Complete by:  As directed    You may shower over the brown dressing.  DO NOT REMOVE the brown dressing   TED hose    Complete by:  As directed    Use stockings (TED hose) for 4 weeks on RIGHT leg(s).  You may remove them at night for sleeping.   Weight bearing as tolerated    Complete by:  As directed    Laterality:  right   Extremity:  Lower      DISCHARGE MEDICATIONS:   Allergies as of 09/24/2016   No Known Allergies     Medication List    STOP taking these medications   oxyCODONE-acetaminophen 10-325 MG tablet Commonly known as:  PERCOCET Replaced by:  oxyCODONE-acetaminophen 5-325 MG tablet   Secukinumab 150 MG/ML So[place] Commonly known as:  COSENTYX SENSOREADY 300 DOSE     TAKE these medications   cyclobenzaprine 5 MG tablet Commonly known as:  FLEXERIL Take 5 mg by mouth 3 (three) times daily as needed for muscle spasms.   oxyCODONE-acetaminophen 5-325 MG tablet Commonly known as:  PERCOCET/ROXICET Take 1-2 tablets by mouth every 4 (four) hours as needed for  moderate pain or severe pain. Replaces:  oxyCODONE-acetaminophen 10-325 MG tablet   pantoprazole 40 MG tablet Commonly known as:  PROTONIX Take 40 mg by mouth daily.   rivaroxaban 10 MG Tabs  tablet Commonly known as:  XARELTO Take 1 tablet (10 mg total) by mouth daily with breakfast.            Durable Medical Equipment        Start     Ordered   09/22/16 1523  DME Walker rolling  Once    Question:  Patient needs a walker to treat with the following condition  Answer:  S/P total hip arthroplasty   09/22/16 1522   09/22/16 1523  DME 3 n 1  Once     09/22/16 1522   09/22/16 1523  DME Bedside commode  Once    Question:  Patient needs a bedside commode to treat with the following condition  Answer:  S/P total hip arthroplasty   09/22/16 1522       Discharge Care Instructions        Start     Ordered   09/25/16 0000  rivaroxaban (XARELTO) 10 MG TABS tablet  Daily with breakfast    Question:  Supervising Provider  Answer:  Garald Balding   09/24/16 1601   09/24/16 0000  oxyCODONE-acetaminophen (PERCOCET/ROXICET) 5-325 MG tablet  Every 4 hours PRN    Question:  Supervising Provider  Answer:  Garald Balding   09/24/16 1601   09/24/16 0000  Call MD / Call 911    Comments:  If you experience chest pain or shortness of breath, CALL 911 and be transported to the hospital emergency room.  If you develope a fever above 101 F, pus (white drainage) or increased drainage or redness at the wound, or calf pain, call your surgeon's office.   09/24/16 1601   09/24/16 0000  Discharge instructions    Comments:  INSTRUCTIONS AFTER JOINT REPLACEMENT   Remove items at home which could result in a fall. This includes throw rugs or furniture in walking pathways ICE to the affected joint every three hours while awake for 30 minutes at a time, for at least the first 3-5 days, and then as needed for pain and swelling.  Continue to use ice for pain and swelling. You may notice swelling that  will progress down to the foot and ankle.  This is normal after surgery.  Elevate your leg when you are not up walking on it.   Continue to use the breathing machine you got in the hospital (incentive spirometer) which will help keep your temperature down.  It is common for your temperature to cycle up and down following surgery, especially at night when you are not up moving around and exerting yourself.  The breathing machine keeps your lungs expanded and your temperature down.   DIET:  As you were doing prior to hospitalization, we recommend a well-balanced diet.  DRESSING / WOUND CARE / SHOWERING  Keep the surgical dressing until follow up.  The dressing is water proof, so you can shower without any extra covering.  IF THE DRESSING FALLS OFF or the wound gets wet inside, change the dressing with sterile gauze.  Please use good hand washing techniques before changing the dressing.  Do not use any lotions or creams on the incision until instructed by your surgeon.    ACTIVITY  Increase activity slowly as tolerated, but follow the weight bearing instructions below.   No driving for 6 weeks or until further direction given by your physician.  You cannot drive while taking narcotics.  No lifting or carrying greater than 10 lbs. until further directed by your surgeon. Avoid  periods of inactivity such as sitting longer than an hour when not asleep. This helps prevent blood clots.  You may return to work once you are authorized by your doctor.     WEIGHT BEARING   Weight bearing as tolerated with assist device (walker, cane, etc) as directed, use it as long as suggested by your surgeon or therapist, typically at least 4-6 weeks.   EXERCISES  Results after joint replacement surgery are often greatly improved when you follow the exercise, range of motion and muscle strengthening exercises prescribed by your doctor. Safety measures are also important to protect the joint from further injury. Any  time any of these exercises cause you to have increased pain or swelling, decrease what you are doing until you are comfortable again and then slowly increase them. If you have problems or questions, call your caregiver or physical therapist for advice.   Rehabilitation is important following a joint replacement. After just a few days of immobilization, the muscles of the leg can become weakened and shrink (atrophy).  These exercises are designed to build up the tone and strength of the thigh and leg muscles and to improve motion. Often times heat used for twenty to thirty minutes before working out will loosen up your tissues and help with improving the range of motion but do not use heat for the first two weeks following surgery (sometimes heat can increase post-operative swelling).   These exercises can be done on a training (exercise) mat, on the floor, on a table or on a bed. Use whatever works the best and is most comfortable for you.    Use music or television while you are exercising so that the exercises are a pleasant break in your day. This will make your life better with the exercises acting as a break in your routine that you can look forward to.   Perform all exercises about fifteen times, three times per day or as directed.  You should exercise both the operative leg and the other leg as well.   Exercises include:  Quad Sets - Tighten up the muscle on the front of the thigh (Quad) and hold for 5-10 seconds.   Straight Leg Raises - With your knee straight (if you were given a brace, keep it on), lift the leg to 60 degrees, hold for 3 seconds, and slowly lower the leg.  Perform this exercise against resistance later as your leg gets stronger.  Leg Slides: Lying on your back, slowly slide your foot toward your buttocks, bending your knee up off the floor (only go as far as is comfortable). Then slowly slide your foot back down until your leg is flat on the floor again.  Angel Wings: Lying on  your back spread your legs to the side as far apart as you can without causing discomfort.  Hamstring Strength:  Lying on your back, push your heel against the floor with your leg straight by tightening up the muscles of your buttocks.  Repeat, but this time bend your knee to a comfortable angle, and push your heel against the floor.  You may put a pillow under the heel to make it more comfortable if necessary.   A rehabilitation program following joint replacement surgery can speed recovery and prevent re-injury in the future due to weakened muscles. Contact your doctor or a physical therapist for more information on knee rehabilitation.    CONSTIPATION  Constipation is defined medically as fewer than three stools per week and  severe constipation as less than one stool per week.  Even if you have a regular bowel pattern at home, your normal regimen is likely to be disrupted due to multiple reasons following surgery.  Combination of anesthesia, postoperative narcotics, change in appetite and fluid intake all can affect your bowels.   YOU MUST use at least one of the following options; they are listed in order of increasing strength to get the job done.  They are all available over the counter, and you may need to use some, POSSIBLY even all of these options:    Drink plenty of fluids (prune juice may be helpful) and high fiber foods Colace 100 mg by mouth twice a day  Senokot for constipation as directed and as needed Dulcolax (bisacodyl), take with full glass of water  Miralax (polyethylene glycol) once or twice a day as needed.  If you have tried all these things and are unable to have a bowel movement in the first 3-4 days after surgery call either your surgeon or your primary doctor.    If you experience loose stools or diarrhea, hold the medications until you stool forms back up.  If your symptoms do not get better within 1 week or if they get worse, check with your doctor.  If you experience  "the worst abdominal pain ever" or develop nausea or vomiting, please contact the office immediately for further recommendations for treatment.   ITCHING:  If you experience itching with your medications, try taking only a single pain pill, or even half a pain pill at a time.  You can also use Benadryl over the counter for itching or also to help with sleep.   TED HOSE STOCKINGS:  Use stockings on both legs until for at least 2 weeks or as directed by physician office. They may be removed at night for sleeping.  MEDICATIONS:  See your medication summary on the "After Visit Summary" that nursing will review with you.  You may have some home medications which will be placed on hold until you complete the course of blood thinner medication.  It is important for you to complete the blood thinner medication as prescribed.  PRECAUTIONS:  If you experience chest pain or shortness of breath - call 911 immediately for transfer to the hospital emergency department.   If you develop a fever greater that 101 F, purulent drainage from wound, increased redness or drainage from wound, foul odor from the wound/dressing, or calf pain - CONTACT YOUR SURGEON.                                                   FOLLOW-UP APPOINTMENTS:  If you do not already have a post-op appointment, please call the office for an appointment to be seen by your surgeon.  Guidelines for how soon to be seen are listed in your "After Visit Summary", but are typically between 1-4 weeks after surgery.  OTHER INSTRUCTIONS:   Knee Replacement:  Do not place pillow under knee, focus on keeping the knee straight while resting. CPM instructions: 0-90 degrees, 2 hours in the morning, 2 hours in the afternoon, and 2 hours in the evening. Place foam block, curve side up under heel at all times except when in CPM or when walking.  DO NOT modify, tear, cut, or change the foam block in any  way.  MAKE SURE YOU:  Understand these instructions.  Get  help right away if you are not doing well or get worse.    Thank you for letting us be a part of your medical care team.  It is a privilege we respect greatly.  We hope these instructions will help you stay on track for a fast and full recovery!   09/24/16 1601   09/24/16 0000  Constipation Prevention    Comments:  Drink plenty of fluids.  Prune juice may be helpful.  You may use a stool softener, such as Colace (over the counter) 100 mg twice a day.  Use MiraLax (over the counter) for constipation as needed.   09/24/16 1601   09/24/16 0000  Diet general     09/24/16 1601   09/24/16 0000  Increase activity slowly as tolerated     09/24/16 1601   09/24/16 0000  Weight bearing as tolerated    Question Answer Comment  Laterality right   Extremity Lower      09/24/16 1601   09/24/16 0000  Patient may shower    Comments:  You may shower over the brown dressing.  DO NOT REMOVE the brown dressing   09/24/16 1601   09/24/16 0000  Driving restrictions    Comments:  No driving for 6 weeks   09/24/16 1601   09/24/16 0000  Lifting restrictions    Comments:  No lifting for 6 weeks   09/24/16 1601   09/24/16 0000  Follow the hip precautions as taught in Physical Therapy     09/24/16 1601   09/24/16 0000  Change dressing    Comments:  DO NOT REMOVE OR CHANGE DRESSING   09/24/16 1601   09/24/16 0000  TED hose    Comments:  Use stockings (TED hose) for 4 weeks on RIGHT leg(s).  You may remove them at night for sleeping.   09/24/16 1601      FOLLOW UP VISIT:   Follow-up Information    [facility] up.   Specialty:  [place] Why:  A representative from [facility] will contact you to arrange start date and time for your therapy. Contact information: [address] [place] [address] [phone]        Garald Balding, MD Follow up on 10/07/2016.   Specialty:  Orthopedic Surgery Contact information: [address] [place]  [address] [phone]           DISPOSITION:   Home  CONDITION:  Stable   Mike Craze. [place], [place] [phone]  09/24/2016 4:03 PM

## 2016-09-24 NOTE — Progress Notes (Addendum)
Physical Therapy Treatment Patient Details Name: Charles [name] MRN: [id number] DOB: 1970-11-03 Today's Date: 09/24/2016    History of Present Illness Pt is a 46 y/o male s/p elective R THA, posterior hip precautions. PMH includes psoriatic arthritis, COPD, chronic back pain, chronic neck pain, and s/p L THA.     PT Comments    Pt performed gait training to correct deficits and improve function.  PTA reviewed supine and seated exercises during session.  Pt tolerated tx well.  Will f/u in pm to review standing exercises and continue gait training.     Follow Up Recommendations  DC plan and follow up therapy as arranged by surgeon;Supervision for mobility/OOB     Equipment Recommendations  None recommended by PT    Recommendations for Other Services OT consult     Precautions / Restrictions Precautions Precautions: Posterior Hip Precaution Booklet Issued: Yes (comment) Precaution Comments: Pt able to recall 3/3 hip precautions  Required Braces or Orthoses: Knee Immobilizer - Right Knee Immobilizer - Right: Other (comment) (until discontinued.  ) Restrictions Weight Bearing Restrictions: Yes RLE Weight Bearing: Weight bearing as tolerated    Mobility  Bed Mobility               General bed mobility comments: Pt received in recliner chair on arrival.    Transfers Overall transfer level: Needs assistance Equipment used: Rolling walker (2 wheeled) Transfers: Sit to/from Stand Sit to Stand: Supervision Stand pivot transfers: Supervision       General transfer comment: Good technique.  Pt remains guarded due to pain supervision for safety.    Ambulation/Gait Ambulation/Gait assistance: Min guard Ambulation Distance (Feet): 250 Feet Assistive device: Rolling walker (2 wheeled) Gait Pattern/deviations: Step-through pattern;Decreased stride length;Trunk flexed;Antalgic;Decreased stance time - right;Decreased step length - left Gait velocity: Decreased Gait velocity  interpretation: Below normal speed for age/gender General Gait Details: Slow, antalgic gait secondary to R hip pain. Pt with improved posture this session. Distance limited secondary to R hip pain.  Cues to increased stride on the L to promote gait symmetry.     Stairs         General stair comments: Pt able to recall sequencing but denied trial as he reports he feels confident from stair negotiation during previous session.    Wheelchair Mobility    Modified Rankin (Stroke Patients Only)       Balance Overall balance assessment: Needs assistance   Sitting balance-[name] Sitting balance - Comments: static sitting EOB      Standing balance-Leahy Scale: Poor Standing balance comment: Reliant on RW for stability.                             Cognition Arousal/Alertness: Awake/alert Behavior During Therapy: WFL for tasks assessed/performed Overall Cognitive Status: Within Functional Limits for tasks assessed                                        Exercises Total Joint Exercises Ankle Circles/Pumps: AROM;Both;10 reps;Supine Quad Sets: AROM;Right;10 reps;Supine Short Arc Quad: AROM;Right;10 reps;Supine Heel Slides: Right;10 reps;Supine;AAROM Hip ABduction/ADduction: Right;10 reps;Supine;AAROM Long Arc Quad: AROM;Right;10 reps;Supine    General Comments        Pertinent Vitals/Pain Pain Assessment: 0-10 Pain Score: 6  Pain Location: R hip  Pain Descriptors / Indicators: Aching;Grimacing;Operative site guarding Pain Intervention(s): Monitored during session;Repositioned;Ice  applied    Home Living                      Prior Function            PT Goals (current goals can now be found in the care plan section) Acute Rehab PT Goals Patient Stated Goal: to decrease pain  Potential to Achieve Goals: Good Progress towards PT goals: Progressing toward goals    Frequency    7X/week      PT Plan Current plan  remains appropriate    Co-evaluation              AM-PAC PT "6 Clicks" Daily Activity  Outcome Measure  Difficulty turning over in bed (including adjusting bedclothes, sheets and blankets)?: A Little Difficulty moving from lying on back to sitting on the side of the bed? : A Little Difficulty sitting down on and standing up from a chair with arms (e.g., wheelchair, bedside commode, etc,.)?: A Little Help needed moving to and from a bed to chair (including a wheelchair)?: A Little Help needed walking in hospital room?: A Little Help needed climbing 3-5 steps with a railing? : A Little 6 Click Score: 18    End of Session Equipment Utilized During Treatment: Gait belt Activity Tolerance: Patient limited by pain Patient left: with call bell/phone within reach;in chair Nurse Communication: Mobility status PT Visit Diagnosis: Other abnormalities of gait and mobility (R26.89);Pain Pain - Right/Left: Right Pain - part of body: Hip     Time: 1220-1240 PT Time Calculation (min) (ACUTE ONLY): 20 min  Charges:  $Gait Training: 8-22 mins                    G Codes:       Governor Rooks, PTA pager [phone]    Cristela Blue 09/24/2016, 3:05 PM

## 2016-09-28 ENCOUNTER — Ambulatory Visit (INDEPENDENT_AMBULATORY_CARE_PROVIDER_SITE_OTHER): Payer: 59 | Admitting: Orthopaedic Surgery

## 2016-09-28 DIAGNOSIS — Z96641 Presence of right artificial hip joint: Secondary | ICD-10-CM

## 2016-09-28 NOTE — Progress Notes (Signed)
Post-Op Visit Note   Patient: Charles [name]           Date of Birth: [birth year]           MRN: [id number] Visit Date: 09/28/2016 PCP: Celene Squibb, MD   Assessment & Plan:  Chief Complaint: No chief complaint on file.  Visit Diagnoses:  1. Status post right hip replacement     Plan: changed right hip dressing today as it "got wet". New Aquacel dressing applied. Wound looks clean and dry without evidence of infection. Patient having a little pain  Follow-Up Instructions: Return in about 1 week (around 10/05/2016).   Orders:  No orders of the defined types were placed in this encounter.  No orders of the defined types were placed in this encounter.   Imaging: No results found.  PMFS History: Patient Active Problem List   Diagnosis Date Noted  . 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 Bilateral sacroiliitis ([place]) 05/22/2016  . Psoriatic arthritis ([place]) 05/22/2016  . High risk medications (not anticoagulants) long-term use 05/22/2016  . DJD (degenerative joint disease), cervical 05/22/2016  . Osteoarthritis of spine with radiculopathy, lumbar region 05/22/2016  . Cephalalgia 03/16/2016  . Chronic daily headache 03/16/2016  . Neck pain 03/16/2016  . Hemorrhoids 03/13/2016  . 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 Insomnia 12/26/2015  . Abdominal pain, epigastric 11/13/2015  . Dark stools 11/13/2015  . Rectal bleeding 11/13/2015  . Numbness 09/26/2015  . 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 Avascular necrosis of left femoral head ([place]) 07/30/2015  . Avascular necrosis of right femoral head ([place]) 07/30/2015  . Psoriasis 07/30/2015  . Chronic obstructive pulmonary disease (COPD) ([place]) 07/30/2015  . 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   Past Medical History:  Diagnosis Date  . Arthritis    also has avascular necrosis   . Chronic back pain   . Chronic neck pain     . Chronic shoulder pain   . COPD (chronic obstructive pulmonary disease) ([place])   . GERD (gastroesophageal reflux disease)    tums  OTC  medication  . Headache   . Hypothyroidism   . Sleep apnea    tested more than 5 yrs ago...negative results    Family History  Problem Relation Age of Onset  . Heart failure Mother   . Diabetes Mother   . Hypertension Mother   . Heart failure Father   . Prostate cancer Father   . Colon cancer Neg Hx     Past Surgical History:  Procedure Laterality Date  . COLONOSCOPY WITH PROPOFOL N/A 12/09/2015   Procedure: COLONOSCOPY WITH PROPOFOL;  Surgeon: Daneil Dolin, MD;  [facility];  Service: Endoscopy;  Laterality: N/A;  1100  . ESOPHAGOGASTRODUODENOSCOPY (EGD) WITH PROPOFOL N/A 12/09/2015   Procedure: ESOPHAGOGASTRODUODENOSCOPY (EGD) WITH PROPOFOL;  Surgeon: Daneil Dolin, MD;  [facility];  Service: Endoscopy;  Laterality: N/A;  . JOINT REPLACEMENT     left hip 2017  . NECK SURGERY    . SHOULDER SURGERY    . TOTAL HIP ARTHROPLASTY Left 07/30/2015   Procedure: TOTAL HIP ARTHROPLASTY;  Surgeon: Garald Balding, MD;  Location: [facility];  Service: Orthopedics;  Laterality: Left;  . TOTAL HIP ARTHROPLASTY Right 09/22/2016  . TOTAL HIP ARTHROPLASTY Right 09/22/2016   Procedure: RIGHT TOTAL HIP ARTHROPLASTY;  Surgeon: Garald Balding, MD;  Location: [facility];  Service:  Orthopedics;  Laterality: Right;   Social History   Occupational History  . Not on file.   Social History Main Topics  . Smoking status: Current Some Day Smoker    Packs/day: 0.50    Years: 25.00    Types: Cigarettes, E-cigarettes    Last attempt to quit: 05/17/2015  . Smokeless tobacco: Never Used     Comment: USES VAPOR CIGARETTES   . Alcohol use 3.6 oz/week    6 Cans of beer per week     Comment: twice a week: 6-7 drinks per occurrance  . Drug use: No     Comment: states quit  . Sexual activity: Not on file

## 2016-10-05 ENCOUNTER — Ambulatory Visit (INDEPENDENT_AMBULATORY_CARE_PROVIDER_SITE_OTHER): Payer: 59

## 2016-10-05 ENCOUNTER — Encounter (INDEPENDENT_AMBULATORY_CARE_PROVIDER_SITE_OTHER): Payer: Self-pay | Admitting: Orthopaedic Surgery

## 2016-10-05 ENCOUNTER — Ambulatory Visit (INDEPENDENT_AMBULATORY_CARE_PROVIDER_SITE_OTHER): Payer: 59 | Admitting: Orthopaedic Surgery

## 2016-10-05 VITALS — BP 115/71 | HR 102 | Resp 12 | Ht 71.0 in | Wt 185.0 lb

## 2016-10-05 DIAGNOSIS — Z96641 Presence of right artificial hip joint: Secondary | ICD-10-CM

## 2016-10-05 DIAGNOSIS — Z96643 Presence of artificial hip joint, bilateral: Secondary | ICD-10-CM

## 2016-10-05 NOTE — Progress Notes (Signed)
Office Visit Note   Patient: Charles [name]           Date of Birth: 26-Jun-1970           MRN: [id number] Visit Date: 10/05/2016              Requested by: Celene Squibb, MD [address] [place], [place] [address] PCP: Celene Squibb, MD   Assessment & Plan: Visit Diagnoses:  1. Presence of both artificial hip joints   2. Status post right hip replacement     Plan: office 2 weeks. Finish course of xarelto. Progress to a cane  Follow-Up Instructions: Return in about 2 weeks (around 10/19/2016).   Orders:  Orders Placed This Encounter  Procedures  . XR HIP UNILAT W OR W/O PELVIS 2-3 VIEWS RIGHT   No orders of the defined types were placed in this encounter.     Procedures: No procedures performed   Clinical Data: No additional findings.   Subjective: Chief Complaint  Patient presents with  . Right Hip - Routine Post Op    Charles [name] is a 46 y o S/P 2 weeks Right THA. Staples removed, pt ambulating on a walker. Relates he is sore and achy.   no fevers or chills. No numbness or tingling in right lower extremity. Pain is much less. Relates preoperative pain has resolved  HPI  Review of Systems  Constitutional: Negative for fatigue.  HENT: Negative for hearing loss.   Respiratory: Negative for apnea, chest tightness and shortness of breath.   Cardiovascular: Negative for chest pain, palpitations and leg swelling.  Gastrointestinal: Negative for blood in stool, constipation and diarrhea.  Genitourinary: Negative for difficulty urinating.  Musculoskeletal: Positive for back pain. Negative for arthralgias, joint swelling, myalgias, neck pain and neck stiffness.  Neurological: Negative for weakness, numbness and headaches.  Hematological: Does not bruise/bleed easily.  Psychiatric/Behavioral: Negative for sleep disturbance. The patient is not nervous/anxious.      Objective: Vital Signs: BP 115/71   Pulse (!) 102   Resp 12   Ht 5\' 11"  (1.803 m)   Wt 185 lb  (83.9 kg)   BMI 25.80 kg/m   Physical Exam  Ortho Examright hip incision healing without problem. Steri-Strips applied after stitches removed. Neurovascular exam intact. Leg lengths symmetrical  Specialty Comments:  No specialty comments available.  Imaging: No results found.   PMFS History: Patient Active Problem List   Diagnosis Date Noted  . 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 Bilateral sacroiliitis ([place]) 05/22/2016  . Psoriatic arthritis ([place]) 05/22/2016  . High risk medications (not anticoagulants) long-term use 05/22/2016  . DJD (degenerative joint disease), cervical 05/22/2016  . Osteoarthritis of spine with radiculopathy, lumbar region 05/22/2016  . Cephalalgia 03/16/2016  . Chronic daily headache 03/16/2016  . Neck pain 03/16/2016  . Hemorrhoids 03/13/2016  . 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 Insomnia 12/26/2015  . Abdominal pain, epigastric 11/13/2015  . Dark stools 11/13/2015  . Rectal bleeding 11/13/2015  . Numbness 09/26/2015  . 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 Avascular necrosis of left femoral head ([place]) 07/30/2015  . Avascular necrosis of right femoral head ([place]) 07/30/2015  . Psoriasis 07/30/2015  . Chronic obstructive pulmonary disease (COPD) ([place]) 07/30/2015  . 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   Past Medical History:  Diagnosis Date  . Arthritis    also has avascular necrosis   .  Chronic back pain   . Chronic neck pain   . Chronic shoulder pain   . COPD (chronic obstructive pulmonary disease) ([place])   . GERD (gastroesophageal reflux disease)    tums  OTC  medication  . Headache   . Hypothyroidism   . Sleep apnea    tested more than 5 yrs ago...negative results    Family History  Problem Relation Age of Onset  . Heart failure Mother   . Diabetes Mother   . Hypertension Mother   . Heart failure Father   . Prostate cancer  Father   . Colon cancer Neg Hx     Past Surgical History:  Procedure Laterality Date  . COLONOSCOPY WITH PROPOFOL N/A 12/09/2015   Procedure: COLONOSCOPY WITH PROPOFOL;  Surgeon: Daneil Dolin, MD;  [facility];  Service: Endoscopy;  Laterality: N/A;  1100  . ESOPHAGOGASTRODUODENOSCOPY (EGD) WITH PROPOFOL N/A 12/09/2015   Procedure: ESOPHAGOGASTRODUODENOSCOPY (EGD) WITH PROPOFOL;  Surgeon: Daneil Dolin, MD;  [facility];  Service: Endoscopy;  Laterality: N/A;  . JOINT REPLACEMENT     left hip 2017  . NECK SURGERY    . SHOULDER SURGERY    . TOTAL HIP ARTHROPLASTY Left 07/30/2015   Procedure: TOTAL HIP ARTHROPLASTY;  Surgeon: Garald Balding, MD;  Location: [facility];  Service: Orthopedics;  Laterality: Left;  . TOTAL HIP ARTHROPLASTY Right 09/22/2016  . TOTAL HIP ARTHROPLASTY Right 09/22/2016   Procedure: RIGHT TOTAL HIP ARTHROPLASTY;  Surgeon: Garald Balding, MD;  Location: [facility];  Service: Orthopedics;  Laterality: Right;   Social History   Occupational History  . Not on file.   Social History Main Topics  . Smoking status: Current Some Day Smoker    Packs/day: 0.50    Years: 25.00    Types: Cigarettes, E-cigarettes    Last attempt to quit: 05/17/2015  . Smokeless tobacco: Never Used     Comment: USES VAPOR CIGARETTES   . Alcohol use 3.6 oz/week    6 Cans of beer per week     Comment: twice a week: 6-7 drinks per occurrance  . Drug use: No     Comment: states quit  . Sexual activity: Not on file

## 2016-10-09 ENCOUNTER — Telehealth: Payer: Self-pay | Admitting: Rheumatology

## 2016-10-09 NOTE — Telephone Encounter (Signed)
Zen with [employer] calling to see if there has been any changes in treatment for patient. (i could not understand name of medication) 150mg  rx. Please call to advise [phone]

## 2016-10-09 NOTE — Telephone Encounter (Signed)
Advised the pharmacy that patient is still on the Cosentyx. Advised it has been held due to surgery and patient will resume once released by surgeon.

## 2016-10-19 ENCOUNTER — Ambulatory Visit (INDEPENDENT_AMBULATORY_CARE_PROVIDER_SITE_OTHER): Payer: 59 | Admitting: Orthopaedic Surgery

## 2016-10-19 ENCOUNTER — Encounter (INDEPENDENT_AMBULATORY_CARE_PROVIDER_SITE_OTHER): Payer: Self-pay | Admitting: Orthopaedic Surgery

## 2016-10-19 VITALS — BP 129/76 | HR 80 | Ht 72.0 in | Wt 200.0 lb

## 2016-10-19 DIAGNOSIS — Z96641 Presence of right artificial hip joint: Secondary | ICD-10-CM

## 2016-10-19 NOTE — Progress Notes (Signed)
Office Visit Note   Patient: Charles [name]           Date of Birth: 24-Jul-1970           MRN: [id number] Visit Date: 10/19/2016              Requested by: Celene Squibb, MD [address] [place], [place] [address] PCP: Celene Squibb, MD   Assessment & Plan: Visit Diagnoses:  1. Status post right hip replacement     Plan: Doing very well in terms of his right hip replacement now 1 month postop. Uses a single-point cane. Happy with the results. Continue no work and plan to see in office 1 month okay to drive  Follow-Up Instructions: Return in about 1 month (around 11/19/2016).   Orders:  No orders of the defined types were placed in this encounter.  No orders of the defined types were placed in this encounter.     Procedures: No procedures performed   Clinical Data: No additional findings.   Subjective: No chief complaint on file. Denies fever or chills shortness of breath or chest pain. Presently using a single-point cane for ambulation. Some "soreness" in his right thigh and hip but not the pain that he had preoperatively. He feels like his leg lengths are symmetrical. Denies any distal edema  HPI  Review of Systems   Objective: Vital Signs: BP 129/76 (BP Location: Left Arm, Patient Position: Sitting, Cuff Size: Normal)   Pulse 80   Ht 6' (1.829 m)   Wt 200 lb (90.7 kg)   BMI 27.12 kg/m   Physical Exam  Ortho Exam right hip incision clean and dry. Painless range of motion of right hip with gentle internal/external rotation. Leg lengths appear to be symmetrical. Neurovascular exam intact. No calf pain. No distal edema. No related thigh pain. Walks with a minimal limp  Specialty Comments:  No specialty comments available.  Imaging: No results found.   PMFS History: Patient Active Problem List   Diagnosis Date Noted  . 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 Bilateral sacroiliitis ([place]) 05/22/2016  . Psoriatic arthritis ([place])  05/22/2016  . High risk medications (not anticoagulants) long-term use 05/22/2016  . DJD (degenerative joint disease), cervical 05/22/2016  . Osteoarthritis of spine with radiculopathy, lumbar region 05/22/2016  . Cephalalgia 03/16/2016  . Chronic daily headache 03/16/2016  . Neck pain 03/16/2016  . Hemorrhoids 03/13/2016  . 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 Insomnia 12/26/2015  . Abdominal pain, epigastric 11/13/2015  . Dark stools 11/13/2015  . Rectal bleeding 11/13/2015  . Numbness 09/26/2015  . 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 Avascular necrosis of left femoral head ([place]) 07/30/2015  . Avascular necrosis of right femoral head ([place]) 07/30/2015  . Psoriasis 07/30/2015  . Chronic obstructive pulmonary disease (COPD) ([place]) 07/30/2015  . 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   Past Medical History:  Diagnosis Date  . Arthritis    also has avascular necrosis   . Chronic back pain   . Chronic neck pain   . Chronic shoulder pain   . COPD (chronic obstructive pulmonary disease) ([place])   . GERD (gastroesophageal reflux disease)    tums  OTC  medication  . Headache   . Hypothyroidism   . Sleep apnea    tested more than 5 yrs ago...negative results    Family History  Problem Relation Age of Onset  .  Heart failure Mother   . Diabetes Mother   . Hypertension Mother   . Heart failure Father   . Prostate cancer Father   . Colon cancer Neg Hx     Past Surgical History:  Procedure Laterality Date  . COLONOSCOPY WITH PROPOFOL N/A 12/09/2015   Procedure: COLONOSCOPY WITH PROPOFOL;  Surgeon: Daneil Dolin, MD;  [facility];  Service: Endoscopy;  Laterality: N/A;  1100  . ESOPHAGOGASTRODUODENOSCOPY (EGD) WITH PROPOFOL N/A 12/09/2015   Procedure: ESOPHAGOGASTRODUODENOSCOPY (EGD) WITH PROPOFOL;  Surgeon: Daneil Dolin, MD;  [facility];  Service: Endoscopy;  Laterality:  N/A;  . JOINT REPLACEMENT     left hip 2017  . NECK SURGERY    . SHOULDER SURGERY    . TOTAL HIP ARTHROPLASTY Left 07/30/2015   Procedure: TOTAL HIP ARTHROPLASTY;  Surgeon: Garald Balding, MD;  Location: [facility];  Service: Orthopedics;  Laterality: Left;  . TOTAL HIP ARTHROPLASTY Right 09/22/2016  . TOTAL HIP ARTHROPLASTY Right 09/22/2016   Procedure: RIGHT TOTAL HIP ARTHROPLASTY;  Surgeon: Garald Balding, MD;  Location: [facility];  Service: Orthopedics;  Laterality: Right;   Social History   Occupational History  . Not on file.   Social History Main Topics  . Smoking status: Current Some Day Smoker    Packs/day: 0.50    Years: 25.00    Types: Cigarettes, E-cigarettes    Last attempt to quit: 05/17/2015  . Smokeless tobacco: Never Used     Comment: USES VAPOR CIGARETTES   . Alcohol use 3.6 oz/week    6 Cans of beer per week     Comment: twice a week: 6-7 drinks per occurrance  . Drug use: No     Comment: states quit  . Sexual activity: Not on file     Garald Balding, MD   Note - This record has been created using [employer].  Chart creation errors have been sought, but may not always  have been located. Such creation errors do not reflect on  the standard of medical care.

## 2016-11-05 ENCOUNTER — Telehealth: Payer: Self-pay

## 2016-11-05 NOTE — Telephone Encounter (Signed)
Last visit: 06/04/2016 Next visit: message sent to front to schedule follow up.  Labs: 09/10/2016 stable  TB Gold: 02/04/2016 Negative   Ok to refill 30 day supply of cosentyx?

## 2016-11-06 NOTE — Telephone Encounter (Signed)
ok 

## 2016-11-10 MED ORDER — SECUKINUMAB 150 MG/ML ~~LOC~~ SOAJ: 300.0000 mg | SUBCUTANEOUS | 0 refills | Status: DC

## 2016-11-16 ENCOUNTER — Ambulatory Visit (INDEPENDENT_AMBULATORY_CARE_PROVIDER_SITE_OTHER): Payer: 59 | Admitting: Orthopaedic Surgery

## 2016-11-16 ENCOUNTER — Encounter (INDEPENDENT_AMBULATORY_CARE_PROVIDER_SITE_OTHER): Payer: Self-pay | Admitting: Orthopaedic Surgery

## 2016-11-16 VITALS — Resp 14 | Ht 72.0 in | Wt 200.0 lb

## 2016-11-16 DIAGNOSIS — Z96641 Presence of right artificial hip joint: Secondary | ICD-10-CM

## 2016-11-16 NOTE — Progress Notes (Signed)
Office Visit Note   Patient: Charles [name]           Date of Birth: 05/07/1970           MRN: [id number] Visit Date: 11/16/2016              Requested by: Celene Squibb, MD [address] [place], [place] [address] PCP: Celene Squibb, MD   Assessment & Plan: Visit Diagnoses:  1. Status post right hip replacement     Plan: 2 months status post primary right total hip replacement doing well. Over the weekend he nearly stepped on a snake and had to "jerked back" settled but the soreness since that time but otherwise doing fine. He does have history of rheumatoid arthritis and like to start back on his cosentyx. I will check with Dr. Estanislado Pandy. See back in 3-6 months and urged him to continue with his exercises. No work for at least one more month  Follow-Up Instructions: Return in about 6 months (around 05/16/2017).   Orders:  No orders of the defined types were placed in this encounter.  No orders of the defined types were placed in this encounter.     Procedures: No procedures performed   Clinical Data: No additional findings.   Subjective: Chief Complaint  Patient presents with  . Right Hip - Routine Post Op    Charles [name] is a 46 y o S/P 6 weeks Right total hip replacement. He relates his hip hurts at the site. He still cannot bend or squat.  No history of fever, chills, shortness of breath or chest pain. Not using any ambulatory aid. Has a history of rheumatoid arthritis He would like to start back on his meds. Will check with Dr. Estanislado Pandy. He does not have any of  the pain that he had preoperatively. His leg lengths feel symmetrical.  HPI  Review of Systems  Constitutional: Negative for fatigue.  HENT: Negative for hearing loss.   Respiratory: Negative for apnea, chest tightness and shortness of breath.   Cardiovascular: Negative for chest pain, palpitations and leg swelling.  Gastrointestinal: Negative for blood in stool, constipation and diarrhea.    Genitourinary: Negative for difficulty urinating.  Musculoskeletal: Positive for arthralgias. Negative for back pain, joint swelling, myalgias, neck pain and neck stiffness.  Neurological: Negative for weakness, numbness and headaches.  Hematological: Does not bruise/bleed easily.  Psychiatric/Behavioral: Positive for sleep disturbance. The patient is not nervous/anxious.      Objective: Vital Signs: Resp 14   Ht 6' (1.829 m)   Wt 200 lb (90.7 kg)   BMI 27.12 kg/m   Physical Exam  Ortho Exam awake alert and oriented 3. Comfortable sitting. Painless range of motion of right hip. Long leg lengths symmetrical. Neurovascular exam intact. No pain about his incision.  Specialty Comments:  No specialty comments available.  Imaging: No results found.   PMFS History: Patient Active Problem List   Diagnosis Date Noted  . 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 Bilateral sacroiliitis ([place]) 05/22/2016  . Psoriatic arthritis ([place]) 05/22/2016  . High risk medications (not anticoagulants) long-term use 05/22/2016  . DJD (degenerative joint disease), cervical 05/22/2016  . Osteoarthritis of spine with radiculopathy, lumbar region 05/22/2016  . Cephalalgia 03/16/2016  . Chronic daily headache 03/16/2016  . Neck pain 03/16/2016  . Hemorrhoids 03/13/2016  . 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 Insomnia 12/26/2015  . Abdominal pain, epigastric 11/13/2015  .  Dark stools 11/13/2015  . Rectal bleeding 11/13/2015  . Numbness 09/26/2015  . 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 Avascular necrosis of left femoral head ([place]) 07/30/2015  . Avascular necrosis of right femoral head ([place]) 07/30/2015  . Psoriasis 07/30/2015  . Chronic obstructive pulmonary disease (COPD) ([place]) 07/30/2015  . 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   Past Medical History:  Diagnosis Date  . Arthritis    also  has avascular necrosis   . Chronic back pain   . Chronic neck pain   . Chronic shoulder pain   . COPD (chronic obstructive pulmonary disease) ([place])   . GERD (gastroesophageal reflux disease)    tums  OTC  medication  . Headache   . Hypothyroidism   . Sleep apnea    tested more than 5 yrs ago...negative results    Family History  Problem Relation Age of Onset  . Heart failure Mother   . Diabetes Mother   . Hypertension Mother   . Heart failure Father   . Prostate cancer Father   . Colon cancer Neg Hx     Past Surgical History:  Procedure Laterality Date  . JOINT REPLACEMENT     left hip 2017  . NECK SURGERY    . SHOULDER SURGERY    . TOTAL HIP ARTHROPLASTY Right 09/22/2016   Social History   Occupational History  . Not on file  Tobacco Use  . Smoking status: Former Smoker    Packs/day: 0.50    Years: 25.00    Pack years: 12.50    Types: Cigarettes, E-cigarettes    Last attempt to quit: 11/03/2016    Years since quitting: 0.0  . Smokeless tobacco: Never Used  . Tobacco comment: USES VAPOR CIGARETTES   Substance and Sexual Activity  . Alcohol use: Yes    Alcohol/week: 3.6 oz    Types: 6 Cans of beer per week    Comment: twice a week: 6-7 drinks per occurrance  . Drug use: No    Comment: states quit  . Sexual activity: Not on file

## 2016-11-19 ENCOUNTER — Encounter: Payer: Self-pay | Admitting: Internal Medicine

## 2016-12-14 ENCOUNTER — Ambulatory Visit (INDEPENDENT_AMBULATORY_CARE_PROVIDER_SITE_OTHER): Payer: 59 | Admitting: Orthopaedic Surgery

## 2016-12-17 ENCOUNTER — Ambulatory Visit (INDEPENDENT_AMBULATORY_CARE_PROVIDER_SITE_OTHER): Payer: 59 | Admitting: Orthopaedic Surgery

## 2016-12-17 ENCOUNTER — Encounter (INDEPENDENT_AMBULATORY_CARE_PROVIDER_SITE_OTHER): Payer: Self-pay | Admitting: Orthopaedic Surgery

## 2016-12-17 VITALS — BP 143/81 | HR 90 | Resp 14 | Ht 72.0 in | Wt 200.0 lb

## 2016-12-17 DIAGNOSIS — Z96641 Presence of right artificial hip joint: Secondary | ICD-10-CM | POA: Diagnosis not present

## 2016-12-17 NOTE — Progress Notes (Signed)
Office Visit Note   Patient: Charles [name]           Date of Birth: February 12, 1970           MRN: [id number] Visit Date: 12/17/2016              Requested by: Celene Squibb, MD [place], [place] [address] PCP: Celene Squibb, MD   Assessment & Plan: Visit Diagnoses:  1. Status post total replacement of right hip     Plan:  #1: At this time he may return to light duty at work. We'll see him back in 3 months for recheck evaluation. At that time decide upon further advancement of work  Follow-Up Instructions: Return in about 3 months (around 03/17/2017).   Orders:  No orders of the defined types were placed in this encounter.  No orders of the defined types were placed in this encounter.     Procedures: No procedures performed   Clinical Data: No additional findings.   Subjective: Chief Complaint  Patient presents with  . Right Hip - Routine Post Op    Charles [name] is a 46 y o S/P 3 months Right hip replacement. He relates soreness in his right thigh. Pt has returned to work last Tuesday.(12/15/16)    HPI  [name] in today for evaluation of his right total hip replacement. He is little over 3 months status post his surgery without much in the way of any problems. He did state he had 1 episode where he twisted and had some pain but this was mainly along the trochanteric region laterally. In the hip or groin area. He is already gone back to work light duty. He overall is doing well. Denies any adverse effects at this time. Ambulate without any assistive devices.  Review of Systems  Constitutional: Negative for fatigue.  HENT: Negative for hearing loss.   Respiratory: Negative for apnea, chest tightness and shortness of breath.   Cardiovascular: Negative for chest pain, palpitations and leg swelling.  Gastrointestinal: Negative for blood in stool, constipation and diarrhea.  Genitourinary: Negative for difficulty urinating.    Musculoskeletal: Negative for arthralgias, back pain, joint swelling, myalgias, neck pain and neck stiffness.  Neurological: Negative for weakness, numbness and headaches.  Hematological: Does not bruise/bleed easily.  Psychiatric/Behavioral: Positive for sleep disturbance. The patient is not nervous/anxious.      Objective: Vital Signs: BP (!) 143/81   Pulse 90   Resp 14   Ht 6' (1.829 m)   Wt 200 lb (90.7 kg)   BMI 27.12 kg/m   Physical Exam  Constitutional: He is oriented to person, place, and time. He appears well-developed and well-nourished.  HENT:  Head: Normocephalic and atraumatic.  Eyes: EOM are normal. Pupils are equal, round, and reactive to light.  Pulmonary/Chest: Effort normal.  Neurological: He is alert and oriented to person, place, and time.  Skin: Skin is warm and dry.  Psychiatric: He has a normal mood and affect. His behavior is normal. Judgment and thought content normal.    Ortho Exam  Today the hip reveals a wound healing problems no signs of infection. He's got good internal and external rotation about 20 to 30. No pain. I can flex him to 110. Abduction to around 20. Adduction 20. Neurovascular intact distally.  Specialty Comments:  No specialty comments available.  Imaging: No results found.   PMFS History: Patient Active Problem List   Diagnosis  Date Noted  . 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 Bilateral sacroiliitis ([place]) 05/22/2016  . Psoriatic arthritis ([place]) 05/22/2016  . High risk medications (not anticoagulants) long-term use 05/22/2016  . DJD (degenerative joint disease), cervical 05/22/2016  . Osteoarthritis of spine with radiculopathy, lumbar region 05/22/2016  . Cephalalgia 03/16/2016  . Chronic daily headache 03/16/2016  . Neck pain 03/16/2016  . Hemorrhoids 03/13/2016  . 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 Insomnia 12/26/2015  . Abdominal pain, epigastric 11/13/2015   . Dark stools 11/13/2015  . Rectal bleeding 11/13/2015  . Numbness 09/26/2015  . 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 Avascular necrosis of left femoral head ([place]) 07/30/2015  . Avascular necrosis of right femoral head ([place]) 07/30/2015  . Psoriasis 07/30/2015  . Chronic obstructive pulmonary disease (COPD) ([place]) 07/30/2015  . 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   Past Medical History:  Diagnosis Date  . Arthritis    also has avascular necrosis   . Chronic back pain   . Chronic neck pain   . Chronic shoulder pain   . COPD (chronic obstructive pulmonary disease) ([place])   . GERD (gastroesophageal reflux disease)    tums  OTC  medication  . Headache   . Hypothyroidism   . Sleep apnea    tested more than 5 yrs ago...negative results    Family History  Problem Relation Age of Onset  . Heart failure Mother   . Diabetes Mother   . Hypertension Mother   . Heart failure Father   . Prostate cancer Father   . Colon cancer Neg Hx     Past Surgical History:  Procedure Laterality Date  . COLONOSCOPY WITH PROPOFOL N/A 12/09/2015   Procedure: COLONOSCOPY WITH PROPOFOL;  Surgeon: Daneil Dolin, MD;  [facility];  Service: Endoscopy;  Laterality: N/A;  1100  . ESOPHAGOGASTRODUODENOSCOPY (EGD) WITH PROPOFOL N/A 12/09/2015   Procedure: ESOPHAGOGASTRODUODENOSCOPY (EGD) WITH PROPOFOL;  Surgeon: Daneil Dolin, MD;  [facility];  Service: Endoscopy;  Laterality: N/A;  . JOINT REPLACEMENT     left hip 2017  . NECK SURGERY    . SHOULDER SURGERY    . TOTAL HIP ARTHROPLASTY Left 07/30/2015   Procedure: TOTAL HIP ARTHROPLASTY;  Surgeon: Garald Balding, MD;  Location: [facility];  Service: Orthopedics;  Laterality: Left;  . TOTAL HIP ARTHROPLASTY Right 09/22/2016  . TOTAL HIP ARTHROPLASTY Right 09/22/2016   Procedure: RIGHT TOTAL HIP ARTHROPLASTY;  Surgeon: Garald Balding, MD;  Location: [facility];  Service: Orthopedics;   Laterality: Right;   Social History   Occupational History  . Not on file  Tobacco Use  . Smoking status: Former Smoker    Packs/day: 0.50    Years: 25.00    Pack years: 12.50    Types: Cigarettes, E-cigarettes    Last attempt to quit: 11/03/2016    Years since quitting: 0.1  . Smokeless tobacco: Never Used  . Tobacco comment: USES VAPOR CIGARETTES   Substance and Sexual Activity  . Alcohol use: Yes    Alcohol/week: 3.6 oz    Types: 6 Cans of beer per week    Comment: twice a week: 6-7 drinks per occurrance  . Drug use: No    Comment: states quit  . Sexual activity: Not on file

## 2016-12-21 ENCOUNTER — Other Ambulatory Visit: Payer: Self-pay | Admitting: *Deleted

## 2016-12-21 MED ORDER — SECUKINUMAB 150 MG/ML ~~LOC~~ SOAJ: 300.0000 mg | SUBCUTANEOUS | 0 refills | Status: DC

## 2016-12-21 NOTE — Telephone Encounter (Signed)
Refill request received via fax  Last Visit: 06/04/16 Next Visit:01/08/17 Labs: 09/24/16 stable TB Gold:02/04/16 Neg  Okay to refill per Dr. Estanislado Pandy

## 2016-12-25 NOTE — Progress Notes (Deleted)
Office Visit Note  Patient: Charles [name]             Date of Birth: 1970-12-09           MRN: [id number]             PCP: Celene Squibb, MD Referring: Celene Squibb, MD Visit Date: 01/08/2017 Occupation: @GUAROCC @    Subjective:  No chief complaint on file.   History of Present Illness: Charles [name] is a 46 y.o. male ***   Activities of Daily Living:  Patient reports morning stiffness for *** {minute/hour:19697}.   Patient {ACTIONS;DENIES/REPORTS:21021675::"Denies"} nocturnal pain.  Difficulty dressing/grooming: {ACTIONS;DENIES/REPORTS:21021675::"Denies"} Difficulty climbing stairs: {ACTIONS;DENIES/REPORTS:21021675::"Denies"} Difficulty getting out of chair: {ACTIONS;DENIES/REPORTS:21021675::"Denies"} Difficulty using hands for taps, buttons, cutlery, and/or writing: {ACTIONS;DENIES/REPORTS:21021675::"Denies"}   No Rheumatology ROS completed.   PMFS History:  Patient Active Problem List   Diagnosis Date Noted  . 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 Bilateral sacroiliitis ([place]) 05/22/2016  . Psoriatic arthritis ([place]) 05/22/2016  . High risk medications (not anticoagulants) long-term use 05/22/2016  . DJD (degenerative joint disease), cervical 05/22/2016  . Osteoarthritis of spine with radiculopathy, lumbar region 05/22/2016  . Cephalalgia 03/16/2016  . Chronic daily headache 03/16/2016  . Neck pain 03/16/2016  . Hemorrhoids 03/13/2016  . 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 Insomnia 12/26/2015  . Abdominal pain, epigastric 11/13/2015  . Dark stools 11/13/2015  . Rectal bleeding 11/13/2015  . Numbness 09/26/2015  . 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 Avascular necrosis of left femoral head ([place]) 07/30/2015  . Avascular necrosis of right femoral head ([place]) 07/30/2015  . Psoriasis 07/30/2015  . Chronic obstructive pulmonary disease (COPD) ([place]) 07/30/2015  . Status post total  replacement of left hip 07/30/2015    Past Medical History:  Diagnosis Date  . Arthritis    also has avascular necrosis   . Chronic back pain   . Chronic neck pain   . Chronic shoulder pain   . COPD (chronic obstructive pulmonary disease) ([place])   . GERD (gastroesophageal reflux disease)    tums  OTC  medication  . Headache   . Hypothyroidism   . Sleep apnea    tested more than 5 yrs ago...negative results    Family History  Problem Relation Age of Onset  . Heart failure Mother   . Diabetes Mother   . Hypertension Mother   . Heart failure Father   . Prostate cancer Father   . Colon cancer Neg Hx    Past Surgical History:  Procedure Laterality Date  . COLONOSCOPY WITH PROPOFOL N/A 12/09/2015   Procedure: COLONOSCOPY WITH PROPOFOL;  Surgeon: Daneil Dolin, MD;  [facility];  Service: Endoscopy;  Laterality: N/A;  1100  . ESOPHAGOGASTRODUODENOSCOPY (EGD) WITH PROPOFOL N/A 12/09/2015   Procedure: ESOPHAGOGASTRODUODENOSCOPY (EGD) WITH PROPOFOL;  Surgeon: Daneil Dolin, MD;  [facility];  Service: Endoscopy;  Laterality: N/A;  . JOINT REPLACEMENT     left hip 2017  . NECK SURGERY    . SHOULDER SURGERY    . TOTAL HIP ARTHROPLASTY Left 07/30/2015   Procedure: TOTAL HIP ARTHROPLASTY;  Surgeon: Garald Balding, MD;  Location: [facility];  Service: Orthopedics;  Laterality: Left;  . TOTAL HIP ARTHROPLASTY Right 09/22/2016  . TOTAL HIP ARTHROPLASTY Right 09/22/2016   Procedure: RIGHT TOTAL HIP ARTHROPLASTY;  Surgeon: Garald Balding, MD;  Location: [facility];  Service: Orthopedics;  Laterality: Right;   Social History   Social History Narrative  . Not on file     Objective: Vital Signs: There were no vitals taken for this visit.   Physical Exam   Musculoskeletal Exam: ***  CDAI Exam: No CDAI exam completed.    Investigation: No additional findings.TB Gold: 02/04/2016 Negative  CBC Latest Ref Rng & Units 09/24/2016 09/23/2016 09/10/2016  WBC 4.0 - 10.5  K/uL 10.0 8.1 6.4  Hemoglobin 13.0 - 17.0 g/dL 11.7(L) 12.5(L) 16.3  Hematocrit 39.0 - 52.0 % 35.0(L) 37.5(L) 46.9  Platelets 150 - 400 K/uL 128(L) 131(L) 143(L)   CMP Latest Ref Rng & Units 09/24/2016 09/23/2016 09/10/2016  Glucose 65 - 99 mg/dL 128(H) 100(H) 98  BUN 6 - 20 mg/dL 9 15 13   Creatinine 0.61 - 1.24 mg/dL 0.86 1.05 1.00  Sodium 135 - 145 mmol/L 135 136 139  Potassium 3.5 - 5.1 mmol/L 3.7 4.0 4.0  Chloride 101 - 111 mmol/L 105 106 105  CO2 22 - 32 mmol/L 24 25 23   Calcium 8.9 - 10.3 mg/dL 8.5(L) 8.4(L) 9.5  Total Protein 6.5 - 8.1 g/dL - - 7.0  Total Bilirubin 0.3 - 1.2 mg/dL - - 0.9  Alkaline Phos 38 - 126 U/L - - 44  AST 15 - 41 U/L - - 25  ALT 17 - 63 U/L - - 21    Imaging: No results found.  Speciality Comments: No specialty comments available.    Procedures:  No procedures performed Allergies: Patient has no known allergies.   Assessment / Plan:     Visit Diagnoses: No diagnosis found.    Orders: No orders of the defined types were placed in this encounter.  No orders of the defined types were placed in this encounter.   Face-to-face time spent with patient was *** minutes. 50% of time was spent in counseling and coordination of care.  Follow-Up Instructions: No Follow-up on file.   Earnestine Mealing, CMA  Note - This record has been created using Editor, commissioning.  Chart creation errors have been sought, but may not always  have been located. Such creation errors do not reflect on  the standard of medical care.

## 2017-01-07 ENCOUNTER — Telehealth: Payer: Self-pay

## 2017-01-07 NOTE — Telephone Encounter (Signed)
Received a prior authorization renewal request for Cosentyx. Attempted to submit authorization through cover my meds. Was not able to locate pts plan with insurance through protal. Called [employer] to verify coverage. Pts. Coverage ended on 01/04/2017.   Called patient to get updated information. Left voice mail.  Charles [name], [place], CPhT 4:51 PM

## 2017-01-08 ENCOUNTER — Ambulatory Visit: Payer: 59 | Admitting: Rheumatology

## 2017-01-14 ENCOUNTER — Telehealth: Payer: Self-pay

## 2017-01-14 NOTE — Telephone Encounter (Signed)
Located pts insurance in the [facility] [employer]. Patient has [place]: [id number] PCN: [id number] ID: [id number] Phone: [phone]  Called insurance. Spoke with Roxanne who was able to process the authorization urgently over the phone. We should have a response within 24-72 business hours. Will update once we receive a response.   Elysabeth Aust, [place], CPhT 2:20 PM

## 2017-01-15 ENCOUNTER — Telehealth: Payer: Self-pay

## 2017-01-15 NOTE — Telephone Encounter (Signed)
Ok to appeal

## 2017-01-15 NOTE — Telephone Encounter (Signed)
Received a fax from [employer] (pts new insurance) regarding a prior authorization DENIAL for [place] because pts has not had a documented failure, inadequate response, contraindication, intolerance or not a candidate for one DMARD and two preferred products (enbrel,humira, remicade, stelara).   Pt has tried and failed Enbrel. Pt was on Cosentyx from 06/17/16 through 10/2016 (Stopped due to surgery). Should we try to appeal this or change his medication? Please advise pt.  Appeals can be submitted to : [employer] [address] [place] [place] [address] Phone: [phone] Fax: [phone] *expedited request must have "Expedited" written on the faxed forms.  Reference [id number] Phone number:[phone]  Will send document to scan center.  @[email or web address] @  10:09 AM

## 2017-01-15 NOTE — Telephone Encounter (Signed)
Error

## 2017-01-15 NOTE — Progress Notes (Signed)
Office Visit Note  Patient: Charles [name]             Date of Birth: October 10, 1970           MRN: [id number]             PCP: Celene Squibb, MD Referring: Celene Squibb, MD Visit Date: 01/18/2017 Occupation: @GUAROCC @    Subjective:  SI joint pain    History of Present Illness: Charles [name] is a 47 y.o. male with history of psoriatic arthritis and bilateral sacroiliitis.  Patient states he is having severe SI joint pain bilaterally worse on the left.  He is experiencing radiation of pain down the lateral aspect of his left leg.  He states the pain is not being controlled by Percocet.  He states he is also having increased stiffness of his lower back.  He states the pain has been waking him up at night.  He denies any hand pain or swelling.  He has no active psoriasis.  No joint swelling.  He states that he had a right hip replacement by Dr. Durward Fortes in September 2018.  He states his bilateral hips are doing well.  He held his Cosentyx 1 month prior to the surgery and about 1.5 months following the surgery.  He has had 2 doses since restarting.  He states that while on Cosentyx he has not noticed any benefit.  He previously failed Enbrel (first injection was May 2017).  First visit was March 2017.     Activities of Daily Living:  Patient reports morning stiffness for 1 hour.   Patient Reports nocturnal pain.  Difficulty dressing/grooming: Denies Difficulty climbing stairs: Denies Difficulty getting out of chair: Denies Difficulty using hands for taps, buttons, cutlery, and/or writing: Denies   Review of Systems  Constitutional: Positive for fatigue. Negative for night sweats and weakness.  HENT: Negative for mouth sores, mouth dryness and nose dryness.   Eyes: Negative for redness and dryness.  Respiratory: Negative for cough, hemoptysis, shortness of breath and difficulty breathing.   Cardiovascular: Negative for chest pain, palpitations, hypertension, irregular heartbeat and swelling  in legs/feet.  Gastrointestinal: Negative for blood in stool, constipation and diarrhea.  Endocrine: Negative for increased urination.  Genitourinary: Negative for painful urination.  Musculoskeletal: Positive for arthralgias, joint pain and morning stiffness. Negative for joint swelling, myalgias, muscle weakness, muscle tenderness and myalgias.  Skin: Negative for color change, rash, hair loss, nodules/bumps, skin tightness, ulcers and sensitivity to sunlight.  Allergic/Immunologic: Negative for susceptible to infections.  Neurological: Negative for dizziness, fainting, memory loss and night sweats.  Hematological: Negative for swollen glands.  Psychiatric/Behavioral: Positive for sleep disturbance. Negative for depressed mood. The patient is nervous/anxious.     PMFS History:  Patient Active Problem List   Diagnosis Date Noted  . 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 Bilateral sacroiliitis ([place]) 05/22/2016  . Psoriatic arthritis ([place]) 05/22/2016  . High risk medications (not anticoagulants) long-term use 05/22/2016  . DJD (degenerative joint disease), cervical 05/22/2016  . Osteoarthritis of spine with radiculopathy, lumbar region 05/22/2016  . Cephalalgia 03/16/2016  . Chronic daily headache 03/16/2016  . Neck pain 03/16/2016  . Hemorrhoids 03/13/2016  . 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 Insomnia 12/26/2015  . Abdominal pain, epigastric 11/13/2015  . Dark stools 11/13/2015  . Rectal bleeding 11/13/2015  . Numbness 09/26/2015  . 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 Avascular necrosis  of left femoral head ([place]) 07/30/2015  . Avascular necrosis of right femoral head ([place]) 07/30/2015  . Psoriasis 07/30/2015  . Chronic obstructive pulmonary disease (COPD) ([place]) 07/30/2015  . 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 Arthritis     also has avascular necrosis   . Chronic back pain   . Chronic neck pain   . Chronic shoulder pain   . COPD (chronic obstructive pulmonary disease) ([place])   . GERD (gastroesophageal reflux disease)    tums  OTC  medication  . Headache   . Hypothyroidism   . Sleep apnea    tested more than 5 yrs ago...negative results    Family History  Problem Relation Age of Onset  . Heart failure Mother   . Diabetes Mother   . Hypertension Mother   . Heart failure Father   . Prostate cancer Father   . Colon cancer Neg Hx    Past Surgical History:  Procedure Laterality Date  . COLONOSCOPY WITH PROPOFOL N/A 12/09/2015   Procedure: COLONOSCOPY WITH PROPOFOL;  Surgeon: Daneil Dolin, MD;  [facility];  Service: Endoscopy;  Laterality: N/A;  1100  . ESOPHAGOGASTRODUODENOSCOPY (EGD) WITH PROPOFOL N/A 12/09/2015   Procedure: ESOPHAGOGASTRODUODENOSCOPY (EGD) WITH PROPOFOL;  Surgeon: Daneil Dolin, MD;  [facility];  Service: Endoscopy;  Laterality: N/A;  . JOINT REPLACEMENT     left hip 2017  . NECK SURGERY    . SHOULDER SURGERY    . TOTAL HIP ARTHROPLASTY Left 07/30/2015   Procedure: TOTAL HIP ARTHROPLASTY;  Surgeon: Garald Balding, MD;  Location: [facility];  Service: Orthopedics;  Laterality: Left;  . TOTAL HIP ARTHROPLASTY Right 09/22/2016  . TOTAL HIP ARTHROPLASTY Right 09/22/2016   Procedure: RIGHT TOTAL HIP ARTHROPLASTY;  Surgeon: Garald Balding, MD;  Location: [facility];  Service: Orthopedics;  Laterality: Right;   Social History   Social History Narrative  . Not on file     Objective: Vital Signs: BP 131/81 (BP Location: Left Arm, Patient Position: Sitting, Cuff Size: Normal)   Pulse 95   Resp 18   Ht 5\' 11"  (1.803 m)   Wt 205 lb (93 kg)   BMI 28.59 kg/m    Physical Exam  Constitutional: He is oriented to person, place, and time. He appears well-developed and well-nourished.  HENT:  Head: Normocephalic and atraumatic.  Eyes: Conjunctivae and EOM are  normal. Pupils are equal, round, and reactive to light.  Neck: Normal range of motion. Neck supple.  Cardiovascular: Normal rate, regular rhythm and normal heart sounds.  Pulmonary/Chest: Effort normal and breath sounds normal.  Abdominal: Soft. Bowel sounds are normal.  Neurological: He is alert and oriented to person, place, and time.  Skin: Skin is warm and dry. Capillary refill takes less than 2 seconds.  Psychiatric: He has a normal mood and affect. His behavior is normal.  Nursing note and vitals reviewed.    Musculoskeletal Exam: C-spine good ROM.  Limited ROM of lumbar spine.  Tenderness of bilateral SI joints.  No midline spinal tenderness.  Shoulder joints, elbow joints, MCPs, PIPs, and DIPs good ROM with no synovitis.  Complete fist formation.    CDAI Exam: No CDAI exam completed.    Investigation: No additional findings.TB Gold: 02/04/2016 Negative  CBC Latest Ref Rng & Units 09/24/2016 09/23/2016 09/10/2016  WBC 4.0 - 10.5 K/uL 10.0 8.1 6.4  Hemoglobin 13.0 - 17.0 g/dL 11.7(L)  12.5(L) 16.3  Hematocrit 39.0 - 52.0 % 35.0(L) 37.5(L) 46.9  Platelets 150 - 400 K/uL 128(L) 131(L) 143(L)   CMP Latest Ref Rng & Units 09/24/2016 09/23/2016 09/10/2016  Glucose 65 - 99 mg/dL 128(H) 100(H) 98  BUN 6 - 20 mg/dL 9 15 13   Creatinine 0.61 - 1.24 mg/dL 0.86 1.05 1.00  Sodium 135 - 145 mmol/L 135 136 139  Potassium 3.5 - 5.1 mmol/L 3.7 4.0 4.0  Chloride 101 - 111 mmol/L 105 106 105  CO2 22 - 32 mmol/L 24 25 23   Calcium 8.9 - 10.3 mg/dL 8.5(L) 8.4(L) 9.5  Total Protein 6.5 - 8.1 g/dL - - 7.0  Total Bilirubin 0.3 - 1.2 mg/dL - - 0.9  Alkaline Phos 38 - 126 U/L - - 44  AST 15 - 41 U/L - - 25  ALT 17 - 63 U/L - - 21    Imaging: No results found.  Speciality Comments: No specialty comments available.    Procedures:  Sacroiliac Joint Inj on 01/18/2017 2:17 PM Indications: pain Details: 27 G 1.5 in needle, posterior approach Medications: 1 mL lidocaine 1 %; 40 mg triamcinolone  acetonide 40 MG/ML Aspirate: 0 mL Outcome: tolerated well, no immediate complications Procedure, treatment alternatives, risks and benefits explained, specific risks discussed. Consent was given by the patient. Immediately prior to procedure a time out was called to verify the correct patient, procedure, equipment, support staff and site/side marked as required. Patient was prepped and draped in the usual sterile fashion.     Allergies: Patient has no known allergies.    Assessment / Plan:     Visit Diagnoses: Bilateral sacroiliitis ([place]) - He has tenderness of bilateral SI joints.  His left SI joint pain is more severe.  A cortisone of his left SI joint was performed today.  He tolerated the procedure well.    He previously was on Enbrel beginning May 2017.  He was started on Cosentyx loading dose in June 18, 2011.  He had to hold his Cosentyx for a right total hip arthroplasty in September 2018 performed by Dr. Durward Fortes. He has only had 2 doses since his surgery in September 2018.  He will continue on Cosentyx 300 mg every 30 days.  A Prednisone 20 mg taper was sent to the pharmacy.   Discussed potential side effects of Prednisone.  We discussed starting Celebrex following his Prednisone taper.  He will for a prescription when he has completed his taper.  Discussed starting Humira if he fails Cosentyx.  We will follow up with him in 3 months.  He was given information on Humira. Plan: predniSONE (DELTASONE) 5 MG tablet  Chronic left SI joint pain - A left SI joint cortisone injection was performed today.  A prednisone taper was ordered today. Plan: predniSONE (DELTASONE) 5 MG tablet   Psoriatic arthritis ([place]): PIP and DIP synovial thickening.  No synovitis on exam.  He will continue on Cosentyx 300 mg every 30 days.  Psoriasis: No active psoriasis.  No nail pitting.    High risk medications (not anticoagulants) long-term use - cosentyx 300 mg.  TB gold was drawn today with routine CBC and CMP.   - Plan: CBC with Differential/Platelet, COMPLETE METABOLIC PANEL WITH GFR, QuantiFERON-TB Gold Plus  DDD (degenerative disc disease), cervical - s/p fusion: No discomfort at this time.  DDD (degenerative disc disease), lumbar: Chronic pain.  No midline spinal tenderness.   History of total hip replacement, left: Doing well  History of total right  hip replacement: Doing well  Avascular necrosis of right femoral head [facility]): He had a right total hip arthroplasty.    Primary insomnia: He has severe nocturnal pain, which has been keeping him up at night.    Other medical conditions are listed as follows:  Smoker  History of hypothyroidism  History of hyperglycemia  History of gastroesophageal reflux (GERD)  History of COPD  History of depression   Orders: Orders Placed This Encounter  Procedures  . Sacroiliac Joint Inj  . CBC with Differential/Platelet  . COMPLETE METABOLIC PANEL WITH GFR  . QuantiFERON-TB Gold Plus   Meds ordered this encounter  Medications  . predniSONE (DELTASONE) 5 MG tablet    Sig: Take 4 tablets by mouth for 4 days, then take 3 tablets for 4 days, then take 2 tablets for 4 days, and then take 1 tablet for 4 days    Dispense:  40 tablet    Refill:  0    Face-to-face time spent with patient was 30 minutes. Greater than 50% of time was spent in counseling and coordination of care.  Follow-Up Instructions: Return in about 3 months (around 04/18/2017) for Psoriatic arthritis, Sacroilitis .   Bo Merino, MD  Note - This record has been created using Editor, commissioning.  Chart creation errors have been sought, but may not always  have been located. Such creation errors do not reflect on  the standard of medical care.

## 2017-01-18 ENCOUNTER — Encounter: Payer: Self-pay | Admitting: Rheumatology

## 2017-01-18 ENCOUNTER — Ambulatory Visit (INDEPENDENT_AMBULATORY_CARE_PROVIDER_SITE_OTHER): Payer: Managed Care, Other (non HMO) | Admitting: Rheumatology

## 2017-01-18 VITALS — BP 131/81 | HR 95 | Resp 18 | Ht 71.0 in | Wt 205.0 lb

## 2017-01-18 DIAGNOSIS — F17200 Nicotine dependence, unspecified, uncomplicated: Secondary | ICD-10-CM

## 2017-01-18 DIAGNOSIS — M5030 Other cervical disc degeneration, unspecified cervical region: Secondary | ICD-10-CM | POA: Diagnosis not present

## 2017-01-18 DIAGNOSIS — M533 Sacrococcygeal disorders, not elsewhere classified: Secondary | ICD-10-CM

## 2017-01-18 DIAGNOSIS — Z96642 Presence of left artificial hip joint: Secondary | ICD-10-CM

## 2017-01-18 DIAGNOSIS — M5136 Other intervertebral disc degeneration, lumbar region: Secondary | ICD-10-CM

## 2017-01-18 DIAGNOSIS — Z79899 Other long term (current) drug therapy: Secondary | ICD-10-CM | POA: Diagnosis not present

## 2017-01-18 DIAGNOSIS — L409 Psoriasis, unspecified: Secondary | ICD-10-CM | POA: Diagnosis not present

## 2017-01-18 DIAGNOSIS — L4050 Arthropathic psoriasis, unspecified: Secondary | ICD-10-CM

## 2017-01-18 DIAGNOSIS — M87051 Idiopathic aseptic necrosis of right femur: Secondary | ICD-10-CM | POA: Diagnosis not present

## 2017-01-18 DIAGNOSIS — Z8639 Personal history of other endocrine, nutritional and metabolic disease: Secondary | ICD-10-CM

## 2017-01-18 DIAGNOSIS — F5101 Primary insomnia: Secondary | ICD-10-CM | POA: Diagnosis not present

## 2017-01-18 DIAGNOSIS — Z8709 Personal history of other diseases of the respiratory system: Secondary | ICD-10-CM

## 2017-01-18 DIAGNOSIS — Z96641 Presence of right artificial hip joint: Secondary | ICD-10-CM | POA: Diagnosis not present

## 2017-01-18 DIAGNOSIS — M461 Sacroiliitis, not elsewhere classified: Secondary | ICD-10-CM

## 2017-01-18 DIAGNOSIS — Z8659 Personal history of other mental and behavioral disorders: Secondary | ICD-10-CM

## 2017-01-18 DIAGNOSIS — G8929 Other chronic pain: Secondary | ICD-10-CM

## 2017-01-18 DIAGNOSIS — M51369 Other intervertebral disc degeneration, lumbar region without mention of lumbar back pain or lower extremity pain: Secondary | ICD-10-CM

## 2017-01-18 DIAGNOSIS — Z8719 Personal history of other diseases of the digestive system: Secondary | ICD-10-CM

## 2017-01-18 MED ORDER — TRIAMCINOLONE ACETONIDE 40 MG/ML IJ SUSP
40.0000 mg | INTRAMUSCULAR | Status: AC | PRN
  Administered 2017-01-18: 40 mg via INTRA_ARTICULAR

## 2017-01-18 MED ORDER — LIDOCAINE HCL 1 % IJ SOLN
1.0000 mL | INTRAMUSCULAR | Status: AC | PRN
  Administered 2017-01-18: 1 mL

## 2017-01-18 MED ORDER — PREDNISONE 5 MG PO TABS: ORAL_TABLET | ORAL | 0 refills | Status: DC

## 2017-01-18 NOTE — Patient Instructions (Signed)
Adalimumab Injection What is this medicine? ADALIMUMAB (a dal AYE mu mab) is used to treat rheumatoid and psoriatic arthritis. It is also used to treat ankylosing spondylitis, Crohn's disease, ulcerative colitis, plaque psoriasis, hidradenitis suppurativa, and uveitis. This medicine may be used for other purposes; ask your health care provider or pharmacist if you have questions. COMMON BRAND NAME(S): CYLTEZO, Humira What should I tell my health care provider before I take this medicine? They need to know if you have any of these conditions: -diabetes -heart disease -hepatitis B or history of hepatitis B infection -immune system problems -infection or history of infections -multiple sclerosis -recently received or scheduled to receive a vaccine -scheduled to have surgery -tuberculosis, a positive skin test for tuberculosis or have recently been in close contact with someone who has tuberculosis -an unusual reaction to adalimumab, other medicines, mannitol, latex, rubber, foods, dyes, or preservatives -pregnant or trying to get pregnant -breast-feeding How should I use this medicine? This medicine is for injection under the skin. You will be taught how to prepare and give this medicine. Use exactly as directed. Take your medicine at regular intervals. Do not take your medicine more often than directed. A special MedGuide will be given to you by the pharmacist with each prescription and refill. Be sure to read this information carefully each time. It is important that you put your used needles and syringes in a special sharps container. Do not put them in a trash can. If you do not have a sharps container, call your pharmacist or healthcare provider to get one. Talk to your pediatrician regarding the use of this medicine in children. While this drug may be prescribed for children as young as 2 years for selected conditions, precautions do apply. The manufacturer of the medicine offers free  information to patients and their health care partners. Call 1-800-448-6472 for more information. Overdosage: If you think you have taken too much of this medicine contact a poison control center or emergency room at once. NOTE: This medicine is only for you. Do not share this medicine with others. What if I miss a dose? If you miss a dose, take it as soon as you can. If it is almost time for your next dose, take only that dose. Do not take double or extra doses. Give the next dose when your next scheduled dose is due. Call your doctor or health care professional if you are not sure how to handle a missed dose. What may interact with this medicine? Do not take this medicine with any of the following medications: -abatacept -anakinra -etanercept -infliximab -live virus vaccines -rilonacept This medicine may also interact with the following medications: -vaccines This list may not describe all possible interactions. Give your health care provider a list of all the medicines, herbs, non-prescription drugs, or dietary supplements you use. Also tell them if you smoke, drink alcohol, or use illegal drugs. Some items may interact with your medicine. What should I watch for while using this medicine? Visit your doctor or health care professional for regular checks on your progress. Tell your doctor or healthcare professional if your symptoms do not start to get better or if they get worse. You will be tested for tuberculosis (TB) before you start this medicine. If your doctor prescribes any medicine for TB, you should start taking the TB medicine before starting this medicine. Make sure to finish the full course of TB medicine. Call your doctor or health care professional if you get a cold   or other infection while receiving this medicine. Do not treat yourself. This medicine may decrease your body's ability to fight infection. Talk to your doctor about your risk of cancer. You may be more at risk for  certain types of cancers if you take this medicine. What side effects may I notice from receiving this medicine? Side effects that you should report to your doctor or health care professional as soon as possible: -allergic reactions like skin rash, itching or hives, swelling of the face, lips, or tongue -breathing problems -changes in vision -chest pain -fever, chills, or any other sign of infection -numbness or tingling -red, scaly patches or raised bumps on the skin -swelling of the ankles -swollen lymph nodes in the neck, underarm, or groin areas -unexplained weight loss -unusual bleeding or bruising -unusually weak or tired Side effects that usually do not require medical attention (report to your doctor or health care professional if they continue or are bothersome): -headache -nausea -redness, itching, swelling, or bruising at site where injected This list may not describe all possible side effects. Call your doctor for medical advice about side effects. You may report side effects to FDA at 1-800-FDA-1088. Where should I keep my medicine? Keep out of the reach of children. Store in the original container and in the refrigerator between 2 and 8 degrees C (36 and 46 degrees F). Do not freeze. The product may be stored in a cool carrier with an ice pack, if needed. Protect from light. Throw away any unused medicine after the expiration date. NOTE: This sheet is a summary. It may not cover all possible information. If you have questions about this medicine, talk to your doctor, pharmacist, or health care provider.  2018 Elsevier/Gold Standard (2014-07-11 11:11:43)  

## 2017-01-20 LAB — CBC WITH DIFFERENTIAL/PLATELET
BASOS ABS: 42 {cells}/uL (ref 0–200)
Basophils Relative: 0.6 %
Eosinophils Absolute: 119 cells/uL (ref 15–500)
Eosinophils Relative: 1.7 %
HCT: 42.6 % (ref 38.5–50.0)
Hemoglobin: 14.7 g/dL (ref 13.2–17.1)
Lymphs Abs: 1701 cells/uL (ref 850–3900)
MCH: 31.1 pg (ref 27.0–33.0)
MCHC: 34.5 g/dL (ref 32.0–36.0)
MCV: 90.3 fL (ref 80.0–100.0)
MPV: 12.5 fL (ref 7.5–12.5)
Monocytes Relative: 7.9 %
NEUTROS PCT: 65.5 %
Neutro Abs: 4585 cells/uL (ref 1500–7800)
PLATELETS: 167 10*3/uL (ref 140–400)
RBC: 4.72 10*6/uL (ref 4.20–5.80)
RDW: 13.3 % (ref 11.0–15.0)
TOTAL LYMPHOCYTE: 24.3 %
WBC: 7 10*3/uL (ref 3.8–10.8)
WBCMIX: 553 {cells}/uL (ref 200–950)

## 2017-01-20 LAB — QUANTIFERON-TB GOLD PLUS
Mitogen-NIL: 6.28 IU/mL
NIL: 0.03 [IU]/mL
QuantiFERON-TB Gold Plus: NEGATIVE
TB1-NIL: 0.02 IU/mL
TB2-NIL: 0.02 [IU]/mL

## 2017-01-20 LAB — COMPLETE METABOLIC PANEL WITH GFR
AG RATIO: 1.9 (calc) (ref 1.0–2.5)
ALKALINE PHOSPHATASE (APISO): 60 U/L (ref 40–115)
ALT: 11 U/L (ref 9–46)
AST: 15 U/L (ref 10–40)
Albumin: 4.6 g/dL (ref 3.6–5.1)
BILIRUBIN TOTAL: 0.5 mg/dL (ref 0.2–1.2)
BUN: 15 mg/dL (ref 7–25)
CALCIUM: 9.5 mg/dL (ref 8.6–10.3)
CHLORIDE: 103 mmol/L (ref 98–110)
CO2: 29 mmol/L (ref 20–32)
Creat: 0.96 mg/dL (ref 0.60–1.35)
GFR, Est African American: 111 mL/min/{1.73_m2} (ref >=60)
GFR, Est Non African American: 96 mL/min/{1.73_m2} (ref >=60)
GLOBULIN: 2.4 g/dL (ref 1.9–3.7)
Glucose, Bld: 95 mg/dL (ref 65–99)
POTASSIUM: 4.4 mmol/L (ref 3.5–5.3)
SODIUM: 139 mmol/L (ref 135–146)
Total Protein: 7 g/dL (ref 6.1–8.1)

## 2017-01-20 NOTE — Progress Notes (Signed)
All labs are WNL

## 2017-01-27 ENCOUNTER — Encounter: Payer: Self-pay | Admitting: *Deleted

## 2017-01-27 NOTE — Telephone Encounter (Signed)
Referral letter written

## 2017-01-29 ENCOUNTER — Telehealth: Payer: Self-pay

## 2017-01-29 ENCOUNTER — Telehealth: Payer: Self-pay | Admitting: Rheumatology

## 2017-01-29 NOTE — Telephone Encounter (Signed)
OPENED IN ERROR

## 2017-01-29 NOTE — Telephone Encounter (Signed)
Received a call from [facility]. Spoke with Elmyra Ricks who is check the status of pts prior auth. The Josem Kaufmann was denied and an appeal letter was faxed in as an expedited request. She was unable to locate the letter. She will keep an eye out for it today and then again on Monday and follow up with [place]. An expedited review can take up to one week before we hear a response. Will fax letter again.   Called pt to update and see if he needs a sample. Left voicemail.  Farrin Shadle, [place], CPhT 11:15 AM

## 2017-02-01 ENCOUNTER — Telehealth: Payer: Self-pay | Admitting: Rheumatology

## 2017-02-01 NOTE — Telephone Encounter (Signed)
Called [employer] to verify appeal approval for cosentyx. Spoke with Santiago Glad who states that the appeal is still open/in progress. Nothing has been loaded into the system as of yet. Standard appeals will be processed within 30 days. Appeal was accepted on 01/29/17.   Reference number: [id number] Phone: [phone]  Called to update. He states that he has not had a dose of Cosentyx since Oct/Nov 2018. He came by the clinic today to get a sample (300mg ). He plans to inject tonight. We will update once we receive a response from insurance. Patient voices understanding and denies any questions at this time.   Anesha Hackert, [place], CPhT 4:29 PM

## 2017-02-01 NOTE — Telephone Encounter (Signed)
Elmyra Ricks from [employer] called stating that the appeal was approved for patient's Cosentyx.  They are in need of a prescription for the 300 mg.  The approval is for 02/01/17 to 03/04/17 they are unsure why it is only for one month.  If you have any questions please call [phone]

## 2017-02-04 ENCOUNTER — Telehealth: Payer: Self-pay | Admitting: Rheumatology

## 2017-02-04 NOTE — Telephone Encounter (Signed)
Patient called stating that he was told to contact the office when his Prednisone ran out so he could be prescribed another medication (he was unsure of the name).

## 2017-02-05 MED ORDER — CELECOXIB 200 MG PO CAPS: 200.0000 mg | ORAL_CAPSULE | Freq: Two times a day (BID) | ORAL | 2 refills | Status: DC

## 2017-02-05 NOTE — Telephone Encounter (Signed)
Patient advised prescription for Celebrex sent to pharmacy.

## 2017-02-12 ENCOUNTER — Telehealth: Payer: Self-pay | Admitting: Rheumatology

## 2017-02-12 NOTE — Telephone Encounter (Signed)
Merrilee Seashore from [employer] called to let you know that the appeal for Charles [name] was only approved for the starting dose of Cosentyx.  Another appeal will need to be sent for the continuation dose.  If you have any questions, please call  [phone].  He also stated that the Cosentyx must be filed through [employer]

## 2017-02-15 NOTE — Telephone Encounter (Signed)
Please send appeal for continuation dose of Cosentyx. Phone [phone]

## 2017-02-17 NOTE — Telephone Encounter (Signed)
Received a letter from [place] stating that Cosentyx has been approved for the loading dose from 02/01/2017 through 03/04/2017.   Called [employer] to get clarification. Pt will need authorization for maintenance dose. Spoke with Juanda Crumble who state that because the pt has already started the medication, a new PA for the maintenance dose will be required. He was able to submit the auth over the phone. We should have a response within 5 business days. His Rx must be filled at [facility].   Reference number: [id number] Phone: [phone]  Called pt to update. He states that he lost his mother last night and is unsure what day he took his last dose. We provided him with a sample on 02/01/2017. If he needs another sample before we hear back, we can provide another one. Will update once we receive a response. Pt voices understanding and denies any questions at this time.   We will not need to submit an appeal for the maintenance. We will send a new Rx to [place] once we hear a response.   Rosetta Rupnow, [place], CPhT 9:43 AM

## 2017-02-22 ENCOUNTER — Encounter: Payer: Self-pay | Admitting: *Deleted

## 2017-02-22 ENCOUNTER — Telehealth: Payer: Self-pay

## 2017-02-22 NOTE — Telephone Encounter (Signed)
Letter written

## 2017-02-22 NOTE — Telephone Encounter (Signed)
Received a fax from [employer] regarding a prior authorization [place] for [place] because there must be documentation of a inadequate response, contraindication, intolerance or not a candidate for at least one DMARD and TWO preferred products (Enbrel, Humira, Remicade, Stelara).  Patient has tried and failed Enbrel in the past. He has been on Cosentyx since 06/2016. Please send an expedited appeal (within 72 hour turnaround) to:   Fax: [phone]. Write "EXPEDITED" on fax with letter of medically necessary and office visit notes. Thanks!   Reference number: [id number] Phone number:[phone]  Will send document to scan center.  Audryanna Zurita, [place], CPhT  9:18 AM

## 2017-02-24 ENCOUNTER — Telehealth: Payer: Self-pay | Admitting: Rheumatology

## 2017-02-24 NOTE — Telephone Encounter (Signed)
Patient advised we can provide him with a sample of Cosentyx. Patient will come by the office to pick it up.

## 2017-02-24 NOTE — Telephone Encounter (Signed)
Patient calling requesting a sample of Cosentyx. Please call to advise.

## 2017-03-02 NOTE — Telephone Encounter (Signed)
Received a letter from [place] stating that they received the expedited appeal request for Cosentyx. Based on the criteria, the request doesn't meet for an expedited appeal. The appeal will be reviewed within the usual timeframe. Once all information has been reviewed, we will receive a letter with a decision by March 20th.   Will send document to scan center.   Malori Myers, [place], CPhT 8:26 AM

## 2017-03-19 ENCOUNTER — Ambulatory Visit (INDEPENDENT_AMBULATORY_CARE_PROVIDER_SITE_OTHER): Payer: 59 | Admitting: Orthopaedic Surgery

## 2017-03-19 ENCOUNTER — Ambulatory Visit (INDEPENDENT_AMBULATORY_CARE_PROVIDER_SITE_OTHER): Payer: Managed Care, Other (non HMO)

## 2017-03-19 ENCOUNTER — Encounter (INDEPENDENT_AMBULATORY_CARE_PROVIDER_SITE_OTHER): Payer: Self-pay | Admitting: Orthopaedic Surgery

## 2017-03-19 VITALS — BP 140/91 | HR 77 | Resp 18 | Ht 72.0 in | Wt 195.0 lb

## 2017-03-19 DIAGNOSIS — M5441 Lumbago with sciatica, right side: Secondary | ICD-10-CM

## 2017-03-19 DIAGNOSIS — Z96641 Presence of right artificial hip joint: Secondary | ICD-10-CM

## 2017-03-19 DIAGNOSIS — G8929 Other chronic pain: Secondary | ICD-10-CM | POA: Diagnosis not present

## 2017-03-19 NOTE — Progress Notes (Signed)
Office Visit Note   Patient: Charles [name]           Date of Birth: 04/30/1970           MRN: [id number] Visit Date: 03/19/2017              Requested by: Charles Squibb, MD [place], [place] [id number] PCP: Charles Squibb, MD   Assessment & Plan: Visit Diagnoses:  1. Status post right hip replacement     Plan: Films of the pelvis and right hip reveal excellent position of the components.  Not sure that the thigh pain that he is experiencing is related to his hip.  He does have chronic back pain and is on oxycodone per his primary care physician.  I think it is worth obtaining an MRI scan .scan in 2015 demonstrated significant degenerative changes at L4-5 and L5-S1.  I believe the majority of his thigh pain might be originating from his back  Follow-Up Instructions: No Follow-up on file.   Orders:  Orders Placed This Encounter  Procedures  . XR HIP UNILAT W OR W/O PELVIS 2-3 VIEWS LEFT   No orders of the defined types were placed in this encounter.     Procedures: No procedures performed   Clinical Data: No additional findings.   Subjective: Chief Complaint  Patient presents with  . Right Hip - Routine Post Op, Follow-up    MR Charles [name] IS 47 YO M HERER FOR POST OP . SURGERY WAS SEPT. 2018. PAIN IN HIP  NOT AS BAD BUT HAVING TROUBLE AND PAIN WITH  THIGH MUSCLE  6 months status post primary right total hip replacement.  Has been experiencing some "muscle" pain along the anterior lateral right preoperative hip pain or groin discomfort.  He is working up to 60 hours a week predominantly on his feet.  No distal edema.  No neurovascular compromise.  Has a long history of low back pain.  Being treated by his primary care physician with chronic Percocet use at least twice a day.  He had an MRI scan of his lumbar spine in 2015 demonstrating degenerative changes at L4-5 and L5-S1 HPI  Review of Systems  Constitutional: Positive for fatigue. Negative for fever.  HENT:  Positive for tinnitus. Negative for ear pain.   Eyes: Negative for pain.  Respiratory: Positive for cough. Negative for shortness of breath.   Cardiovascular: Negative for leg swelling.  Gastrointestinal: Negative for blood in stool, constipation and diarrhea.  Genitourinary: Negative for dysuria.  Musculoskeletal: Positive for back pain and neck pain.  Skin: Negative for rash and wound.  Allergic/Immunologic: Negative for food allergies.  Neurological: Positive for weakness. Negative for dizziness, light-headedness, numbness and headaches.  Psychiatric/Behavioral: Positive for sleep disturbance.     Objective: Vital Signs: BP (!) 140/91 (BP Location: Left Arm, Patient Position: Sitting, Cuff Size: Normal)   Pulse 77   Resp 18   Ht 6' (1.829 m)   Wt 195 lb (88.5 kg)   BMI 26.45 kg/m   Physical Exam  Ortho Exam awake alert and oriented x3.  Comfortable sitting.  Walks without a limp.  Pelvis is level.  Leg lengths are symmetrical.  Neurovascular exam intact.  No pain with range of motion of right or left hip flexion extension internal or external rotation.  No right thigh pain.  Specialty Comments:  No specialty comments available.  Imaging: No results found.   PMFS History: Patient Active Problem List  Diagnosis Date Noted  . 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 Bilateral sacroiliitis ([place]) 05/22/2016  . Psoriatic arthritis ([place]) 05/22/2016  . High risk medications (not anticoagulants) long-term use 05/22/2016  . DJD (degenerative joint disease), cervical 05/22/2016  . Osteoarthritis of spine with radiculopathy, lumbar region 05/22/2016  . Cephalalgia 03/16/2016  . Chronic daily headache 03/16/2016  . Neck pain 03/16/2016  . Hemorrhoids 03/13/2016  . 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 Insomnia 12/26/2015  . Abdominal pain, epigastric 11/13/2015  . Dark stools 11/13/2015  . Rectal bleeding 11/13/2015  . Numbness  09/26/2015  . 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 Avascular necrosis of left femoral head ([place]) 07/30/2015  . Avascular necrosis of right femoral head ([place]) 07/30/2015  . Psoriasis 07/30/2015  . Chronic obstructive pulmonary disease (COPD) ([place]) 07/30/2015  . 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   Past Medical History:  Diagnosis Date  . Arthritis    also has avascular necrosis   . Chronic back pain   . Chronic neck pain   . Chronic shoulder pain   . COPD (chronic obstructive pulmonary disease) ([place])   . GERD (gastroesophageal reflux disease)    tums  OTC  medication  . Headache   . Hypothyroidism   . Sleep apnea    tested more than 5 yrs ago...negative results    Family History  Problem Relation Age of Onset  . Heart failure Mother   . Diabetes Mother   . Hypertension Mother   . Heart failure Father   . Prostate cancer Father   . Colon cancer Neg Hx     Past Surgical History:  Procedure Laterality Date  . COLONOSCOPY WITH PROPOFOL N/A 12/09/2015   Procedure: COLONOSCOPY WITH PROPOFOL;  Surgeon: Daneil Dolin, MD;  [facility];  Service: Endoscopy;  Laterality: N/A;  1100  . ESOPHAGOGASTRODUODENOSCOPY (EGD) WITH PROPOFOL N/A 12/09/2015   Procedure: ESOPHAGOGASTRODUODENOSCOPY (EGD) WITH PROPOFOL;  Surgeon: Daneil Dolin, MD;  [facility];  Service: Endoscopy;  Laterality: N/A;  . JOINT REPLACEMENT     left hip 2017  . NECK SURGERY    . SHOULDER SURGERY    . TOTAL HIP ARTHROPLASTY Left 07/30/2015   Procedure: TOTAL HIP ARTHROPLASTY;  Surgeon: Garald Balding, MD;  Location: [facility];  Service: Orthopedics;  Laterality: Left;  . TOTAL HIP ARTHROPLASTY Right 09/22/2016  . TOTAL HIP ARTHROPLASTY Right 09/22/2016   Procedure: RIGHT TOTAL HIP ARTHROPLASTY;  Surgeon: Garald Balding, MD;  Location: [facility];  Service: Orthopedics;  Laterality: Right;   Social History   Occupational History  . Not  on file  Tobacco Use  . Smoking status: Former Smoker    Packs/day: 0.50    Years: 25.00    Pack years: 12.50    Types: Cigarettes, E-cigarettes    Last attempt to quit: 11/03/2016    Years since quitting: 0.3  . Smokeless tobacco: Never Used  Substance and Sexual Activity  . Alcohol use: Yes  . Drug use: No    Comment: states quit  . Sexual activity: Not on file

## 2017-03-31 ENCOUNTER — Ambulatory Visit (HOSPITAL_COMMUNITY)
Admission: RE | Admit: 2017-03-31 | Discharge: 2017-03-31 | Disposition: A | Discharge status: Home / Self Care | Payer: Managed Care, Other (non HMO) | Source: Ambulatory Visit | Attending: Orthopaedic Surgery | Admitting: Orthopaedic Surgery

## 2017-03-31 DIAGNOSIS — M48061 Spinal stenosis, lumbar region without neurogenic claudication: Secondary | ICD-10-CM | POA: Insufficient documentation

## 2017-03-31 DIAGNOSIS — G8929 Other chronic pain: Secondary | ICD-10-CM | POA: Diagnosis not present

## 2017-03-31 DIAGNOSIS — M5441 Lumbago with sciatica, right side: Secondary | ICD-10-CM | POA: Diagnosis present

## 2017-04-05 ENCOUNTER — Encounter (INDEPENDENT_AMBULATORY_CARE_PROVIDER_SITE_OTHER): Payer: Self-pay | Admitting: Orthopaedic Surgery

## 2017-04-05 ENCOUNTER — Ambulatory Visit (INDEPENDENT_AMBULATORY_CARE_PROVIDER_SITE_OTHER): Payer: Managed Care, Other (non HMO) | Admitting: Orthopaedic Surgery

## 2017-04-05 VITALS — BP 146/89 | HR 93 | Resp 16 | Ht 72.0 in | Wt 195.0 lb

## 2017-04-05 DIAGNOSIS — G8929 Other chronic pain: Secondary | ICD-10-CM

## 2017-04-05 DIAGNOSIS — M545 Low back pain, unspecified: Secondary | ICD-10-CM | POA: Insufficient documentation

## 2017-04-05 DIAGNOSIS — M5441 Lumbago with sciatica, right side: Secondary | ICD-10-CM | POA: Diagnosis not present

## 2017-04-05 NOTE — Progress Notes (Signed)
Office Visit Note   Patient: Charles [name]           Date of Birth: 1970/02/24           MRN: [id number] Visit Date: 04/05/2017              Requested by: Celene Squibb, MD [place], [place] [address] PCP: Celene Squibb, MD   Assessment & Plan: Visit Diagnoses:  1. Chronic right-sided low back pain with right-sided sciatica     Plan: MRI scan reviewed with Sim[facility].  He has some degenerative changes at L2-3, L3-4, L4-5, and L5-S1.  There are also areas of foraminal stenosis at L4-5 and L5-S1 predominantly on the right side.  He is experiencing right-sided pain beginning in his back.  I think it is worth obtaining an epidural steroid injection and monitoring his response.  Will reevaluate in 1 month.  Long discussion regarding MRI scan results.  Given a copy of the report  Follow-Up Instructions: Return in about 1 month (around 05/05/2017).   Orders:  No orders of the defined types were placed in this encounter.  No orders of the defined types were placed in this encounter.     Procedures: No procedures performed   Clinical Data: No additional findings.   Subjective: Chief Complaint  Patient presents with  . Follow-up    MRI REVIEW  Chronic history of low back pain associated with referred discomfort to the buttock and right thigh as far distally as the knee.  Has had hip replacement but that does not appear to be an issue.  Here for review of the MRI scan results  HPI  Review of Systems  Constitutional: Negative for fatigue and fever.  HENT: Negative for ear pain.   Eyes: Negative for pain.  Respiratory: Negative for cough and shortness of breath.   Cardiovascular: Positive for leg swelling.  Gastrointestinal: Negative for blood in stool and constipation.  Genitourinary: Negative for difficulty urinating.  Musculoskeletal: Positive for back pain and neck pain.  Skin: Negative for rash and wound.  Allergic/Immunologic: Negative for food allergies.    Neurological: Positive for numbness. Negative for dizziness, weakness, light-headedness and headaches.  Hematological: Does not bruise/bleed easily.  Psychiatric/Behavioral: Positive for sleep disturbance.     Objective: Vital Signs: BP (!) 146/89 (BP Location: Right Arm, Patient Position: Sitting, Cuff Size: Normal)   Pulse 93   Resp 16   Ht 6' (1.829 m)   Wt 195 lb (88.5 kg)   BMI 26.45 kg/m   Physical Exam  Ortho Exam awake alert and oriented x3.  Comfortable sitting.  Straight leg raise was positive on the right for low back pain at about 90 degrees negative on the left.  Painless range of motion of his right hip.  No percussible tenderness or masses.  Neurovascular exam intact  Specialty Comments:  No specialty comments available.  Imaging: No results found.   PMFS History: Patient Active Problem List   Diagnosis Date Noted  . Chronic right-sided low back pain with right-sided sciatica 04/05/2017  . 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 Bilateral sacroiliitis ([place]) 05/22/2016  . Psoriatic arthritis ([place]) 05/22/2016  . High risk medications (not anticoagulants) long-term use 05/22/2016  . DJD (degenerative joint disease), cervical 05/22/2016  . Osteoarthritis of spine with radiculopathy, lumbar region 05/22/2016  . Cephalalgia 03/16/2016  . Chronic daily headache 03/16/2016  . Neck pain 03/16/2016  . Hemorrhoids 03/13/2016  .  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 Insomnia 12/26/2015  . Abdominal pain, epigastric 11/13/2015  . Dark stools 11/13/2015  . Rectal bleeding 11/13/2015  . Numbness 09/26/2015  . 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 Avascular necrosis of left femoral head ([place]) 07/30/2015  . Avascular necrosis of right femoral head ([place]) 07/30/2015  . Psoriasis 07/30/2015  . Chronic obstructive pulmonary disease (COPD) ([place]) 07/30/2015  . Status post total  replacement of left hip 07/30/2015   Past Medical History:  Diagnosis Date  . Arthritis    also has avascular necrosis   . Chronic back pain   . Chronic neck pain   . Chronic shoulder pain   . COPD (chronic obstructive pulmonary disease) ([place])   . GERD (gastroesophageal reflux disease)    tums  OTC  medication  . Headache   . Hypothyroidism   . Sleep apnea    tested more than 5 yrs ago...negative results    Family History  Problem Relation Age of Onset  . Heart failure Mother   . Diabetes Mother   . Hypertension Mother   . Heart failure Father   . Prostate cancer Father   . Colon cancer Neg Hx     Past Surgical History:  Procedure Laterality Date  . COLONOSCOPY WITH PROPOFOL N/A 12/09/2015   Procedure: COLONOSCOPY WITH PROPOFOL;  Surgeon: Daneil Dolin, MD;  [facility];  Service: Endoscopy;  Laterality: N/A;  1100  . ESOPHAGOGASTRODUODENOSCOPY (EGD) WITH PROPOFOL N/A 12/09/2015   Procedure: ESOPHAGOGASTRODUODENOSCOPY (EGD) WITH PROPOFOL;  Surgeon: Daneil Dolin, MD;  [facility];  Service: Endoscopy;  Laterality: N/A;  . JOINT REPLACEMENT     left hip 2017  . NECK SURGERY    . SHOULDER SURGERY    . TOTAL HIP ARTHROPLASTY Left 07/30/2015   Procedure: TOTAL HIP ARTHROPLASTY;  Surgeon: Garald Balding, MD;  Location: [facility];  Service: Orthopedics;  Laterality: Left;  . TOTAL HIP ARTHROPLASTY Right 09/22/2016  . TOTAL HIP ARTHROPLASTY Right 09/22/2016   Procedure: RIGHT TOTAL HIP ARTHROPLASTY;  Surgeon: Garald Balding, MD;  Location: [facility];  Service: Orthopedics;  Laterality: Right;   Social History   Occupational History  . Not on file  Tobacco Use  . Smoking status: Former Smoker    Packs/day: 0.50    Years: 25.00    Pack years: 12.50    Types: Cigarettes, E-cigarettes    Last attempt to quit: 11/03/2016    Years since quitting: 0.4  . Smokeless tobacco: Never Used  Substance and Sexual Activity  . Alcohol use: Yes  . Drug use: No     Comment: states quit  . Sexual activity: Not on file

## 2017-04-06 ENCOUNTER — Telehealth: Payer: Self-pay

## 2017-04-06 MED ORDER — SECUKINUMAB 150 MG/ML ~~LOC~~ SOAJ: 300.0000 mg | SUBCUTANEOUS | 0 refills | Status: DC

## 2017-04-06 NOTE — Telephone Encounter (Signed)
Received a letter from [place] stating that they have approved the appeal for Cosentyx 300mg  SQ every four weeks for one year from 03/02/2017 through 03/02/2018.   Reference number: [id number]  Will send document to scan center.   Called pt to update. We will send his Rx for Cosentyx to [facility]. Tried to enroll him into a cosentyx copay card but the site says that he is already enrolled. He will reach out to them and get his information to give to the pharmacy. He will be due for his next injection in about 3 weeks. Please send in new Rx for Cosentyx to [facility]. Thanks!  Samara Stankowski, [place], CPhT 10:20 AM

## 2017-04-06 NOTE — Telephone Encounter (Signed)
Last Visit: 01/18/17 Next Visit: 04/20/17 Labs: 01/18/17 WNL TB Gold: 01/18/17 WNL  Okay to refill per Dr. Estanislado Pandy

## 2017-04-06 NOTE — Progress Notes (Signed)
Office Visit Note  Patient: Charles [name]             Date of Birth: 24-Nov-1970           MRN: [id number]             PCP: Celene Squibb, MD Referring: Celene Squibb, MD Visit Date: 04/20/2017 Occupation: @GUAROCC @    Subjective:  Lower back pain    History of Present Illness: Charles [name] is a 47 y.o. male with history of psoriatic arthritis and bilateral sacroiliitis.  Patient states he has noticed some benefit since starting on Cosentyx at the end of January.  He denies missing any doses.  He denies any pain in his bilateral hands and bilateral feet.  He denies any joint swelling.  He denies any Achilles tendinitis or plantar fasciitis.  He states he continues to have bilateral SI joint pain, worse in his right.  He states that he saw Dr. Durward Fortes and was referred for an epidural injection which was performed last Thursday.  He states that he has noticed some improvement in the pain that radiates down his right leg.  He states he continues to have intermittent pain in bilateral thighs.  He denies any active psoriasis at this point time.  He has not had any flares of his psoriasis since starting Cosentyx.    Activities of Daily Living:  Patient reports morning stiffness for 45  minutes.   Patient Reports nocturnal pain.  Difficulty dressing/grooming: Denies Difficulty climbing stairs: Reports Difficulty getting out of chair: Denies Difficulty using hands for taps, buttons, cutlery, and/or writing: Denies   Review of Systems  Constitutional: Positive for fatigue. Negative for night sweats.  HENT: Negative for mouth sores, mouth dryness and nose dryness.   Eyes: Negative for pain, redness, visual disturbance and dryness.  Respiratory: Negative for cough, hemoptysis, shortness of breath and difficulty breathing.   Cardiovascular: Negative for chest pain, palpitations, hypertension, irregular heartbeat and swelling in legs/feet.  Gastrointestinal: Negative for blood in stool,  constipation and diarrhea.  Endocrine: Negative for increased urination.  Genitourinary: Negative for painful urination.  Musculoskeletal: Positive for arthralgias, joint pain and morning stiffness. Negative for joint swelling, myalgias, muscle weakness, muscle tenderness and myalgias.  Skin: Negative for color change, rash, hair loss, nodules/bumps, skin tightness, ulcers and sensitivity to sunlight.  Allergic/Immunologic: Negative for susceptible to infections.  Neurological: Negative for dizziness, fainting, memory loss, night sweats and weakness.  Hematological: Negative for swollen glands.  Psychiatric/Behavioral: Negative for depressed mood and sleep disturbance. The patient is nervous/anxious.     PMFS History:  Patient Active Problem List   Diagnosis Date Noted  . Chronic right-sided low back pain with right-sided sciatica 04/05/2017  . 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 Bilateral sacroiliitis ([place]) 05/22/2016  . Psoriatic arthritis ([place]) 05/22/2016  . High risk medications (not anticoagulants) long-term use 05/22/2016  . DJD (degenerative joint disease), cervical 05/22/2016  . Osteoarthritis of spine with radiculopathy, lumbar region 05/22/2016  . Cephalalgia 03/16/2016  . Chronic daily headache 03/16/2016  . Neck pain 03/16/2016  . Hemorrhoids 03/13/2016  . 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 Insomnia 12/26/2015  . Abdominal pain, epigastric 11/13/2015  . Dark stools 11/13/2015  . Rectal bleeding 11/13/2015  . Numbness 09/26/2015  . 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 Avascular necrosis of left femoral head ([place]) 07/30/2015  . Avascular necrosis of right femoral head (  [place]) 07/30/2015  . Psoriasis 07/30/2015  . Chronic obstructive pulmonary disease (COPD) ([place]) 07/30/2015  . 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  Arthritis    also has avascular necrosis   . Chronic back pain   . Chronic neck pain   . Chronic shoulder pain   . COPD (chronic obstructive pulmonary disease) ([place])   . GERD (gastroesophageal reflux disease)    tums  OTC  medication  . Headache   . Hypothyroidism   . Sleep apnea    tested more than 5 yrs ago...negative results    Family History  Problem Relation Age of Onset  . Heart failure Mother   . Diabetes Mother   . Hypertension Mother   . Heart failure Father   . Prostate cancer Father   . Colon cancer Neg Hx    Past Surgical History:  Procedure Laterality Date  . COLONOSCOPY WITH PROPOFOL N/A 12/09/2015   Procedure: COLONOSCOPY WITH PROPOFOL;  Surgeon: Daneil Dolin, MD;  [facility];  Service: Endoscopy;  Laterality: N/A;  1100  . ESOPHAGOGASTRODUODENOSCOPY (EGD) WITH PROPOFOL N/A 12/09/2015   Procedure: ESOPHAGOGASTRODUODENOSCOPY (EGD) WITH PROPOFOL;  Surgeon: Daneil Dolin, MD;  [facility];  Service: Endoscopy;  Laterality: N/A;  . JOINT REPLACEMENT     left hip 2017  . NECK SURGERY    . SHOULDER SURGERY    . TOTAL HIP ARTHROPLASTY Left 07/30/2015   Procedure: TOTAL HIP ARTHROPLASTY;  Surgeon: Garald Balding, MD;  Location: [facility];  Service: Orthopedics;  Laterality: Left;  . TOTAL HIP ARTHROPLASTY Right 09/22/2016  . TOTAL HIP ARTHROPLASTY Right 09/22/2016   Procedure: RIGHT TOTAL HIP ARTHROPLASTY;  Surgeon: Garald Balding, MD;  Location: [facility];  Service: Orthopedics;  Laterality: Right;   Social History   Social History Narrative  . Not on file     Objective: Vital Signs: BP (!) 142/77 (BP Location: Left Arm, Patient Position: Sitting, Cuff Size: Small)   Pulse 90   Resp 14   Ht 6' (1.829 m)   Wt 200 lb (90.7 kg)   BMI 27.12 kg/m    Physical Exam  Constitutional: He is oriented to person, place, and time. He appears well-developed and well-nourished.  HENT:  Head: Normocephalic and atraumatic.  Eyes: Pupils are  equal, round, and reactive to light. Conjunctivae and EOM are normal.  Neck: Normal range of motion. Neck supple.  Cardiovascular: Normal rate, regular rhythm and normal heart sounds.  Pulmonary/Chest: Effort normal and breath sounds normal.  Abdominal: Soft. Bowel sounds are normal.  Lymphadenopathy:    He has no cervical adenopathy.  Neurological: He is alert and oriented to person, place, and time.  Skin: Skin is warm and dry. Capillary refill takes less than 2 seconds.  Psychiatric: He has a normal mood and affect. His behavior is normal.  Nursing note and vitals reviewed.    Musculoskeletal Exam: C-spine good range of motion.  He has limited range of motion of thoracic and lumbar spine.  He has midline spinal tenderness in the lumbar region.  He has bilateral SI joint tenderness.  Shoulder joints, elbow joints, wrist joints, MCPs, PIPs, DIPs good range of motion with no synovitis.  Hip joints, knee joints, ankle joints, MTPs, PIPs, DIPs good range of motion with no synovitis.  No warmth or effusion of bilateral knees.  He has bilateral knee crepitus.  CDAI Exam: CDAI Homunculus  Exam:   Joint Counts:  CDAI Tender Joint count: 0 CDAI Swollen Joint count: 0  Global Assessments:  Patient Global Assessment: 5 Provider Global Assessment: 5  CDAI Calculated Score: 10    Investigation: No additional findings. CBC Latest Ref Rng & Units 01/18/2017 09/24/2016 09/23/2016  WBC 3.8 - 10.8 Thousand/uL 7.0 10.0 8.1  Hemoglobin 13.2 - 17.1 g/dL 14.7 11.7(L) 12.5(L)  Hematocrit 38.5 - 50.0 % 42.6 35.0(L) 37.5(L)  Platelets 140 - 400 Thousand/uL 167 128(L) 131(L)   CMP Latest Ref Rng & Units 01/18/2017 09/24/2016 09/23/2016  Glucose 65 - 99 mg/dL 95 128(H) 100(H)  BUN 7 - 25 mg/dL 15 9 15   Creatinine 0.60 - 1.35 mg/dL 0.96 0.86 1.05  Sodium 135 - 146 mmol/L 139 135 136  Potassium 3.5 - 5.3 mmol/L 4.4 3.7 4.0  Chloride 98 - 110 mmol/L 103 105 106  CO2 20 - 32 mmol/L 29 24 25   Calcium 8.6 -  10.3 mg/dL 9.5 8.5(L) 8.4(L)  Total Protein 6.1 - 8.1 g/dL 7.0 - -  Total Bilirubin 0.2 - 1.2 mg/dL 0.5 - -  Alkaline Phos 38 - 126 U/L - - -  AST 10 - 40 U/L 15 - -  ALT 9 - 46 U/L 11 - -   Imaging: Mr Lumbar Spine W/o Contrast  Result Date: 03/31/2017 CLINICAL DATA:  Initial evaluation for chronic low back pain with right lower extremity radiculopathy. EXAM: MRI LUMBAR SPINE WITHOUT CONTRAST TECHNIQUE: Multiplanar, multisequence MR imaging of the lumbar spine was performed. No intravenous contrast was administered. COMPARISON:  Prior radiograph from 10/23/2014. FINDINGS: Segmentation: Normal segmentation. Lowest well-formed disc labeled the L5-S1 level. Alignment: Straightening with slight reversal of the normal lumbar lordosis. No listhesis. Vertebrae: Vertebral body heights maintained without evidence for acute or chronic fracture. Bone marrow signal intensity within normal limits. No discrete or worrisome osseous lesions. No abnormal marrow edema. Conus medullaris and cauda equina: Conus extends to the L1 level. Conus and cauda equina appear normal. Paraspinal and other soft tissues: Visualized paraspinous soft tissues within normal limits. Visualized visceral structures are unremarkable. Disc levels: Lumbar spine image from the T11-12 level inferiorly through the sacrum. No significant findings are seen through the L1-2 level. L2-3: Diffuse disc bulge with disc desiccation and intervertebral disc space narrowing. No significant spinal stenosis. Mild left L2 foraminal narrowing. No significant right foraminal encroachment. L3-4: Chronic intervertebral disc space narrowing with diffuse disc bulge and disc desiccation. No significant spinal stenosis. Mild bilateral L3 foraminal narrowing L4-5: Mild diffuse disc bulge with disc desiccation. Mild reactive endplate changes. Right extraforaminal/far lateral annular fissure noted, closely approximating the exiting right L4 nerve root (series 6, image 29). No  significant canal stenosis. Moderate bilateral L4 foraminal stenosis, right greater than left. L5-S1: Mild disc bulge with disc desiccation. Mild reactive endplate changes with marginal endplate osteophytic spurring. Mild facet and ligament flavum hypertrophy. No significant canal or lateral recess stenosis. Moderate to advanced bilateral L5 foraminal narrowing. IMPRESSION: 1. Degenerative disc bulge with facet hypertrophy at L5-S1 with resultant moderate to advanced bilateral L5 foraminal stenosis. 2. Disc bulge with associated right far lateral/extraforaminal annular fissure at L4-5, closely approximating and potentially irritating the exiting right L4 nerve root. Electronically Signed   By: Jeannine Boga M.D.   On: 03/31/2017 16:06   Epidural Steroid Injection - Lumbar/sacral (ancillary Performed)  Result Date: 04/15/2017 CLINICAL DATA:  Spondylosis without myelopathy. Low back pain. Some right thigh discomfort. FLUOROSCOPY TIME:  0 minutes 24 seconds. 29.20 micro gray  meter squared PROCEDURE: The procedure, risks, benefits, and alternatives were explained to the patient. Questions regarding the procedure were encouraged and answered. The patient understands and consents to the procedure. LUMBAR EPIDURAL INJECTION: An interlaminar approach was performed on right at L4-5. The overlying skin was cleansed and anesthetized. A 20 gauge epidural needle was advanced using loss-of-resistance technique. DIAGNOSTIC EPIDURAL INJECTION: Injection of Isovue-M 200 shows a good epidural pattern with spread above and below the level of needle placement, primarily on the right. No vascular opacification is seen. THERAPEUTIC EPIDURAL INJECTION: One hundred twenty mg of Depo-Medrol mixed with 2.5 cc 1% lidocaine were instilled. The procedure was well-tolerated, and the patient was discharged thirty minutes following the injection in good condition. COMPLICATIONS: None IMPRESSION: Technically successful epidural injection  on the right at L4-5 # 1. Electronically Signed   By: Nelson Chimes M.D.   On: 04/15/2017 08:38    Speciality Comments: No specialty comments available.    Procedures:  No procedures performed Allergies: Patient has no known allergies.   Assessment / Plan:     Visit Diagnoses: Psoriatic arthritis [facility]): He has no synovitis or dactylitis on exam.  He has been Consentyx since 02/01/2017.Marland Kitchen  He has noticed some benefit since starting.  He has no active psoriasis at this time.  He has no pain in his hands or feet.  He has no joint swelling.  He will continue on Cosentyx subcutaneous injections once a month.  If he continues to have significant pain we will consider adding methotrexate to his current treatment regimen at his next visit.    Psoriasis: He has no active psoriasis at this time.  His psoriasis cleared when he started Cosentyx.   High risk medications (not anticoagulants) long-term use - Cosentyx 300 mg every month TB gold: negative 01/18/17. CBC/CMP: 01/18/17.  CBC and CMP will be drawn today to monitor for drug toxicity.  He will return in July and every 3 months for lab work.- Plan: CBC with Differential/Platelet, COMPLETE METABOLIC PANEL WITH GFR, CBC with Differential/Platelet, COMPLETE METABOLIC PANEL WITH GFR  Bilateral sacroiliitis ([place]): He continues to have chronic pain in bilateral SI joints.  Patient followed up with Dr. Durward Fortes for evaluation of his lower back pain.  He had an MRI of his lumbar spine which revealed degenerative changes at L2-3, L3-L4, L4-L5, and L5-S1 as well as foraminal stenosis at L4-L5 and L5-S1.  He had an epidural steroid injection last Thursday.  He has noticed some improved of his right sided sciatica symptoms.  He is following up with Dr. Durward Fortes on 05/07/2017.  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   Primary insomnia  History of COPD  History of gastroesophageal reflux (GERD)    Orders: Orders Placed This Encounter  Procedures  . CBC with Differential/Platelet  . COMPLETE METABOLIC PANEL WITH GFR  . CBC with Differential/Platelet  . COMPLETE METABOLIC PANEL WITH GFR   No orders of the defined types were placed in this encounter.   Face-to-face time spent with patient was 30 minutes. >50% of time was spent in counseling and coordination of care.  Follow-Up Instructions: Return for Psoriatic arthritis.   Ofilia Neas, PA-C   I examined and evaluated the patient with Hazel Sams PA.  Patient continues to have some pain and discomfort in sacroiliac joints.  At this point he wants to continue with  Cosyntex and will discuss methotrexate at follow-up visit if his symptoms persist.  The plan of care was discussed as noted above.  Bo Merino, MD  Note - This record has been created using Editor, commissioning.  Chart creation errors have been sought, but may not always  have been located. Such creation errors do not reflect on  the standard of medical care.

## 2017-04-15 ENCOUNTER — Other Ambulatory Visit (INDEPENDENT_AMBULATORY_CARE_PROVIDER_SITE_OTHER): Payer: Self-pay | Admitting: Radiology

## 2017-04-15 ENCOUNTER — Ambulatory Visit
Admission: RE | Admit: 2017-04-15 | Discharge: 2017-04-15 | Disposition: A | Discharge status: Home / Self Care | Payer: Managed Care, Other (non HMO) | Source: Ambulatory Visit | Attending: Orthopaedic Surgery | Admitting: Orthopaedic Surgery

## 2017-04-15 DIAGNOSIS — G8929 Other chronic pain: Secondary | ICD-10-CM

## 2017-04-15 DIAGNOSIS — M5441 Lumbago with sciatica, right side: Principal | ICD-10-CM

## 2017-04-15 MED ORDER — METHYLPREDNISOLONE ACETATE 40 MG/ML INJ SUSP (RADIOLOG
120.0000 mg | Freq: Once | INTRAMUSCULAR | Status: AC
  Administered 2017-04-15: 120 mg via EPIDURAL

## 2017-04-15 MED ORDER — IOPAMIDOL (ISOVUE-M 200) INJECTION 41%
1.0000 mL | Freq: Once | INTRAMUSCULAR | Status: AC
  Administered 2017-04-15: 1 mL via EPIDURAL

## 2017-04-15 NOTE — Discharge Instructions (Signed)

## 2017-04-20 ENCOUNTER — Encounter: Payer: Self-pay | Admitting: *Deleted

## 2017-04-20 ENCOUNTER — Ambulatory Visit: Payer: Managed Care, Other (non HMO) | Admitting: Rheumatology

## 2017-04-20 ENCOUNTER — Encounter: Payer: Self-pay | Admitting: Physician Assistant

## 2017-04-20 VITALS — BP 142/77 | HR 90 | Resp 14 | Ht 72.0 in | Wt 200.0 lb

## 2017-04-20 DIAGNOSIS — Z96642 Presence of left artificial hip joint: Secondary | ICD-10-CM

## 2017-04-20 DIAGNOSIS — Z8709 Personal history of other diseases of the respiratory system: Secondary | ICD-10-CM | POA: Diagnosis not present

## 2017-04-20 DIAGNOSIS — Z79899 Other long term (current) drug therapy: Secondary | ICD-10-CM | POA: Diagnosis not present

## 2017-04-20 DIAGNOSIS — Z8719 Personal history of other diseases of the digestive system: Secondary | ICD-10-CM

## 2017-04-20 DIAGNOSIS — G5712 Meralgia paresthetica, left lower limb: Secondary | ICD-10-CM | POA: Diagnosis not present

## 2017-04-20 DIAGNOSIS — G5711 Meralgia paresthetica, right lower limb: Secondary | ICD-10-CM

## 2017-04-20 DIAGNOSIS — F5101 Primary insomnia: Secondary | ICD-10-CM | POA: Diagnosis not present

## 2017-04-20 DIAGNOSIS — M461 Sacroiliitis, not elsewhere classified: Secondary | ICD-10-CM | POA: Diagnosis not present

## 2017-04-20 DIAGNOSIS — L409 Psoriasis, unspecified: Secondary | ICD-10-CM | POA: Diagnosis not present

## 2017-04-20 DIAGNOSIS — L4050 Arthropathic psoriasis, unspecified: Secondary | ICD-10-CM

## 2017-04-20 LAB — COMPLETE METABOLIC PANEL WITH GFR
AG RATIO: 2.2 (calc) (ref 1.0–2.5)
ALBUMIN MSPROF: 5 g/dL (ref 3.6–5.1)
ALKALINE PHOSPHATASE (APISO): 50 U/L (ref 40–115)
ALT: 12 U/L (ref 9–46)
AST: 13 U/L (ref 10–40)
BUN: 15 mg/dL (ref 7–25)
CO2: 30 mmol/L (ref 20–32)
Calcium: 10 mg/dL (ref 8.6–10.3)
Chloride: 104 mmol/L (ref 98–110)
Creat: 0.97 mg/dL (ref 0.60–1.35)
GFR, EST NON AFRICAN AMERICAN: 95 mL/min/{1.73_m2} (ref >=60)
GFR, Est African American: 110 mL/min/{1.73_m2} (ref >=60)
GLOBULIN: 2.3 g/dL (ref 1.9–3.7)
Glucose, Bld: 88 mg/dL (ref 65–99)
POTASSIUM: 4.8 mmol/L (ref 3.5–5.3)
SODIUM: 139 mmol/L (ref 135–146)
Total Bilirubin: 0.7 mg/dL (ref 0.2–1.2)
Total Protein: 7.3 g/dL (ref 6.1–8.1)

## 2017-04-20 LAB — CBC WITH DIFFERENTIAL/PLATELET
BASOS ABS: 41 {cells}/uL (ref 0–200)
Basophils Relative: 0.5 %
EOS ABS: 74 {cells}/uL (ref 15–500)
Eosinophils Relative: 0.9 %
HEMATOCRIT: 44.3 % (ref 38.5–50.0)
HEMOGLOBIN: 15.5 g/dL (ref 13.2–17.1)
LYMPHS ABS: 2116 {cells}/uL (ref 850–3900)
MCH: 32.2 pg (ref 27.0–33.0)
MCHC: 35 g/dL (ref 32.0–36.0)
MCV: 92.1 fL (ref 80.0–100.0)
MPV: 12.4 fL (ref 7.5–12.5)
Monocytes Relative: 7.2 %
NEUTROS ABS: 5379 {cells}/uL (ref 1500–7800)
Neutrophils Relative %: 65.6 %
Platelets: 204 10*3/uL (ref 140–400)
RBC: 4.81 10*6/uL (ref 4.20–5.80)
RDW: 12.6 % (ref 11.0–15.0)
Total Lymphocyte: 25.8 %
WBC: 8.2 10*3/uL (ref 3.8–10.8)
WBCMIX: 590 {cells}/uL (ref 200–950)

## 2017-04-20 NOTE — Patient Instructions (Signed)
Standing Labs We placed an order today for your standing lab work.    Please come back and get your standing labs in July and every 3 months   We have open lab Monday through Friday from 8:30-11:30 AM and 1:30-4:00 PM  at the office of Dr. Shaili Deveshwar.   You may experience shorter wait times on Monday and Friday afternoons. The office is located at [address], [address], [place], [place] [address] No appointment is necessary.   Labs are drawn by [facility].  You may receive a bill from [facility] for your lab work. If you have any questions regarding directions or hours of operation,  please call [phone].    

## 2017-04-21 NOTE — Progress Notes (Signed)
Labs are WNL.

## 2017-05-07 ENCOUNTER — Encounter (INDEPENDENT_AMBULATORY_CARE_PROVIDER_SITE_OTHER): Payer: Self-pay | Admitting: Orthopaedic Surgery

## 2017-05-07 ENCOUNTER — Ambulatory Visit (INDEPENDENT_AMBULATORY_CARE_PROVIDER_SITE_OTHER): Payer: Managed Care, Other (non HMO) | Admitting: Orthopaedic Surgery

## 2017-05-07 VITALS — BP 135/79 | HR 69 | Ht 72.0 in | Wt 200.0 lb

## 2017-05-07 DIAGNOSIS — M5441 Lumbago with sciatica, right side: Secondary | ICD-10-CM | POA: Diagnosis not present

## 2017-05-07 DIAGNOSIS — G8929 Other chronic pain: Secondary | ICD-10-CM

## 2017-05-07 DIAGNOSIS — M4726 Other spondylosis with radiculopathy, lumbar region: Secondary | ICD-10-CM

## 2017-05-07 NOTE — Patient Instructions (Signed)

## 2017-05-07 NOTE — Progress Notes (Signed)
Office Visit Note   Patient: Charles [name]           Date of Birth: 1970/07/17           MRN: [id number] Visit Date: 05/07/2017              Requested by: Celene Squibb, MD [place], [place] [address] PCP: Celene Squibb, MD   Assessment & Plan: Visit Diagnoses:  1. Chronic right-sided low back pain with right-sided sciatica   2. Osteoarthritis of spine with radiculopathy, lumbar region     Plan: Recently evaluated for chronic low back pain associated with right lower extremity radiculopathy.  MRI scan demonstrated degenerative disc bulge with facet hypertrophy at L5-S1 and moderate to advanced bilateral L5 foraminal stenosis.  He also had a disc bulge at L4-5 and potentially irritated the exiting right L4 nerve root.  He had an epidural steroid injection performed in [facility] imaging and notes that all of his leg pain has resolved.  Still has some back discomfort.  That is related to his degenerative disc disease.  Given a set of exercises and plan to see him back over the next several months.  Not having any pain referable to his right total hip replacement  Follow-Up Instructions: No follow-ups on file.   Orders:  No orders of the defined types were placed in this encounter.  No orders of the defined types were placed in this encounter.     Procedures: No procedures performed   Clinical Data: No additional findings.   Subjective: Chief Complaint  Patient presents with  . Lower Back - Pain  Feeling much better in terms of right leg pain.  In fact not experiencing any groin or thigh discomfort that he was having prior to the injection.  Still has some back pain that should be related to his arthritis.  No fever chills  HPI  Review of Systems  Constitutional: Negative.   HENT: Negative.   Eyes: Negative.   Respiratory: Negative.   Cardiovascular: Negative.   Gastrointestinal: Negative.   Endocrine: Negative.   Genitourinary: Negative.     Musculoskeletal: Positive for back pain.  Skin: Negative.   Allergic/Immunologic: Negative.   Neurological: Negative.   Hematological: Negative.   Psychiatric/Behavioral: Negative.      Objective: Vital Signs: BP 135/79   Pulse 69   Ht 6' (1.829 m)   Wt 200 lb (90.7 kg)   BMI 27.12 kg/m   Physical Exam  Constitutional: He is oriented to person, place, and time. He appears well-developed and well-nourished.  HENT:  Mouth/Throat: Oropharynx is clear and moist.  Eyes: Pupils are equal, round, and reactive to light. EOM are normal.  Pulmonary/Chest: Effort normal.  Neurological: He is alert and oriented to person, place, and time.  Skin: Skin is warm and dry.  Psychiatric: He has a normal mood and affect. His behavior is normal.    Ortho Exam awake alert and oriented x3.  Comfortable sitting.  Walks without a limp.  Straight leg raise negative bilaterally.  Painless range of motion of both hips.  Neurovascular exam intact.  No percussible tenderness of lumbar spine  Specialty Comments:  No specialty comments available.  Imaging: No results found.   PMFS History: Patient Active Problem List   Diagnosis Date Noted  . Chronic right-sided low back pain with right-sided sciatica 04/05/2017  . 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 Bilateral sacroiliitis ([place]) 05/22/2016  .  Psoriatic arthritis ([place]) 05/22/2016  . High risk medications (not anticoagulants) long-term use 05/22/2016  . DJD (degenerative joint disease), cervical 05/22/2016  . Osteoarthritis of spine with radiculopathy, lumbar region 05/22/2016  . Cephalalgia 03/16/2016  . Chronic daily headache 03/16/2016  . Neck pain 03/16/2016  . Hemorrhoids 03/13/2016  . 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 Insomnia 12/26/2015  . Abdominal pain, epigastric 11/13/2015  . Dark stools 11/13/2015  . Rectal bleeding 11/13/2015  . Numbness 09/26/2015  . Neuropathic pain  involving left lateral femoral cutaneous nerve 09/26/2015  . Avascular necrosis of left femoral head ([place]) 07/30/2015  . Avascular necrosis of right femoral head ([place]) 07/30/2015  . Psoriasis 07/30/2015  . Chronic obstructive pulmonary disease (COPD) ([place]) 07/30/2015  . 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   Past Medical History:  Diagnosis Date  . Arthritis    also has avascular necrosis   . Chronic back pain   . Chronic neck pain   . Chronic shoulder pain   . COPD (chronic obstructive pulmonary disease) ([place])   . GERD (gastroesophageal reflux disease)    tums  OTC  medication  . Headache   . Hypothyroidism   . Sleep apnea    tested more than 5 yrs ago...negative results    Family History  Problem Relation Age of Onset  . Heart failure Mother   . Diabetes Mother   . Hypertension Mother   . Heart failure Father   . Prostate cancer Father   . Colon cancer Neg Hx     Past Surgical History:  Procedure Laterality Date  . COLONOSCOPY WITH PROPOFOL N/A 12/09/2015   Procedure: COLONOSCOPY WITH PROPOFOL;  Surgeon: Daneil Dolin, MD;  [facility];  Service: Endoscopy;  Laterality: N/A;  1100  . ESOPHAGOGASTRODUODENOSCOPY (EGD) WITH PROPOFOL N/A 12/09/2015   Procedure: ESOPHAGOGASTRODUODENOSCOPY (EGD) WITH PROPOFOL;  Surgeon: Daneil Dolin, MD;  [facility];  Service: Endoscopy;  Laterality: N/A;  . JOINT REPLACEMENT     left hip 2017  . NECK SURGERY    . SHOULDER SURGERY    . TOTAL HIP ARTHROPLASTY Left 07/30/2015   Procedure: TOTAL HIP ARTHROPLASTY;  Surgeon: Garald Balding, MD;  Location: [facility];  Service: Orthopedics;  Laterality: Left;  . TOTAL HIP ARTHROPLASTY Right 09/22/2016  . TOTAL HIP ARTHROPLASTY Right 09/22/2016   Procedure: RIGHT TOTAL HIP ARTHROPLASTY;  Surgeon: Garald Balding, MD;  Location: [facility];  Service: Orthopedics;  Laterality: Right;   Social History   Occupational History  . Not on file  Tobacco Use  .  Smoking status: Former Smoker    Packs/day: 0.50    Years: 25.00    Pack years: 12.50    Types: Cigarettes, E-cigarettes    Last attempt to quit: 11/03/2016    Years since quitting: 0.5  . Smokeless tobacco: Never Used  Substance and Sexual Activity  . Alcohol use: Yes    Alcohol/week: 3.6 oz    Types: 6 Cans of beer per week  . Drug use: No    Comment: states quit  . Sexual activity: Not on file

## 2017-05-10 ENCOUNTER — Other Ambulatory Visit: Payer: Self-pay | Admitting: Rheumatology

## 2017-05-10 NOTE — Telephone Encounter (Signed)
Last Visit: 04/20/17 Next visit: 07/19/17 Labs: 04/20/17 WNL TB Gold: 01/18/17 Neg   Okay to refill per Dr. Estanislado Pandy

## 2017-05-11 ENCOUNTER — Ambulatory Visit: Payer: Managed Care, Other (non HMO) | Admitting: Nurse Practitioner

## 2017-05-11 ENCOUNTER — Encounter: Payer: Self-pay | Admitting: Nurse Practitioner

## 2017-05-11 VITALS — BP 144/85 | HR 80 | Temp 98.2°F | Ht 72.0 in | Wt 201.0 lb

## 2017-05-11 DIAGNOSIS — K625 Hemorrhage of anus and rectum: Secondary | ICD-10-CM

## 2017-05-11 DIAGNOSIS — K5900 Constipation, unspecified: Secondary | ICD-10-CM | POA: Insufficient documentation

## 2017-05-11 DIAGNOSIS — K649 Unspecified hemorrhoids: Secondary | ICD-10-CM | POA: Diagnosis not present

## 2017-05-11 NOTE — Patient Instructions (Addendum)
1. Take Colace over-the-counter stool softener once daily. 2. Take fiber supplement once daily. 3. Try to limit toilet time to 5 minutes. 4. Continue taking suppositories to help your hemorrhoids. 5. We will schedule a follow-up appointment for you with Dr. Gala Romney for repeat hemorrhoid banding. 6. Call us if you have any questions or concerns.  At [facility] we value your feedback. You may receive a survey about your visit today. Please share your experience as we strive to create trusting relationships with our patients to provide genuine, compassionate, quality care.  It was good to see you today!  I hope you have a good summer!!

## 2017-05-11 NOTE — Assessment & Plan Note (Signed)
History of known hemorrhoids on colonoscopy in December 2017.  Status post in office banding of 2 out of 3 columns of hemorrhoids.  Recommended anti-constipation measures, limiting toilet time.  The third collop was not banded because his symptoms resolved.  He is now having returning hemorrhoid symptoms including rectal irritation/pain as well as toilet tissue hematochezia.  He is requesting further treatment options.  Suppositories have not helped significantly.  Rectal exam revealed at least one column of small hemorrhoids.  No obvious/frank blood on exam.

## 2017-05-11 NOTE — Assessment & Plan Note (Signed)
Noted soft stools but 10+ minutes on the commode and difficult to pass.  Likely functional constipation.  He is not on fiber or stool softeners as previously recommended.  At this time we will have him start Colace once daily, fiber really to prevent constipation.  This is likely a source of worsening hemorrhoid symptoms.  Abdominal pain likely due to constipation as it typically improves after bowel movement.

## 2017-05-11 NOTE — Assessment & Plan Note (Signed)
Rectal bleeding likely due to hemorrhoids given presentation.  Colonoscopy is up-to-date and he is not due for another 3 years.  Will discuss with Dr. Gala Romney about possible repeat in office banding.

## 2017-05-11 NOTE — Progress Notes (Signed)
cc'ed to pcp °

## 2017-05-11 NOTE — Progress Notes (Signed)
Referring Provider: Celene Squibb, MD Primary Care Physician:  Celene Squibb, MD Primary GI:  Dr. Gala Romney  Chief Complaint  Patient presents with  . Rectal Bleeding    HPI:   Charles [name] is a 47 y.o. male who presents rectal bleeding.  The patient was last seen in our office 04/14/2016 for internal hemorrhoids.  Colonoscopy up-to-date 12/09/2015 which found four millimeters polyps in the sigmoid colon, descending colon status post removal.  We found the ups to be a mix of hyperplastic and tubular adenoma.  Recommended continue current medications, repeat colonoscopy in 5 years (2022).   At his last visit it was noted the patient complains of symptomatic grade 1 hemorrhoids unresponsive to maximal medical therapy and at that day he presented for band ligation.  Anoscopy revealed prominent left lateral hemorrhoid column only, no fissure abnormality appreciated.  The left lateral internal hemorrhoid was banded and digital anorectal exam assured proper positioning, no noted pinching or pain.  Dietary and behavioral recommendations were made and recommended follow-up in 4 weeks for possible additional banding as required.  Conditions for avoid straining, Benefiber twice daily, limit toilet time to 5 minutes.  At his follow-up on 05/19/2016 noted improvement in symptoms but not resolution and the patient desired an additional band placement.  Right internal anterior hemorrhoid was banded, noted good placement without pain.  Recommended 4-week follow-up.  Has follow-up it was noted right posterior area has not been banded however he stated that all itching, burning, bleeding has resolved and he was pleased with therapy.  Continues to avoid straining and takes daily fiber.  Reflux well controlled on Protonix 40 mg daily.  Reinforced previous recommendations, call with any problems.  Follow-up in 6 months.  Today he states he's doing ok overall. Began having recurrent rectal bleeding about 3-4 weeks ago.  Associated with rectal pain/irritation as he did previously with hemorrhoids. Feels like he has hemorrhoids again. Has lower abdominal pain, intermittent, described as sharp cramp; improves after a bowel movement. Has a bowel movement about once a day, soft but "not easy to pass." Not on stools softeners or fiber currently although he admits he needs to. Intermittent nausea, no vomiting. Blood is only after a bowel movement, toilet tissue hematochezia (not in commode or on stool). Denies melena, fever, chills, unintentional weight loss. Has tried rectal suppositories, which did not help his current symptoms. When he has a bowel movement is typically on the toilet "10 minutes." Denies chest pain, dyspnea, dizziness, lightheadedness, syncope, near syncope. Denies any other upper or lower GI symptoms.   Past Medical History:  Diagnosis Date  . Arthritis    also has avascular necrosis   . Chronic back pain   . Chronic neck pain   . Chronic shoulder pain   . COPD (chronic obstructive pulmonary disease) ([place])   . GERD (gastroesophageal reflux disease)    tums  OTC  medication  . Headache   . Hypothyroidism   . Sleep apnea    tested more than 5 yrs ago...negative results    Past Surgical History:  Procedure Laterality Date  . COLONOSCOPY WITH PROPOFOL N/A 12/09/2015   Procedure: COLONOSCOPY WITH PROPOFOL;  Surgeon: Daneil Dolin, MD;  [facility];  Service: Endoscopy;  Laterality: N/A;  1100  . ESOPHAGOGASTRODUODENOSCOPY (EGD) WITH PROPOFOL N/A 12/09/2015   Procedure: ESOPHAGOGASTRODUODENOSCOPY (EGD) WITH PROPOFOL;  Surgeon: Daneil Dolin, MD;  [facility];  Service: Endoscopy;  Laterality: N/A;  .  JOINT REPLACEMENT     left hip 2017  . NECK SURGERY    . SHOULDER SURGERY    . TOTAL HIP ARTHROPLASTY Left 07/30/2015   Procedure: TOTAL HIP ARTHROPLASTY;  Surgeon: Garald Balding, MD;  Location: [place];  Service: Orthopedics;  Laterality: Left;  . TOTAL HIP ARTHROPLASTY  Right 09/22/2016  . TOTAL HIP ARTHROPLASTY Right 09/22/2016   Procedure: RIGHT TOTAL HIP ARTHROPLASTY;  Surgeon: Garald Balding, MD;  Location: [place];  Service: Orthopedics;  Laterality: Right;    Current Outpatient Medications  Medication Sig Dispense Refill  . celecoxib (CELEBREX) 200 MG capsule Take 1 capsule (200 mg total) by mouth 2 (two) times daily. 60 capsule 2  . COSENTYX SENSOREADY 300 DOSE 150 MG/ML SOAJ INJECT 300MG  (TWO-150MG  INJECTIONS) SUBCUTANEOUSLY EVERY 4 WEEKS 6 pen 0  . cyclobenzaprine (FLEXERIL) 5 MG tablet Take 5 mg by mouth 3 (three) times daily as needed for muscle spasms.     Marland Kitchen oxyCODONE-acetaminophen (PERCOCET/ROXICET) 5-325 MG tablet Take 1-2 tablets by mouth every 4 (four) hours as needed for moderate pain or severe pain. 80 tablet 0  . pantoprazole (PROTONIX) 40 MG tablet Take 40 mg by mouth daily.  0   No current facility-administered medications for this visit.     Allergies as of 05/11/2017  . (No Known Allergies)    Family History  Problem Relation Age of Onset  . Heart failure Mother   . Diabetes Mother   . Hypertension Mother   . Heart failure Father   . Prostate cancer Father   . Colon cancer Neg Hx     Social History   Socioeconomic History  . Marital status: Married    Spouse name: Not on file  . Number of children: Not on file  . Years of education: Not on file  . Highest education level: Not on file  Occupational History  . Not on file  Social Needs  . Financial resource strain: Not on file  . Food insecurity:    Worry: Not on file    Inability: Not on file  . Transportation needs:    Medical: Not on file    Non-medical: Not on file  Tobacco Use  . Smoking status: Former Smoker    Packs/day: 0.50    Years: 25.00    Pack years: 12.50    Types: Cigarettes, E-cigarettes    Last attempt to quit: 11/03/2016    Years since quitting: 0.5  . Smokeless tobacco: Never Used  Substance and Sexual Activity  . Alcohol use: Yes     Alcohol/week: 3.6 oz    Types: 6 Cans of beer per week  . Drug use: No    Comment: states quit  . Sexual activity: Not on file  Lifestyle  . Physical activity:    Days per week: Not on file    Minutes per session: Not on file  . Stress: Not on file  Relationships  . Social connections:    Talks on phone: Not on file    Gets together: Not on file    Attends religious service: Not on file    Active member of club or organization: Not on file    Attends meetings of clubs or organizations: Not on file    Relationship status: Not on file  Other Topics Concern  . Not on file  Social History Narrative  . Not on file    Review of Systems: General: Negative for anorexia, weight loss, fever, chills, fatigue,  weakness. ENT: Negative for hoarseness, difficulty swallowing. CV: Negative for chest pain, angina, palpitations, peripheral edema.  Respiratory: Negative for dyspnea at rest, cough, sputum, wheezing.  GI: See history of present illness. Endo: Negative for unusual weight change.  Heme: Negative for bruising or bleeding.   Physical Exam: BP (!) 144/85   Pulse 80   Temp 98.2 F (36.8 C) (Oral)   Ht 6' (1.829 m)   Wt 201 lb (91.2 kg)   BMI 27.26 kg/m  General:   Alert and oriented. Pleasant and cooperative. Well-nourished and well-developed.  Eyes:  Without icterus, sclera clear and conjunctiva pink.  Ears:  Normal auditory acuity. Cardiovascular:  S1, S2 present without murmurs appreciated. Extremities without clubbing or edema. Respiratory:  Clear to auscultation bilaterally. No wheezes, rales, or rhonchi. No distress.  Gastrointestinal:  +BS, soft, non-tender and non-distended. No HSM noted. No guarding or rebound. No masses appreciated.  Rectal:  No external abnormalities. Appreciated a small posterior hemorrhoid, no frank blood.  Musculoskalatal:  Symmetrical without gross deformities. Normal posture. Neurologic:  Alert and oriented x4;  grossly normal  neurologically. Psych:  Alert and cooperative. Normal mood and affect. Heme/Lymph/Immune: No excessive bruising noted.    05/11/2017 9:53 AM   Disclaimer: This note was dictated with voice recognition software. Similar sounding words can inadvertently be transcribed and may not be corrected upon review.

## 2017-05-21 ENCOUNTER — Ambulatory Visit: Payer: Managed Care, Other (non HMO) | Admitting: Gastroenterology

## 2017-05-24 ENCOUNTER — Encounter: Payer: Self-pay | Admitting: Internal Medicine

## 2017-07-05 NOTE — Progress Notes (Addendum)
Office Visit Note  Patient: Charles [name]             Date of Birth: [birth year]           MRN: [id number]             PCP: Celene Squibb, MD Referring: Celene Squibb, MD Visit Date: 07/19/2017 Occupation: @GUAROCC @    Subjective:  Medication monitoring   History of Present Illness: Charles [name] is a 47 y.o. male with history of psoriatic arthritis and DDD. Patient continues to inject Cosentyx 300 mg subcutaneously once a month.  He has been tolerating the injections well.  He feels as though the injections have been lasting him a full month without breakthrough pain.  He denies missing any doses of Cosentyx recently.  He denies any psoriatic arthritis flares.  He continues to have chronic lower back pain and bilateral SI joint pain.  He reports that his lower back is very stiff when she first thing in the morning.  He states that he continues to see Dr. Durward Fortes.  He was referred to [facility] imaging for an epidural injection which provided relief.  He denies any symptoms of sciatica at this time.  He denies any other joint pain or joint swelling at this time.  He denies any active psoriasis.  He denies any Achilles tendinitis or plantar fasciitis.  He continues to have chronic neck stiffness.     Activities of Daily Living:  Patient reports morning stiffness for 1   hour.   Patient Reports nocturnal pain.  Difficulty dressing/grooming: Denies Difficulty climbing stairs: Denies Difficulty getting out of chair: Denies Difficulty using hands for taps, buttons, cutlery, and/or writing: Denies   Review of Systems  Constitutional: Positive for fatigue. Negative for night sweats.  HENT: Negative for mouth sores, mouth dryness and nose dryness.   Eyes: Negative for redness, visual disturbance and dryness.  Respiratory: Negative for cough, shortness of breath and difficulty breathing.   Cardiovascular: Negative for chest pain, palpitations, hypertension, irregular heartbeat and swelling in  legs/feet.  Gastrointestinal: Negative for blood in stool, constipation and diarrhea.  Endocrine: Negative for increased urination.  Genitourinary: Negative for painful urination.  Musculoskeletal: Positive for arthralgias, joint pain and morning stiffness. Negative for joint swelling, myalgias, muscle weakness, muscle tenderness and myalgias.  Skin: Negative for color change, rash, hair loss, nodules/bumps, skin tightness, ulcers and sensitivity to sunlight.  Allergic/Immunologic: Negative for susceptible to infections.  Neurological: Negative for dizziness, fainting, memory loss, night sweats and weakness.  Hematological: Negative for swollen glands.  Psychiatric/Behavioral: Negative for depressed mood and sleep disturbance. The patient is not nervous/anxious.     PMFS History:  Patient Active Problem List   Diagnosis Date Noted  . Constipation 05/11/2017  . Chronic right-sided low back pain with right-sided sciatica 04/05/2017  . 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 Bilateral sacroiliitis ([place]) 05/22/2016  . Psoriatic arthritis ([place]) 05/22/2016  . High risk medications (not anticoagulants) long-term use 05/22/2016  . DJD (degenerative joint disease), cervical 05/22/2016  . Osteoarthritis of spine with radiculopathy, lumbar region 05/22/2016  . Cephalalgia 03/16/2016  . Chronic daily headache 03/16/2016  . Neck pain 03/16/2016  . Hemorrhoids 03/13/2016  . 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 Insomnia 12/26/2015  . Abdominal pain, epigastric 11/13/2015  . Dark stools 11/13/2015  . Rectal bleeding 11/13/2015  . Numbness 09/26/2015  . 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  Avascular necrosis of left femoral head ([place]) 07/30/2015  . Avascular necrosis of right femoral head ([place]) 07/30/2015  . Psoriasis 07/30/2015  . Chronic obstructive pulmonary disease (COPD) ([place]) 07/30/2015  . Status post  total replacement of left hip 07/30/2015    Past Medical History:  Diagnosis Date  . Arthritis    also has avascular necrosis   . Chronic back pain   . Chronic neck pain   . Chronic shoulder pain   . COPD (chronic obstructive pulmonary disease) ([place])   . GERD (gastroesophageal reflux disease)    tums  OTC  medication  . Headache   . Hypothyroidism   . Sleep apnea    tested more than 5 yrs ago...negative results    Family History  Problem Relation Age of Onset  . Heart failure Mother   . Diabetes Mother   . Hypertension Mother   . Heart failure Father   . Prostate cancer Father   . Healthy Daughter   . Colon cancer Neg Hx    Past Surgical History:  Procedure Laterality Date  . COLONOSCOPY WITH PROPOFOL N/A 12/09/2015   Procedure: COLONOSCOPY WITH PROPOFOL;  Surgeon: Daneil Dolin, MD;  [facility];  Service: Endoscopy;  Laterality: N/A;  1100  . ESOPHAGOGASTRODUODENOSCOPY (EGD) WITH PROPOFOL N/A 12/09/2015   Procedure: ESOPHAGOGASTRODUODENOSCOPY (EGD) WITH PROPOFOL;  Surgeon: Daneil Dolin, MD;  [facility];  Service: Endoscopy;  Laterality: N/A;  . JOINT REPLACEMENT     left hip 2017  . NECK SURGERY    . SHOULDER SURGERY    . TOTAL HIP ARTHROPLASTY Left 07/30/2015   Procedure: TOTAL HIP ARTHROPLASTY;  Surgeon: Garald Balding, MD;  Location: [place];  Service: Orthopedics;  Laterality: Left;  . TOTAL HIP ARTHROPLASTY Right 09/22/2016  . TOTAL HIP ARTHROPLASTY Right 09/22/2016   Procedure: RIGHT TOTAL HIP ARTHROPLASTY;  Surgeon: Garald Balding, MD;  Location: [place];  Service: Orthopedics;  Laterality: Right;   Social History   Social History Narrative  . Not on file     Objective: Vital Signs: BP (!) 143/79 (BP Location: Left Arm, Patient Position: Sitting, Cuff Size: Normal)   Pulse 88   Resp 15   Ht 6' (1.829 m)   Wt 204 lb (92.5 kg)   BMI 27.67 kg/m    Physical Exam  Constitutional: He is oriented to person, place, and time. He  appears well-developed and well-nourished.  HENT:  Head: Normocephalic and atraumatic.  Eyes: Pupils are equal, round, and reactive to light. Conjunctivae and EOM are normal.  Neck: Normal range of motion. Neck supple.  Cardiovascular: Normal rate, regular rhythm and normal heart sounds.  Pulmonary/Chest: Effort normal and breath sounds normal.  Abdominal: Soft. Bowel sounds are normal.  Lymphadenopathy:    He has no cervical adenopathy.  Neurological: He is alert and oriented to person, place, and time.  Skin: Skin is warm and dry. Capillary refill takes less than 2 seconds.  Psychiatric: He has a normal mood and affect. His behavior is normal.  Nursing note and vitals reviewed.    Musculoskeletal Exam: C-spine good range of motion.  Limited range of motion of thoracic and lumbar spine.  He has midline spinal tenderness in the lumbar region.  He has bilateral SI joint tenderness.  Shoulder joints, elbow joints, wrist joints, MCPs, PIPs, DIPs good range of motion no synovitis.  He has PIP and DIP synovial thickening.  Hip joints, knee joints, ankle joints, MTPs, PIPs, DIPs  good range of motion no synovitis.  No warmth or effusion of bilateral knee joints.  He has bilateral knee crepitus.  No Achilles tendinitis or plantar fasciitis.  No tenderness of trochanteric bursa bilaterally.  CDAI Exam: CDAI Homunculus Exam:   Joint Counts:  CDAI Tender Joint count: 0 CDAI Swollen Joint count: 0  Global Assessments:  Patient Global Assessment: 5 Provider Global Assessment: 5  CDAI Calculated Score: 10    Investigation: No additional findings.TB Gold: 01/18/2017 Negative  CBC Latest Ref Rng & Units 04/20/2017 01/18/2017 09/24/2016  WBC 3.8 - 10.8 Thousand/uL 8.2 7.0 10.0  Hemoglobin 13.2 - 17.1 g/dL 15.5 14.7 11.7(L)  Hematocrit 38.5 - 50.0 % 44.3 42.6 35.0(L)  Platelets 140 - 400 Thousand/uL 204 167 128(L)   CMP Latest Ref Rng & Units 04/20/2017 01/18/2017 09/24/2016  Glucose 65 - 99 mg/dL  88 95 128(H)  BUN 7 - 25 mg/dL 15 15 9   Creatinine 0.60 - 1.35 mg/dL 0.97 0.96 0.86  Sodium 135 - 146 mmol/L 139 139 135  Potassium 3.5 - 5.3 mmol/L 4.8 4.4 3.7  Chloride 98 - 110 mmol/L 104 103 105  CO2 20 - 32 mmol/L 30 29 24   Calcium 8.6 - 10.3 mg/dL 10.0 9.5 8.5(L)  Total Protein 6.1 - 8.1 g/dL 7.3 7.0 -  Total Bilirubin 0.2 - 1.2 mg/dL 0.7 0.5 -  Alkaline Phos 38 - 126 U/L - - -  AST 10 - 40 U/L 13 15 -  ALT 9 - 46 U/L 12 11 -    Imaging: Dg Knee Complete 4 Views Right  Result Date: 07/11/2017 CLINICAL DATA:  Puncture wound to the right knee yesterday EXAM: RIGHT KNEE - COMPLETE 4+ VIEW COMPARISON:  None. FINDINGS: No fracture. Trace suprapatellar right knee joint effusion. No dislocation. No suspicious focal osseous lesion. No significant degenerative arthropathy. No joint or cortical erosions. No radiopaque foreign body. IMPRESSION: Trace suprapatellar right knee joint effusion. No fracture, malalignment or osseous erosions. No radiopaque foreign body. Electronically Signed   By: Ilona Sorrel M.D.   On: 07/11/2017 15:24    Speciality Comments: No specialty comments available.    Procedures:  No procedures performed Allergies: Patient has no known allergies.   Assessment / Plan:     Visit Diagnoses: Psoriatic arthritis ([place]): He has no active synovitis or dactylitis on exam. He has complete fist formation bilaterally.  He has no tenderness of PIPs or DIP joints. He has PIP and DIP synovial thickening.  He continues to have bilateral SI joints discomfort.  He has no Achilles tendinitis or plantar fasciitis.  He has no active psoriasis at this time.  He has not had any recent flares of psoriatic arthritis. He has been injecting Cosentyx 300 mg subcutaneously once a month.  He has not missed any doses recently.  He will continue on his current treatment regimen.  A refill of Celebrex was sent to the pharmacy. CBC and CMP were drawn today to monitor for drug toxicity. He will follow-up  in 5 months.  He is advised to notify us if he develops increased joint pain or joint swelling.  Psoriasis: He has no psoriasis at this time.  No nail pitting noted  High risk medications (not anticoagulants) long-term use - Cosentyx 300 mg every month.  CBC and CMP were drawn today to monitor for drug toxicity..  TB gold negative on 01/18/17.  - Plan: CBC with Differential/Platelet, COMPLETE METABOLIC PANEL WITH GFR  Bilateral sacroiliitis ([facility]) -  He has chronic  bilateral SI joint pain.  He had an MRI of his lumbar spine which revealed degenerative changes at L2-3, L3-L4, L4-L5, and L5-S1 as well as foraminal stenosis at L4-L5 and L5-S. He had an epidural injection in April 2019, which improved his pain.  He denies any symptoms of sciatica at this time.  He continues to follow up with Dr. Durward Fortes on a regular basis.   DDD (degenerative disc disease), cervical: s/p fusion- He has no discomfort with ROM.   DDD (degenerative disc disease), lumbar: Chronic pain. He has midline spinal tenderness in the lumbar region. He has limited ROM of the lumbar spine.   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  No discomfort at this time.   Primary insomnia: He experiences pain at night.   Other medical conditions are listed as follows:   History of gastroesophageal reflux (GERD)  History of COPD    Orders: Orders Placed This Encounter  Procedures  . CBC with Differential/Platelet  . COMPLETE METABOLIC PANEL WITH GFR   Meds ordered this encounter  Medications  . celecoxib (CELEBREX) 200 MG capsule    Sig: Take 1 capsule (200 mg total) by mouth 2 (two) times daily.    Dispense:  60 capsule    Refill:  2     Follow-Up Instructions: Return in about 5 months (around 12/19/2017) for Psoriatic arthritis, DDD.   Ofilia Neas,  PA-C  Note - This record has been created using Dragon software.  Chart creation errors have been sought, but may not always  have been located. Such creation errors do not reflect on  the standard of medical care.

## 2017-07-11 ENCOUNTER — Emergency Department (HOSPITAL_COMMUNITY)
Admission: EM | Admit: 2017-07-11 | Discharge: 2017-07-11 | Disposition: A | Discharge status: Home / Self Care | Payer: Managed Care, Other (non HMO) | Attending: Emergency Medicine | Admitting: Emergency Medicine

## 2017-07-11 ENCOUNTER — Other Ambulatory Visit: Payer: Self-pay

## 2017-07-11 ENCOUNTER — Encounter (HOSPITAL_COMMUNITY): Payer: Self-pay | Admitting: Emergency Medicine

## 2017-07-11 ENCOUNTER — Emergency Department (HOSPITAL_COMMUNITY): Payer: Managed Care, Other (non HMO)

## 2017-07-11 DIAGNOSIS — Y929 Unspecified place or not applicable: Secondary | ICD-10-CM | POA: Insufficient documentation

## 2017-07-11 DIAGNOSIS — Y939 Activity, unspecified: Secondary | ICD-10-CM | POA: Insufficient documentation

## 2017-07-11 DIAGNOSIS — S81031A Puncture wound without foreign body, right knee, initial encounter: Secondary | ICD-10-CM | POA: Insufficient documentation

## 2017-07-11 DIAGNOSIS — Z79899 Other long term (current) drug therapy: Secondary | ICD-10-CM | POA: Diagnosis not present

## 2017-07-11 DIAGNOSIS — M25461 Effusion, right knee: Secondary | ICD-10-CM | POA: Diagnosis not present

## 2017-07-11 DIAGNOSIS — Z87891 Personal history of nicotine dependence: Secondary | ICD-10-CM | POA: Diagnosis not present

## 2017-07-11 DIAGNOSIS — J449 Chronic obstructive pulmonary disease, unspecified: Secondary | ICD-10-CM | POA: Diagnosis not present

## 2017-07-11 DIAGNOSIS — Y999 Unspecified external cause status: Secondary | ICD-10-CM | POA: Insufficient documentation

## 2017-07-11 DIAGNOSIS — W268XXA Contact with other sharp object(s), not elsewhere classified, initial encounter: Secondary | ICD-10-CM | POA: Insufficient documentation

## 2017-07-11 DIAGNOSIS — S8991XA Unspecified injury of right lower leg, initial encounter: Secondary | ICD-10-CM | POA: Diagnosis present

## 2017-07-11 MED ORDER — DOXYCYCLINE HYCLATE 100 MG PO TABS
100.0000 mg | ORAL_TABLET | Freq: Once | ORAL | Status: AC
  Administered 2017-07-11: 100 mg via ORAL
  Filled 2017-07-11: qty 1

## 2017-07-11 MED ORDER — DOXYCYCLINE HYCLATE 100 MG PO CAPS: 100.0000 mg | ORAL_CAPSULE | Freq: Two times a day (BID) | ORAL | 0 refills | Status: DC

## 2017-07-11 NOTE — ED Provider Notes (Signed)
[facility] EMERGENCY DEPARTMENT Provider Note   CSN: [id number] Arrival date & time: 07/11/17  1400     History   Chief Complaint Chief Complaint  Patient presents with  . Leg Pain    HPI Charles [name] is a 47 y.o. male with multiple medical problems as listed in patient problem list who presents to the ED for leg pain. Patient reports that he got a piece of barbed wire stick in his leg yesterday. Patient has pain and swelling to the area. Patient states that he has been having problems with the knee swelling and having pain prior to the injury yesterday.  He reports tetanus is UTD.   HPI  Past Medical History:  Diagnosis Date  . Arthritis    also has avascular necrosis   . Chronic back pain   . Chronic neck pain   . Chronic shoulder pain   . COPD (chronic obstructive pulmonary disease) ([place])   . GERD (gastroesophageal reflux disease)    tums  OTC  medication  . Headache   . Hypothyroidism   . Sleep apnea    tested more than 5 yrs ago...negative results    Patient Active Problem List   Diagnosis Date Noted  . Constipation 05/11/2017  . Chronic right-sided low back pain with right-sided sciatica 04/05/2017  . 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 Bilateral sacroiliitis ([place]) 05/22/2016  . Psoriatic arthritis ([place]) 05/22/2016  . High risk medications (not anticoagulants) long-term use 05/22/2016  . DJD (degenerative joint disease), cervical 05/22/2016  . Osteoarthritis of spine with radiculopathy, lumbar region 05/22/2016  . Cephalalgia 03/16/2016  . Chronic daily headache 03/16/2016  . Neck pain 03/16/2016  . Hemorrhoids 03/13/2016  . 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 Insomnia 12/26/2015  . Abdominal pain, epigastric 11/13/2015  . Dark stools 11/13/2015  . Rectal bleeding 11/13/2015  . Numbness 09/26/2015  . 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 Avascular necrosis of  left femoral head ([place]) 07/30/2015  . Avascular necrosis of right femoral head ([place]) 07/30/2015  . Psoriasis 07/30/2015  . Chronic obstructive pulmonary disease (COPD) ([place]) 07/30/2015  . 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    Past Surgical History:  Procedure Laterality Date  . COLONOSCOPY WITH PROPOFOL N/A 12/09/2015   Procedure: COLONOSCOPY WITH PROPOFOL;  Surgeon: Daneil Dolin, MD;  [facility];  Service: Endoscopy;  Laterality: N/A;  1100  . ESOPHAGOGASTRODUODENOSCOPY (EGD) WITH PROPOFOL N/A 12/09/2015   Procedure: ESOPHAGOGASTRODUODENOSCOPY (EGD) WITH PROPOFOL;  Surgeon: Daneil Dolin, MD;  [facility];  Service: Endoscopy;  Laterality: N/A;  . JOINT REPLACEMENT     left hip 2017  . NECK SURGERY    . SHOULDER SURGERY    . TOTAL HIP ARTHROPLASTY Left 07/30/2015   Procedure: TOTAL HIP ARTHROPLASTY;  Surgeon: Garald Balding, MD;  Location: [place];  Service: Orthopedics;  Laterality: Left;  . TOTAL HIP ARTHROPLASTY Right 09/22/2016  . TOTAL HIP ARTHROPLASTY Right 09/22/2016   Procedure: RIGHT TOTAL HIP ARTHROPLASTY;  Surgeon: Garald Balding, MD;  Location: [place];  Service: Orthopedics;  Laterality: Right;        Home Medications    Prior to Admission medications   Medication Sig Start Date End Date Taking? Authorizing Provider  celecoxib (CELEBREX) 200 MG capsule Take 1 capsule (200 mg total) by mouth 2 (two) times daily. 02/05/17   Bo Merino, MD  [place]  SENSOREADY 300 DOSE 150 MG/ML SOAJ INJECT 300MG  (TWO-150MG  INJECTIONS) SUBCUTANEOUSLY EVERY 4 WEEKS 05/10/17   Bo Merino, MD  cyclobenzaprine (FLEXERIL) 5 MG tablet Take 5 mg by mouth 3 (three) times daily as needed for muscle spasms.  06/05/16   [provider]  doxycycline (VIBRAMYCIN) 100 MG capsule Take 1 capsule (100 mg total) by mouth 2 (two) times daily. 07/11/17   Ashley Murrain, NP  oxyCODONE-acetaminophen (PERCOCET/ROXICET) 5-325 MG tablet Take 1-2 tablets  by mouth every 4 (four) hours as needed for moderate pain or severe pain. 09/24/16   Cherylann Ratel, PA-C  pantoprazole (PROTONIX) 40 MG tablet Take 40 mg by mouth daily. 11/05/15   [provider]    Family History Family History  Problem Relation Age of Onset  . Heart failure Mother   . Diabetes Mother   . Hypertension Mother   . Heart failure Father   . Prostate cancer Father   . Colon cancer Neg Hx     Social History Social History   Tobacco Use  . Smoking status: Former Smoker    Packs/day: 0.50    Years: 25.00    Pack years: 12.50    Types: Cigarettes, E-cigarettes    Last attempt to quit: 11/03/2016    Years since quitting: 0.6  . Smokeless tobacco: Never Used  Substance Use Topics  . Alcohol use: Yes    Alcohol/week: 3.6 oz    Types: 6 Cans of beer per week  . Drug use: No    Comment: states quit     Allergies   Patient has no known allergies.   Review of Systems Review of Systems  Musculoskeletal: Positive for arthralgias.       Knee pain right  Skin: Positive for wound.  All other systems reviewed and are negative.    Physical Exam Updated Vital Signs BP (!) 156/90 (BP Location: Right Arm)   Pulse 90   Temp 98.2 F (36.8 C) (Oral)   Resp 18   Ht 6' (1.829 m)   Wt 90.7 kg (200 lb)   SpO2 98%   BMI 27.12 kg/m   Physical Exam  Constitutional: He appears well-developed and well-nourished. No distress.  HENT:  Head: Normocephalic.  Eyes: EOM are normal.  Neck: Neck supple.  Cardiovascular: Normal rate.  Pulmonary/Chest: Effort normal.  Musculoskeletal:       Right knee: He exhibits swelling. He exhibits no deformity, no laceration and no erythema. Decreased range of motion: due to pain and swelling. Tenderness found.  Slightly increased warmth to the right knee. Puncture wound noted to the lateral aspect.   Neurological: He is alert.  Skin: Skin is warm and dry.  Psychiatric: He has a normal mood and affect.  Nursing note and  vitals reviewed.    ED Treatments / Results  Labs (all labs ordered are listed, but only abnormal results are displayed) Labs Reviewed - No data to display  Radiology Dg Knee Complete 4 Views Right  Result Date: 07/11/2017 CLINICAL DATA:  Puncture wound to the right knee yesterday EXAM: RIGHT KNEE - COMPLETE 4+ VIEW COMPARISON:  None. FINDINGS: No fracture. Trace suprapatellar right knee joint effusion. No dislocation. No suspicious focal osseous lesion. No significant degenerative arthropathy. No joint or cortical erosions. No radiopaque foreign body. IMPRESSION: Trace suprapatellar right knee joint effusion. No fracture, malalignment or osseous erosions. No radiopaque foreign body. Electronically Signed   By: Ilona Sorrel M.D.   On: 07/11/2017 15:24    Procedures  Procedures (including critical care time)  Medications Ordered in ED Medications  doxycycline (VIBRA-TABS) tablet 100 mg (has no administration in time range)   I discussed this case with Dr. Lacinda Axon.   Initial Impression / Assessment and Plan / ED Course  I have reviewed the triage vital signs and the nursing notes. 47 y.o. male here with pain and swelling to the right knee and puncture wound from wire yesterday stable for d/c without fever and does not appear toxic. Hx of chronic pain and problems with the right knee. Knee sleeve applied, will start antibiotics and have patient f/u with PCP.He will return for worsening symptoms.   Final Clinical Impressions(s) / ED Diagnoses   Final diagnoses:  Puncture wound of right knee, initial encounter  Knee effusion, right    ED Discharge Orders        Ordered    doxycycline (VIBRAMYCIN) 100 MG capsule  2 times daily     07/11/17 1641       Janit Bern [place], [place] 07/11/17 1650    Nat Christen, MD 07/12/17 1124

## 2017-07-11 NOTE — ED Triage Notes (Signed)
Pt states got a piece of barbed wire stuck in leg yesterday. Has had pain and increased swelling since. Tetanus UTD.

## 2017-07-19 ENCOUNTER — Ambulatory Visit (INDEPENDENT_AMBULATORY_CARE_PROVIDER_SITE_OTHER): Payer: Managed Care, Other (non HMO) | Admitting: Physician Assistant

## 2017-07-19 ENCOUNTER — Encounter: Payer: Self-pay | Admitting: Physician Assistant

## 2017-07-19 VITALS — BP 143/79 | HR 88 | Resp 15 | Ht 72.0 in | Wt 204.0 lb

## 2017-07-19 DIAGNOSIS — M5136 Other intervertebral disc degeneration, lumbar region: Secondary | ICD-10-CM

## 2017-07-19 DIAGNOSIS — L409 Psoriasis, unspecified: Secondary | ICD-10-CM | POA: Diagnosis not present

## 2017-07-19 DIAGNOSIS — G5712 Meralgia paresthetica, left lower limb: Secondary | ICD-10-CM

## 2017-07-19 DIAGNOSIS — Z79899 Other long term (current) drug therapy: Secondary | ICD-10-CM | POA: Diagnosis not present

## 2017-07-19 DIAGNOSIS — F5101 Primary insomnia: Secondary | ICD-10-CM

## 2017-07-19 DIAGNOSIS — G5711 Meralgia paresthetica, right lower limb: Secondary | ICD-10-CM

## 2017-07-19 DIAGNOSIS — Z8719 Personal history of other diseases of the digestive system: Secondary | ICD-10-CM | POA: Diagnosis not present

## 2017-07-19 DIAGNOSIS — L4050 Arthropathic psoriasis, unspecified: Secondary | ICD-10-CM | POA: Diagnosis not present

## 2017-07-19 DIAGNOSIS — M461 Sacroiliitis, not elsewhere classified: Secondary | ICD-10-CM

## 2017-07-19 DIAGNOSIS — Z96642 Presence of left artificial hip joint: Secondary | ICD-10-CM | POA: Diagnosis not present

## 2017-07-19 DIAGNOSIS — M5030 Other cervical disc degeneration, unspecified cervical region: Secondary | ICD-10-CM | POA: Diagnosis not present

## 2017-07-19 DIAGNOSIS — Z8709 Personal history of other diseases of the respiratory system: Secondary | ICD-10-CM | POA: Diagnosis not present

## 2017-07-19 LAB — COMPLETE METABOLIC PANEL WITH GFR
AG Ratio: 2.1 (calc) (ref 1.0–2.5)
ALBUMIN MSPROF: 4.6 g/dL (ref 3.6–5.1)
ALKALINE PHOSPHATASE (APISO): 50 U/L (ref 40–115)
ALT: 13 U/L (ref 9–46)
AST: 17 U/L (ref 10–40)
BILIRUBIN TOTAL: 0.6 mg/dL (ref 0.2–1.2)
BUN: 18 mg/dL (ref 7–25)
CHLORIDE: 107 mmol/L (ref 98–110)
CO2: 26 mmol/L (ref 20–32)
CREATININE: 0.88 mg/dL (ref 0.60–1.35)
Calcium: 9.5 mg/dL (ref 8.6–10.3)
GFR, Est African American: 120 mL/min/{1.73_m2} (ref >=60)
GFR, Est Non African American: 104 mL/min/{1.73_m2} (ref >=60)
GLUCOSE: 108 mg/dL — AB (ref 65–99)
Globulin: 2.2 g/dL (calc) (ref 1.9–3.7)
Potassium: 4.3 mmol/L (ref 3.5–5.3)
Sodium: 141 mmol/L (ref 135–146)
Total Protein: 6.8 g/dL (ref 6.1–8.1)

## 2017-07-19 LAB — CBC WITH DIFFERENTIAL/PLATELET
BASOS ABS: 38 {cells}/uL (ref 0–200)
Basophils Relative: 0.6 %
EOS PCT: 2.7 %
Eosinophils Absolute: 170 cells/uL (ref 15–500)
HCT: 41.7 % (ref 38.5–50.0)
HEMOGLOBIN: 14.5 g/dL (ref 13.2–17.1)
Lymphs Abs: 2281 cells/uL (ref 850–3900)
MCH: 32.5 pg (ref 27.0–33.0)
MCHC: 34.8 g/dL (ref 32.0–36.0)
MCV: 93.5 fL (ref 80.0–100.0)
MONOS PCT: 8.5 %
MPV: 12.4 fL (ref 7.5–12.5)
NEUTROS ABS: 3276 {cells}/uL (ref 1500–7800)
NEUTROS PCT: 52 %
PLATELETS: 171 10*3/uL (ref 140–400)
RBC: 4.46 10*6/uL (ref 4.20–5.80)
RDW: 12.2 % (ref 11.0–15.0)
TOTAL LYMPHOCYTE: 36.2 %
WBC mixed population: 536 cells/uL (ref 200–950)
WBC: 6.3 10*3/uL (ref 3.8–10.8)

## 2017-07-19 MED ORDER — CELECOXIB 200 MG PO CAPS: 200.0000 mg | ORAL_CAPSULE | Freq: Two times a day (BID) | ORAL | 2 refills | Status: DC

## 2017-07-20 NOTE — Progress Notes (Signed)
Glucose is 108.  All other labs are WNL.

## 2017-08-17 ENCOUNTER — Encounter: Payer: Managed Care, Other (non HMO) | Admitting: Internal Medicine

## 2017-08-17 ENCOUNTER — Encounter

## 2017-08-31 ENCOUNTER — Encounter: Payer: Managed Care, Other (non HMO) | Admitting: Internal Medicine

## 2017-10-27 ENCOUNTER — Other Ambulatory Visit: Payer: Self-pay | Admitting: Rheumatology

## 2017-10-27 NOTE — Telephone Encounter (Signed)
Last Visit: 07/19/17 Next Visit: 12/20/17 Labs: 07/19/17 Glucose is 108. All other labs are WNL.  Tb gold: 01/18/17 Neg   Patient advised he is due for labs. Patient will update labs 10/28/17  Okay to refill 30 day supply per Dr. Estanislado Pandy

## 2017-10-28 ENCOUNTER — Other Ambulatory Visit: Payer: Self-pay

## 2017-10-28 DIAGNOSIS — Z79899 Other long term (current) drug therapy: Secondary | ICD-10-CM

## 2017-10-29 LAB — CBC WITH DIFFERENTIAL/PLATELET
BASOS ABS: 29 {cells}/uL (ref 0–200)
Basophils Relative: 0.4 %
EOS ABS: 194 {cells}/uL (ref 15–500)
Eosinophils Relative: 2.7 %
HEMATOCRIT: 41.1 % (ref 38.5–50.0)
Hemoglobin: 14.4 g/dL (ref 13.2–17.1)
LYMPHS ABS: 2563 {cells}/uL (ref 850–3900)
MCH: 32.4 pg (ref 27.0–33.0)
MCHC: 35 g/dL (ref 32.0–36.0)
MCV: 92.4 fL (ref 80.0–100.0)
MPV: 12.4 fL (ref 7.5–12.5)
Monocytes Relative: 7.3 %
NEUTROS PCT: 54 %
Neutro Abs: 3888 cells/uL (ref 1500–7800)
PLATELETS: 171 10*3/uL (ref 140–400)
RBC: 4.45 10*6/uL (ref 4.20–5.80)
RDW: 12.3 % (ref 11.0–15.0)
TOTAL LYMPHOCYTE: 35.6 %
WBC: 7.2 10*3/uL (ref 3.8–10.8)
WBCMIX: 526 {cells}/uL (ref 200–950)

## 2017-10-29 LAB — COMPLETE METABOLIC PANEL WITH GFR
AG RATIO: 2 (calc) (ref 1.0–2.5)
ALT: 14 U/L (ref 9–46)
AST: 17 U/L (ref 10–40)
Albumin: 4.6 g/dL (ref 3.6–5.1)
Alkaline phosphatase (APISO): 46 U/L (ref 40–115)
BUN: 15 mg/dL (ref 7–25)
CO2: 25 mmol/L (ref 20–32)
Calcium: 9.7 mg/dL (ref 8.6–10.3)
Chloride: 105 mmol/L (ref 98–110)
Creat: 1.13 mg/dL (ref 0.60–1.35)
GFR, EST AFRICAN AMERICAN: 90 mL/min/{1.73_m2} (ref >=60)
GFR, EST NON AFRICAN AMERICAN: 78 mL/min/{1.73_m2} (ref >=60)
GLUCOSE: 95 mg/dL (ref 65–99)
Globulin: 2.3 g/dL (calc) (ref 1.9–3.7)
POTASSIUM: 4.2 mmol/L (ref 3.5–5.3)
Sodium: 138 mmol/L (ref 135–146)
Total Bilirubin: 0.5 mg/dL (ref 0.2–1.2)
Total Protein: 6.9 g/dL (ref 6.1–8.1)

## 2017-11-01 NOTE — Progress Notes (Signed)
Labs are WNL.

## 2017-11-24 ENCOUNTER — Other Ambulatory Visit: Payer: Self-pay | Admitting: *Deleted

## 2017-11-24 MED ORDER — SECUKINUMAB (300 MG DOSE) 150 MG/ML ~~LOC~~ SOAJ: 300.0000 mg | SUBCUTANEOUS | 0 refills | Status: DC

## 2017-11-24 NOTE — Telephone Encounter (Signed)
Refill request received via fax  Last Visit: 07/19/17 Next Visit: 12/20/17 Labs: 1024/19 WNL Tb gold: 01/18/17 Neg   Okay to refill per Dr. Estanislado Pandy

## 2017-11-30 ENCOUNTER — Other Ambulatory Visit: Payer: Self-pay | Admitting: *Deleted

## 2017-11-30 MED ORDER — SECUKINUMAB (300 MG DOSE) 150 MG/ML ~~LOC~~ SOAJ: 300.0000 mg | SUBCUTANEOUS | 0 refills | Status: DC

## 2017-11-30 NOTE — Telephone Encounter (Signed)
Refill request received via fax  Last Visit: 07/19/17 Next Visit: 12/20/17 Labs: 10/28/17 WNL Tb gold: 01/18/17 Neg   Okay to refill per Dr. Estanislado Pandy

## 2017-12-06 NOTE — Progress Notes (Deleted)
Office Visit Note  Patient: Charles [name]             Date of Birth: 09-Jun-1970           MRN: [id number]             PCP: Celene Squibb, MD Referring: Celene Squibb, MD Visit Date: 12/20/2017 Occupation: @GUAROCC @  Subjective:  No chief complaint on file.  Cosentyx 300 mg every 28 days.  Last TB: Negative on 01/18/2017.  Most recent CBC/CMP within normal limits on 10/28/2017.  Next CBC/CMP due in January and then every 3 months.  Standing orders are in place.  Due for TB Gold in January and future order placed.  Recommend annual flu and Prevnar 13 vaccine as indicated.  History of Present Illness: LEXTON HIDALGO is a 47 y.o. male with history of psoriatic arthritis and DDD.  Activities of Daily Living:  Patient reports morning stiffness for *** {minute/hour:19697}.   Patient {ACTIONS;DENIES/REPORTS:21021675::"Denies"} nocturnal pain.  Difficulty dressing/grooming: {ACTIONS;DENIES/REPORTS:21021675::"Denies"} Difficulty climbing stairs: {ACTIONS;DENIES/REPORTS:21021675::"Denies"} Difficulty getting out of chair: {ACTIONS;DENIES/REPORTS:21021675::"Denies"} Difficulty using hands for taps, buttons, cutlery, and/or writing: {ACTIONS;DENIES/REPORTS:21021675::"Denies"}  No Rheumatology ROS completed.   PMFS History:  Patient Active Problem List   Diagnosis Date Noted  . Constipation 05/11/2017  . Chronic right-sided low back pain with right-sided sciatica 04/05/2017  . 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 Bilateral sacroiliitis ([place]) 05/22/2016  . Psoriatic arthritis ([place]) 05/22/2016  . High risk medications (not anticoagulants) long-term use 05/22/2016  . DJD (degenerative joint disease), cervical 05/22/2016  . Osteoarthritis of spine with radiculopathy, lumbar region 05/22/2016  . Cephalalgia 03/16/2016  . Chronic daily headache 03/16/2016  . Neck pain 03/16/2016  . Hemorrhoids 03/13/2016  . 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  Insomnia 12/26/2015  . Abdominal pain, epigastric 11/13/2015  . Dark stools 11/13/2015  . Rectal bleeding 11/13/2015  . Numbness 09/26/2015  . 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 Avascular necrosis of left femoral head ([place]) 07/30/2015  . Avascular necrosis of right femoral head ([place]) 07/30/2015  . Psoriasis 07/30/2015  . Chronic obstructive pulmonary disease (COPD) ([place]) 07/30/2015  . 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 Arthritis    also has avascular necrosis   . Chronic back pain   . Chronic neck pain   . Chronic shoulder pain   . COPD (chronic obstructive pulmonary disease) ([place])   . GERD (gastroesophageal reflux disease)    tums  OTC  medication  . Headache   . Hypothyroidism   . Sleep apnea    tested more than 5 yrs ago...negative results    Family History  Problem Relation Age of Onset  . Heart failure Mother   . Diabetes Mother   . Hypertension Mother   . Heart failure Father   . Prostate cancer Father   . Healthy Daughter   . Colon cancer Neg Hx    Past Surgical History:  Procedure Laterality Date  . COLONOSCOPY WITH PROPOFOL N/A 12/09/2015   Procedure: COLONOSCOPY WITH PROPOFOL;  Surgeon: Daneil Dolin, MD;  [facility];  Service: Endoscopy;  Laterality: N/A;  1100  . ESOPHAGOGASTRODUODENOSCOPY (EGD) WITH PROPOFOL N/A 12/09/2015   Procedure: ESOPHAGOGASTRODUODENOSCOPY (EGD) WITH PROPOFOL;  Surgeon: Daneil Dolin, MD;  [facility];  Service: Endoscopy;  Laterality: N/A;  . JOINT REPLACEMENT  left hip 2017  . NECK SURGERY    . SHOULDER SURGERY    . TOTAL HIP ARTHROPLASTY Left 07/30/2015   Procedure: TOTAL HIP ARTHROPLASTY;  Surgeon: Garald Balding, MD;  Location: [place];  Service: Orthopedics;  Laterality: Left;  . TOTAL HIP ARTHROPLASTY Right 09/22/2016  . TOTAL HIP ARTHROPLASTY Right 09/22/2016   Procedure: RIGHT TOTAL HIP  ARTHROPLASTY;  Surgeon: Garald Balding, MD;  Location: [place];  Service: Orthopedics;  Laterality: Right;   Social History   Social History Narrative  . Not on file    Objective: Vital Signs: There were no vitals taken for this visit.   Physical Exam   Musculoskeletal Exam: ***  CDAI Exam: CDAI Score: Not documented Patient Global Assessment: Not documented; Provider Global Assessment: Not documented Swollen: Not documented; Tender: Not documented Joint Exam   Not documented   There is currently no information documented on the homunculus. Go to the Rheumatology activity and complete the homunculus joint exam.  Investigation: No additional findings.  Imaging: No results found.  Recent Labs: Lab Results  Component Value Date   WBC 7.2 10/28/2017   HGB 14.4 10/28/2017   PLT 171 10/28/2017   NA 138 10/28/2017   K 4.2 10/28/2017   CL 105 10/28/2017   CO2 25 10/28/2017   GLUCOSE 95 10/28/2017   BUN 15 10/28/2017   CREATININE 1.13 10/28/2017   BILITOT 0.5 10/28/2017   ALKPHOS 44 09/10/2016   AST 17 10/28/2017   ALT 14 10/28/2017   PROT 6.9 10/28/2017   ALBUMIN 4.4 09/10/2016   CALCIUM 9.7 10/28/2017   GFRAA 90 10/28/2017   QFTBGOLDPLUS NEGATIVE 01/18/2017    Speciality Comments: No specialty comments available.  Procedures:  No procedures performed Allergies: Patient has no known allergies.   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  No orders of the defined types were placed in this encounter.   Face-to-face time spent with patient was *** minutes. Greater than 50% of time was spent in counseling and coordination of care.  Follow-Up Instructions: No follow-ups on file.   Ofilia Neas, PA-C  Note - This record has been created using Dragon software.  Chart creation errors have been sought, but may not always  have been located. Such creation errors do not reflect on  the standard of medical care.

## 2017-12-16 NOTE — Progress Notes (Deleted)
Office Visit Note  Patient: Charles [name]             Date of Birth: [birth year]           MRN: [id number]             PCP: Celene Squibb, MD Referring: Celene Squibb, MD Visit Date: 12/30/2017 Occupation: @GUAROCC @  Subjective:  No chief complaint on file.  Cosentyx 300 mg monthly.  Last TB gold negative on 01/18/17.  Future order in place.  Most recent CBC/CMP within normal limits on 10/28/17.  Next CBC/CMP due in January and monitor every 3 months. Standing orders are in place.  Received Pneumovax 23 in 2018.  Recommend annual flu and Prevnar 13 as indicated.   History of Present Illness: Charles [name] is a 47 y.o. male with history of psoriatic arthritis and DDD.     Activities of Daily Living:  Patient reports morning stiffness for *** {minute/hour:19697}.   Patient {ACTIONS;DENIES/REPORTS:21021675::"Denies"} nocturnal pain.  Difficulty dressing/grooming: {ACTIONS;DENIES/REPORTS:21021675::"Denies"} Difficulty climbing stairs: {ACTIONS;DENIES/REPORTS:21021675::"Denies"} Difficulty getting out of chair: {ACTIONS;DENIES/REPORTS:21021675::"Denies"} Difficulty using hands for taps, buttons, cutlery, and/or writing: {ACTIONS;DENIES/REPORTS:21021675::"Denies"}  No Rheumatology ROS completed.   PMFS History:  Patient Active Problem List   Diagnosis Date Noted  . Constipation 05/11/2017  . Chronic right-sided low back pain with right-sided sciatica 04/05/2017  . 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 Bilateral sacroiliitis ([place]) 05/22/2016  . Psoriatic arthritis ([place]) 05/22/2016  . High risk medications (not anticoagulants) long-term use 05/22/2016  . DJD (degenerative joint disease), cervical 05/22/2016  . Osteoarthritis of spine with radiculopathy, lumbar region 05/22/2016  . Cephalalgia 03/16/2016  . Chronic daily headache 03/16/2016  . Neck pain 03/16/2016  . Hemorrhoids 03/13/2016  . 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  Insomnia 12/26/2015  . Abdominal pain, epigastric 11/13/2015  . Dark stools 11/13/2015  . Rectal bleeding 11/13/2015  . Numbness 09/26/2015  . 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 Avascular necrosis of left femoral head ([place]) 07/30/2015  . Avascular necrosis of right femoral head ([place]) 07/30/2015  . Psoriasis 07/30/2015  . Chronic obstructive pulmonary disease (COPD) ([place]) 07/30/2015  . 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 Arthritis    also has avascular necrosis   . Chronic back pain   . Chronic neck pain   . Chronic shoulder pain   . COPD (chronic obstructive pulmonary disease) ([place])   . GERD (gastroesophageal reflux disease)    tums  OTC  medication  . Headache   . Hypothyroidism   . Sleep apnea    tested more than 5 yrs ago...negative results    Family History  Problem Relation Age of Onset  . Heart failure Mother   . Diabetes Mother   . Hypertension Mother   . Heart failure Father   . Prostate cancer Father   . Healthy Daughter   . Colon cancer Neg Hx    Past Surgical History:  Procedure Laterality Date  . COLONOSCOPY WITH PROPOFOL N/A 12/09/2015   Procedure: COLONOSCOPY WITH PROPOFOL;  Surgeon: Daneil Dolin, MD;  [facility];  Service: Endoscopy;  Laterality: N/A;  1100  . ESOPHAGOGASTRODUODENOSCOPY (EGD) WITH PROPOFOL N/A 12/09/2015   Procedure: ESOPHAGOGASTRODUODENOSCOPY (EGD) WITH PROPOFOL;  Surgeon: Daneil Dolin, MD;  [facility];  Service: Endoscopy;  Laterality: N/A;  . JOINT REPLACEMENT  left hip 2017  . NECK SURGERY    . SHOULDER SURGERY    . TOTAL HIP ARTHROPLASTY Left 07/30/2015   Procedure: TOTAL HIP ARTHROPLASTY;  Surgeon: Garald Balding, MD;  Location: [place];  Service: Orthopedics;  Laterality: Left;  . TOTAL HIP ARTHROPLASTY Right 09/22/2016  . TOTAL HIP ARTHROPLASTY Right 09/22/2016   Procedure: RIGHT TOTAL HIP  ARTHROPLASTY;  Surgeon: Garald Balding, MD;  Location: [place];  Service: Orthopedics;  Laterality: Right;   Social History   Social History Narrative  . Not on file   Immunization History  Administered Date(s) Administered  . Pneumococcal Polysaccharide-23 09/23/2016  . Tdap 04/16/2016    Objective: Vital Signs: There were no vitals taken for this visit.   Physical Exam   Musculoskeletal Exam: ***  CDAI Exam: CDAI Score: Not documented Patient Global Assessment: Not documented; Provider Global Assessment: Not documented Swollen: Not documented; Tender: Not documented Joint Exam   Not documented   There is currently no information documented on the homunculus. Go to the Rheumatology activity and complete the homunculus joint exam.  Investigation: No additional findings.  Imaging: No results found.  Recent Labs: Lab Results  Component Value Date   WBC 7.2 10/28/2017   HGB 14.4 10/28/2017   PLT 171 10/28/2017   NA 138 10/28/2017   K 4.2 10/28/2017   CL 105 10/28/2017   CO2 25 10/28/2017   GLUCOSE 95 10/28/2017   BUN 15 10/28/2017   CREATININE 1.13 10/28/2017   BILITOT 0.5 10/28/2017   ALKPHOS 44 09/10/2016   AST 17 10/28/2017   ALT 14 10/28/2017   PROT 6.9 10/28/2017   ALBUMIN 4.4 09/10/2016   CALCIUM 9.7 10/28/2017   GFRAA 90 10/28/2017   QFTBGOLDPLUS NEGATIVE 01/18/2017    Speciality Comments: No specialty comments available.  Procedures:  No procedures performed Allergies: Patient has no known allergies.   Assessment / Plan:     Visit Diagnoses: No diagnosis found.   Orders: No orders of the defined types were placed in this encounter.  No orders of the defined types were placed in this encounter.   Face-to-face time spent with patient was *** minutes. Greater than 50% of time was spent in counseling and coordination of care.  Follow-Up Instructions: No follow-ups on file.   Earnestine Mealing, CMA  Note - This record has been created  using Editor, commissioning.  Chart creation errors have been sought, but may not always  have been located. Such creation errors do not reflect on  the standard of medical care.

## 2017-12-20 ENCOUNTER — Ambulatory Visit: Payer: Managed Care, Other (non HMO) | Admitting: Physician Assistant

## 2017-12-21 ENCOUNTER — Other Ambulatory Visit (HOSPITAL_COMMUNITY): Payer: Self-pay | Admitting: Internal Medicine

## 2017-12-21 ENCOUNTER — Ambulatory Visit (HOSPITAL_COMMUNITY)
Admission: RE | Admit: 2017-12-21 | Discharge: 2017-12-21 | Disposition: A | Discharge status: Home / Self Care | Payer: Managed Care, Other (non HMO) | Source: Ambulatory Visit | Attending: Internal Medicine | Admitting: Internal Medicine

## 2017-12-21 DIAGNOSIS — R0989 Other specified symptoms and signs involving the circulatory and respiratory systems: Secondary | ICD-10-CM

## 2017-12-21 DIAGNOSIS — R05 Cough: Secondary | ICD-10-CM

## 2017-12-21 DIAGNOSIS — R059 Cough, unspecified: Secondary | ICD-10-CM

## 2017-12-30 ENCOUNTER — Ambulatory Visit: Payer: Managed Care, Other (non HMO) | Admitting: Rheumatology

## 2018-02-01 ENCOUNTER — Ambulatory Visit (INDEPENDENT_AMBULATORY_CARE_PROVIDER_SITE_OTHER): Payer: Self-pay

## 2018-02-01 ENCOUNTER — Ambulatory Visit (INDEPENDENT_AMBULATORY_CARE_PROVIDER_SITE_OTHER): Payer: Managed Care, Other (non HMO)

## 2018-02-01 ENCOUNTER — Encounter (INDEPENDENT_AMBULATORY_CARE_PROVIDER_SITE_OTHER): Payer: Self-pay | Admitting: Orthopaedic Surgery

## 2018-02-01 ENCOUNTER — Ambulatory Visit (INDEPENDENT_AMBULATORY_CARE_PROVIDER_SITE_OTHER): Payer: Managed Care, Other (non HMO) | Admitting: Orthopaedic Surgery

## 2018-02-01 VITALS — BP 135/84 | HR 79 | Resp 14 | Ht 72.0 in | Wt 205.0 lb

## 2018-02-01 DIAGNOSIS — M25552 Pain in left hip: Secondary | ICD-10-CM | POA: Diagnosis not present

## 2018-02-01 DIAGNOSIS — M5442 Lumbago with sciatica, left side: Secondary | ICD-10-CM

## 2018-02-01 MED ORDER — GABAPENTIN 300 MG PO CAPS: ORAL_CAPSULE | ORAL | 1 refills | Status: DC

## 2018-02-01 NOTE — Progress Notes (Signed)
Office Visit Note   Patient: Charles [name]           Date of Birth: Nov 07, 1970           MRN: [id number] Visit Date: 02/01/2018              Requested by: Celene Squibb, MD [place], [place] [address] PCP: Celene Squibb, MD   Assessment & Plan: Visit Diagnoses:  1. Acute left-sided low back pain with left-sided sciatica   2. Pain in left hip     Plan: Back buttock and left leg pain appear to be related to the back rather than the left total hip replacement.  Films reveal excellent position of the left hip.  Has had prior MRI scan of the lumbar spine about a year ago revealing possible foraminal stenosis.  I think that is more responsible for his present pain.  Also experiencing burning in his left ankle and foot.  Would like to schedule an epidural steroid injection and gabapentin for the burning pain.  Check back in 3 to 4 weeks  Follow-Up Instructions: Return in about 1 month (around 03/04/2018).   Orders:  Orders Placed This Encounter  Procedures  . XR Lumbar Spine 2-3 Views  . XR Pelvis 1-2 Views  . Epidural Steroid Injection - Lumbar/Sacral (Ancillary Performed)   Meds ordered this encounter  Medications  . gabapentin (NEURONTIN) 300 MG capsule    Sig: Take 1 tablet at bedtime    Dispense:  30 capsule    Refill:  1      Procedures: No procedures performed   Clinical Data: No additional findings.   Subjective: Chief Complaint  Patient presents with  . Left Hip - Pain  . Left Leg - Pain  . Lower Back - Pain  . Hip Pain    Left hip pain  . Leg Pain    Left leg pain  . Back Pain    Low back pain    HPI   Mr. Zacharia is a 48 year old male who presents with left hip and left leg pain. He states the pain has been for 3 days with no injury. Work is physical and demanding. Numbness, weakness, burning and tingling from hip left hip to the top of his left foot. Difficulty walking and sleeping at night. He has difficulty weight bearing and  sitting.He is taking oxycodone for the pain which does not help.There is no swelling, popping, clicking or grinding noises. He is limping.Not diabetic, left hip replacement 07/2015 - Dr. Durward Fortes. No fever. He is also having low back pain and burning.  Review of Systems  Constitutional: Positive for fatigue.  HENT: Negative for trouble swallowing.   Eyes: Negative for pain.  Respiratory: Negative for shortness of breath.   Cardiovascular: Negative for leg swelling.  Gastrointestinal: Negative for constipation.  Endocrine: Positive for cold intolerance and heat intolerance.  Genitourinary: Negative for difficulty urinating.  Musculoskeletal: Positive for gait problem.  Skin: Negative for rash.  Allergic/Immunologic: Negative for food allergies.  Neurological: Positive for weakness and numbness.  Hematological: Does not bruise/bleed easily.  Psychiatric/Behavioral: Positive for sleep disturbance.     Objective: Vital Signs: BP 135/84 (BP Location: Right Arm, Patient Position: Sitting, Cuff Size: Normal)   Pulse 79   Resp 14   Ht 6' (1.829 m)   Wt 205 lb (93 kg)   BMI 27.80 kg/m   Physical Exam Constitutional:      Appearance:  He is well-developed.  Eyes:     Pupils: Pupils are equal, round, and reactive to light.  Pulmonary:     Effort: Pulmonary effort is normal.  Skin:    General: Skin is warm and dry.  Neurological:     Mental Status: He is alert and oriented to person, place, and time.  Psychiatric:        Behavior: Behavior normal.     Ortho Exam awake and alert and oriented x3.  Comfortable sitting painless range of motion left hip with any motion.  Straight leg raise was minimally positive on the left at about 80 degrees and negative on the right.  Reflexes were symmetrical.  Has subjective burning from the distal third of the left leg through the dorsum of his foot.  Motor exam appears to be intact.  No percussible tenderness of the lumbar spine  Specialty  Comments:  No specialty comments available.  Imaging: Xr Lumbar Spine 2-3 Views  Result Date: 02/01/2018 Films of lumbar spine were obtained in several projections.  There is degenerative disc disease at L2-3 L3-4 with some narrowing of the disc space.  There are also anterior osteophytes at the same levels.  Some compression of the superior endplate of L4 but without history of injury or trauma.  No listhesis.  No curvature on the AP.  Some mild sclerosis of the sacroiliac joints    PMFS History: Patient Active Problem List   Diagnosis Date Noted  . Acute left-sided low back pain with left-sided sciatica 02/01/2018  . Pain in left hip 02/01/2018  . Constipation 05/11/2017  . Chronic right-sided low back pain with right-sided sciatica 04/05/2017  . 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 Bilateral sacroiliitis ([place]) 05/22/2016  . Psoriatic arthritis ([place]) 05/22/2016  . High risk medications (not anticoagulants) long-term use 05/22/2016  . DJD (degenerative joint disease), cervical 05/22/2016  . Osteoarthritis of spine with radiculopathy, lumbar region 05/22/2016  . Cephalalgia 03/16/2016  . Chronic daily headache 03/16/2016  . Neck pain 03/16/2016  . Hemorrhoids 03/13/2016  . 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 Insomnia 12/26/2015  . Abdominal pain, epigastric 11/13/2015  . Dark stools 11/13/2015  . Rectal bleeding 11/13/2015  . Numbness 09/26/2015  . 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 Avascular necrosis of left femoral head ([place]) 07/30/2015  . Avascular necrosis of right femoral head ([place]) 07/30/2015  . Psoriasis 07/30/2015  . Chronic obstructive pulmonary disease (COPD) ([place]) 07/30/2015  . 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   Past Medical History:  Diagnosis Date  . Arthritis    also has avascular necrosis   . Chronic back pain   . Chronic neck pain   . Chronic  shoulder pain   . COPD (chronic obstructive pulmonary disease) ([place])   . GERD (gastroesophageal reflux disease)    tums  OTC  medication  . Headache   . Hypothyroidism   . Sleep apnea    tested more than 5 yrs ago...negative results    Family History  Problem Relation Age of Onset  . Heart failure Mother   . Diabetes Mother   . Hypertension Mother   . Heart failure Father   . Prostate cancer Father   . Healthy Daughter   . Colon cancer Neg Hx     Past Surgical History:  Procedure Laterality Date  . COLONOSCOPY WITH PROPOFOL N/A 12/09/2015   Procedure: COLONOSCOPY WITH PROPOFOL;  Surgeon: Daneil Dolin, MD;  [facility];  Service: Endoscopy;  Laterality: N/A;  1100  . ESOPHAGOGASTRODUODENOSCOPY (EGD) WITH PROPOFOL N/A 12/09/2015   Procedure: ESOPHAGOGASTRODUODENOSCOPY (EGD) WITH PROPOFOL;  Surgeon: Daneil Dolin, MD;  [facility];  Service: Endoscopy;  Laterality: N/A;  . JOINT REPLACEMENT     left hip 2017  . NECK SURGERY    . SHOULDER SURGERY    . TOTAL HIP ARTHROPLASTY Left 07/30/2015   Procedure: TOTAL HIP ARTHROPLASTY;  Surgeon: Garald Balding, MD;  Location: [facility];  Service: Orthopedics;  Laterality: Left;  . TOTAL HIP ARTHROPLASTY Right 09/22/2016  . TOTAL HIP ARTHROPLASTY Right 09/22/2016   Procedure: RIGHT TOTAL HIP ARTHROPLASTY;  Surgeon: Garald Balding, MD;  Location: [facility];  Service: Orthopedics;  Laterality: Right;   Social History   Occupational History  . Not on file  Tobacco Use  . Smoking status: Current Every Day Smoker    Packs/day: 0.50    Years: 25.00    Pack years: 12.50    Types: Cigarettes  . Smokeless tobacco: Never Used  Substance and Sexual Activity  . Alcohol use: Yes    Alcohol/week: 7.0 standard drinks    Types: 7 Cans of beer per week  . Drug use: No    Comment: states quit  . Sexual activity: Not on file

## 2018-02-07 ENCOUNTER — Ambulatory Visit
Admission: RE | Admit: 2018-02-07 | Discharge: 2018-02-07 | Disposition: A | Discharge status: Home / Self Care | Payer: Managed Care, Other (non HMO) | Source: Ambulatory Visit | Attending: Orthopaedic Surgery | Admitting: Orthopaedic Surgery

## 2018-02-07 DIAGNOSIS — M5442 Lumbago with sciatica, left side: Secondary | ICD-10-CM

## 2018-02-07 DIAGNOSIS — M25552 Pain in left hip: Secondary | ICD-10-CM

## 2018-02-07 MED ORDER — IOPAMIDOL (ISOVUE-M 200) INJECTION 41%
1.0000 mL | Freq: Once | INTRAMUSCULAR | Status: AC
  Administered 2018-02-07: 1 mL via EPIDURAL

## 2018-02-07 MED ORDER — METHYLPREDNISOLONE ACETATE 40 MG/ML INJ SUSP (RADIOLOG
120.0000 mg | Freq: Once | INTRAMUSCULAR | Status: AC
  Administered 2018-02-07: 120 mg via EPIDURAL

## 2018-02-07 NOTE — Discharge Instructions (Signed)

## 2018-02-16 ENCOUNTER — Other Ambulatory Visit: Payer: Self-pay | Admitting: *Deleted

## 2018-02-16 ENCOUNTER — Telehealth: Payer: Self-pay | Admitting: Pharmacy Technician

## 2018-02-16 NOTE — Progress Notes (Signed)
Office Visit Note  Patient: Charles [name]             Date of Birth: [birth year]           MRN: [id number]             PCP: Celene Squibb, MD Referring: Celene Squibb, MD Visit Date: 02/23/2018 Occupation: @GUAROCC @  Subjective:  Lower back pain   History of Present Illness: Charles [name] is a 48 y.o. male with history of psoriatic arthritis and DDD.  He is on Cosentyx 300 mg sq injections once monthly.   He takes Celebrex and Percocet for pain relief.  He takes Gabapentin 300 mg 1 capsule at bedtime.  He continues to have chronic lower back pain.  He had a spinal epidural injection several weeks ago at [facility]. He reports the injection helped with the left sided radiculopathy but he continues to have lower back pain. He denies any SI joint pain. He will be starting physical therapy next week. He denies any other joint pain or joint swelling.  He denies any psoriasis at this time.  He denies any achilles tendonitis or plantar fasciitis.    Activities of Daily Living:  Patient reports morning stiffness for 20-30 minutes.   Patient Reports nocturnal pain.  Difficulty dressing/grooming: Denies Difficulty climbing stairs: Denies Difficulty getting out of chair: Denies Difficulty using hands for taps, buttons, cutlery, and/or writing: Denies  Review of Systems  Constitutional: Positive for fatigue. Negative for night sweats.  HENT: Negative for mouth sores, mouth dryness and nose dryness.   Eyes: Negative for redness and dryness.  Respiratory: Negative for cough, hemoptysis, shortness of breath and difficulty breathing.   Cardiovascular: Negative for chest pain, palpitations, hypertension, irregular heartbeat and swelling in legs/feet.  Gastrointestinal: Negative for blood in stool, constipation and diarrhea.  Endocrine: Negative for increased urination.  Genitourinary: Negative for painful urination.  Musculoskeletal: Positive for arthralgias, joint pain and morning stiffness.  Negative for joint swelling, myalgias, muscle weakness, muscle tenderness and myalgias.  Skin: Negative for color change, rash, hair loss, nodules/bumps, skin tightness, ulcers and sensitivity to sunlight.  Allergic/Immunologic: Negative for susceptible to infections.  Neurological: Positive for headaches. Negative for dizziness, fainting, memory loss, night sweats and weakness.  Hematological: Negative for swollen glands.  Psychiatric/Behavioral: Negative for depressed mood and sleep disturbance. The patient is nervous/anxious.     PMFS History:  Patient Active Problem List   Diagnosis Date Noted  . Acute left-sided low back pain with left-sided sciatica 02/01/2018  . Pain in left hip 02/01/2018  . Constipation 05/11/2017  . Chronic right-sided low back pain with right-sided sciatica 04/05/2017  . 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 Bilateral sacroiliitis ([place]) 05/22/2016  . Psoriatic arthritis ([place]) 05/22/2016  . High risk medications (not anticoagulants) long-term use 05/22/2016  . DJD (degenerative joint disease), cervical 05/22/2016  . Osteoarthritis of spine with radiculopathy, lumbar region 05/22/2016  . Cephalalgia 03/16/2016  . Chronic daily headache 03/16/2016  . Neck pain 03/16/2016  . Hemorrhoids 03/13/2016  . 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 Insomnia 12/26/2015  . Abdominal pain, epigastric 11/13/2015  . Dark stools 11/13/2015  . Rectal bleeding 11/13/2015  . Numbness 09/26/2015  . 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 Avascular necrosis of left femoral head ([place]) 07/30/2015  . Avascular necrosis of right femoral head ([place]) 07/30/2015  . Psoriasis 07/30/2015  . Chronic obstructive pulmonary  disease (COPD) ([place]) 07/30/2015  . 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 Arthritis    also has avascular necrosis   .  Chronic back pain   . Chronic neck pain   . Chronic shoulder pain   . COPD (chronic obstructive pulmonary disease) ([place])   . GERD (gastroesophageal reflux disease)    tums  OTC  medication  . Headache   . Hypothyroidism   . Sleep apnea    tested more than 5 yrs ago...negative results    Family History  Problem Relation Age of Onset  . Heart failure Mother   . Diabetes Mother   . Hypertension Mother   . Heart failure Father   . Prostate cancer Father   . Arthritis Father   . Hypertension Sister   . Heart attack Sister   . Hypercholesterolemia Sister   . Healthy Daughter   . Colon cancer Neg Hx    Past Surgical History:  Procedure Laterality Date  . COLONOSCOPY WITH PROPOFOL N/A 12/09/2015   Procedure: COLONOSCOPY WITH PROPOFOL;  Surgeon: Daneil Dolin, MD;  [facility];  Service: Endoscopy;  Laterality: N/A;  1100  . ESOPHAGOGASTRODUODENOSCOPY (EGD) WITH PROPOFOL N/A 12/09/2015   Procedure: ESOPHAGOGASTRODUODENOSCOPY (EGD) WITH PROPOFOL;  Surgeon: Daneil Dolin, MD;  [facility];  Service: Endoscopy;  Laterality: N/A;  . JOINT REPLACEMENT     left hip 2017  . NECK SURGERY    . SHOULDER SURGERY    . TOTAL HIP ARTHROPLASTY Left 07/30/2015   Procedure: TOTAL HIP ARTHROPLASTY;  Surgeon: Garald Balding, MD;  Location: [facility];  Service: Orthopedics;  Laterality: Left;  . TOTAL HIP ARTHROPLASTY Right 09/22/2016  . TOTAL HIP ARTHROPLASTY Right 09/22/2016   Procedure: RIGHT TOTAL HIP ARTHROPLASTY;  Surgeon: Garald Balding, MD;  Location: [facility];  Service: Orthopedics;  Laterality: Right;   Social History   Social History Narrative  . Not on file   Immunization History  Administered Date(s) Administered  . Pneumococcal Polysaccharide-23 09/23/2016  . Tdap 04/16/2016     Objective: Vital Signs: BP 126/82 (BP Location: Left Arm, Patient Position: Sitting, Cuff Size: Normal)   Pulse 71   Resp 15   Ht 6' (1.829 m)   Wt 202 lb (91.6 kg)   BMI 27.40  kg/m    Physical Exam Vitals signs and nursing note reviewed.  Constitutional:      Appearance: He is well-developed.  HENT:     Head: Normocephalic and atraumatic.  Eyes:     Conjunctiva/sclera: Conjunctivae normal.     Pupils: Pupils are equal, round, and reactive to light.  Neck:     Musculoskeletal: Normal range of motion and neck supple.  Cardiovascular:     Rate and Rhythm: Normal rate and regular rhythm.     Heart sounds: Normal heart sounds.  Pulmonary:     Effort: Pulmonary effort is normal.     Breath sounds: Normal breath sounds.  Abdominal:     General: Bowel sounds are normal.     Palpations: Abdomen is soft.  Lymphadenopathy:     Cervical: No cervical adenopathy.  Skin:    General: Skin is warm and dry.     Capillary Refill: Capillary refill takes less than 2 seconds.     Comments: Nail pitting in fingernails. No psoriasis   Neurological:     Mental Status: He is alert and oriented to person,  place, and time.  Psychiatric:        Behavior: Behavior normal.      Musculoskeletal Exam: C-spine good ROM.  Limited ROM of lumbar spine.  Midline spinal tenderness in lumbar region. Shoulder joints, elbow joints, wrist joints, MCPs, PIPs, and DIPs good ROM with no synovitis.  Ganglion cysts on radial aspects of both wrist joints. Right hip limited ROM due to trochanteric bursitis.  Left hip replacement good ROM. Knee joints and ankle joints good ROM with no warmth or effusion.    CDAI Exam: CDAI Score: Not documented Patient Global Assessment: Not documented; Provider Global Assessment: Not documented Swollen: Not documented; Tender: Not documented Joint Exam   Not documented   There is currently no information documented on the homunculus. Go to the Rheumatology activity and complete the homunculus joint exam.  Investigation: No additional findings.  Imaging: Epidural Steroid Injection - Lumbar/sacral (ancillary Performed)  Result Date: 02/07/2018 CLINICAL  DATA:  Back pain with left leg pain and numbness. Good response to previous epidural, at which time the pain was worse on the right. FLUOROSCOPY TIME:  0 minutes 18 seconds. 14.16 micro gray meter squared PROCEDURE: The procedure, risks, benefits, and alternatives were explained to the patient. Questions regarding the procedure were encouraged and answered. The patient understands and consents to the procedure. LUMBAR EPIDURAL INJECTION: An interlaminar approach was performed on the left at L4-5. The overlying skin was cleansed and anesthetized. A 20 gauge epidural needle was advanced using loss-of-resistance technique. DIAGNOSTIC EPIDURAL INJECTION: Injection of Isovue-M 200 shows a good epidural pattern with spread above and below the level of needle placement, primarily on the left, but to both sides. No vascular opacification is seen. THERAPEUTIC EPIDURAL INJECTION: One hundred twenty mg of Depo-Medrol mixed with 3 cc 1% lidocaine were instilled. The procedure was well-tolerated, and the patient was discharged thirty minutes following the injection in good condition. COMPLICATIONS: None IMPRESSION: Technically successful epidural injection on the left at L4-5. Electronically Signed   By: Nelson Chimes M.D.   On: 02/07/2018 09:27   Xr Lumbar Spine 2-3 Views  Result Date: 02/01/2018 Films of lumbar spine were obtained in several projections.  There is degenerative disc disease at L2-3 L3-4 with some narrowing of the disc space.  There are also anterior osteophytes at the same levels.  Some compression of the superior endplate of L4 but without history of injury or trauma.  No listhesis.  No curvature on the AP.  Some mild sclerosis of the sacroiliac joints   Recent Labs: Lab Results  Component Value Date   WBC 7.2 10/28/2017   HGB 14.4 10/28/2017   PLT 171 10/28/2017   NA 138 10/28/2017   K 4.2 10/28/2017   CL 105 10/28/2017   CO2 25 10/28/2017   GLUCOSE 95 10/28/2017   BUN 15 10/28/2017    CREATININE 1.13 10/28/2017   BILITOT 0.5 10/28/2017   ALKPHOS 44 09/10/2016   AST 17 10/28/2017   ALT 14 10/28/2017   PROT 6.9 10/28/2017   ALBUMIN 4.4 09/10/2016   CALCIUM 9.7 10/28/2017   GFRAA 90 10/28/2017   QFTBGOLDPLUS NEGATIVE 01/18/2017    Speciality Comments: No specialty comments available.  Procedures:  No procedures performed Allergies: Patient has no known allergies.   Assessment / Plan:     Visit Diagnoses: Psoriatic arthritis ([place]): He has no synovitis or dactylitis on exam.  He is on had any recent psoriatic arthritis flares.  He is clinically doing well on Cosentyx 300 mg  subcutaneous injections every 28 days.  He has chronic midline lower back pain.  He has no SI joint tenderness on exam.  He has no Achilles tenderness or plantar fasciitis.  He has no active psoriasis at this time.  He has no pitting in his fingernails.  He has no other joint pain or joint swelling at this time.  He will continue on his current treatment regimen.  He does not need any refills at this time.  He was advised to notify us if he develops increased joint pain or joint swelling.  He will follow-up in the office in 5 months.  Psoriasis: He has no active psoriasis at this time.  He has no pitting in several fingernails.  High risk medications (not anticoagulants) long-term use - Cosentyx 300 mg every month.  CBC and CMP will be drawn today to monitor for drug toxicity.  He will return in May and every 3 months for lab work.  TB gold will be drawn today.  TB gold was negative on 01/18/2017.- Plan: CBC with Differential/Platelet, COMPLETE METABOLIC PANEL WITH GFR, QuantiFERON-TB Gold Plus  Bilateral sacroiliitis [facility]): He has no SI joint tenderness.    DDD (degenerative disc disease), cervical: He has good ROM with no discomfort.  He has no symptoms of radiculopathy.  DDD (degenerative disc disease), lumbar: He has chronic lower back pain.  He had spinal epidural injection performed at [facility] several weeks ago, which resolved his left sided radiculopathy.  He continues to have midline spinal tenderness.    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   History of total right hip replacement: He has slightly limited ROM.    Other medical conditions are listed as follows:   Primary insomnia  History of gastroesophageal reflux (GERD)  History of COPD  Smoker  History of hypothyroidism  History of depression   Orders: Orders Placed This Encounter  Procedures  . CBC with Differential/Platelet  . COMPLETE METABOLIC PANEL WITH GFR  . QuantiFERON-TB Gold Plus   No orders of the defined types were placed in this encounter.     Follow-Up Instructions: Return in about 5 months (around 07/24/2018) for Psoriatic arthritis.   Ofilia Neas, PA-C  Note - This record has been created using Dragon software.  Chart creation errors have been sought, but may not always  have been located. Such creation errors do not reflect on  the standard of medical care.

## 2018-02-16 NOTE — Telephone Encounter (Addendum)
Refill request received via fax  Last Visit: 07/19/17 Next Visit: 02/23/18 Labs: 10/28/17 WNL Tb gold: 01/18/17 Neg   Okay to refill 30 day supply per Dr. Estanislado Pandy

## 2018-02-16 NOTE — Telephone Encounter (Signed)
Called Cosentyx copay Support, to see if patient has a copay card for Cosentyx. Rep advised that patient has a copay card and says the pharmacy has been billing it. Told rep that patient says he still has an expensive copay. Rep advised that she could not disclose any more information to me that the patient would need to call.  Called patient to advise.  Phone# [phone]  11:26 AM Beatriz Chancellor, CPhT

## 2018-02-17 ENCOUNTER — Encounter (INDEPENDENT_AMBULATORY_CARE_PROVIDER_SITE_OTHER): Payer: Self-pay | Admitting: Orthopaedic Surgery

## 2018-02-17 ENCOUNTER — Ambulatory Visit (INDEPENDENT_AMBULATORY_CARE_PROVIDER_SITE_OTHER): Payer: Managed Care, Other (non HMO) | Admitting: Orthopaedic Surgery

## 2018-02-17 VITALS — BP 148/82 | HR 94 | Ht 72.0 in | Wt 202.0 lb

## 2018-02-17 DIAGNOSIS — M5442 Lumbago with sciatica, left side: Secondary | ICD-10-CM

## 2018-02-17 NOTE — Progress Notes (Signed)
Office Visit Note   Patient: Charles [name]           Date of Birth: 05/21/1970           MRN: [id number] Visit Date: 02/17/2018              Requested by: Celene Squibb, MD [place], [place] [address] PCP: Celene Squibb, MD   Assessment & Plan: Visit Diagnoses:  1. Acute left-sided low back pain with left-sided sciatica     Plan: Had an epidural steroid injection through [facility] imaging and has done very well.  He no longer has the pain in his left leg.  Still has some soreness in his back with some evidence of degenerative change so I will set up a course of physical therapy.  We will plan to see him back in the next 6 to 8 weeks if necessary.  He should give [place] a call if he begins to have any further pain in his left leg to consider another injection  Follow-Up Instructions: Return if symptoms worsen or fail to improve.   Orders:  Orders Placed This Encounter  Procedures  . Ambulatory referral to Physical Therapy   No orders of the defined types were placed in this encounter.     Procedures: No procedures performed   Clinical Data: No additional findings.   Subjective: Chief Complaint  Patient presents with  . Lower Back - Follow-up  . Left Hip - Follow-up  Patient presents for a two week follow up of his lower back pain and left hip pain. He had an epidural spinal injection on 02/07/18. Patient states that he injection has helped. He is taking oxycodone for pain. Patient states that today he picked up something that was not heavy and experienced a shooting pain down his right leg and into his toes.  Presently not having any pain in either lower extremity but does have some burning in the low back over the course of the workday particularly if he does any heavy lifting.  HPI  Review of Systems   Objective: Vital Signs: BP (!) 148/82   Pulse 94   Ht 6' (1.829 m)   Wt 202 lb (91.6 kg)   BMI 27.40 kg/m   Physical Exam Constitutional:    Appearance: He is well-developed.  Eyes:     Pupils: Pupils are equal, round, and reactive to light.  Pulmonary:     Effort: Pulmonary effort is normal.  Skin:    General: Skin is warm and dry.  Neurological:     Mental Status: He is alert and oriented to person, place, and time.  Psychiatric:        Behavior: Behavior normal.     Ortho Exam awake alert and oriented x3.  Comfortable sitting.  Walks without a limp.  Straight leg raise negative.  No pain to palpation in either thigh.  Motor exams appear to be intact.  Painless range of motion of both hips.  No significant percussible tenderness of the lumbar spine Specialty Comments:  No specialty comments available.  Imaging: No results found.   PMFS History: Patient Active Problem List   Diagnosis Date Noted  . Acute left-sided low back pain with left-sided sciatica 02/01/2018  . Pain in left hip 02/01/2018  . Constipation 05/11/2017  . Chronic right-sided low back pain with right-sided sciatica 04/05/2017  . 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 Bilateral sacroiliitis ([place]) 05/22/2016  .  Psoriatic arthritis ([place]) 05/22/2016  . High risk medications (not anticoagulants) long-term use 05/22/2016  . DJD (degenerative joint disease), cervical 05/22/2016  . Osteoarthritis of spine with radiculopathy, lumbar region 05/22/2016  . Cephalalgia 03/16/2016  . Chronic daily headache 03/16/2016  . Neck pain 03/16/2016  . Hemorrhoids 03/13/2016  . 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 Insomnia 12/26/2015  . Abdominal pain, epigastric 11/13/2015  . Dark stools 11/13/2015  . Rectal bleeding 11/13/2015  . Numbness 09/26/2015  . 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 Avascular necrosis of left femoral head ([place]) 07/30/2015  . Avascular necrosis of right femoral head ([place]) 07/30/2015  . Psoriasis 07/30/2015  . Chronic obstructive pulmonary disease  (COPD) ([place]) 07/30/2015  . 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   Past Medical History:  Diagnosis Date  . Arthritis    also has avascular necrosis   . Chronic back pain   . Chronic neck pain   . Chronic shoulder pain   . COPD (chronic obstructive pulmonary disease) ([place])   . GERD (gastroesophageal reflux disease)    tums  OTC  medication  . Headache   . Hypothyroidism   . Sleep apnea    tested more than 5 yrs ago...negative results    Family History  Problem Relation Age of Onset  . Heart failure Mother   . Diabetes Mother   . Hypertension Mother   . Heart failure Father   . Prostate cancer Father   . Healthy Daughter   . Colon cancer Neg Hx     Past Surgical History:  Procedure Laterality Date  . COLONOSCOPY WITH PROPOFOL N/A 12/09/2015   Procedure: COLONOSCOPY WITH PROPOFOL;  Surgeon: Daneil Dolin, MD;  [facility];  Service: Endoscopy;  Laterality: N/A;  1100  . ESOPHAGOGASTRODUODENOSCOPY (EGD) WITH PROPOFOL N/A 12/09/2015   Procedure: ESOPHAGOGASTRODUODENOSCOPY (EGD) WITH PROPOFOL;  Surgeon: Daneil Dolin, MD;  [facility];  Service: Endoscopy;  Laterality: N/A;  . JOINT REPLACEMENT     left hip 2017  . NECK SURGERY    . SHOULDER SURGERY    . TOTAL HIP ARTHROPLASTY Left 07/30/2015   Procedure: TOTAL HIP ARTHROPLASTY;  Surgeon: Garald Balding, MD;  Location: [facility];  Service: Orthopedics;  Laterality: Left;  . TOTAL HIP ARTHROPLASTY Right 09/22/2016  . TOTAL HIP ARTHROPLASTY Right 09/22/2016   Procedure: RIGHT TOTAL HIP ARTHROPLASTY;  Surgeon: Garald Balding, MD;  Location: [facility];  Service: Orthopedics;  Laterality: Right;   Social History   Occupational History  . Not on file  Tobacco Use  . Smoking status: Current Every Day Smoker    Packs/day: 0.50    Years: 25.00    Pack years: 12.50    Types: Cigarettes  . Smokeless tobacco: Never Used  Substance and Sexual Activity  . Alcohol use: Yes    Alcohol/week: 7.0  standard drinks    Types: 7 Cans of beer per week  . Drug use: No    Comment: states quit  . Sexual activity: Not on file

## 2018-02-18 MED ORDER — SECUKINUMAB (300 MG DOSE) 150 MG/ML ~~LOC~~ SOAJ: 300.0000 mg | SUBCUTANEOUS | 0 refills | Status: DC

## 2018-02-23 ENCOUNTER — Encounter: Payer: Self-pay | Admitting: Physician Assistant

## 2018-02-23 ENCOUNTER — Ambulatory Visit: Payer: Managed Care, Other (non HMO) | Admitting: Physician Assistant

## 2018-02-23 VITALS — BP 126/82 | HR 71 | Resp 15 | Ht 72.0 in | Wt 202.0 lb

## 2018-02-23 DIAGNOSIS — Z96642 Presence of left artificial hip joint: Secondary | ICD-10-CM

## 2018-02-23 DIAGNOSIS — Z8709 Personal history of other diseases of the respiratory system: Secondary | ICD-10-CM

## 2018-02-23 DIAGNOSIS — M5030 Other cervical disc degeneration, unspecified cervical region: Secondary | ICD-10-CM

## 2018-02-23 DIAGNOSIS — M461 Sacroiliitis, not elsewhere classified: Secondary | ICD-10-CM

## 2018-02-23 DIAGNOSIS — G5711 Meralgia paresthetica, right lower limb: Secondary | ICD-10-CM

## 2018-02-23 DIAGNOSIS — L4050 Arthropathic psoriasis, unspecified: Secondary | ICD-10-CM | POA: Diagnosis not present

## 2018-02-23 DIAGNOSIS — Z8639 Personal history of other endocrine, nutritional and metabolic disease: Secondary | ICD-10-CM

## 2018-02-23 DIAGNOSIS — Z79899 Other long term (current) drug therapy: Secondary | ICD-10-CM | POA: Diagnosis not present

## 2018-02-23 DIAGNOSIS — Z96641 Presence of right artificial hip joint: Secondary | ICD-10-CM

## 2018-02-23 DIAGNOSIS — G5712 Meralgia paresthetica, left lower limb: Secondary | ICD-10-CM

## 2018-02-23 DIAGNOSIS — L409 Psoriasis, unspecified: Secondary | ICD-10-CM | POA: Diagnosis not present

## 2018-02-23 DIAGNOSIS — F5101 Primary insomnia: Secondary | ICD-10-CM

## 2018-02-23 DIAGNOSIS — Z8719 Personal history of other diseases of the digestive system: Secondary | ICD-10-CM

## 2018-02-23 DIAGNOSIS — Z8659 Personal history of other mental and behavioral disorders: Secondary | ICD-10-CM

## 2018-02-23 DIAGNOSIS — F17200 Nicotine dependence, unspecified, uncomplicated: Secondary | ICD-10-CM

## 2018-02-23 DIAGNOSIS — M5136 Other intervertebral disc degeneration, lumbar region: Secondary | ICD-10-CM

## 2018-02-23 NOTE — Telephone Encounter (Signed)
Received a Prior Authorization request from [place] for [place]. Authorization has been submitted to patient's insurance via Cover My Meds. Will update once we receive a response.

## 2018-02-23 NOTE — Patient Instructions (Signed)
Standing Labs We placed an order today for your standing lab work.    Please come back and get your standing labs in May and every 3 months  We have open lab Monday through Friday from 8:30-11:30 AM and 1:30-4:00 PM  at the office of Dr. Shaili Deveshwar.   You may experience shorter wait times on Monday and Friday afternoons. The office is located at [address], [address], [place], [place] [address] No appointment is necessary.   Labs are drawn by [facility].  You may receive a bill from [facility] for your lab work.  If you wish to have your labs drawn at another location, please call the office 24 hours in advance to send orders.  If you have any questions regarding directions or hours of operation,  please call [phone].   Just as a reminder please drink plenty of water prior to coming for your lab work. Thanks!  

## 2018-02-24 NOTE — Progress Notes (Signed)
Labs are WNL.

## 2018-02-24 NOTE — Telephone Encounter (Signed)
Received a fax from [employer] regarding a prior authorization for Charles [name]. Authorization has been APPROVED from 02/23/2018 to 02/23/2019.   Will send document to scan center.  Authorization # [id number]

## 2018-02-25 LAB — QUANTIFERON-TB GOLD PLUS
Mitogen-NIL: 7.7 IU/mL
NIL: 0.02 IU/mL
QuantiFERON-TB Gold Plus: NEGATIVE
TB1-NIL: 0 IU/mL
TB2-NIL: 0 IU/mL

## 2018-02-25 LAB — COMPLETE METABOLIC PANEL WITH GFR
AG Ratio: 2 (calc) (ref 1.0–2.5)
ALT: 12 U/L (ref 9–46)
AST: 15 U/L (ref 10–40)
Albumin: 4.5 g/dL (ref 3.6–5.1)
Alkaline phosphatase (APISO): 43 U/L (ref 36–130)
BILIRUBIN TOTAL: 0.9 mg/dL (ref 0.2–1.2)
BUN: 16 mg/dL (ref 7–25)
CO2: 26 mmol/L (ref 20–32)
Calcium: 9.8 mg/dL (ref 8.6–10.3)
Chloride: 103 mmol/L (ref 98–110)
Creat: 0.87 mg/dL (ref 0.60–1.35)
GFR, EST AFRICAN AMERICAN: 121 mL/min/{1.73_m2} (ref >=60)
GFR, Est Non African American: 104 mL/min/{1.73_m2} (ref >=60)
Globulin: 2.3 g/dL (calc) (ref 1.9–3.7)
Glucose, Bld: 88 mg/dL (ref 65–99)
Potassium: 4.5 mmol/L (ref 3.5–5.3)
Sodium: 139 mmol/L (ref 135–146)
TOTAL PROTEIN: 6.8 g/dL (ref 6.1–8.1)

## 2018-02-25 LAB — CBC WITH DIFFERENTIAL/PLATELET
Absolute Monocytes: 623 cells/uL (ref 200–950)
Basophils Absolute: 42 cells/uL (ref 0–200)
Basophils Relative: 0.6 %
Eosinophils Absolute: 140 cells/uL (ref 15–500)
Eosinophils Relative: 2 %
HCT: 42.2 % (ref 38.5–50.0)
Hemoglobin: 15.2 g/dL (ref 13.2–17.1)
Lymphs Abs: 2149 cells/uL (ref 850–3900)
MCH: 34 pg — ABNORMAL HIGH (ref 27.0–33.0)
MCHC: 36 g/dL (ref 32.0–36.0)
MCV: 94.4 fL (ref 80.0–100.0)
MPV: 12.3 fL (ref 7.5–12.5)
Monocytes Relative: 8.9 %
NEUTROS ABS: 4046 {cells}/uL (ref 1500–7800)
Neutrophils Relative %: 57.8 %
Platelets: 186 10*3/uL (ref 140–400)
RBC: 4.47 10*6/uL (ref 4.20–5.80)
RDW: 12.4 % (ref 11.0–15.0)
Total Lymphocyte: 30.7 %
WBC: 7 10*3/uL (ref 3.8–10.8)

## 2018-02-25 NOTE — Progress Notes (Signed)
TB gold negative

## 2018-03-02 ENCOUNTER — Other Ambulatory Visit: Payer: Self-pay

## 2018-03-02 ENCOUNTER — Encounter (HOSPITAL_COMMUNITY): Payer: Self-pay

## 2018-03-02 ENCOUNTER — Ambulatory Visit (HOSPITAL_COMMUNITY): Payer: Managed Care, Other (non HMO) | Attending: Orthopaedic Surgery

## 2018-03-02 DIAGNOSIS — R293 Abnormal posture: Secondary | ICD-10-CM | POA: Insufficient documentation

## 2018-03-02 DIAGNOSIS — R29898 Other symptoms and signs involving the musculoskeletal system: Secondary | ICD-10-CM | POA: Diagnosis present

## 2018-03-02 DIAGNOSIS — G8929 Other chronic pain: Secondary | ICD-10-CM | POA: Insufficient documentation

## 2018-03-02 DIAGNOSIS — M5441 Lumbago with sciatica, right side: Secondary | ICD-10-CM | POA: Insufficient documentation

## 2018-03-02 DIAGNOSIS — M5442 Lumbago with sciatica, left side: Secondary | ICD-10-CM | POA: Insufficient documentation

## 2018-03-02 NOTE — Patient Instructions (Signed)
Standing Lumbar Extension with Counter reps: 10 sets: 1 hold: 10 seconds daily: 1  weekly: 7      Exercise image step 1   Exercise image step 2  Setup  Begin in a standing upright position in front of a counter, with your hands resting on your hips. Movement  Slowly arch your trunk backwards and hold. Tip  Make sure to use the counter for balance as needed and do not bend your knees during the exercise.  
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
## 2018-03-09 ENCOUNTER — Ambulatory Visit (HOSPITAL_COMMUNITY): Payer: Managed Care, Other (non HMO)

## 2018-03-11 ENCOUNTER — Ambulatory Visit (HOSPITAL_COMMUNITY): Payer: Managed Care, Other (non HMO)

## 2018-03-11 ENCOUNTER — Telehealth (HOSPITAL_COMMUNITY): Payer: Self-pay | Admitting: Internal Medicine

## 2018-03-11 NOTE — Telephone Encounter (Signed)
03/11/18  pt called to cx but no reason was given

## 2018-03-16 ENCOUNTER — Ambulatory Visit (HOSPITAL_COMMUNITY): Payer: Managed Care, Other (non HMO) | Attending: Orthopaedic Surgery

## 2018-03-17 ENCOUNTER — Telehealth (HOSPITAL_COMMUNITY): Payer: Self-pay

## 2018-03-17 NOTE — Telephone Encounter (Signed)
No Show #1: I called and left a voice message on the patient's machine as he did not arrive yesterday for his 4:45 PM appointment. I informed him his next appointment is scheduled for 5:30 PM tomorrow and that we have a 4:45 opening if he is interested in coming in earlier. I asked that he give our front office a call and provided the number if he is unable to make his appointment or would like to reschedule.   Kipp Brood, PT, DPT, [facility] Physical Therapist with [facility]  03/17/2018 5:45 PM

## 2018-03-17 NOTE — Telephone Encounter (Signed)
He can not afford to do this right now. Please D/c patient. NF 03/17/2018

## 2018-03-18 ENCOUNTER — Ambulatory Visit (HOSPITAL_COMMUNITY): Payer: Managed Care, Other (non HMO)

## 2018-03-22 ENCOUNTER — Encounter: Payer: Self-pay | Admitting: Nurse Practitioner

## 2018-03-23 ENCOUNTER — Ambulatory Visit (HOSPITAL_COMMUNITY): Payer: Managed Care, Other (non HMO)

## 2018-03-25 ENCOUNTER — Ambulatory Visit (HOSPITAL_COMMUNITY): Payer: Managed Care, Other (non HMO)

## 2018-03-30 ENCOUNTER — Encounter (HOSPITAL_COMMUNITY): Payer: Managed Care, Other (non HMO)

## 2018-04-01 ENCOUNTER — Encounter (HOSPITAL_COMMUNITY): Payer: Managed Care, Other (non HMO)

## 2018-04-01 ENCOUNTER — Other Ambulatory Visit (HOSPITAL_COMMUNITY): Payer: Self-pay | Admitting: Internal Medicine

## 2018-04-01 ENCOUNTER — Ambulatory Visit (HOSPITAL_COMMUNITY)
Admission: RE | Admit: 2018-04-01 | Discharge: 2018-04-01 | Disposition: A | Discharge status: Home / Self Care | Payer: Managed Care, Other (non HMO) | Source: Ambulatory Visit | Attending: Internal Medicine | Admitting: Internal Medicine

## 2018-04-01 ENCOUNTER — Other Ambulatory Visit: Payer: Self-pay

## 2018-04-01 DIAGNOSIS — R05 Cough: Secondary | ICD-10-CM | POA: Diagnosis not present

## 2018-04-01 DIAGNOSIS — R059 Cough, unspecified: Secondary | ICD-10-CM

## 2018-04-06 ENCOUNTER — Encounter (HOSPITAL_COMMUNITY): Payer: Managed Care, Other (non HMO)

## 2018-04-08 ENCOUNTER — Encounter (HOSPITAL_COMMUNITY): Payer: Managed Care, Other (non HMO) | Admitting: Physical Therapy

## 2018-04-13 ENCOUNTER — Encounter (HOSPITAL_COMMUNITY): Payer: Managed Care, Other (non HMO)

## 2018-04-15 ENCOUNTER — Encounter (HOSPITAL_COMMUNITY): Payer: Managed Care, Other (non HMO) | Admitting: Physical Therapy

## 2018-05-25 ENCOUNTER — Encounter: Payer: Self-pay | Admitting: Nurse Practitioner

## 2018-05-25 ENCOUNTER — Other Ambulatory Visit: Payer: Self-pay | Admitting: *Deleted

## 2018-05-25 ENCOUNTER — Ambulatory Visit (INDEPENDENT_AMBULATORY_CARE_PROVIDER_SITE_OTHER): Payer: Managed Care, Other (non HMO) | Admitting: Nurse Practitioner

## 2018-05-25 ENCOUNTER — Telehealth: Payer: Self-pay | Admitting: *Deleted

## 2018-05-25 ENCOUNTER — Other Ambulatory Visit: Payer: Self-pay

## 2018-05-25 DIAGNOSIS — R1310 Dysphagia, unspecified: Secondary | ICD-10-CM | POA: Diagnosis not present

## 2018-05-25 DIAGNOSIS — R1319 Other dysphagia: Secondary | ICD-10-CM

## 2018-05-25 DIAGNOSIS — R1013 Epigastric pain: Secondary | ICD-10-CM

## 2018-05-25 DIAGNOSIS — K219 Gastro-esophageal reflux disease without esophagitis: Secondary | ICD-10-CM | POA: Insufficient documentation

## 2018-05-25 NOTE — Telephone Encounter (Signed)
Called patient to schedule EGD +/-DIl with [facility]. Offered 1st available July 9th but patient stated he needed sooner as he has been waiting 3 months for this appt. I spoke with EG and he stated to work patient in sooner.   Procedure scheduled for 06/09/2018 at 8am. Patient aware will need pre-op appt and he will have to go for COVID-19 testing. I will mail egd instructions (confirmed address). Aware will need to stay quarantined after COVID-19 testing is done until after procedure. Aware endo will call to schedule this.

## 2018-05-25 NOTE — Assessment & Plan Note (Signed)
Per the patient, and as noted in PCP office note, the patient has lower esophageal/epigastric discomfort right at the xiphoid process area.  The pain is intermittent, "very sharp ", lasts for up to an hour.  He has a known hiatal hernia, per the patient.  He is also previously "coughed up blood" that he thought might be from his lungs but chest x-ray was essentially normal.  He was told by primary care the blood could be coming from his hiatal hernia.  At this point given his dysphasia and abdominal pain, we will further evaluate his hernia with an upper endoscopy.  If his hernia has gotten significantly large or has signs of Cameron lesions and possible upper GI bleed (the patient does deny melena) and he may need to be referred for Nissen fundoplication or endoscopic incisionless fundoplication.  Continue current medications otherwise and follow-up in 3 months.

## 2018-05-25 NOTE — Assessment & Plan Note (Signed)
History of GERD that was previously not well managed not on any medications.  Primary care started him on Protonix and he states his GERD has been doing well since then.  Recommend he continue to monitor for any worsening symptoms and follow-up in 3 months.

## 2018-05-25 NOTE — Progress Notes (Signed)
Referring Provider: Celene Squibb, MD Primary Care Physician:  Celene Squibb, MD Primary GI:  Dr. Gala Romney  NOTE: Service was provided via telemedicine and was requested by the patient due to COVID-19 pandemic.  Method of visit: Doxy.Me  Patient Location: In his car parked in work parking lot  Provider Location: Office  Reason for Phone Visit: Referral from PCP  The patient was consented to phone follow-up via telephone encounter including billing of the encounter (yes/no): Yes  Persons present on the phone encounter, with roles: None  Total time (minutes) spent on medical discussion: 21 minutes  Chief Complaint  Patient presents with  . Hiatal Hernia    no abd pain currently, no nausea/vomiting,     HPI:   Charles [name] is a 48 y.o. male who presents for virtual visit regarding: Referral from primary care for hernias.  Reviewed information provided with referral including office visit dated 03/11/2018.  At that time noted lower sternum area hurts visit hernia also admitted worsening heartburn.  He was noted to be on Protonix 40 mg daily at that time.  Noted a bulge where the abdomen midsternum which was noted to be tender.  Felt to be likely hiatal hernia.  This is causing pain and he was subsequently referred to GI.  No change to his PPI prescription at that time.  We previously saw the patient 03/13/2016 for rectal bleeding and hemorrhoids.  Noted history of GERD and on Protonix without relief.  Intermittent NSAIDs at that time.  EGD last completed 12/09/2015 with LA grade B esophagitis.  Specifically there is no note of a hiatal hernia.  Colonoscopy at the same day found four tubular adenoma polyps.  At his last visit he felt GERD was well controlled on Protonix once daily.  He was also seen 04/14/2016 for internal hemorrhoid banding which was successful.  Today he states he's doing ok overall. He has been having abdominal pain in his lower esophagus. Pain described as very sharp  "like I'm gonna have a heart attack." Pain was constant, but has been intermittent more recently. Typically lasts up to an hour. GERD is doing pretty well since on Protonix. Denies other abdominal pain. Occasional solid food dysphagia about 2-3 times a month and typically passes on it's own. Denies pill dysphagia. When he has an episode of dysphagia he has odynophagia as well. Denies NSAIDs or ASA powders. Has rare scant toilet tissue hematochezia with known hemorrhoids. Has had a couple incidents of "coughing up blood" and saw PCP; Chest XRay completed and normal, was told by PCP it may be coming from his hiatal hernia. Denies melena, fever, chills, unintentional weight loss, N/V. Denies URI or flu-like symptoms. Denies loss of sense of taste or smell. Denies chest pain, dyspnea, dizziness, lightheadedness, syncope, near syncope. Denies any other upper or lower GI symptoms.  Past Medical History:  Diagnosis Date  . Arthritis    also has avascular necrosis   . Chronic back pain   . Chronic neck pain   . Chronic shoulder pain   . COPD (chronic obstructive pulmonary disease) ([place])   . GERD (gastroesophageal reflux disease)    tums  OTC  medication  . Headache   . Hypothyroidism   . Psoriatic arthritis ([place])   . Sleep apnea    tested more than 5 yrs ago...negative results    Past Surgical History:  Procedure Laterality Date  . COLONOSCOPY WITH PROPOFOL N/A 12/09/2015   Procedure: COLONOSCOPY WITH PROPOFOL;  Surgeon: Daneil Dolin, MD;  [facility];  Service: Endoscopy;  Laterality: N/A;  1100  . ESOPHAGOGASTRODUODENOSCOPY (EGD) WITH PROPOFOL N/A 12/09/2015   Procedure: ESOPHAGOGASTRODUODENOSCOPY (EGD) WITH PROPOFOL;  Surgeon: Daneil Dolin, MD;  [facility];  Service: Endoscopy;  Laterality: N/A;  . JOINT REPLACEMENT     left hip 2017  . NECK SURGERY    . SHOULDER SURGERY    . TOTAL HIP ARTHROPLASTY Left 07/30/2015   Procedure: TOTAL HIP ARTHROPLASTY;  Surgeon: Garald Balding, MD;  Location: [facility];  Service: Orthopedics;  Laterality: Left;  . TOTAL HIP ARTHROPLASTY Right 09/22/2016  . TOTAL HIP ARTHROPLASTY Right 09/22/2016   Procedure: RIGHT TOTAL HIP ARTHROPLASTY;  Surgeon: Garald Balding, MD;  Location: [facility];  Service: Orthopedics;  Laterality: Right;    Current Outpatient Medications  Medication Sig Dispense Refill  . albuterol (PROVENTIL HFA;VENTOLIN HFA) 108 (90 Base) MCG/ACT inhaler as needed.     . cyclobenzaprine (FLEXERIL) 5 MG tablet Take 5 mg by mouth 3 (three) times daily as needed for muscle spasms.     Marland Kitchen gabapentin (NEURONTIN) 300 MG capsule Take 1 tablet at bedtime 30 capsule 1  . oxyCODONE-acetaminophen (PERCOCET/ROXICET) 5-325 MG tablet Take 1-2 tablets by mouth every 4 (four) hours as needed for moderate pain or severe pain. 80 tablet 0  . pantoprazole (PROTONIX) 40 MG tablet Take 40 mg by mouth daily.  0  . Secukinumab, 300 MG Dose, (COSENTYX SENSOREADY, 300 MG,) 150 MG/ML SOAJ Inject 300 mg into the skin every 30 (thirty) days. 2 pen 0   No current facility-administered medications for this visit.     Allergies as of 05/25/2018  . (No Known Allergies)    Family History  Problem Relation Age of Onset  . Heart failure Mother   . Diabetes Mother   . Hypertension Mother   . Heart failure Father   . Prostate cancer Father   . Arthritis Father   . Hypertension Sister   . Heart attack Sister   . Hypercholesterolemia Sister   . Healthy Daughter   . Colon cancer Neg Hx   . Gastric cancer Neg Hx   . Esophageal cancer Neg Hx     Social History   Socioeconomic History  . Marital status: Married    Spouse name: Not on file  . Number of children: Not on file  . Years of education: Not on file  . Highest education level: Not on file  Occupational History  . Not on file  Social Needs  . Financial resource strain: Not on file  . Food insecurity:    Worry: Not on file    Inability: Not on file  . Transportation  needs:    Medical: Not on file    Non-medical: Not on file  Tobacco Use  . Smoking status: Former Smoker    Packs/day: 0.50    Years: 25.00    Pack years: 12.50    Types: Cigarettes  . Smokeless tobacco: Never Used  Substance and Sexual Activity  . Alcohol use: Yes    Alcohol/week: 7.0 standard drinks    Types: 7 Cans of beer per week  . Drug use: No    Comment: states quit  . Sexual activity: Not on file  Lifestyle  . Physical activity:    Days per week: Not on file    Minutes per session: Not on file  . Stress: Not on file  Relationships  . Social  connections:    Talks on phone: Not on file    Gets together: Not on file    Attends religious service: Not on file    Active member of club or organization: Not on file    Attends meetings of clubs or organizations: Not on file    Relationship status: Not on file  Other Topics Concern  . Not on file  Social History Narrative  . Not on file    Review of Systems: General: Negative for anorexia, weight loss, fever, chills, fatigue, weakness. ENT: Negative for hoarseness. Noted dysphagia as per HPI. CV: Negative for chest pain, angina, palpitations, peripheral edema.  Respiratory: Negative for dyspnea at rest, cough, sputum, wheezing.  GI: See history of present illness. MS: Admits chronic pain.  Derm: Negative for rash or itching.  Endo: Negative for unusual weight change.  Heme: Negative for bruising or bleeding. Allergy: Negative for rash or hives.  Physical Exam: Note: limited exam due to virtual visit General:   Alert and oriented. Pleasant and cooperative. Well-nourished and well-developed.  Head:  Normocephalic and atraumatic. Eyes:  Without icterus, sclera clear and conjunctiva pink.  Ears:  Normal auditory acuity. Skin:  Intact without facial significant lesions or rashes. Neurologic:  Alert and oriented x4;  grossly normal neurologically. Psych:  Alert and cooperative. Normal mood and affect.  Heme/Lymph/Immune: No excessive bruising noted.

## 2018-05-25 NOTE — Patient Instructions (Signed)
Your health issues we discussed today were:   Abdominal pain, swallowing difficulties, GERD (reflux/heartburn) with known hiatal hernia: 1. Given all of your upper GI symptoms, I feel the best way to fully evaluate everything is to proceed with an upper endoscopy with possible dilation 2. In the interim, continue your current medications especially Protonix daily 3. Call us if you have any worsening or severe symptoms  Overall I recommend:  1. Continue your other current medications 2. Follow-up in 3 months 3. Call us if you have any questions or concerns   Because of recent events of COVID-19 ("Coronavirus"), follow CDC recommendations:  1. Wash your hand frequently 2. Avoid touching your face 3. Stay away from people who are sick 4. If you have symptoms such as fever, cough, shortness of breath then call your healthcare provider for further guidance 5. If you are sick, STAY AT HOME unless otherwise directed by your healthcare provider. 6. Follow directions from state and national officials regarding staying safe   At [facility] we value your feedback. You may receive a survey about your visit today. Please share your experience as we strive to create trusting relationships with our patients to provide genuine, compassionate, quality care.  We appreciate your understanding and patience as we review any laboratory studies, imaging, and other diagnostic tests that are ordered as we care for you. Our office policy is 5 business days for review of these results, and any emergent or urgent results are addressed in a timely manner for your best interest. If you do not hear from our office in 1 week, please contact us.   We also encourage the use of MyChart, which contains your medical information for your review as well. If you are not enrolled in this feature, an access code is on this after visit summary for your convenience. Thank you for allowing [place] to be involved in your  care.  It was great to see you today!  I hope you have a great day!!

## 2018-05-25 NOTE — Assessment & Plan Note (Signed)
The patient notes intermittent solid food dysphasia which occurs a couple times a month.  He states he has a known hiatal hernia and this could be causing his dysphagia.  He could also have a stricture, web, ring.  Also possible chronic GERD etiology.  At this point given his significant symptoms with a hiatal hernia, dysphasia, history of GERD we will plan for evaluation by upper endoscopy.  Further recommendations to follow.  Continue current medications otherwise and follow-up in our office in 3 months.  Proceed with EGD +/- dilation on propofol/MAC with Dr. Gala Romney in near future: the risks, benefits, and alternatives have been discussed with the patient in detail. The patient states understanding and desires to proceed.  The patient is currently on Flexeril, Neurontin, Percocet.  No other anticoagulants, anxiolytics, chronic pain medications, or antidepressants.  Social alcohol use, denies recreational drugs.  We will plan for the procedure on propofol/MAC to promote adequate sedation.

## 2018-05-25 NOTE — H&P (View-Only) (Signed)
Referring Provider: Celene Squibb, MD Primary Care Physician:  Celene Squibb, MD Primary GI:  Dr. Gala Romney  NOTE: Service was provided via telemedicine and was requested by the patient due to COVID-19 pandemic.  Method of visit: Doxy.Me  Patient Location: In his car parked in work parking lot  Provider Location: Office  Reason for Phone Visit: Referral from PCP  The patient was consented to phone follow-up via telephone encounter including billing of the encounter (yes/no): Yes  Persons present on the phone encounter, with roles: None  Total time (minutes) spent on medical discussion: 21 minutes  Chief Complaint  Patient presents with  . Hiatal Hernia    no abd pain currently, no nausea/vomiting,     HPI:   Charles [name] is a 48 y.o. male who presents for virtual visit regarding: Referral from primary care for hernias.  Reviewed information provided with referral including office visit dated 03/11/2018.  At that time noted lower sternum area hurts visit hernia also admitted worsening heartburn.  He was noted to be on Protonix 40 mg daily at that time.  Noted a bulge where the abdomen midsternum which was noted to be tender.  Felt to be likely hiatal hernia.  This is causing pain and he was subsequently referred to GI.  No change to his PPI prescription at that time.  We previously saw the patient 03/13/2016 for rectal bleeding and hemorrhoids.  Noted history of GERD and on Protonix without relief.  Intermittent NSAIDs at that time.  EGD last completed 12/09/2015 with LA grade B esophagitis.  Specifically there is no note of a hiatal hernia.  Colonoscopy at the same day found four tubular adenoma polyps.  At his last visit he felt GERD was well controlled on Protonix once daily.  He was also seen 04/14/2016 for internal hemorrhoid banding which was successful.  Today he states he's doing ok overall. He has been having abdominal pain in his lower esophagus. Pain described as very sharp  "like I'm gonna have a heart attack." Pain was constant, but has been intermittent more recently. Typically lasts up to an hour. GERD is doing pretty well since on Protonix. Denies other abdominal pain. Occasional solid food dysphagia about 2-3 times a month and typically passes on it's own. Denies pill dysphagia. When he has an episode of dysphagia he has odynophagia as well. Denies NSAIDs or ASA powders. Has rare scant toilet tissue hematochezia with known hemorrhoids. Has had a couple incidents of "coughing up blood" and saw PCP; Chest XRay completed and normal, was told by PCP it may be coming from his hiatal hernia. Denies melena, fever, chills, unintentional weight loss, N/V. Denies URI or flu-like symptoms. Denies loss of sense of taste or smell. Denies chest pain, dyspnea, dizziness, lightheadedness, syncope, near syncope. Denies any other upper or lower GI symptoms.  Past Medical History:  Diagnosis Date  . Arthritis    also has avascular necrosis   . Chronic back pain   . Chronic neck pain   . Chronic shoulder pain   . COPD (chronic obstructive pulmonary disease) ([place])   . GERD (gastroesophageal reflux disease)    tums  OTC  medication  . Headache   . Hypothyroidism   . Psoriatic arthritis ([place])   . Sleep apnea    tested more than 5 yrs ago...negative results    Past Surgical History:  Procedure Laterality Date  . COLONOSCOPY WITH PROPOFOL N/A 12/09/2015   Procedure: COLONOSCOPY WITH PROPOFOL;  Surgeon: Daneil Dolin, MD;  [facility];  Service: Endoscopy;  Laterality: N/A;  1100  . ESOPHAGOGASTRODUODENOSCOPY (EGD) WITH PROPOFOL N/A 12/09/2015   Procedure: ESOPHAGOGASTRODUODENOSCOPY (EGD) WITH PROPOFOL;  Surgeon: Daneil Dolin, MD;  [facility];  Service: Endoscopy;  Laterality: N/A;  . JOINT REPLACEMENT     left hip 2017  . NECK SURGERY    . SHOULDER SURGERY    . TOTAL HIP ARTHROPLASTY Left 07/30/2015   Procedure: TOTAL HIP ARTHROPLASTY;  Surgeon: Garald Balding, MD;  Location: [facility];  Service: Orthopedics;  Laterality: Left;  . TOTAL HIP ARTHROPLASTY Right 09/22/2016  . TOTAL HIP ARTHROPLASTY Right 09/22/2016   Procedure: RIGHT TOTAL HIP ARTHROPLASTY;  Surgeon: Garald Balding, MD;  Location: [facility];  Service: Orthopedics;  Laterality: Right;    Current Outpatient Medications  Medication Sig Dispense Refill  . albuterol (PROVENTIL HFA;VENTOLIN HFA) 108 (90 Base) MCG/ACT inhaler as needed.     . cyclobenzaprine (FLEXERIL) 5 MG tablet Take 5 mg by mouth 3 (three) times daily as needed for muscle spasms.     Marland Kitchen gabapentin (NEURONTIN) 300 MG capsule Take 1 tablet at bedtime 30 capsule 1  . oxyCODONE-acetaminophen (PERCOCET/ROXICET) 5-325 MG tablet Take 1-2 tablets by mouth every 4 (four) hours as needed for moderate pain or severe pain. 80 tablet 0  . pantoprazole (PROTONIX) 40 MG tablet Take 40 mg by mouth daily.  0  . Secukinumab, 300 MG Dose, (COSENTYX SENSOREADY, 300 MG,) 150 MG/ML SOAJ Inject 300 mg into the skin every 30 (thirty) days. 2 pen 0   No current facility-administered medications for this visit.     Allergies as of 05/25/2018  . (No Known Allergies)    Family History  Problem Relation Age of Onset  . Heart failure Mother   . Diabetes Mother   . Hypertension Mother   . Heart failure Father   . Prostate cancer Father   . Arthritis Father   . Hypertension Sister   . Heart attack Sister   . Hypercholesterolemia Sister   . Healthy Daughter   . Colon cancer Neg Hx   . Gastric cancer Neg Hx   . Esophageal cancer Neg Hx     Social History   Socioeconomic History  . Marital status: Married    Spouse name: Not on file  . Number of children: Not on file  . Years of education: Not on file  . Highest education level: Not on file  Occupational History  . Not on file  Social Needs  . Financial resource strain: Not on file  . Food insecurity:    Worry: Not on file    Inability: Not on file  . Transportation  needs:    Medical: Not on file    Non-medical: Not on file  Tobacco Use  . Smoking status: Former Smoker    Packs/day: 0.50    Years: 25.00    Pack years: 12.50    Types: Cigarettes  . Smokeless tobacco: Never Used  Substance and Sexual Activity  . Alcohol use: Yes    Alcohol/week: 7.0 standard drinks    Types: 7 Cans of beer per week  . Drug use: No    Comment: states quit  . Sexual activity: Not on file  Lifestyle  . Physical activity:    Days per week: Not on file    Minutes per session: Not on file  . Stress: Not on file  Relationships  . Social  connections:    Talks on phone: Not on file    Gets together: Not on file    Attends religious service: Not on file    Active member of club or organization: Not on file    Attends meetings of clubs or organizations: Not on file    Relationship status: Not on file  Other Topics Concern  . Not on file  Social History Narrative  . Not on file    Review of Systems: General: Negative for anorexia, weight loss, fever, chills, fatigue, weakness. ENT: Negative for hoarseness. Noted dysphagia as per HPI. CV: Negative for chest pain, angina, palpitations, peripheral edema.  Respiratory: Negative for dyspnea at rest, cough, sputum, wheezing.  GI: See history of present illness. MS: Admits chronic pain.  Derm: Negative for rash or itching.  Endo: Negative for unusual weight change.  Heme: Negative for bruising or bleeding. Allergy: Negative for rash or hives.  Physical Exam: Note: limited exam due to virtual visit General:   Alert and oriented. Pleasant and cooperative. Well-nourished and well-developed.  Head:  Normocephalic and atraumatic. Eyes:  Without icterus, sclera clear and conjunctiva pink.  Ears:  Normal auditory acuity. Skin:  Intact without facial significant lesions or rashes. Neurologic:  Alert and oriented x4;  grossly normal neurologically. Psych:  Alert and cooperative. Normal mood and affect.  Heme/Lymph/Immune: No excessive bruising noted.

## 2018-05-26 ENCOUNTER — Encounter: Payer: Self-pay | Admitting: Internal Medicine

## 2018-05-26 NOTE — Telephone Encounter (Signed)
Spoke with carolyn in endo and she will call patient to schedule pre-op and covid testing

## 2018-06-06 ENCOUNTER — Encounter (HOSPITAL_COMMUNITY)
Admission: RE | Admit: 2018-06-06 | Discharge: 2018-06-06 | Disposition: A | Discharge status: Home / Self Care | Payer: Managed Care, Other (non HMO) | Source: Ambulatory Visit | Attending: Internal Medicine | Admitting: Internal Medicine

## 2018-06-06 ENCOUNTER — Other Ambulatory Visit: Payer: Self-pay

## 2018-06-06 ENCOUNTER — Encounter (HOSPITAL_COMMUNITY): Payer: Self-pay

## 2018-06-06 ENCOUNTER — Other Ambulatory Visit (HOSPITAL_COMMUNITY)
Admission: RE | Admit: 2018-06-06 | Discharge: 2018-06-06 | Disposition: A | Discharge status: Home / Self Care | Payer: Managed Care, Other (non HMO) | Source: Ambulatory Visit | Attending: Internal Medicine | Admitting: Internal Medicine

## 2018-06-06 DIAGNOSIS — Z1159 Encounter for screening for other viral diseases: Secondary | ICD-10-CM | POA: Insufficient documentation

## 2018-06-07 LAB — NOVEL CORONAVIRUS, NAA (HOSP ORDER, SEND-OUT TO REF LAB; TAT 18-24 HRS): SARS-CoV-2, NAA: NOT DETECTED

## 2018-06-09 ENCOUNTER — Ambulatory Visit (HOSPITAL_COMMUNITY)
Admission: RE | Admit: 2018-06-09 | Discharge: 2018-06-09 | Disposition: A | Discharge status: Home / Self Care | Payer: Managed Care, Other (non HMO) | Attending: Internal Medicine | Admitting: Internal Medicine

## 2018-06-09 ENCOUNTER — Ambulatory Visit (HOSPITAL_COMMUNITY): Payer: Managed Care, Other (non HMO) | Admitting: Anesthesiology

## 2018-06-09 ENCOUNTER — Encounter (HOSPITAL_COMMUNITY): Payer: Self-pay | Admitting: Anesthesiology

## 2018-06-09 ENCOUNTER — Encounter (HOSPITAL_COMMUNITY)
Admission: RE | Disposition: A | Discharge status: Home / Self Care | Payer: Self-pay | Source: Home / Self Care | Attending: Internal Medicine

## 2018-06-09 ENCOUNTER — Other Ambulatory Visit: Payer: Self-pay

## 2018-06-09 DIAGNOSIS — Z87891 Personal history of nicotine dependence: Secondary | ICD-10-CM | POA: Diagnosis not present

## 2018-06-09 DIAGNOSIS — E039 Hypothyroidism, unspecified: Secondary | ICD-10-CM | POA: Diagnosis not present

## 2018-06-09 DIAGNOSIS — J449 Chronic obstructive pulmonary disease, unspecified: Secondary | ICD-10-CM | POA: Insufficient documentation

## 2018-06-09 DIAGNOSIS — K219 Gastro-esophageal reflux disease without esophagitis: Secondary | ICD-10-CM

## 2018-06-09 DIAGNOSIS — R1013 Epigastric pain: Secondary | ICD-10-CM

## 2018-06-09 DIAGNOSIS — R1310 Dysphagia, unspecified: Secondary | ICD-10-CM | POA: Diagnosis not present

## 2018-06-09 DIAGNOSIS — G4730 Sleep apnea, unspecified: Secondary | ICD-10-CM | POA: Insufficient documentation

## 2018-06-09 DIAGNOSIS — K209 Esophagitis, unspecified: Secondary | ICD-10-CM | POA: Diagnosis not present

## 2018-06-09 DIAGNOSIS — M1990 Unspecified osteoarthritis, unspecified site: Secondary | ICD-10-CM | POA: Insufficient documentation

## 2018-06-09 DIAGNOSIS — K210 Gastro-esophageal reflux disease with esophagitis: Secondary | ICD-10-CM | POA: Insufficient documentation

## 2018-06-09 DIAGNOSIS — K222 Esophageal obstruction: Secondary | ICD-10-CM | POA: Insufficient documentation

## 2018-06-09 DIAGNOSIS — R12 Heartburn: Secondary | ICD-10-CM

## 2018-06-09 DIAGNOSIS — G8929 Other chronic pain: Secondary | ICD-10-CM | POA: Insufficient documentation

## 2018-06-09 DIAGNOSIS — Z96643 Presence of artificial hip joint, bilateral: Secondary | ICD-10-CM | POA: Diagnosis not present

## 2018-06-09 DIAGNOSIS — R1319 Other dysphagia: Secondary | ICD-10-CM

## 2018-06-09 HISTORY — PX: ESOPHAGOGASTRODUODENOSCOPY (EGD) WITH PROPOFOL: SHX5813

## 2018-06-09 HISTORY — PX: MALONEY DILATION: SHX5535

## 2018-06-09 SURGERY — ESOPHAGOGASTRODUODENOSCOPY (EGD) WITH PROPOFOL
Anesthesia: Monitor Anesthesia Care

## 2018-06-09 MED ORDER — PROPOFOL 10 MG/ML IV BOLUS
INTRAVENOUS | Status: AC
  Filled 2018-06-09: qty 40

## 2018-06-09 MED ORDER — HYDROMORPHONE HCL 1 MG/ML IJ SOLN: 0.2500 mg | INTRAMUSCULAR | Status: DC | PRN

## 2018-06-09 MED ORDER — PROPOFOL 500 MG/50ML IV EMUL
INTRAVENOUS | Status: DC | PRN
  Administered 2018-06-09: 150 ug/kg/min via INTRAVENOUS

## 2018-06-09 MED ORDER — HYDROCODONE-ACETAMINOPHEN 7.5-325 MG PO TABS: 1.0000 | ORAL_TABLET | Freq: Once | ORAL | Status: DC | PRN

## 2018-06-09 MED ORDER — LACTATED RINGERS IV SOLN: INTRAVENOUS | Status: DC

## 2018-06-09 MED ORDER — LACTATED RINGERS IV SOLN
INTRAVENOUS | Status: DC
  Administered 2018-06-09: 08:00:00 via INTRAVENOUS

## 2018-06-09 MED ORDER — CHLORHEXIDINE GLUCONATE CLOTH 2 % EX PADS: 6.0000 | MEDICATED_PAD | Freq: Once | CUTANEOUS | Status: DC

## 2018-06-09 MED ORDER — PROMETHAZINE HCL 25 MG/ML IJ SOLN: 6.2500 mg | INTRAMUSCULAR | Status: DC | PRN

## 2018-06-09 MED ORDER — KETAMINE HCL 10 MG/ML IJ SOLN
INTRAMUSCULAR | Status: DC | PRN
  Administered 2018-06-09 (×2): 10 mg via INTRAVENOUS

## 2018-06-09 MED ORDER — PROPOFOL 10 MG/ML IV BOLUS
INTRAVENOUS | Status: DC | PRN
  Administered 2018-06-09 (×2): 20 mg via INTRAVENOUS

## 2018-06-09 MED ORDER — KETAMINE HCL 50 MG/5ML IJ SOSY
PREFILLED_SYRINGE | INTRAMUSCULAR | Status: AC
  Filled 2018-06-09: qty 5

## 2018-06-09 MED ORDER — MIDAZOLAM HCL 2 MG/2ML IJ SOLN
INTRAMUSCULAR | Status: AC
  Filled 2018-06-09: qty 2

## 2018-06-09 MED ORDER — MEPERIDINE HCL 50 MG/ML IJ SOLN: 6.2500 mg | INTRAMUSCULAR | Status: DC | PRN

## 2018-06-09 NOTE — Anesthesia Postprocedure Evaluation (Signed)
Anesthesia Post Note  Patient: Charles [name]  Procedure(s) Performed: ESOPHAGOGASTRODUODENOSCOPY (EGD) WITH PROPOFOL (N/A ) MALONEY DILATION (N/A )  Patient location during evaluation: PACU Anesthesia Type: MAC Level of consciousness: awake and alert Pain management: pain level controlled Vital Signs Assessment: post-procedure vital signs reviewed and stable Respiratory status: spontaneous breathing Cardiovascular status: stable Postop Assessment: no apparent nausea or vomiting Anesthetic complications: no     Last Vitals:  Vitals:   06/09/18 0735  BP: (!) 142/89  Pulse: 65  Resp: 12  Temp: (!) 36.4 C  SpO2: 95%    Last Pain:  Vitals:   06/09/18 0807  TempSrc:   PainSc: 0-No pain                 Jani Ploeger A

## 2018-06-09 NOTE — Interval H&P Note (Signed)
History and Physical Interval Note:  06/09/2018 7:51 AM  [name]  has presented today for surgery, with the diagnosis of dysphagia, abd pain, gerd.  The various methods of treatment have been discussed with the patient and family. After consideration of risks, benefits and other options for treatment, the patient has consented to  Procedure(s) with comments: ESOPHAGOGASTRODUODENOSCOPY (EGD) WITH PROPOFOL (N/A) - 8:00am MALONEY DILATION (N/A) as a surgical intervention.  The patient's history has been reviewed, patient examined, no change in status, stable for surgery.  I have reviewed the patient's chart and labs.  Questions were answered to the patient's satisfaction.     Charles [name]   No change.  EGD with ED as feasible/appropriate per plan.  The risks, benefits, limitations, alternatives and imponderables have been reviewed with the patient. Potential for esophageal dilation, biopsy, etc. have also been reviewed.  Questions have been answered. All parties agreeable.

## 2018-06-09 NOTE — Op Note (Signed)
[facility] Patient Name: Charles [name] Procedure Date: 06/09/2018 7:09 AM MRN: [id number] Date of Birth: Feb 21, 1970 Attending MD: Norvel Richards , MD CSN: [id number] Age: 48 Admit Type: Outpatient Procedure:                Upper GI endoscopy Indications:              Dysphagia, Heartburn Providers:                Norvel Richards, MD, Janeece Riggers, RN, Raphael Gibney, Technician, Randa Spike, Technician Referring MD:              Medicines:                Propofol per Anesthesia Complications:            No immediate complications. Estimated Blood Loss:     Estimated blood loss: none. Procedure:                Pre-Anesthesia Assessment:                           - Prior to the procedure, a History and Physical                            was performed, and patient medications and                            allergies were reviewed. The patient's tolerance of                            previous anesthesia was also reviewed. The risks                            and benefits of the procedure and the sedation                            options and risks were discussed with the patient.                            All questions were answered, and informed consent                            was obtained. Prior Anticoagulants: The patient has                            taken no previous anticoagulant or antiplatelet                            agents. ASA Grade Assessment: II - A patient with                            mild systemic disease. After reviewing the risks  and benefits, the patient was deemed in                            satisfactory condition to undergo the procedure.                           After obtaining informed consent, the endoscope was                            passed under direct vision. Throughout the                            procedure, the patient's blood pressure, pulse, and     oxygen saturations were monitored continuously. The                            GIF-H190 ([id number]) scope was introduced through the                            and advanced to the second part of duodenum. The                            upper GI endoscopy was accomplished without                            difficulty. The patient tolerated the procedure                            well. Scope In: 8:08:17 AM Scope Out: 8:13:58 AM Total Procedure Duration: 0 hours 5 minutes 41 seconds  Findings:      Esophagitis was found. Patulous EG junction. Noncritical appearing       Schatzki's ring. Multiple erosions straddling the GE junction. No       Barrett's epithelium. No mass. Small hiatal hernia. Patchy gastric       erythema. No ulcer or infiltrating process. The scope was withdrawn.       Dilation was performed with a Maloney dilator with mild resistance at 56       Fr. The dilation site was examined following endoscope reinsertion and       showed no change. Estimated blood loss: none.      The duodenal bulb and second portion of the duodenum were normal. Impression:               -Mild erosive reflux esophagitis. Noncritical                            Schatzki's ring. Dilated. Gastric erythema of                            doubtful clinical significance.                           - Normal duodenal bulb and second portion of the  duodenum.                           - No specimens collected. Moderate Sedation:      Moderate (conscious) sedation was administered by the endoscopy nurse       and supervised by the endoscopist. The following parameters were       monitored: oxygen saturation, heart rate, blood pressure, respiratory       rate, EKG, adequacy of pulmonary ventilation, and response to care. Recommendation:           - Patient has a contact number available for                            emergencies. The signs and symptoms of potential                             delayed complications were discussed with the                            patient. Return to normal activities tomorrow.                            Written discharge instructions were provided to the                            patient.                           - Resume previous diet.                           - Continue present medications. Increase Protonix                            to 40 mg twice daily. Lose 10 pounds in the next 3                            months office visit with [place] in 3 months                           - Procedure Code(s):        --- Professional ---                           [phone], Esophagogastroduodenoscopy, flexible,                            transoral; diagnostic, including collection of                            specimen(s) by brushing or washing, when performed                            (separate procedure)                           43450,  Dilation of esophagus, by unguided sound or                            bougie, single or multiple passes Diagnosis Code(s):        --- Professional ---                           K20.9, Esophagitis, unspecified                           R13.10, Dysphagia, unspecified                           R12, Heartburn CPT copyright 2019 American Medical Association. All rights reserved. The codes documented in this report are preliminary and upon coder review may  be revised to meet current compliance requirements. Cristopher Estimable. Jaxden Blyden, MD Norvel Richards, MD 06/09/2018 8:24:10 AM This report has been signed electronically. Number of Addenda: 0

## 2018-06-09 NOTE — Anesthesia Preprocedure Evaluation (Signed)
Anesthesia Evaluation    Airway Mallampati: II       Dental  (+) Teeth Intact   Pulmonary sleep apnea , COPD,  COPD inhaler, former smoker,     + decreased breath sounds      Cardiovascular  Rhythm:regular     Neuro/Psych  Headaches,  Neuromuscular disease    GI/Hepatic GERD  ,  Endo/Other  Hypothyroidism   Renal/GU      Musculoskeletal   Abdominal   Peds  Hematology   Anesthesia Other Findings COPD on inhalers Fit, works as Building control surveyor  Reproductive/Obstetrics                             Anesthesia Physical Anesthesia Plan  ASA: III  Anesthesia Plan: MAC   Post-op Pain Management:    Induction:   PONV Risk Score and Plan:   Airway Management Planned:   Additional Equipment:   Intra-op Plan:   Post-operative Plan:   Informed Consent: I have reviewed the patients History and Physical, chart, labs and discussed the procedure including the risks, benefits and alternatives for the proposed anesthesia with the patient or authorized representative who has indicated his/her understanding and acceptance.       Plan Discussed with: Anesthesiologist  Anesthesia Plan Comments:         Anesthesia Quick Evaluation

## 2018-06-09 NOTE — Discharge Instructions (Signed)
EGD Discharge instructions Please read the instructions outlined below and refer to this sheet in the next few weeks. These discharge instructions provide you with general information on caring for yourself after you leave the hospital. Your doctor may also give you specific instructions. While your treatment has been planned according to the most current medical practices available, unavoidable complications occasionally occur. If you have any problems or questions after discharge, please call your doctor. ACTIVITY  You may resume your regular activity but move at a slower pace for the next 24 hours.   Take frequent rest periods for the next 24 hours.   Walking will help expel (get rid of) the air and reduce the bloated feeling in your abdomen.   No driving for 24 hours (because of the anesthesia (medicine) used during the test).   You may shower.   Do not sign any important legal documents or operate any machinery for 24 hours (because of the anesthesia used during the test).  NUTRITION  Drink plenty of fluids.   You may resume your normal diet.   Begin with a light meal and progress to your normal diet.   Avoid alcoholic beverages for 24 hours or as instructed by your caregiver.  MEDICATIONS  You may resume your normal medications unless your caregiver tells you otherwise.  WHAT YOU CAN EXPECT TODAY  You may experience abdominal discomfort such as a feeling of fullness or gas pains.  FOLLOW-UP  Your doctor will discuss the results of your test with you.  SEEK IMMEDIATE MEDICAL ATTENTION IF ANY OF THE FOLLOWING OCCUR:  Excessive nausea (feeling sick to your stomach) and/or vomiting.   Severe abdominal pain and distention (swelling).   Trouble swallowing.   Temperature over 101 F (37.8 C).   Rectal bleeding or vomiting of blood.    GERD information provided  Increase Protonix to 40 mg twice daily  10 pounds in the next 3 months  Office visit with [place] in 3  months  At patient request, I spoke to his wife Nira Conn at [phone] regarding impression and recommendations.     Gastroesophageal Reflux Disease, Adult Gastroesophageal reflux (GER) happens when acid from the stomach flows up into the tube that connects the mouth and the stomach (esophagus). Normally, food travels down the esophagus and stays in the stomach to be digested. With GER, food and stomach acid sometimes move back up into the esophagus. You may have a disease called gastroesophageal reflux disease (GERD) if the reflux:  Happens often.  Causes frequent or very bad symptoms.  Causes problems such as damage to the esophagus. When this happens, the esophagus becomes sore and swollen (inflamed). Over time, GERD can make small holes (ulcers) in the lining of the esophagus. What are the causes? This condition is caused by a problem with the muscle between the esophagus and the stomach. When this muscle is weak or not normal, it does not close properly to keep food and acid from coming back up from the stomach. The muscle can be weak because of:  Tobacco use.  Pregnancy.  Having a certain type of hernia (hiatal hernia).  Alcohol use.  Certain foods and drinks, such as coffee, chocolate, onions, and peppermint. What increases the risk? You are more likely to develop this condition if you:  Are overweight.  Have a disease that affects your connective tissue.  Use NSAID medicines. What are the signs or symptoms? Symptoms of this condition include:  Heartburn.  Difficult or painful swallowing.  The feeling of having a lump in the throat.  A bitter taste in the mouth.  Bad breath.  Having a lot of saliva.  Having an upset or bloated stomach.  Belching.  Chest pain. Different conditions can cause chest pain. Make sure you see your doctor if you have chest pain.  Shortness of breath or noisy breathing (wheezing).  Ongoing (chronic) cough or a cough at  night.  Wearing away of the surface of teeth (tooth enamel).  Weight loss. How is this treated? Treatment will depend on how bad your symptoms are. Your doctor may suggest:  Changes to your diet.  Medicine.  Surgery. Follow these instructions at home: Eating and drinking   Follow a diet as told by your doctor. You may need to avoid foods and drinks such as: ? Coffee and tea (with or without caffeine). ? Drinks that contain alcohol. ? Energy drinks and sports drinks. ? Bubbly (carbonated) drinks or sodas. ? Chocolate and cocoa. ? Peppermint and mint flavorings. ? Garlic and onions. ? Horseradish. ? Spicy and acidic foods. These include peppers, chili powder, curry powder, vinegar, hot sauces, and BBQ sauce. ? Citrus fruit juices and citrus fruits, such as oranges, lemons, and limes. ? Tomato-based foods. These include red sauce, chili, salsa, and pizza with red sauce. ? Fried and fatty foods. These include donuts, french fries, potato chips, and high-fat dressings. ? High-fat meats. These include hot dogs, rib eye steak, sausage, ham, and bacon. ? High-fat dairy items, such as whole milk, butter, and cream cheese.  Eat small meals often. Avoid eating large meals.  Avoid drinking large amounts of liquid with your meals.  Avoid eating meals during the 2-3 hours before bedtime.  Avoid lying down right after you eat.  Do not exercise right after you eat. Lifestyle   Do not use any products that contain nicotine or tobacco. These include cigarettes, e-cigarettes, and chewing tobacco. If you need help quitting, ask your doctor.  Try to lower your stress. If you need help doing this, ask your doctor.  If you are overweight, lose an amount of weight that is healthy for you. Ask your doctor about a safe weight loss goal. General instructions  Pay attention to any changes in your symptoms.  Take over-the-counter and prescription medicines only as told by your doctor. Do not  take aspirin, ibuprofen, or other NSAIDs unless your doctor says it is okay.  Wear loose clothes. Do not wear anything tight around your waist.  Raise (elevate) the head of your bed about 6 inches (15 cm).  Avoid bending over if this makes your symptoms worse.  Keep all follow-up visits as told by your doctor. This is important. Contact a doctor if:  You have new symptoms.  You lose weight and you do not know why.  You have trouble swallowing or it hurts to swallow.  You have wheezing or a cough that keeps happening.  Your symptoms do not get better with treatment.  You have a hoarse voice. Get help right away if:  You have pain in your arms, neck, jaw, teeth, or back.  You feel sweaty, dizzy, or light-headed.  You have chest pain or shortness of breath.  You throw up (vomit) and your throw-up looks like blood or coffee grounds.  You pass out (faint).  Your poop (stool) is bloody or black.  You cannot swallow, drink, or eat. Summary  If a person has gastroesophageal reflux disease (GERD), food and stomach acid move  back up into the esophagus and cause symptoms or problems such as damage to the esophagus.  Treatment will depend on how bad your symptoms are.  Follow a diet as told by your doctor.  Take all medicines only as told by your doctor. This information is not intended to replace advice given to you by your health care provider. Make sure you discuss any questions you have with your health care provider. Document Released: 06/10/2007 Document Revised: 06/30/2017 Document Reviewed: 06/30/2017 Elsevier Interactive Patient Education  2019 [employer].

## 2018-06-09 NOTE — Transfer of Care (Signed)
Immediate Anesthesia Transfer of Care Note  Patient: Charles [name]  Procedure(s) Performed: ESOPHAGOGASTRODUODENOSCOPY (EGD) WITH PROPOFOL (N/A ) MALONEY DILATION (N/A )  Patient Location: PACU  Anesthesia Type:MAC  Level of Consciousness: awake, alert , oriented and patient cooperative  Airway & Oxygen Therapy: Patient Spontanous Breathing and Patient connected to nasal cannula oxygen  Post-op Assessment: Report given to RN and Post -op Vital signs reviewed and stable  Post vital signs: Reviewed and stable  Last Vitals:  Vitals Value Taken Time  BP    Temp    Pulse 102 06/09/2018  8:20 AM  Resp 22 06/09/2018  8:20 AM  SpO2 94 % 06/09/2018  8:20 AM  Vitals shown include unvalidated device data.  Last Pain:  Vitals:   06/09/18 0807  TempSrc:   PainSc: 0-No pain         Complications: No apparent anesthesia complications

## 2018-06-09 NOTE — Anesthesia Procedure Notes (Signed)
Procedure Name: [place] Performed by: Andree Elk Lira Stephen A, CRNA Pre-anesthesia Checklist: Patient identified, Emergency Drugs available, Suction available, Timeout performed and Patient being monitored Patient Re-evaluated:Patient Re-evaluated prior to induction Oxygen Delivery Method: Nasal Cannula

## 2018-06-16 ENCOUNTER — Encounter (HOSPITAL_COMMUNITY): Payer: Self-pay | Admitting: Internal Medicine

## 2018-07-07 ENCOUNTER — Ambulatory Visit (INDEPENDENT_AMBULATORY_CARE_PROVIDER_SITE_OTHER): Payer: Managed Care, Other (non HMO) | Admitting: Rheumatology

## 2018-07-07 ENCOUNTER — Encounter: Payer: Self-pay | Admitting: Rheumatology

## 2018-07-07 ENCOUNTER — Other Ambulatory Visit: Payer: Self-pay

## 2018-07-07 ENCOUNTER — Telehealth: Payer: Self-pay | Admitting: Pharmacy Technician

## 2018-07-07 VITALS — BP 126/78 | HR 64 | Resp 15 | Ht 72.0 in | Wt 203.6 lb

## 2018-07-07 DIAGNOSIS — F5101 Primary insomnia: Secondary | ICD-10-CM

## 2018-07-07 DIAGNOSIS — M5136 Other intervertebral disc degeneration, lumbar region: Secondary | ICD-10-CM

## 2018-07-07 DIAGNOSIS — M5030 Other cervical disc degeneration, unspecified cervical region: Secondary | ICD-10-CM

## 2018-07-07 DIAGNOSIS — Z79899 Other long term (current) drug therapy: Secondary | ICD-10-CM | POA: Diagnosis not present

## 2018-07-07 DIAGNOSIS — M461 Sacroiliitis, not elsewhere classified: Secondary | ICD-10-CM

## 2018-07-07 DIAGNOSIS — L4050 Arthropathic psoriasis, unspecified: Secondary | ICD-10-CM

## 2018-07-07 DIAGNOSIS — L409 Psoriasis, unspecified: Secondary | ICD-10-CM | POA: Diagnosis not present

## 2018-07-07 DIAGNOSIS — Z96643 Presence of artificial hip joint, bilateral: Secondary | ICD-10-CM

## 2018-07-07 MED ORDER — LIDOCAINE HCL 1 % IJ SOLN
1.0000 mL | INTRAMUSCULAR | Status: AC | PRN
  Administered 2018-07-07: 1 mL

## 2018-07-07 MED ORDER — SECUKINUMAB (300 MG DOSE) 150 MG/ML ~~LOC~~ SOAJ
300.0000 mg | Freq: Once | SUBCUTANEOUS | Status: AC
  Administered 2018-07-07: 300 mg via SUBCUTANEOUS

## 2018-07-07 MED ORDER — TRIAMCINOLONE ACETONIDE 40 MG/ML IJ SUSP
40.0000 mg | INTRAMUSCULAR | Status: AC | PRN
  Administered 2018-07-07: 40 mg via INTRA_ARTICULAR

## 2018-07-07 NOTE — Telephone Encounter (Signed)
Patient voiced issues with his copay card and the pharmacy. Called [facility] and Rep Hilda Blades, said they have a pharmacy copay card and a visa copay card on file. The Visa card is not working. Patient will need to call [employer] to request funds to be added to his Visa or to see why his [place] is not working. [employer] will only speak to the patient directly.   The pharmacy card paid $500 of patient's copay. Patient balance to be billed to [facility] is $1,545 (declined).   [employer] copay card- [phone]  [facility]- [phone]  9:50 AM Beatriz Chancellor, CPhT

## 2018-07-07 NOTE — Progress Notes (Signed)
Office Visit Note  Patient: Charles [name]             Date of Birth: March 04, 1970           MRN: [id number]             PCP: Celene Squibb, MD Referring: Celene Squibb, MD Visit Date: 07/07/2018 Occupation: '@GUAROCC'$ @  Subjective:  Lower back pain.   History of Present Illness: Charles [name] is a 48 y.o. male with history of psoriatic arthritis and DDD. He is taking Cosentyx 300 mg every 28 days. He also takes gabapentin and percocet not prescribed by our office.  Patient states that he missed 2 doses of Cosentyx and he has been having increased pain and discomfort.  He is having severe pain and discomfort in his bilateral SI joints and his lower extremities.  He states he had to miss work yesterday due to pain.  He denies any joint swelling.  Flare of psoriasis.   Activities of Daily Living:  Patient reports morning stiffness for 0 minutes.   Patient Reports nocturnal pain.  Difficulty dressing/grooming: Denies Difficulty climbing stairs: Denies Difficulty getting out of chair: Reports Difficulty using hands for taps, buttons, cutlery, and/or writing: Denies  Review of Systems  Constitutional: Positive for fatigue. Negative for night sweats.  HENT: Negative for mouth sores, mouth dryness and nose dryness.   Eyes: Negative for redness, itching and dryness.  Respiratory: Negative for shortness of breath and difficulty breathing.   Cardiovascular: Negative for chest pain, palpitations, hypertension, irregular heartbeat and swelling in legs/feet.  Gastrointestinal: Negative for blood in stool, constipation and diarrhea.  Endocrine: Negative for increased urination.  Genitourinary: Negative for painful urination and pelvic pain.  Musculoskeletal: Positive for arthralgias, gait problem and joint pain. Negative for joint swelling, myalgias, muscle weakness, morning stiffness, muscle tenderness and myalgias.  Skin: Negative for color change, rash, hair loss, nodules/bumps, redness, skin  tightness, ulcers and sensitivity to sunlight.  Allergic/Immunologic: Negative for susceptible to infections.  Neurological: Positive for numbness. Negative for dizziness, fainting, light-headedness, headaches, memory loss, night sweats and weakness.  Hematological: Negative for swollen glands.  Psychiatric/Behavioral: Positive for sleep disturbance. Negative for depressed mood and confusion. The patient is not nervous/anxious.     PMFS History:  Patient Active Problem List   Diagnosis Date Noted  . GERD (gastroesophageal reflux disease) 05/25/2018  . Dysphagia 05/25/2018  . Acute left-sided low back pain with left-sided sciatica 02/01/2018  . Pain in left hip 02/01/2018  . Constipation 05/11/2017  . Chronic right-sided low back pain with right-sided sciatica 04/05/2017  . 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 Bilateral sacroiliitis ([place]) 05/22/2016  . Psoriatic arthritis ([place]) 05/22/2016  . High risk medications (not anticoagulants) long-term use 05/22/2016  . DJD (degenerative joint disease), cervical 05/22/2016  . Osteoarthritis of spine with radiculopathy, lumbar region 05/22/2016  . Cephalalgia 03/16/2016  . Chronic daily headache 03/16/2016  . Neck pain 03/16/2016  . Hemorrhoids 03/13/2016  . 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 Insomnia 12/26/2015  . Abdominal pain, epigastric 11/13/2015  . Dark stools 11/13/2015  . Rectal bleeding 11/13/2015  . Numbness 09/26/2015  . 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 Avascular necrosis of left femoral head ([place]) 07/30/2015  . Avascular necrosis of right femoral head ([place]) 07/30/2015  . Psoriasis 07/30/2015  . Chronic obstructive pulmonary disease (COPD) ([place]) 07/30/2015  . Status post total replacement of left  hip 07/30/2015    Past Medical History:  Diagnosis Date  . Arthritis    also has avascular necrosis   . Chronic back pain   .  Chronic neck pain   . Chronic shoulder pain   . COPD (chronic obstructive pulmonary disease) ([place])   . GERD (gastroesophageal reflux disease)    tums  OTC  medication  . Headache   . Hypothyroidism   . Psoriatic arthritis ([place])   . Sleep apnea    tested more than 5 yrs ago...negative results    Family History  Problem Relation Age of Onset  . Heart failure Mother   . Diabetes Mother   . Hypertension Mother   . Heart failure Father   . Prostate cancer Father   . Arthritis Father   . Hypertension Sister   . Heart attack Sister   . Hypercholesterolemia Sister   . Healthy Daughter   . Colon cancer Neg Hx   . Gastric cancer Neg Hx   . Esophageal cancer Neg Hx    Past Surgical History:  Procedure Laterality Date  . COLONOSCOPY WITH PROPOFOL N/A 12/09/2015   Procedure: COLONOSCOPY WITH PROPOFOL;  Surgeon: Daneil Dolin, MD;  [facility];  Service: Endoscopy;  Laterality: N/A;  1100  . ESOPHAGOGASTRODUODENOSCOPY (EGD) WITH PROPOFOL N/A 12/09/2015   Procedure: ESOPHAGOGASTRODUODENOSCOPY (EGD) WITH PROPOFOL;  Surgeon: Daneil Dolin, MD;  [facility];  Service: Endoscopy;  Laterality: N/A;  . ESOPHAGOGASTRODUODENOSCOPY (EGD) WITH PROPOFOL N/A 06/09/2018   Procedure: ESOPHAGOGASTRODUODENOSCOPY (EGD) WITH PROPOFOL;  Surgeon: Daneil Dolin, MD;  [facility];  Service: Endoscopy;  Laterality: N/A;  8:00am  . JOINT REPLACEMENT     left hip 2017  . MALONEY DILATION N/A 06/09/2018   Procedure: Venia Minks DILATION;  Surgeon: Daneil Dolin, MD;  [facility];  Service: Endoscopy;  Laterality: N/A;  . NECK SURGERY    . SHOULDER SURGERY    . TOTAL HIP ARTHROPLASTY Left 07/30/2015   Procedure: TOTAL HIP ARTHROPLASTY;  Surgeon: Garald Balding, MD;  Location: [facility];  Service: Orthopedics;  Laterality: Left;  . TOTAL HIP ARTHROPLASTY Right 09/22/2016  . TOTAL HIP ARTHROPLASTY Right 09/22/2016   Procedure: RIGHT TOTAL HIP ARTHROPLASTY;  Surgeon:  Garald Balding, MD;  Location: [facility];  Service: Orthopedics;  Laterality: Right;   Social History   Social History Narrative  . Not on file   Immunization History  Administered Date(s) Administered  . Pneumococcal Polysaccharide-23 09/23/2016  . Tdap 04/16/2016     Objective: Vital Signs: BP 126/78   Pulse 64   Resp 15   Ht 6' (1.829 m)   Wt 203 lb 9.6 oz (92.4 kg)   BMI 27.61 kg/m    Physical Exam Vitals signs and nursing note reviewed.  Constitutional:      Appearance: He is well-developed.  HENT:     Head: Normocephalic and atraumatic.  Eyes:     Conjunctiva/sclera: Conjunctivae normal.     Pupils: Pupils are equal, round, and reactive to light.  Neck:     Musculoskeletal: Normal range of motion and neck supple.  Cardiovascular:     Rate and Rhythm: Normal rate and regular rhythm.     Heart sounds: Normal heart sounds.  Pulmonary:     Effort: Pulmonary effort is normal.     Breath sounds: Normal breath sounds.  Abdominal:     General: Bowel sounds are normal.     Palpations: Abdomen is  soft.  Skin:    General: Skin is warm and dry.     Capillary Refill: Capillary refill takes less than 2 seconds.  Neurological:     Mental Status: He is alert and oriented to person, place, and time.  Psychiatric:        Behavior: Behavior normal.      Musculoskeletal Exam: C-spine was good range of motion.  He had discomfort range of motion of his lumbar spine.  He had tenderness over bilateral SI joints.  Shoulder joints, elbow joints, wrist joints, MCPs, DIPs and PIPs were in good range of motion with no synovitis.  Hip joints, knee joints, ankles, MTPs and PIPs been good range of motion with no synovitis.  There was no evidence of plantar fasciitis or Achilles tendinitis.  CDAI Exam: CDAI Score: 1.4  Patient Global: 8 mm; Provider Global: 6 mm Swollen: 0 ; Tender: 2  Joint Exam      Right  Left  Sacroiliac   Tender   Tender     Investigation: No additional  findings.  Imaging: No results found.  Recent Labs: Lab Results  Component Value Date   WBC 7.0 02/23/2018   HGB 15.2 02/23/2018   PLT 186 02/23/2018   NA 139 02/23/2018   K 4.5 02/23/2018   CL 103 02/23/2018   CO2 26 02/23/2018   GLUCOSE 88 02/23/2018   BUN 16 02/23/2018   CREATININE 0.87 02/23/2018   BILITOT 0.9 02/23/2018   ALKPHOS 44 09/10/2016   AST 15 02/23/2018   ALT 12 02/23/2018   PROT 6.8 02/23/2018   ALBUMIN 4.4 09/10/2016   CALCIUM 9.8 02/23/2018   GFRAA 121 02/23/2018   QFTBGOLDPLUS NEGATIVE 02/23/2018    Speciality Comments: No specialty comments available.  Procedures:  Sacroiliac Joint Inj on 07/07/2018 9:54 AM Indications: pain Details: 27 G 1.5 in needle, posterior approach Medications (Right): 1 mL lidocaine 1 %; 40 mg triamcinolone acetonide 40 MG/ML Aspirate (Right): 0 mL Medications (Left): 1 mL lidocaine 1 %; 40 mg triamcinolone acetonide 40 MG/ML Aspirate (Left): 0 mL Outcome: tolerated well, no immediate complications Procedure, treatment alternatives, risks and benefits explained, specific risks discussed. Consent was given by the patient. Immediately prior to procedure a time out was called to verify the correct patient, procedure, equipment, support staff and site/side marked as required. Patient was prepped and draped in the usual sterile fashion.     Allergies: Patient has no known allergies.   Assessment / Plan:     Visit Diagnoses: Psoriatic arthritis ([place]) -patient is having a flare of psoriatic arthritis as he has been off Cosentyx for the last 2 months.  He has been having issues with insurance.  We gave him 2 sample injections today.  He took a dose of Cosentyx 300 mg in the office today.  I will check ESR today.  He also missed work yesterday due to a flare.  We will give him a work excuse for today and yesterday.  Psoriasis -he has no active psoriasis today.  High risk medications (not anticoagulants) long-term use -Cosentyx 300  mg every 28 days.  Last TB gold negative on 02/23/2018 and will monitor yearly.  Most recent CBC/CMP within normal limits on 02/23/2017.  He is overdue for labs.  CBC/CMP ordered for today and will monitor every 3 months.  Standing orders are in place.  He is received the Pneumovax 23 vaccine.  Recommend annual flu and Prevnar 13 as indicated for immunosuppressant therapy.- Plan: CBC with Differential/Platelet,  COMPLETE METABOLIC PANEL WITH GFR, today and every 3 months.  Bilateral sacroiliitis ([place]) -he was having increased pain and discomfort in bilateral SI joints.  After informed consent was obtained bilateral SI joints were injected with cortisone as described above.     DDD (degenerative disc disease), cervical -chronic pain  DDD (degenerative disc disease), lumbar -patient is chronic pain and gets medications through his PCP.  History of total replacement of both hip joints -he had good range of motion without much discomfort.  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   Other medical conditions are listed as follows:   History of gastroesophageal reflux (GERD)  History of COPD  Smoker  History of hypothyroidism  History of depression  Orders: Orders Placed This Encounter  Procedures  . Sacroiliac Joint Inj  . CBC with Differential/Platelet  . COMPLETE METABOLIC PANEL WITH GFR  . Sedimentation rate   Meds ordered this encounter  Medications  . Secukinumab (300 MG Dose) SOAJ 300 mg      Follow-Up Instructions: Return in about 5 months (around 12/07/2018) for Psoriatic arthritis.   Bo Merino, MD  Note - This record has been created using Editor, commissioning.  Chart creation errors have been sought, but may not always  have been located. Such creation errors do not reflect on  the standard of medical care.

## 2018-07-07 NOTE — Patient Instructions (Addendum)
Please call about your Cosentyx visa debit card at [phone] to request fund to cover your balance with [facility].  They should not require you to pay and then be reimbursed.  Your current balance at [facility] is  $1,545  Standing Labs We placed an order today for your standing lab work.    Please come back and get your standing labs in October and every 3 months.  We have open lab daily Monday through Thursday from 8:30-12:30 PM and 1:30-4:30 PM and Friday from 8:30-12:30 PM and 1:30 -4:00 PM at the office of Dr. Bo Merino.   You may experience shorter wait times on Monday and Friday afternoons. The office is located at [address], [place], [place], [place] [address] No appointment is necessary.   Labs are drawn by [facility].  You may receive a bill from [facility] for your lab work.  If you wish to have your labs drawn at another location, please call the office 24 hours in advance to send orders.  If you have any questions regarding directions or hours of operation,  please call [phone].   Just as a reminder please drink plenty of water prior to coming for your lab work. Thanks!

## 2018-07-07 NOTE — Progress Notes (Signed)
Patient received Cosentyx injection in office today. Patient given injection in right and left arm.  Patient tolerated injection well. Patient monitored for adverse reaction noted.     Administrations This Visit    Secukinumab (300 MG Dose) SOAJ 300 mg    Admin Date 07/07/2018 Action Given Dose 300 mg Route Subcutaneous Administered By Carole Binning, LPN

## 2018-07-07 NOTE — Telephone Encounter (Signed)
Patient notified in office and verbalized understanding.  Medication Samples have been provided to the patient.  Drug name: Cosentyx  Sensoready Pens   Strength: 150 mg/ml Qty: 2   LOT: [id number]   Exp.Date: 08/2019  Dosing instructions: Inject 2 pens every 28 days  The patient has been instructed regarding the correct time, dose, and frequency of taking this medication, including desired effects and most common side effects.   Navina Wohlers C Aislin Onofre 10:16 AM 07/07/2018

## 2018-07-08 LAB — COMPLETE METABOLIC PANEL WITH GFR
AG Ratio: 2 (calc) (ref 1.0–2.5)
ALT: 15 U/L (ref 9–46)
AST: 18 U/L (ref 10–40)
Albumin: 4.7 g/dL (ref 3.6–5.1)
Alkaline phosphatase (APISO): 44 U/L (ref 36–130)
BUN: 13 mg/dL (ref 7–25)
CO2: 25 mmol/L (ref 20–32)
Calcium: 9.8 mg/dL (ref 8.6–10.3)
Chloride: 107 mmol/L (ref 98–110)
Creat: 0.91 mg/dL (ref 0.60–1.35)
GFR, Est African American: 117 mL/min/{1.73_m2} (ref >=60)
GFR, Est Non African American: 101 mL/min/{1.73_m2} (ref >=60)
Globulin: 2.3 g/dL (calc) (ref 1.9–3.7)
Glucose, Bld: 94 mg/dL (ref 65–99)
Potassium: 4.6 mmol/L (ref 3.5–5.3)
Sodium: 140 mmol/L (ref 135–146)
Total Bilirubin: 0.6 mg/dL (ref 0.2–1.2)
Total Protein: 7 g/dL (ref 6.1–8.1)

## 2018-07-08 LAB — CBC WITH DIFFERENTIAL/PLATELET
Absolute Monocytes: 460 cells/uL (ref 200–950)
Basophils Absolute: 41 cells/uL (ref 0–200)
Basophils Relative: 0.7 %
Eosinophils Absolute: 142 cells/uL (ref 15–500)
Eosinophils Relative: 2.4 %
HCT: 44.6 % (ref 38.5–50.0)
Hemoglobin: 15.6 g/dL (ref 13.2–17.1)
Lymphs Abs: 1446 cells/uL (ref 850–3900)
MCH: 33.4 pg — ABNORMAL HIGH (ref 27.0–33.0)
MCHC: 35 g/dL (ref 32.0–36.0)
MCV: 95.5 fL (ref 80.0–100.0)
MPV: 12.1 fL (ref 7.5–12.5)
Monocytes Relative: 7.8 %
Neutro Abs: 3811 cells/uL (ref 1500–7800)
Neutrophils Relative %: 64.6 %
Platelets: 179 10*3/uL (ref 140–400)
RBC: 4.67 10*6/uL (ref 4.20–5.80)
RDW: 12.4 % (ref 11.0–15.0)
Total Lymphocyte: 24.5 %
WBC: 5.9 10*3/uL (ref 3.8–10.8)

## 2018-07-08 LAB — SEDIMENTATION RATE: Sed Rate: 2 mm/h (ref 0–15)

## 2018-07-14 NOTE — Progress Notes (Signed)
Referring Provider: Celene Squibb, MD Primary Care Physician:  Celene Squibb, MD  Primary GI: [place]  Chief Complaint  Patient presents with  . Hemorrhoids    bleeding    HPI:   Charles [name] is a 48 y.o. male presenting today with GI history significant for GERD, erosive reflux esophagitis, tubular adenoma, and internal hemorrhoids.    Last visit via [facility] on 05/25/18 for GERD controlled on Protonix, epigastric pain, and dysphagia. Also reporting scan toilet tissue hematochezia with known hemorrhoids. Scheduled for EGD at that time.   EGD 06/09/18: Mild erosive reflux esophagitis. Noncritical Schatzki's ring. Dilated. Gastric erythema of doubtful clinical significance. Normal duodenal bulb and second portion of the duodenum. No specimens collected. Small hiatal hernia. Recommended to increase Protonix to BID and weight loss.   Last Colonoscopy Dec 2017 with four 5 mm polyps in sigmoid colon and descending colon. Pathology revealed tubular adenomas. Recommended repeat in 5 years.  Hemorrhoid banding on 04/14/16 of left lateral column and 05/19/16 of right anterior column that resolved his hemorrhoid symptoms. Was seen on 05/11/2017 with recurrent hemorrhoid symptoms and tissue hematochezia. Recommended adding fiber supplement back and starting colace daily. Note with plans to pursue banding, but no visit thereafter for banding.   Today he states he has had on and off tissue hematochezia for the last month with associated burning due to his hemorrhoids. No itching. Usually a little blood in the morning after his BM. If he has multiple BMs a day, he will have a little more bleeding, but still only on toilet tissue. No blood on stool or in water. Uses suppositories, which helps some. Feels like tissue is prolapsing out at times. Similar symptoms before his hemorrhoid bandings in 2018. No melena. BMs daily, no diarrhea, rare constipation. Will take Metamucil occasionally for 3-4 days at a time if he  starts to get constiped. Admits to heavy lifting at work.   No abdominal pain. Admits to breakthrough reflux about twice a week. Associated with acidic or spicy foods or laying down at night typically. No nausea/vomiting. No unintentional weight loss. Appetite is good.   Denies fever, chills, lightheadedness, dizziness, pre-syncope, syncope. Admits to fatigue. He has known hypothyroidism for which he is no longer taking medication. Advised he follow-up with his PCP on this.   Past Medical History:  Diagnosis Date  . Arthritis    also has avascular necrosis   . Chronic back pain   . Chronic neck pain   . Chronic shoulder pain   . COPD (chronic obstructive pulmonary disease) ([place])   . GERD (gastroesophageal reflux disease)    tums  OTC  medication  . Headache   . HTN (hypertension)   . Hypothyroidism   . Psoriatic arthritis ([place])   . Sleep apnea    tested more than 5 yrs ago...negative results    Past Surgical History:  Procedure Laterality Date  . COLONOSCOPY WITH PROPOFOL N/A 12/09/2015   Procedure: COLONOSCOPY WITH PROPOFOL;  Surgeon: Daneil Dolin, MD;  [facility];  Service: Endoscopy;  Laterality: N/A;  1100  . ESOPHAGOGASTRODUODENOSCOPY (EGD) WITH PROPOFOL N/A 12/09/2015   Procedure: ESOPHAGOGASTRODUODENOSCOPY (EGD) WITH PROPOFOL;  Surgeon: Daneil Dolin, MD;  [facility];  Service: Endoscopy;  Laterality: N/A;  . ESOPHAGOGASTRODUODENOSCOPY (EGD) WITH PROPOFOL N/A 06/09/2018   Procedure: ESOPHAGOGASTRODUODENOSCOPY (EGD) WITH PROPOFOL;  Surgeon: Daneil Dolin, MD;  [facility];  Service: Endoscopy;  Laterality: N/A;  8:00am  .  JOINT REPLACEMENT     left hip 2017  . MALONEY DILATION N/A 06/09/2018   Procedure: Venia Minks DILATION;  Surgeon: Daneil Dolin, MD;  [facility];  Service: Endoscopy;  Laterality: N/A;  . NECK SURGERY    . SHOULDER SURGERY    . TOTAL HIP ARTHROPLASTY Left 07/30/2015   Procedure: TOTAL HIP ARTHROPLASTY;   Surgeon: Garald Balding, MD;  Location: [facility];  Service: Orthopedics;  Laterality: Left;  . TOTAL HIP ARTHROPLASTY Right 09/22/2016  . TOTAL HIP ARTHROPLASTY Right 09/22/2016   Procedure: RIGHT TOTAL HIP ARTHROPLASTY;  Surgeon: Garald Balding, MD;  Location: [facility];  Service: Orthopedics;  Laterality: Right;    Current Outpatient Medications  Medication Sig Dispense Refill  . albuterol (PROVENTIL HFA;VENTOLIN HFA) 108 (90 Base) MCG/ACT inhaler Inhale 1-2 puffs into the lungs every 4 (four) hours as needed for wheezing or shortness of breath.     . cyclobenzaprine (FLEXERIL) 5 MG tablet Take 5 mg by mouth 3 (three) times daily as needed for muscle spasms.     Marland Kitchen gabapentin (NEURONTIN) 300 MG capsule Take 1 tablet at bedtime (Patient taking differently: Take 300 mg by mouth at bedtime. Take 1 tablet at bedtime) 30 capsule 1  . Multiple Vitamins-Minerals (MULTIVITAMIN WITH MINERALS) tablet Take 1 tablet by mouth daily.    Marland Kitchen oxyCODONE-acetaminophen (PERCOCET/ROXICET) 5-325 MG tablet Take 1-2 tablets by mouth every 4 (four) hours as needed for pain.     . pantoprazole (PROTONIX) 40 MG tablet Take 40 mg by mouth daily.  0  . Secukinumab, 300 MG Dose, (COSENTYX SENSOREADY, 300 MG,) 150 MG/ML SOAJ Inject 300 mg into the skin every 30 (thirty) days. 2 pen 0   No current facility-administered medications for this visit.     Allergies as of 07/15/2018  . (No Known Allergies)    Family History  Problem Relation Age of Onset  . Heart failure Mother   . Diabetes Mother   . Hypertension Mother   . Heart failure Father   . Prostate cancer Father   . Arthritis Father   . Hypertension Sister   . Heart attack Sister   . Hypercholesterolemia Sister   . Healthy Daughter   . Colon cancer Neg Hx   . Gastric cancer Neg Hx   . Esophageal cancer Neg Hx     Social History   Socioeconomic History  . Marital status: Married    Spouse name: Not on file  . Number of children: Not on file  . Years  of education: Not on file  . Highest education level: Not on file  Occupational History  . Not on file  Social Needs  . Financial resource strain: Not on file  . Food insecurity    Worry: Not on file    Inability: Not on file  . Transportation needs    Medical: Not on file    Non-medical: Not on file  Tobacco Use  . Smoking status: Current Every Day Smoker    Packs/day: 0.50    Years: 25.00    Pack years: 12.50    Types: Cigarettes  . Smokeless tobacco: Never Used  Substance and Sexual Activity  . Alcohol use: Yes    Alcohol/week: 7.0 standard drinks    Types: 7 Cans of beer per week    Comment: occ  . Drug use: No    Comment: states quit  . Sexual activity: Not on file  Lifestyle  . Physical activity  Days per week: Not on file    Minutes per session: Not on file  . Stress: Not on file  Relationships  . Social Herbalist on phone: Not on file    Gets together: Not on file    Attends religious service: Not on file    Active member of club or organization: Not on file    Attends meetings of clubs or organizations: Not on file    Relationship status: Not on file  Other Topics Concern  . Not on file  Social History Narrative  . Not on file    Review of Systems: Gen: See HPI CV: Denies chest pain, palpitations, peripheral edema. Resp: Denies dyspnea at rest, cough, wheezing. GI: See HPI Derm: Denies rash, itching, dry skin Psych: Denies depression, anxiety Heme: Denies bruising, bleeding  Physical Exam: BP 133/85   Pulse 66   Temp 97.7 F (36.5 C) (Oral)   Ht 6' (1.829 m)   Wt 196 lb 12.8 oz (89.3 kg)   BMI 26.69 kg/m  General:   Alert and oriented. No distress noted. Pleasant and cooperative.  Head:  Normocephalic and atraumatic. Eyes:  Conjuctiva clear without scleral icterus. Heart:  S1, S2 present without murmurs appreciated. Lungs:  Clear to auscultation bilaterally. No wheezes, rales, or rhonchi. No distress.  Abdomen:  +BS, soft,  non-tender and non-distended. No rebound or guarding. No HSM or masses noted. Rectal: External exam without abnormalities. Internal exam with posterior hemorrhoid appreciated.  Msk:  Symmetrical without gross deformities. Normal posture. Somewhat limited movement in hips. History of bilateral hip replacements.  Extremities:  Without edema. Neurologic:  Alert and  oriented x4 Psych:  Alert and cooperative. Normal mood and affect.

## 2018-07-15 ENCOUNTER — Encounter: Payer: Self-pay | Admitting: Internal Medicine

## 2018-07-15 ENCOUNTER — Other Ambulatory Visit: Payer: Self-pay

## 2018-07-15 ENCOUNTER — Encounter: Payer: Self-pay | Admitting: Gastroenterology

## 2018-07-15 ENCOUNTER — Ambulatory Visit: Payer: Managed Care, Other (non HMO) | Admitting: Gastroenterology

## 2018-07-15 DIAGNOSIS — K219 Gastro-esophageal reflux disease without esophagitis: Secondary | ICD-10-CM | POA: Diagnosis not present

## 2018-07-15 DIAGNOSIS — K625 Hemorrhage of anus and rectum: Secondary | ICD-10-CM | POA: Diagnosis not present

## 2018-07-15 NOTE — Assessment & Plan Note (Addendum)
48 y.o. male with history of hemorrhoids and associated tissue hematochezia. Patient underwent banding of left lateral and right anterior hemorrhoid column in 2018. Symptoms had resolved at that time and posterior column was not banded. Symptoms of tissue prolapse, burning, and tissue hematochezia have returned. BMs without straining typically, but patient admits to heavy lifting at work. No alarm symptoms. Colonoscopy in 2017 with tubular adenomas. Due for surveillance in 2022. Exam without external abnormalities. Posterior internal hemorrhoid appreciated.   Will schedule patient for hemorrhoid banding with Dr. Gala Romney. Continue suppositories as needed.  Add low dose metamucil daily to every few days to prevent constipation.  Limit toilet time to 2-3 minutes.

## 2018-07-15 NOTE — Patient Instructions (Signed)
1. Continue using suppositories as needed for hemorrhoids.   2. Try adding low dose metamucil daily to every few days to prevent constipation. If you begin having diarrhea, you may decrease frequency, but we want you to avoid straining. Limit toilet time to 2-3 minutes at a time.   3. Continue taking your Protonix twice daily, 30 minutes before meals. Continue avoiding foods that cause reflux symptoms. You should also try placing the head of your bead on bricks to help with night time reflux.   4. We will get you scheduled for a hemorrhoid banding with Dr. Gala Romney in the near future.   If you have concerns prior to your next visit, please call.   Aliene Altes, PA-C [facility]

## 2018-07-15 NOTE — Assessment & Plan Note (Addendum)
Patient with history of GERD. Recent EGD on 06/09/18 with mild erosive reflux esophagitis. Noncritical Schatzki's ring. Dilated. Gastric erythema of doubtful clinical significance. Normal duodenal bulb and second portion of the duodenum. No specimens collected. Small hiatal hernia. Protonix had been increased to twice daily. Patient feels like this is helping some. Still having breakthrough symptoms a couple times a week usually associated with acidic or spicy foods or occurring when laying down. No abdominal pain at this time. No alarm symptoms.   Discussed possibly switching to Dexilant, but patient desires to continue trying Protonix 40 mg BID at this time.  Advised patient to try eating smaller meals with history of hiatal hernia and ensuring not to lay down within 3 hours of eating.  Continue avoiding dietary triggers.  Prop head of bed up on bricks to help with night time symptoms.

## 2018-07-18 NOTE — Progress Notes (Signed)
CC'D TO PCP °

## 2018-07-25 ENCOUNTER — Ambulatory Visit: Payer: Self-pay | Admitting: Physician Assistant

## 2018-07-25 ENCOUNTER — Telehealth: Payer: Self-pay | Admitting: Rheumatology

## 2018-07-25 NOTE — Telephone Encounter (Signed)
Patient is scheduled for an appointment tomorrow 07/26/2018.

## 2018-07-25 NOTE — Progress Notes (Signed)
Office Visit Note  Patient: Charles [name]             Date of Birth: [birth year]           MRN: [id number]             PCP: Celene Squibb, MD Referring: Celene Squibb, MD Visit Date: 07/26/2018 Occupation: @GUAROCC @  Subjective:  Lower back pain.   History of Present Illness: Charles [name] is a 48 y.o. male with history of psoriatic arthritis and osteoarthritis.  He states he has been having severe lower back pain for the last few days.  He has had lower back pain for a while but the symptoms have gotten worse.  He states the pain has been radiating to his bilateral SI joints.  None of the other joints are painful.  He has no joint swelling.  He states despite taking all the medications he has had no relief in his lower back pain.  He denies any paresthesias.  There is no history of any injury.  He was seen by Dr. Durward Fortes in January 2020 at that time the lumbar spine x-ray showed multilevel disc disease.  He is also had MRI of his lumbar spine in March 2019.  He has no active psoriasis lesions.  Activities of Daily Living:  Patient reports morning stiffness for 24 hours.   Patient Reports nocturnal pain.  Difficulty dressing/grooming: Reports Difficulty climbing stairs: Reports Difficulty getting out of chair: Reports Difficulty using hands for taps, buttons, cutlery, and/or writing: Denies  Review of Systems  Constitutional: Positive for fatigue.  HENT: Negative for mouth sores, mouth dryness and nose dryness.   Eyes: Negative for pain, itching and dryness.  Respiratory: Negative for shortness of breath, wheezing and difficulty breathing.   Cardiovascular: Negative for chest pain, palpitations and swelling in legs/feet.  Gastrointestinal: Negative for abdominal pain, constipation and diarrhea.  Endocrine: Negative for increased urination.  Genitourinary: Negative for painful urination and pelvic pain.  Musculoskeletal: Positive for arthralgias, gait problem, joint pain, joint  swelling and morning stiffness.  Skin: Negative for rash and redness.  Allergic/Immunologic: Negative for susceptible to infections.  Neurological: Positive for weakness. Negative for dizziness, light-headedness, headaches and memory loss.  Hematological: Negative for bruising/bleeding tendency.  Psychiatric/Behavioral: Negative for confusion. The patient is not nervous/anxious.     PMFS History:  Patient Active Problem List   Diagnosis Date Noted  . GERD (gastroesophageal reflux disease) 05/25/2018  . Dysphagia 05/25/2018  . Acute left-sided low back pain with left-sided sciatica 02/01/2018  . Pain in left hip 02/01/2018  . Constipation 05/11/2017  . Chronic right-sided low back pain with right-sided sciatica 04/05/2017  . 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 Bilateral sacroiliitis ([place]) 05/22/2016  . Psoriatic arthritis ([place]) 05/22/2016  . High risk medications (not anticoagulants) long-term use 05/22/2016  . DJD (degenerative joint disease), cervical 05/22/2016  . Osteoarthritis of spine with radiculopathy, lumbar region 05/22/2016  . Cephalalgia 03/16/2016  . Chronic daily headache 03/16/2016  . Neck pain 03/16/2016  . Hemorrhoids 03/13/2016  . 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 Insomnia 12/26/2015  . Abdominal pain, epigastric 11/13/2015  . Dark stools 11/13/2015  . Rectal bleeding 11/13/2015  . Numbness 09/26/2015  . 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 Avascular necrosis of left femoral head ([place]) 07/30/2015  . Avascular necrosis of right femoral head ([place]) 07/30/2015  . Psoriasis 07/30/2015  . Chronic  obstructive pulmonary disease (COPD) ([place]) 07/30/2015  . 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 Arthritis    also has avascular necrosis   . Chronic back pain   . Chronic neck pain   . Chronic shoulder pain   . COPD  (chronic obstructive pulmonary disease) ([place])   . GERD (gastroesophageal reflux disease)    tums  OTC  medication  . Headache   . HTN (hypertension)   . Hypothyroidism   . Psoriatic arthritis ([place])   . Sleep apnea    tested more than 5 yrs ago...negative results    Family History  Problem Relation Age of Onset  . Heart failure Mother   . Diabetes Mother   . Hypertension Mother   . Heart failure Father   . Prostate cancer Father   . Arthritis Father   . Hypertension Sister   . Heart attack Sister   . Hypercholesterolemia Sister   . Healthy Daughter   . Colon cancer Neg Hx   . Gastric cancer Neg Hx   . Esophageal cancer Neg Hx    Past Surgical History:  Procedure Laterality Date  . COLONOSCOPY WITH PROPOFOL N/A 12/09/2015   Procedure: COLONOSCOPY WITH PROPOFOL;  Surgeon: Daneil Dolin, MD;  [facility];  Service: Endoscopy;  Laterality: N/A;  1100  . ESOPHAGOGASTRODUODENOSCOPY (EGD) WITH PROPOFOL N/A 12/09/2015   Procedure: ESOPHAGOGASTRODUODENOSCOPY (EGD) WITH PROPOFOL;  Surgeon: Daneil Dolin, MD;  [facility];  Service: Endoscopy;  Laterality: N/A;  . ESOPHAGOGASTRODUODENOSCOPY (EGD) WITH PROPOFOL N/A 06/09/2018   Procedure: ESOPHAGOGASTRODUODENOSCOPY (EGD) WITH PROPOFOL;  Surgeon: Daneil Dolin, MD;  [facility];  Service: Endoscopy;  Laterality: N/A;  8:00am  . JOINT REPLACEMENT     left hip 2017  . MALONEY DILATION N/A 06/09/2018   Procedure: Venia Minks DILATION;  Surgeon: Daneil Dolin, MD;  [facility];  Service: Endoscopy;  Laterality: N/A;  . NECK SURGERY    . SHOULDER SURGERY    . TOTAL HIP ARTHROPLASTY Left 07/30/2015   Procedure: TOTAL HIP ARTHROPLASTY;  Surgeon: Garald Balding, MD;  Location: [facility];  Service: Orthopedics;  Laterality: Left;  . TOTAL HIP ARTHROPLASTY Right 09/22/2016  . TOTAL HIP ARTHROPLASTY Right 09/22/2016   Procedure: RIGHT TOTAL HIP ARTHROPLASTY;  Surgeon: Garald Balding, MD;  Location: [facility];  Service: Orthopedics;  Laterality: Right;   Social History   Social History Narrative  . Not on file   Immunization History  Administered Date(s) Administered  . Pneumococcal Polysaccharide-23 09/23/2016  . Tdap 04/16/2016     Objective: Vital Signs: BP (!) 156/88 (BP Location: Left Arm, Patient Position: Sitting, Cuff Size: Normal)   Pulse 74   Resp 14   Ht 6' (1.829 m)   Wt 196 lb 6.4 oz (89.1 kg)   BMI 26.64 kg/m    Physical Exam Vitals signs and nursing note reviewed.  Constitutional:      Appearance: He is well-developed.  HENT:     Head: Normocephalic and atraumatic.  Eyes:     Conjunctiva/sclera: Conjunctivae normal.     Pupils: Pupils are equal, round, and reactive to light.  Neck:     Musculoskeletal: Normal range of motion and neck supple.  Cardiovascular:     Rate and Rhythm: Normal rate and regular rhythm.     Heart sounds: Normal heart sounds.  Pulmonary:     Effort: Pulmonary  effort is normal.     Breath sounds: Normal breath sounds.  Abdominal:     General: Bowel sounds are normal.     Palpations: Abdomen is soft.  Skin:    General: Skin is warm and dry.     Capillary Refill: Capillary refill takes less than 2 seconds.  Neurological:     Mental Status: He is alert and oriented to person, place, and time.  Psychiatric:        Behavior: Behavior normal.      Musculoskeletal Exam: C-spine was in good range of motion.  He has limited painful range of motion of his lumbar spine.  He had tenderness on palpation over the lumbar spine.  No significant SI joint tenderness was noted.  Shoulder joints, elbow joints, wrist joints, MCPs PIPs and DIPs been good range of motion with no synovitis.  Hip joints, knee joints, and pulse, MTPs and PIPs and DIPs with good range of motion with no synovitis.  CDAI Exam: CDAI Score: - Patient Global: -; Provider Global: - Swollen: -; Tender: - Joint Exam   No joint exam has been documented for this visit    There is currently no information documented on the homunculus. Go to the Rheumatology activity and complete the homunculus joint exam.  Investigation: No additional findings.  Imaging: No results found.  Recent Labs: Lab Results  Component Value Date   WBC 5.9 07/07/2018   HGB 15.6 07/07/2018   PLT 179 07/07/2018   NA 140 07/07/2018   K 4.6 07/07/2018   CL 107 07/07/2018   CO2 25 07/07/2018   GLUCOSE 94 07/07/2018   BUN 13 07/07/2018   CREATININE 0.91 07/07/2018   BILITOT 0.6 07/07/2018   ALKPHOS 44 09/10/2016   AST 18 07/07/2018   ALT 15 07/07/2018   PROT 7.0 07/07/2018   ALBUMIN 4.4 09/10/2016   CALCIUM 9.8 07/07/2018   GFRAA 117 07/07/2018   QFTBGOLDPLUS NEGATIVE 02/23/2018    Speciality Comments: Prior therapy: Enbrel  Procedures:  No procedures performed Allergies: Patient has no known allergies.   Assessment / Plan:     Visit Diagnoses: Psoriatic arthritis ([place]) -patient psoriatic arthritis seems to be very well controlled without any synovitis on examination.  No evidence of dactylitis, Achilles tendinitis or plantar fasciitis was noted.  Psoriasis -he has no active psoriasis lesions.  High risk medications (not anticoagulants) long-term use - Cosentyx 300 mg every 28 days.  Last TB gold negative on 02/23/2018  -his labs are stable.  We will be checking labs every 3 months.  Bilateral sacroiliitis ([place]) -he has mild discomfort in the SI joints.  DDD (degenerative disc disease), cervical -he has good range of motion of the cervical spine.  DDD (degenerative disc disease), lumbar -patient is having severe lower back pain.  He has had x-rays and MRI in the last year.  I will refer him to Dr. Ernestina Patches for evaluation and possible injection.  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  Neuropathic pain involving left lateral femoral cutaneous nerve   Orders: Orders Placed This Encounter  Procedures  . Ambulatory referral to Physical Medicine Rehab   No orders of the defined types were placed in this encounter.   Follow-Up Instructions: Return in about 3 months (around 10/26/2018) for Psoriatic arthritis.   Bo Merino, MD  Note - This record has been created using Dragon software.  Chart creation errors have been sought, but may not always  have been located. Such creation errors do not reflect on  the standard of medical care.  

## 2018-07-25 NOTE — Progress Notes (Deleted)
Office Visit Note  Patient: Charles [name]             Date of Birth: 1970-11-27           MRN: [id number]             PCP: Celene Squibb, MD Referring: Celene Squibb, MD Visit Date: 08/02/2018 Occupation: @GUAROCC @  Subjective:  No chief complaint on file.   History of Present Illness: Charles [name] is a 48 y.o. male ***   Activities of Daily Living:  Patient reports morning stiffness for *** {minute/hour:19697}.   Patient {ACTIONS;DENIES/REPORTS:21021675::"Denies"} nocturnal pain.  Difficulty dressing/grooming: {ACTIONS;DENIES/REPORTS:21021675::"Denies"} Difficulty climbing stairs: {ACTIONS;DENIES/REPORTS:21021675::"Denies"} Difficulty getting out of chair: {ACTIONS;DENIES/REPORTS:21021675::"Denies"} Difficulty using hands for taps, buttons, cutlery, and/or writing: {ACTIONS;DENIES/REPORTS:21021675::"Denies"}  No Rheumatology ROS completed.   PMFS History:  Patient Active Problem List   Diagnosis Date Noted  . GERD (gastroesophageal reflux disease) 05/25/2018  . Dysphagia 05/25/2018  . Acute left-sided low back pain with left-sided sciatica 02/01/2018  . Pain in left hip 02/01/2018  . Constipation 05/11/2017  . Chronic right-sided low back pain with right-sided sciatica 04/05/2017  . 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 Bilateral sacroiliitis ([place]) 05/22/2016  . Psoriatic arthritis ([place]) 05/22/2016  . High risk medications (not anticoagulants) long-term use 05/22/2016  . DJD (degenerative joint disease), cervical 05/22/2016  . Osteoarthritis of spine with radiculopathy, lumbar region 05/22/2016  . Cephalalgia 03/16/2016  . Chronic daily headache 03/16/2016  . Neck pain 03/16/2016  . Hemorrhoids 03/13/2016  . 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 Insomnia 12/26/2015  . Abdominal pain, epigastric 11/13/2015  . Dark stools 11/13/2015  . Rectal bleeding 11/13/2015  . Numbness 09/26/2015  . Neuropathic pain involving  left lateral femoral cutaneous nerve 09/26/2015  . Avascular necrosis of left femoral head ([employer]) 07/30/2015  . Avascular necrosis of right femoral head ([place]) 07/30/2015  . Psoriasis 07/30/2015  . Chronic obstructive pulmonary disease (COPD) ([facility]) 07/30/2015  . 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 Arthritis    also has avascular necrosis   . Chronic back pain   . Chronic neck pain   . Chronic shoulder pain   . COPD (chronic obstructive pulmonary disease) ([place])   . GERD (gastroesophageal reflux disease)    tums  OTC  medication  . Headache   . HTN (hypertension)   . Hypothyroidism   . Psoriatic arthritis ([place])   . Sleep apnea    tested more than 5 yrs ago...negative results    Family History  Problem Relation Age of Onset  . Heart failure Mother   . Diabetes Mother   . Hypertension Mother   . Heart failure Father   . Prostate cancer Father   . Arthritis Father   . Hypertension Sister   . Heart attack Sister   . Hypercholesterolemia Sister   . Healthy Daughter   . Colon cancer Neg Hx   . Gastric cancer Neg Hx   . Esophageal cancer Neg Hx    Past Surgical History:  Procedure Laterality Date  . COLONOSCOPY WITH PROPOFOL N/A 12/09/2015   Procedure: COLONOSCOPY WITH PROPOFOL;  Surgeon: Daneil Dolin, MD;  [facility];  Service: Endoscopy;  Laterality: N/A;  1100  . ESOPHAGOGASTRODUODENOSCOPY (EGD) WITH PROPOFOL N/A 12/09/2015   Procedure: ESOPHAGOGASTRODUODENOSCOPY (EGD) WITH PROPOFOL;  Surgeon: Daneil Dolin, MD;  [facility];  Service: Endoscopy;  Laterality: N/A;  . ESOPHAGOGASTRODUODENOSCOPY (EGD) WITH PROPOFOL N/A 06/09/2018   Procedure: ESOPHAGOGASTRODUODENOSCOPY (EGD) WITH PROPOFOL;  Surgeon: Daneil Dolin, MD;  [facility];  Service: Endoscopy;  Laterality: N/A;  8:00am  . JOINT REPLACEMENT     left hip 2017  . MALONEY DILATION N/A 06/09/2018   Procedure: Venia Minks DILATION;   Surgeon: Daneil Dolin, MD;  [facility];  Service: Endoscopy;  Laterality: N/A;  . NECK SURGERY    . SHOULDER SURGERY    . TOTAL HIP ARTHROPLASTY Left 07/30/2015   Procedure: TOTAL HIP ARTHROPLASTY;  Surgeon: Garald Balding, MD;  Location: [facility];  Service: Orthopedics;  Laterality: Left;  . TOTAL HIP ARTHROPLASTY Right 09/22/2016  . TOTAL HIP ARTHROPLASTY Right 09/22/2016   Procedure: RIGHT TOTAL HIP ARTHROPLASTY;  Surgeon: Garald Balding, MD;  Location: [facility];  Service: Orthopedics;  Laterality: Right;   Social History   Social History Narrative  . Not on file   Immunization History  Administered Date(s) Administered  . Pneumococcal Polysaccharide-23 09/23/2016  . Tdap 04/16/2016     Objective: Vital Signs: There were no vitals taken for this visit.   Physical Exam   Musculoskeletal Exam: ***  CDAI Exam: CDAI Score: - Patient Global: -; Provider Global: - Swollen: -; Tender: - Joint Exam   No joint exam has been documented for this visit   There is currently no information documented on the homunculus. Go to the Rheumatology activity and complete the homunculus joint exam.  Investigation: No additional findings.  Imaging: No results found.  Recent Labs: Lab Results  Component Value Date   WBC 5.9 07/07/2018   HGB 15.6 07/07/2018   PLT 179 07/07/2018   NA 140 07/07/2018   K 4.6 07/07/2018   CL 107 07/07/2018   CO2 25 07/07/2018   GLUCOSE 94 07/07/2018   BUN 13 07/07/2018   CREATININE 0.91 07/07/2018   BILITOT 0.6 07/07/2018   ALKPHOS 44 09/10/2016   AST 18 07/07/2018   ALT 15 07/07/2018   PROT 7.0 07/07/2018   ALBUMIN 4.4 09/10/2016   CALCIUM 9.8 07/07/2018   GFRAA 117 07/07/2018   QFTBGOLDPLUS NEGATIVE 02/23/2018    Speciality Comments: No specialty comments available.  Procedures:  No procedures performed Allergies: Patient has no known allergies.   Assessment / Plan:     Visit Diagnoses: No diagnosis found.  Orders:  No orders of the defined types were placed in this encounter.  No orders of the defined types were placed in this encounter.   Face-to-face time spent with patient was *** minutes. Greater than 50% of time was spent in counseling and coordination of care.  Follow-Up Instructions: No follow-ups on file.   Ofilia Neas, PA-C  Note - This record has been created using Dragon software.  Chart creation errors have been sought, but may not always  have been located. Such creation errors do not reflect on  the standard of medical care.

## 2018-07-25 NOTE — Telephone Encounter (Signed)
Patient called stating he received a Cosentyx injection at his last office visit, but states his lower back pain has increased to the point where he is having difficulty with ADL's.  Patient states he doesn't think the medication is working and requesting a return call.

## 2018-07-26 ENCOUNTER — Other Ambulatory Visit: Payer: Self-pay

## 2018-07-26 ENCOUNTER — Ambulatory Visit: Payer: Managed Care, Other (non HMO) | Admitting: Rheumatology

## 2018-07-26 ENCOUNTER — Encounter: Payer: Self-pay | Admitting: Rheumatology

## 2018-07-26 ENCOUNTER — Telehealth: Payer: Self-pay | Admitting: Rheumatology

## 2018-07-26 ENCOUNTER — Encounter: Payer: Self-pay | Admitting: *Deleted

## 2018-07-26 VITALS — BP 156/88 | HR 74 | Resp 14 | Ht 72.0 in | Wt 196.4 lb

## 2018-07-26 DIAGNOSIS — Z79899 Other long term (current) drug therapy: Secondary | ICD-10-CM

## 2018-07-26 DIAGNOSIS — M51369 Other intervertebral disc degeneration, lumbar region without mention of lumbar back pain or lower extremity pain: Secondary | ICD-10-CM

## 2018-07-26 DIAGNOSIS — F5101 Primary insomnia: Secondary | ICD-10-CM

## 2018-07-26 DIAGNOSIS — G5712 Meralgia paresthetica, left lower limb: Secondary | ICD-10-CM

## 2018-07-26 DIAGNOSIS — M5030 Other cervical disc degeneration, unspecified cervical region: Secondary | ICD-10-CM

## 2018-07-26 DIAGNOSIS — L409 Psoriasis, unspecified: Secondary | ICD-10-CM | POA: Diagnosis not present

## 2018-07-26 DIAGNOSIS — Z96643 Presence of artificial hip joint, bilateral: Secondary | ICD-10-CM

## 2018-07-26 DIAGNOSIS — G5711 Meralgia paresthetica, right lower limb: Secondary | ICD-10-CM

## 2018-07-26 DIAGNOSIS — L4050 Arthropathic psoriasis, unspecified: Secondary | ICD-10-CM | POA: Diagnosis not present

## 2018-07-26 DIAGNOSIS — M5136 Other intervertebral disc degeneration, lumbar region: Secondary | ICD-10-CM

## 2018-07-26 DIAGNOSIS — M461 Sacroiliitis, not elsewhere classified: Secondary | ICD-10-CM | POA: Diagnosis not present

## 2018-07-26 NOTE — Patient Instructions (Signed)
Standing Labs We placed an order today for your standing lab work.    Please come back and get your standing labs in October and every 3 months   We have open lab daily Monday through Thursday from 8:30-12:30 PM and 1:30-4:30 PM and Friday from 8:30-12:30 PM and 1:30 -4:00 PM at the office of Dr. Neisha Hinger.   You may experience shorter wait times on Monday and Friday afternoons. The office is located at [address], [address], [place], [place] [address] No appointment is necessary.   Labs are drawn by [facility].  You may receive a bill from [facility] for your lab work.  If you wish to have your labs drawn at another location, please call the office 24 hours in advance to send orders.  If you have any questions regarding directions or hours of operation,  please call [phone].   Just as a reminder please drink plenty of water prior to coming for your lab work. Thanks!   

## 2018-07-26 NOTE — Telephone Encounter (Signed)
Patient left a voicemail stating the first available appointment with Dr. Ernestina Patches is 08/15/18.  Patient is requesting a return call to let him know if there is something he can take for the pain.

## 2018-07-28 ENCOUNTER — Other Ambulatory Visit: Payer: Self-pay

## 2018-07-28 ENCOUNTER — Encounter: Payer: Self-pay | Admitting: Physical Medicine and Rehabilitation

## 2018-07-28 ENCOUNTER — Ambulatory Visit: Payer: Self-pay

## 2018-07-28 ENCOUNTER — Ambulatory Visit (INDEPENDENT_AMBULATORY_CARE_PROVIDER_SITE_OTHER): Payer: Managed Care, Other (non HMO) | Admitting: Physical Medicine and Rehabilitation

## 2018-07-28 VITALS — BP 146/85 | HR 87

## 2018-07-28 DIAGNOSIS — M5416 Radiculopathy, lumbar region: Secondary | ICD-10-CM

## 2018-07-28 MED ORDER — METHYLPREDNISOLONE ACETATE 80 MG/ML IJ SUSP
80.0000 mg | Freq: Once | INTRAMUSCULAR | Status: AC
  Administered 2018-07-28: 80 mg

## 2018-07-28 NOTE — Progress Notes (Signed)
.  Numeric Pain Rating Scale and Functional Assessment Average Pain 9   In the last MONTH (on 0-10 scale) has pain interfered with the following?  1. General activity like being  able to carry out your everyday physical activities such as walking, climbing stairs, carrying groceries, or moving a chair?  Rating(10)   +Driver, -BT, -Dye Allergies.

## 2018-07-29 NOTE — Procedures (Signed)
Lumbar Epidural Steroid Injection - Interlaminar Approach with Fluoroscopic Guidance  Patient: Charles [name]      Date of Birth: 05-Sep-1970 MRN: [id number] PCP: Celene Squibb, MD      Visit Date: 07/28/2018   Universal Protocol:     Consent Given By: the patient  Position: PRONE  Additional Comments: Vital signs were monitored before and after the procedure. Patient was prepped and draped in the usual sterile fashion. The correct patient, procedure, and site was verified.   Injection Procedure Details:  Procedure Site One Meds Administered:  Meds ordered this encounter  Medications  . methylPREDNISolone acetate (DEPO-MEDROL) injection 80 mg     Laterality: Right  Location/Site:  L4-L5  Needle size: 20 G  Needle type: Tuohy  Needle Placement: Paramedian epidural  Findings:   -Comments: Excellent flow of contrast into the epidural space.  Procedure Details: Using a paramedian approach from the side mentioned above, the region overlying the inferior lamina was localized under fluoroscopic visualization and the soft tissues overlying this structure were infiltrated with 4 ml. of 1% Lidocaine without Epinephrine. The Tuohy needle was inserted into the epidural space using a paramedian approach.   The epidural space was localized using loss of resistance along with lateral and bi-planar fluoroscopic views.  After negative aspirate for air, blood, and CSF, a 2 ml. volume of Isovue-250 was injected into the epidural space and the flow of contrast was observed. Radiographs were obtained for documentation purposes.    The injectate was administered into the level noted above.   Additional Comments:  No complications occurred Dressing: 2 x 2 sterile gauze and Band-Aid    Post-procedure details: Patient was observed during the procedure. Post-procedure instructions were reviewed.  Patient left the clinic in stable condition.

## 2018-07-29 NOTE — Progress Notes (Signed)
Charles [name] - 48 y.o. male MRN [id number]  Date of birth: 03-09-1970  Office Visit Note: Visit Date: 07/28/2018 PCP: Celene Squibb, MD Referred by: Celene Squibb, MD  Subjective: Chief Complaint  Patient presents with  . Lower Back - Pain   HPI:  Charles [name] is a 48 y.o. male who comes in today At the request of Dr. Tedd Sias for L4-5 interlaminar epidural steroid injection which is a repeat injection from prior injections performed at [facility] imaging earlier in the year.  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  He has had ongoing neck and back pain.  Managed to some degree with opioid medication from his primary care physician Dr. [name].  Patient reports 2 prior epidural injections at [facility] imaging which were L4-5 interlaminar injections with some relief.  Is really hard to get out him how much relief was obtained he says maybe 50% for a little while.  MRI shows right extraforaminal high signal intensity representing annular tear which could irritate L4 nerve root.  There is no nerve compression no central stenosis.  At L5-S1 there is bilateral foraminal narrowing pretty significantly.  His pain is actually more right-sided today than left-sided as it has been in the past.  We are going to repeat the L4-5 interlaminar injection.  Consideration should be given to L5 transforaminal injection depending on results and his ongoing treatment.  ROS Otherwise per HPI.  Assessment & Plan: Visit Diagnoses:  1. Lumbar radiculopathy     Plan: No additional findings.   Meds & Orders:  Meds ordered this encounter  Medications  . methylPREDNISolone acetate (DEPO-MEDROL) injection 80 mg    Orders Placed This Encounter  Procedures  . XR C-ARM NO REPORT  . Epidural Steroid injection    Follow-up: No follow-ups on file.   Procedures: No procedures performed  Lumbar Epidural Steroid Injection -  Interlaminar Approach with Fluoroscopic Guidance  Patient: Charles [name]      Date of Birth: 08/10/1970 MRN: [id number] PCP: Celene Squibb, MD      Visit Date: 07/28/2018   Universal Protocol:     Consent Given By: the patient  Position: PRONE  Additional Comments: Vital signs were monitored before and after the procedure. Patient was prepped and draped in the usual sterile fashion. The correct patient, procedure, and site was verified.   Injection Procedure Details:  Procedure Site One Meds Administered:  Meds ordered this encounter  Medications  . methylPREDNISolone acetate (DEPO-MEDROL) injection 80 mg     Laterality: Right  Location/Site:  L4-L5  Needle size: 20 G  Needle type: Tuohy  Needle Placement: Paramedian epidural  Findings:   -Comments: Excellent flow of contrast into the epidural space.  Procedure Details: Using a paramedian approach from the side mentioned above, the region overlying the inferior lamina was localized under fluoroscopic visualization and the soft tissues overlying this structure were infiltrated with 4 ml. of 1% Lidocaine without Epinephrine. The Tuohy needle was inserted into the epidural space using a paramedian approach.   The epidural space was localized using loss of resistance along with lateral and bi-planar fluoroscopic views.  After negative aspirate for air, blood, and CSF, a 2 ml. volume of Isovue-250 was injected into the epidural space and the flow of contrast was observed. Radiographs were obtained for documentation purposes.    The injectate was administered into the level noted above.  Additional Comments:  No complications occurred Dressing: 2 x 2 sterile gauze and Band-Aid    Post-procedure details: Patient was observed during the procedure. Post-procedure instructions were reviewed.  Patient left the clinic in stable condition.   Clinical History: MRI LUMBAR SPINE WITHOUT CONTRAST  TECHNIQUE:  Multiplanar, multisequence MR imaging of the lumbar spine was performed. No intravenous contrast was administered.  COMPARISON:  Prior radiograph from 10/23/2014.  FINDINGS: Segmentation: Normal segmentation. Lowest well-formed disc labeled the L5-S1 level.  Alignment: Straightening with slight reversal of the normal lumbar lordosis. No listhesis.  Vertebrae: Vertebral body heights maintained without evidence for acute or chronic fracture. Bone marrow signal intensity within normal limits. No discrete or worrisome osseous lesions. No abnormal marrow edema.  Conus medullaris and cauda equina: Conus extends to the L1 level. Conus and cauda equina appear normal.  Paraspinal and other soft tissues: Visualized paraspinous soft tissues within normal limits. Visualized visceral structures are unremarkable.  Disc levels:  Lumbar spine image from the T11-12 level inferiorly through the sacrum. No significant findings are seen through the L1-2 level.  L2-3: Diffuse disc bulge with disc desiccation and intervertebral disc space narrowing. No significant spinal stenosis. Mild left L2 foraminal narrowing. No significant right foraminal encroachment.  L3-4: Chronic intervertebral disc space narrowing with diffuse disc bulge and disc desiccation. No significant spinal stenosis. Mild bilateral L3 foraminal narrowing  L4-5: Mild diffuse disc bulge with disc desiccation. Mild reactive endplate changes. Right extraforaminal/far lateral annular fissure noted, closely approximating the exiting right L4 nerve root (series 6, image 29). No significant canal stenosis. Moderate bilateral L4 foraminal stenosis, right greater than left.  L5-S1: Mild disc bulge with disc desiccation. Mild reactive endplate changes with marginal endplate osteophytic spurring. Mild facet and ligament flavum hypertrophy. No significant canal or lateral recess stenosis. Moderate to advanced bilateral L5  foraminal narrowing.  IMPRESSION: 1. Degenerative disc bulge with facet hypertrophy at L5-S1 with resultant moderate to advanced bilateral L5 foraminal stenosis. 2. Disc bulge with associated right far lateral/extraforaminal annular fissure at L4-5, closely approximating and potentially irritating the exiting right L4 nerve root.   Electronically Signed   By: Jeannine Boga M.D.   On: 03/31/2017 16:06     Objective:  VS:  HT:    WT:   BMI:     BP:(!) 146/85  HR:87bpm  TEMP: ( )  RESP:  Physical Exam  Ortho Exam Imaging: Xr C-arm No Report  Result Date: 07/28/2018 Please see Notes tab for imaging impression.

## 2018-08-02 ENCOUNTER — Ambulatory Visit: Payer: Managed Care, Other (non HMO) | Admitting: Physician Assistant

## 2018-08-15 ENCOUNTER — Encounter: Payer: Managed Care, Other (non HMO) | Admitting: Physical Medicine and Rehabilitation

## 2018-09-01 ENCOUNTER — Ambulatory Visit: Payer: Managed Care, Other (non HMO) | Admitting: Nurse Practitioner

## 2018-09-06 ENCOUNTER — Ambulatory Visit: Payer: Managed Care, Other (non HMO) | Admitting: Internal Medicine

## 2018-09-06 ENCOUNTER — Encounter: Payer: Self-pay | Admitting: Internal Medicine

## 2018-09-06 ENCOUNTER — Other Ambulatory Visit: Payer: Self-pay

## 2018-09-06 VITALS — BP 136/86 | HR 76 | Temp 98.2°F | Ht 72.0 in | Wt 194.0 lb

## 2018-09-06 DIAGNOSIS — K648 Other hemorrhoids: Secondary | ICD-10-CM | POA: Diagnosis not present

## 2018-09-06 NOTE — Progress Notes (Signed)
[place] banding procedure note:  The patient presents with symptomatic grade 2 hemorrhoids -status post banding of the left lateral and right anterior columns a few years ago.  Further banding not provided because patient symptoms are resolved.  Having issues with hygiene and not being able to evacuate totally; having some paper hematochezia recently.  Only takes 1 teaspoon of Benefiber daily denies straining.  At least one column remains un- treated as outlined. I told the patient we ought to go ahead and band the remaining call him and see how he does with a bolstered fiber regimen.  I could told him a 100% guarantee that all of his symptoms would resolve could not be given.  Colonoscopy is fairly up-to-date., . All risks, benefits, and alternative forms of therapy were described and informed consent was obtained.  In the left lateral decubitus position, DRE revealed no abnormalities.  DRE utilizing lubrication and 2% Xylocaine was utilized.  I elected to band the right posterior column.  The [place] was used to perform band ligation without complication. Digital anorectal examination was then performed to assure proper positioning of the band and to adjust the banded tissue as required.  Band was found to be in excellent position on DRE.  No pinching or pain.  Elected to place a band in the neutral position;  I placed a band.  DRE revealed band to be minimally attached on the right side.  I proceede t polace a third band directly  more to the right mid rectum.  This was done with a third pass;  follow-up DRE revealed the band to be in excellent position.  No pinching or pain.  Dietary and behavioral recommendations were given.  Patient instructed to bolster his fiber intake as follows:   increase to 1 tablespoon daily for 3 weeks and increase to 1 tablespoon twice daily thereafter.  Office visit with [place] in 3 months.  He may or may not need further touchup banding.  No complications were  encountered and the patient tolerated the procedure well.

## 2018-09-06 NOTE — Patient Instructions (Signed)
Avoid straining.  Increase Benefiber 1 tablespoon daily for 3 weeks; then increase to twice daily thereafter.    Limit toilet time to 5 minutes  Call with any interim problems  Schedule followup appointment 3 months from now.  You may need 1 or 2 more bands placed in 3 months but will see how you are doing at that time.

## 2018-09-20 NOTE — Progress Notes (Signed)
Office Visit Note  Patient: Charles [name]             Date of Birth: Dec 18, 1970           MRN: [id number]             PCP: Celene Squibb, MD Referring: Celene Squibb, MD Visit Date: 09/21/2018 Occupation: @GUAROCC @  Subjective:  Lower back pain     History of Present Illness: Charles [name] is a 48 y.o. male with history of psoriatic arthritis and DDD.  He is on cosentyx 300 mg sq injections every 28 days.  He has been on Cosentyx since June 2018.  He previously had an adequate response to enbrel in the past. he presents today with increased lower back pain.  He is having bilateral SI joint pain as well as midline spinal tenderness in the lumbar region.  He states that for the past 1 week he has been experiencing a burning and numbness sensation on the anterior aspect of both thighs.  He is also been having a sensation of coolness in his lower extremities.  He denies any recent injuries or falls.  He has been experiencing nocturnal pain. He had a epidural steroid injection on 07/28/18 performed by Dr. Ernestina Patches. He states the pain improved for 3 weeks.   He takes percocet every 4 hours and flexeril and gabapentin as prescribed.  He denies any other joint pain or joint swelling at this time.  He denies any active psoriasis.  He denies any Achilles tendinitis or plantar fasciitis.    Activities of Daily Living:  Patient reports morning stiffness for 1  hour.   Patient Reports nocturnal pain.  Difficulty dressing/grooming: Denies Difficulty climbing stairs: Denies Difficulty getting out of chair: Reports Difficulty using hands for taps, buttons, cutlery, and/or writing: Denies  Review of Systems  Constitutional: Positive for fatigue. Negative for night sweats.  HENT: Negative for mouth sores, mouth dryness and nose dryness.   Eyes: Negative for redness, visual disturbance and dryness.  Respiratory: Negative for cough, hemoptysis, shortness of breath and difficulty breathing.   Cardiovascular:  Negative for chest pain, palpitations, hypertension, irregular heartbeat and swelling in legs/feet.  Gastrointestinal: Negative for blood in stool, constipation and diarrhea.  Endocrine: Negative for increased urination.  Genitourinary: Negative for painful urination.  Musculoskeletal: Positive for arthralgias, joint pain and morning stiffness. Negative for joint swelling, myalgias, muscle weakness, muscle tenderness and myalgias.  Skin: Negative for color change, rash, hair loss, nodules/bumps, skin tightness, ulcers and sensitivity to sunlight.  Allergic/Immunologic: Negative for susceptible to infections.  Neurological: Negative for dizziness, fainting, memory loss, night sweats and weakness.  Hematological: Negative for swollen glands.  Psychiatric/Behavioral: Positive for sleep disturbance. Negative for depressed mood. The patient is not nervous/anxious.     PMFS History:  Patient Active Problem List   Diagnosis Date Noted   GERD (gastroesophageal reflux disease) 05/25/2018   Dysphagia 05/25/2018   Acute left-sided low back pain with left-sided sciatica 02/01/2018   Pain in left hip 02/01/2018   Constipation 05/11/2017   Chronic right-sided low back pain with right-sided sciatica 04/05/2017   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   Bilateral sacroiliitis ([place]) 05/22/2016   Psoriatic arthritis (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   Insomnia 12/26/2015   Abdominal pain, epigastric 11/13/2015   Dark stools 11/13/2015   Rectal bleeding 11/13/2015   Numbness 09/26/2015   Neuropathic pain  involving left lateral femoral cutaneous nerve 09/26/2015   Avascular necrosis of left femoral head ([place]) 07/30/2015   Avascular necrosis of right femoral head ([place]) 07/30/2015   Psoriasis 07/30/2015   Chronic obstructive pulmonary disease (COPD) ([place]) 07/30/2015   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    Past Medical History:  Diagnosis Date   Arthritis    also has avascular necrosis    Chronic back pain    Chronic neck pain    Chronic shoulder pain    COPD (chronic obstructive pulmonary disease) (H[facility])    GERD (gastroesophageal reflux disease)    tums  OTC  medication   Headache    HTN (hypertension)    Hypothyroidism    Psoriatic arthritis ([place])    Sleep apnea    tested more than 5 yrs ago...negative results    Family History  Problem Relation Age of Onset   Heart failure Mother    Diabetes Mother    Hypertension Mother    Heart failure Father    Prostate cancer Father    Arthritis Father    Hypertension Sister    Heart attack Sister    Hypercholesterolemia Sister    Healthy Daughter    Colon cancer Neg Hx    Gastric cancer Neg Hx    Esophageal cancer Neg Hx    Past Surgical History:  Procedure Laterality Date   COLONOSCOPY WITH PROPOFOL N/A 12/09/2015   Procedure: COLONOSCOPY WITH PROPOFOL;  Surgeon: Daneil Dolin, MD;  [facility];  Service: Endoscopy;  Laterality: N/A;  1100   ESOPHAGOGASTRODUODENOSCOPY (EGD) WITH PROPOFOL N/A 12/09/2015   Procedure: ESOPHAGOGASTRODUODENOSCOPY (EGD) WITH PROPOFOL;  Surgeon: Daneil Dolin, MD;  [facility];  Service: Endoscopy;  Laterality: N/A;   ESOPHAGOGASTRODUODENOSCOPY (EGD) WITH PROPOFOL N/A 06/09/2018   Procedure: ESOPHAGOGASTRODUODENOSCOPY (EGD) WITH PROPOFOL;  Surgeon: Daneil Dolin, MD;  [facility];  Service: Endoscopy;  Laterality: N/A;  8:00am   JOINT REPLACEMENT     left hip 2017   MALONEY DILATION N/A 06/09/2018   Procedure: MALONEY  DILATION;  Surgeon: Daneil Dolin, MD;  [facility];  Service: Endoscopy;  Laterality: N/A;   NECK SURGERY     SHOULDER SURGERY     TOTAL HIP ARTHROPLASTY Left 07/30/2015   Procedure: TOTAL HIP ARTHROPLASTY;  Surgeon: Garald Balding, MD;  Location: [facility];  Service: Orthopedics;  Laterality: Left;   TOTAL HIP ARTHROPLASTY Right 09/22/2016   TOTAL HIP ARTHROPLASTY Right 09/22/2016   Procedure: RIGHT TOTAL HIP ARTHROPLASTY;  Surgeon: Garald Balding, MD;  Location: [facility];  Service: Orthopedics;  Laterality: Right;   Social History   Social History Narrative   Not on file   Immunization History  Administered Date(s) Administered   Pneumococcal Polysaccharide-23 09/23/2016   Tdap 04/16/2016     Objective: Vital Signs: BP (!) 149/89 (BP Location: Left Arm, Patient Position: Sitting, Cuff Size: Normal)    Pulse 74    Resp 14    Ht 6' (1.829 m)    Wt 199 lb 3.2 oz (90.4 kg)    BMI 27.02 kg/m    Physical Exam Vitals signs and nursing note reviewed.  Constitutional:  Appearance: He is well-developed.  HENT:     Head: Normocephalic and atraumatic.  Eyes:     Conjunctiva/sclera: Conjunctivae normal.     Pupils: Pupils are equal, round, and reactive to light.  Neck:     Musculoskeletal: Normal range of motion and neck supple.  Cardiovascular:     Rate and Rhythm: Normal rate and regular rhythm.     Heart sounds: Normal heart sounds.  Pulmonary:     Effort: Pulmonary effort is normal.     Breath sounds: Normal breath sounds.  Abdominal:     General: Bowel sounds are normal.     Palpations: Abdomen is soft.  Skin:    General: Skin is warm and dry.     Capillary Refill: Capillary refill takes less than 2 seconds.  Neurological:     Mental Status: He is alert and oriented to person, place, and time.  Psychiatric:        Behavior: Behavior normal.      Musculoskeletal Exam: C-spine good ROM.  Thoracic spine and lumbar spine limited ROM with  discomfort.  Midline spinal tenderness in the lumbar region.  Bilateral SI joint tenderness.  Shoulder joints, elbow joints, wrist joints, MCPs, PIPs, and DIPs good ROM with no synovitis.  Hip joints difficult to assess due to lower back pain.  Knee joints and ankle joints good ROM with no tenderness or inflammation.   CDAI Exam: CDAI Score: -- Patient Global: --; Provider Global: -- Swollen: --; Tender: -- Joint Exam   No joint exam has been documented for this visit   There is currently no information documented on the homunculus. Go to the Rheumatology activity and complete the homunculus joint exam.  Investigation: No additional findings.  Imaging: No results found.  Recent Labs: Lab Results  Component Value Date   WBC 5.9 07/07/2018   HGB 15.6 07/07/2018   PLT 179 07/07/2018   NA 140 07/07/2018   K 4.6 07/07/2018   CL 107 07/07/2018   CO2 25 07/07/2018   GLUCOSE 94 07/07/2018   BUN 13 07/07/2018   CREATININE 0.91 07/07/2018   BILITOT 0.6 07/07/2018   ALKPHOS 44 09/10/2016   AST 18 07/07/2018   ALT 15 07/07/2018   PROT 7.0 07/07/2018   ALBUMIN 4.4 09/10/2016   CALCIUM 9.8 07/07/2018   GFRAA 117 07/07/2018   QFTBGOLDPLUS NEGATIVE 02/23/2018    Speciality Comments: Prior therapy: Enbrel  Procedures:  No procedures performed Allergies: Patient has no known allergies.   Assessment / Plan:     Visit Diagnoses: Psoriatic arthritis ([place]): He has no synovitis or dactylitis on exam.  He has not had any recent psoriatic arthritis flares.  He is clinically doing well on Cosentyx 300 mg subcutaneous injections every 28 days.  He has tenderness of bilateral SI joints.  He presents today with increased midline spinal tenderness in the lumbar region and symptoms of radiculopathy bilaterally.  He had an epidural steroid injection performed on 07/28/2018 by Dr. Ernestina Patches.  Experiencing significant pain relief for about 3 weeks but the pain has returned.  He was advised to follow-up  with Dr. Ernestina Patches for further evaluation.  He may benefit from facet joint injections in the future.  He has no other joint pain or joint swelling at this time.  He has no Achilles tendinitis or plantar fasciitis.  He has no psoriasis.  He is clinically doing well on Cosentyx and will continue on it as prescribed.  He does not need any refills at  this time.  He was advised to notify us if he develops increased joint pain or joint swelling.  He will follow-up in the office in 3 months.  Psoriasis: He has no psoriasis at this time.   High risk medications (not anticoagulants) long-term use - Cosentyx 300 mg every 28 days.  He started on Cosentyx in June 2018.  He had an inadequate response to Enbrel in the past.  Last TB gold negative 02/23/2018 and will monitor yearly.  Most recent CBC/CMP within normal limits on 07/07/2018.  He will return for lab work in October and every 3 months.  He was advised to hold cosentyx if he develops signs or symptoms of an infection and to resume once the infection has completely cleared.   Bilateral sacroiliitis [facility]): He has bilateral SI joint tenderness.    DDD (degenerative disc disease), cervical: He has good ROM with some discomfort.  No symptoms of radiculopathy.    DDD (degenerative disc disease), lumbar -He has chronic lower back pain.  He presents today with increased lower back pain and symptoms of radiculopathy bilaterally for the past 1 week.  He has had any recent injuries or falls.  He had a epidural steroid injection performed on 07/28/2018 by Dr. Ernestina Patches.  He reports he had significant pain relief for about 3 weeks but the pain has returned.  He is having some weakness in his lower extremities.  He has been experiencing nocturnal pain.  He takes Percocet as needed every 4 hours, Flexeril and gabapentin as prescribed.  He was advised to follow-up with Dr. Ernestina Patches for further evaluation and treatment.  He may benefit from facet joint injections in the future.     History of total replacement of both hip joints - due to AVN: He has limited ROM with discomfort.   Primary insomnia: He experiences nocturnal pain that causes interrupted sleep at night.   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  No orders of the defined types were placed in this encounter.   Follow-Up Instructions: Return in 3 months (on 12/21/2018) for Psoriatic arthritis, DDD.   Ofilia Neas, PA-C   I examined and evaluated the patient with Hazel Sams PA.  Patient had no synovitis on examination.  His psoriatic arthritis seems to be very well controlled with Cosentyx.  He continues to have a lot of lower back pain and radiculopathy to his bilateral lower extremities.  We reviewed the x-ray of his lumbar spine and MRI results.  I have advised him to schedule another appointment with Dr. Margot Ables for possible facet joint injections.  His sedimentation rate obtained recently was also normal.  The plan of care was discussed as noted above.  Bo Merino, MD  Note - This record has been created using Editor, commissioning.  Chart creation errors have been sought, but may not always  have been located. Such creation errors do not reflect on  the standard of medical care.

## 2018-09-21 ENCOUNTER — Encounter: Payer: Self-pay | Admitting: *Deleted

## 2018-09-21 ENCOUNTER — Other Ambulatory Visit: Payer: Self-pay

## 2018-09-21 ENCOUNTER — Ambulatory Visit: Payer: Managed Care, Other (non HMO) | Admitting: Rheumatology

## 2018-09-21 ENCOUNTER — Encounter: Payer: Self-pay | Admitting: Rheumatology

## 2018-09-21 VITALS — BP 149/89 | HR 74 | Resp 14 | Ht 72.0 in | Wt 199.2 lb

## 2018-09-21 DIAGNOSIS — M461 Sacroiliitis, not elsewhere classified: Secondary | ICD-10-CM

## 2018-09-21 DIAGNOSIS — L4050 Arthropathic psoriasis, unspecified: Secondary | ICD-10-CM | POA: Diagnosis not present

## 2018-09-21 DIAGNOSIS — M5030 Other cervical disc degeneration, unspecified cervical region: Secondary | ICD-10-CM

## 2018-09-21 DIAGNOSIS — M5136 Other intervertebral disc degeneration, lumbar region: Secondary | ICD-10-CM

## 2018-09-21 DIAGNOSIS — Z79899 Other long term (current) drug therapy: Secondary | ICD-10-CM

## 2018-09-21 DIAGNOSIS — Z96643 Presence of artificial hip joint, bilateral: Secondary | ICD-10-CM

## 2018-09-21 DIAGNOSIS — L409 Psoriasis, unspecified: Secondary | ICD-10-CM | POA: Diagnosis not present

## 2018-09-21 DIAGNOSIS — M51369 Other intervertebral disc degeneration, lumbar region without mention of lumbar back pain or lower extremity pain: Secondary | ICD-10-CM

## 2018-09-21 DIAGNOSIS — G5712 Meralgia paresthetica, left lower limb: Secondary | ICD-10-CM

## 2018-09-21 DIAGNOSIS — F5101 Primary insomnia: Secondary | ICD-10-CM

## 2018-09-21 DIAGNOSIS — G5711 Meralgia paresthetica, right lower limb: Secondary | ICD-10-CM

## 2018-09-21 NOTE — Patient Instructions (Signed)
Standing Labs We placed an order today for your standing lab work.    Please come back and get your standing labs in October and every 3 months   We have open lab daily Monday through Thursday from 8:30-12:30 PM and 1:30-4:30 PM and Friday from 8:30-12:30 PM and 1:30 -4:00 PM at the office of Dr. Shaili Deveshwar.   You may experience shorter wait times on Monday and Friday afternoons. The office is located at [address], [address], [place], [place] [address] No appointment is necessary.   Labs are drawn by [facility].  You may receive a bill from [facility] for your lab work.  If you wish to have your labs drawn at another location, please call the office 24 hours in advance to send orders.  If you have any questions regarding directions or hours of operation,  please call [phone].   Just as a reminder please drink plenty of water prior to coming for your lab work. Thanks!   

## 2018-10-07 ENCOUNTER — Telehealth: Payer: Self-pay | Admitting: Rheumatology

## 2018-10-07 NOTE — Telephone Encounter (Signed)
Patient calling because he was suppose to be referred back to Dr. Ernestina Patches for another injection. Patient was here several weeks ago, and has not heard anything yet about appointment. Please call patient to update status of referral. Patient is in a lot of pain.

## 2018-10-07 NOTE — Telephone Encounter (Signed)
Please advise. Patient had an L4-5 IL on 7/23. No new referral and patient has not left any messages with [place]. Will need auth.

## 2018-10-07 NOTE — Telephone Encounter (Signed)
Patient reports he feels like injections did not help. Scheduled for OV.

## 2018-10-07 NOTE — Telephone Encounter (Signed)
So, he had 2 esi at [place] prior to the one time I saw him. My only instruction was to call us after two weeks if not helpful. He did have f/up with Deveshwar. I am happy to see for OV or repeat if helped greatly with last injection.

## 2018-10-18 ENCOUNTER — Ambulatory Visit: Payer: Managed Care, Other (non HMO) | Admitting: Internal Medicine

## 2018-10-25 ENCOUNTER — Ambulatory Visit: Payer: Managed Care, Other (non HMO) | Admitting: Physical Medicine and Rehabilitation

## 2018-10-28 ENCOUNTER — Ambulatory Visit: Payer: Managed Care, Other (non HMO) | Admitting: Physician Assistant

## 2018-11-18 ENCOUNTER — Ambulatory Visit: Payer: Managed Care, Other (non HMO) | Admitting: Internal Medicine

## 2018-11-29 ENCOUNTER — Ambulatory Visit: Payer: Managed Care, Other (non HMO) | Admitting: Internal Medicine

## 2018-12-09 ENCOUNTER — Ambulatory Visit: Payer: Managed Care, Other (non HMO) | Admitting: Rheumatology

## 2018-12-23 ENCOUNTER — Ambulatory Visit: Payer: Managed Care, Other (non HMO) | Admitting: Rheumatology

## 2019-02-24 ENCOUNTER — Other Ambulatory Visit: Payer: Self-pay

## 2019-02-24 ENCOUNTER — Encounter (HOSPITAL_COMMUNITY): Payer: Self-pay | Admitting: Emergency Medicine

## 2019-02-24 ENCOUNTER — Emergency Department (HOSPITAL_COMMUNITY)
Admission: EM | Admit: 2019-02-24 | Discharge: 2019-02-24 | Disposition: A | Discharge status: Home / Self Care | Payer: Managed Care, Other (non HMO) | Attending: Emergency Medicine | Admitting: Emergency Medicine

## 2019-02-24 DIAGNOSIS — J449 Chronic obstructive pulmonary disease, unspecified: Secondary | ICD-10-CM | POA: Diagnosis not present

## 2019-02-24 DIAGNOSIS — F17210 Nicotine dependence, cigarettes, uncomplicated: Secondary | ICD-10-CM | POA: Insufficient documentation

## 2019-02-24 DIAGNOSIS — I10 Essential (primary) hypertension: Secondary | ICD-10-CM | POA: Insufficient documentation

## 2019-02-24 DIAGNOSIS — K625 Hemorrhage of anus and rectum: Secondary | ICD-10-CM | POA: Diagnosis present

## 2019-02-24 DIAGNOSIS — Z79899 Other long term (current) drug therapy: Secondary | ICD-10-CM | POA: Insufficient documentation

## 2019-02-24 DIAGNOSIS — E039 Hypothyroidism, unspecified: Secondary | ICD-10-CM | POA: Insufficient documentation

## 2019-02-24 DIAGNOSIS — Z96643 Presence of artificial hip joint, bilateral: Secondary | ICD-10-CM | POA: Diagnosis not present

## 2019-02-24 LAB — COMPREHENSIVE METABOLIC PANEL
ALT: 15 U/L (ref 0–44)
AST: 18 U/L (ref 15–41)
Albumin: 4.3 g/dL (ref 3.5–5.0)
Alkaline Phosphatase: 42 U/L (ref 38–126)
Anion gap: 9 (ref 5–15)
BUN: 13 mg/dL (ref 6–20)
CO2: 23 mmol/L (ref 22–32)
Calcium: 9.2 mg/dL (ref 8.9–10.3)
Chloride: 104 mmol/L (ref 98–111)
Creatinine, Ser: 0.89 mg/dL (ref 0.61–1.24)
GFR calc Af Amer: >60 mL/min (ref >60)
GFR calc non Af Amer: >60 mL/min (ref >60)
Glucose, Bld: 108 mg/dL — ABNORMAL HIGH (ref 70–99)
Potassium: 4.3 mmol/L (ref 3.5–5.1)
Sodium: 136 mmol/L (ref 135–145)
Total Bilirubin: 0.7 mg/dL (ref 0.3–1.2)
Total Protein: 6.9 g/dL (ref 6.5–8.1)

## 2019-02-24 LAB — TYPE AND SCREEN
ABO/RH(D): A POS
Antibody Screen: NEGATIVE

## 2019-02-24 LAB — CBC
HCT: 45.5 % (ref 39.0–52.0)
Hemoglobin: 15.4 g/dL (ref 13.0–17.0)
MCH: 32.7 pg (ref 26.0–34.0)
MCHC: 33.8 g/dL (ref 30.0–36.0)
MCV: 96.6 fL (ref 80.0–100.0)
Platelets: 158 10*3/uL (ref 150–400)
RBC: 4.71 MIL/uL (ref 4.22–5.81)
RDW: 11.7 % (ref 11.5–15.5)
WBC: 5.8 10*3/uL (ref 4.0–10.5)
nRBC: 0 % (ref 0.0–0.2)

## 2019-02-24 LAB — POC OCCULT BLOOD, ED: Fecal Occult Bld: POSITIVE — AB

## 2019-02-24 NOTE — ED Provider Notes (Signed)
[facility] EMERGENCY DEPARTMENT Provider Note   CSN: [id number] Arrival date & time: 02/24/19  1154     History Chief Complaint  Patient presents with  . Rectal Bleeding    Charles [name] is a 49 y.o. male with history of GERD, erosive reflux esophagitis, hx of tubular adenoma, and internal hemorrhoids who presents with rectal bleeding. The patient states he has had stomach issues "for a while". He sees Dr. Gala Romney with GI. He last had an EGD in June 2020 which showed mild esophagitis and a non-critical schiatzki's ring which was dilated. He was recommended to increase his protonix to BID which he has been doing. Today he had a BM and noted that there was dark red blood mixed in with the stool. He reports associated periumbilical abdominal discomfort. He also has issues with occasional dysphagia, nausea, and fecal urgency. He had banding of hemorrhoids last September and states that he will see an occasional small amount of bleeding on tissue when he wipes but this episodes is worse than usual. He tried to call the GI clinic but office was closed so he came to the ED. He reports feeling somewhat lightheaded after the bleeding but denies syncope. He denies significant NSAID use. He does drink 2-6 alcoholic drinks daily.  HPI     Past Medical History:  Diagnosis Date  . Arthritis    also has avascular necrosis   . Chronic back pain   . Chronic neck pain   . Chronic shoulder pain   . COPD (chronic obstructive pulmonary disease) ([place])   . GERD (gastroesophageal reflux disease)    tums  OTC  medication  . Headache   . HTN (hypertension)   . Hypothyroidism   . Psoriatic arthritis ([place])   . Sleep apnea    tested more than 5 yrs ago...negative results    Patient Active Problem List   Diagnosis Date Noted  . GERD (gastroesophageal reflux disease) 05/25/2018  . Dysphagia 05/25/2018  . Acute left-sided low back pain with left-sided sciatica 02/01/2018  . Pain in left hip 02/01/2018  .  Constipation 05/11/2017  . Chronic right-sided low back pain with right-sided sciatica 04/05/2017  . 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 Bilateral sacroiliitis ([place]) 05/22/2016  . Psoriatic arthritis ([place]) 05/22/2016  . High risk medications (not anticoagulants) long-term use 05/22/2016  . DJD (degenerative joint disease), cervical 05/22/2016  . Osteoarthritis of spine with radiculopathy, lumbar region 05/22/2016  . Cephalalgia 03/16/2016  . Chronic daily headache 03/16/2016  . Neck pain 03/16/2016  . Hemorrhoids 03/13/2016  . 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 Insomnia 12/26/2015  . Abdominal pain, epigastric 11/13/2015  . Dark stools 11/13/2015  . Rectal bleeding 11/13/2015  . Numbness 09/26/2015  . 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 Avascular necrosis of left femoral head ([employer]) 07/30/2015  . Avascular necrosis of right femoral head ([place]) 07/30/2015  . Psoriasis 07/30/2015  . Chronic obstructive pulmonary disease (COPD) ([place]) 07/30/2015  . 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    Past Surgical History:  Procedure Laterality Date  . COLONOSCOPY WITH PROPOFOL N/A 12/09/2015   Procedure: COLONOSCOPY WITH PROPOFOL;  Surgeon: Daneil Dolin, MD;  [facility];  Service: Endoscopy;  Laterality: N/A;  1100  . ESOPHAGOGASTRODUODENOSCOPY (EGD) WITH PROPOFOL N/A 12/09/2015   Procedure: ESOPHAGOGASTRODUODENOSCOPY (EGD) WITH PROPOFOL;  Surgeon: Daneil Dolin, MD;  [facility];  Service:  Endoscopy;  Laterality: N/A;  . ESOPHAGOGASTRODUODENOSCOPY (EGD) WITH PROPOFOL N/A 06/09/2018   Procedure: ESOPHAGOGASTRODUODENOSCOPY (EGD) WITH PROPOFOL;  Surgeon: Daneil Dolin, MD;  [facility];  Service: Endoscopy;  Laterality: N/A;  8:00am  . JOINT REPLACEMENT     left hip 2017  . MALONEY DILATION N/A 06/09/2018   Procedure: Venia Minks DILATION;  Surgeon: Daneil Dolin, MD;  [facility];  Service: Endoscopy;  Laterality: N/A;  . NECK SURGERY    . SHOULDER SURGERY    . TOTAL HIP ARTHROPLASTY Left 07/30/2015   Procedure: TOTAL HIP ARTHROPLASTY;  Surgeon: Garald Balding, MD;  Location: [place];  Service: Orthopedics;  Laterality: Left;  . TOTAL HIP ARTHROPLASTY Right 09/22/2016  . TOTAL HIP ARTHROPLASTY Right 09/22/2016   Procedure: RIGHT TOTAL HIP ARTHROPLASTY;  Surgeon: Garald Balding, MD;  Location: [place];  Service: Orthopedics;  Laterality: Right;       Family History  Problem Relation Age of Onset  . Heart failure Mother   . Diabetes Mother   . Hypertension Mother   . Heart failure Father   . Prostate cancer Father   . Arthritis Father   . Hypertension Sister   . Heart attack Sister   . Hypercholesterolemia Sister   . Healthy Daughter   . Colon cancer Neg Hx   . Gastric cancer Neg Hx   . Esophageal cancer Neg Hx     Social History   Tobacco Use  . Smoking status: Current Every Day Smoker    Packs/day: 0.50    Years: 25.00    Pack years: 12.50    Types: Cigarettes  . Smokeless tobacco: Never Used  Substance Use Topics  . Alcohol use: Yes    Alcohol/week: 16.0 standard drinks    Types: 16 Cans of beer per week  . Drug use: No    Comment: states quit    Home Medications Prior to Admission medications   Medication Sig Start Date End Date Taking? Authorizing Provider  albuterol (PROVENTIL HFA;VENTOLIN HFA) 108 (90 Base) MCG/ACT inhaler Inhale 1-2 puffs into the lungs every 4 (four) hours as needed for wheezing or shortness of breath.  12/21/17   [provider]  cyclobenzaprine (FLEXERIL) 5 MG tablet Take 5 mg by mouth 3 (three) times daily as needed for muscle spasms.  06/05/16   [provider]  gabapentin (NEURONTIN) 300 MG capsule Take 1 tablet at bedtime Patient taking differently: Take 300 mg by mouth at bedtime. Take 1 tablet at bedtime 02/01/18   Garald Balding, MD  Multiple  Vitamins-Minerals (MULTIVITAMIN WITH MINERALS) tablet Take 1 tablet by mouth daily.    [provider]  oxyCODONE-acetaminophen (PERCOCET/ROXICET) 5-325 MG tablet Take 1-2 tablets by mouth every 4 (four) hours as needed for pain.  05/06/18   [provider]  pantoprazole (PROTONIX) 40 MG tablet Take 40 mg by mouth daily. 11/05/15   [provider]  Secukinumab, 300 MG Dose, (COSENTYX SENSOREADY, 300 MG,) 150 MG/ML SOAJ Inject 300 mg into the skin every 30 (thirty) days. 02/18/18   Bo Merino, MD    Allergies    Patient has no known allergies.  Review of Systems   Review of Systems  Constitutional: Negative for chills and fever.  Respiratory: Negative for shortness of breath.   Cardiovascular: Negative for chest pain.  Gastrointestinal: Positive for abdominal pain, blood in stool and nausea. Negative for constipation, diarrhea, rectal pain and vomiting.  Musculoskeletal: Positive for arthralgias and  back pain.  Neurological: Positive for light-headedness. Negative for syncope.  Hematological: Does not bruise/bleed easily.  All other systems reviewed and are negative.   Physical Exam Updated Vital Signs BP (!) 147/87   Pulse 68   Temp 98.4 F (36.9 C) (Oral)   Resp 12   Ht 6' (1.829 m)   Wt 88.5 kg   SpO2 98%   BMI 26.45 kg/m   Physical Exam Vitals and nursing note reviewed.  Constitutional:      General: He is not in acute distress.    Appearance: Normal appearance. He is well-developed. He is not ill-appearing.  HENT:     Head: Normocephalic and atraumatic.  Eyes:     General: No scleral icterus.       Right eye: No discharge.        Left eye: No discharge.     Conjunctiva/sclera: Conjunctivae normal.     Pupils: Pupils are equal, round, and reactive to light.  Cardiovascular:     Rate and Rhythm: Normal rate and regular rhythm.  Pulmonary:     Effort: Pulmonary effort is normal. No respiratory distress.     Breath sounds: Normal  breath sounds.  Abdominal:     General: There is no distension.     Palpations: Abdomen is soft.     Tenderness: There is abdominal tenderness (mild periumbilical tenderness).  Genitourinary:    Comments: Rectal: No gross blood, hemorrhoids, fissures, redness, area of fluctuance, lesions, or tenderness. Hemoccult was +. Chaperone Jocelyn Lamer, [place]) present during exam.  Musculoskeletal:     Cervical back: Normal range of motion.  Skin:    General: Skin is warm and dry.  Neurological:     Mental Status: He is alert and oriented to person, place, and time.  Psychiatric:        Behavior: Behavior normal.     ED Results / Procedures / Treatments   Labs (all labs ordered are listed, but only abnormal results are displayed) Labs Reviewed  COMPREHENSIVE METABOLIC PANEL - Abnormal; Notable for the following components:      Result Value   Glucose, Bld 108 (*)    All other components within normal limits  POC OCCULT BLOOD, ED - Abnormal; Notable for the following components:   Fecal Occult Bld POSITIVE (*)    All other components within normal limits  CBC  TYPE AND SCREEN    EKG None  Radiology No results found.  Procedures Procedures (including critical care time)  Medications Ordered in ED Medications - No data to display  ED Course  I have reviewed the triage vital signs and the nursing notes.  Pertinent labs & imaging results that were available during my care of the patient were reviewed by me and considered in my medical decision making (see chart for details).  49 year old male presents with episode of rectal bleeding today while having a BM. He is hypertensive but otherwise vitals are normal. Abdomen is soft and minimally tender in the periumbilical area which has been an ongoing problem. Rectal exam is unremarkable but hemoccult is positive here. Labs are reassuring. I sent a message to Dr. Gala Romney who patient sees as an outpatient. He thinks symptoms may be from hemorrhoids  and advised to have pt avoid straining or constipation and he will have him f/u in the office early next week. Discussed plan of care with pt - he is agreeable.  MDM Rules/Calculators/A&P  Final Clinical Impression(s) / ED Diagnoses Final diagnoses:  Rectal bleeding    Rx / DC Orders ED Discharge Orders    None       Recardo Evangelist, PA-C 02/24/19 1453    Veryl Speak, MD 02/24/19 1551

## 2019-02-24 NOTE — ED Triage Notes (Signed)
Patient complains of rectal bleeding with BM that started this morning. Denies N/V/D and fevers. Patient states his last colonoscopy was a few years ago.

## 2019-02-24 NOTE — Discharge Instructions (Signed)
Please avoid any straining to have a BM  Dr. Roseanne Kaufman office will arrange follow up for you next week Please return to the Emergency Department if you are worsening over the weekend

## 2019-03-03 ENCOUNTER — Encounter: Payer: Self-pay | Admitting: Gastroenterology

## 2019-03-03 ENCOUNTER — Other Ambulatory Visit: Payer: Self-pay

## 2019-03-03 ENCOUNTER — Ambulatory Visit (INDEPENDENT_AMBULATORY_CARE_PROVIDER_SITE_OTHER): Payer: Managed Care, Other (non HMO) | Admitting: Gastroenterology

## 2019-03-03 VITALS — BP 135/75 | HR 79 | Temp 97.7°F | Ht 72.0 in | Wt 193.8 lb

## 2019-03-03 DIAGNOSIS — K640 First degree hemorrhoids: Secondary | ICD-10-CM | POA: Diagnosis not present

## 2019-03-03 MED ORDER — HYDROCORTISONE (PERIANAL) 2.5 % EX CREA: 1.0000 "application " | TOPICAL_CREAM | Freq: Two times a day (BID) | CUTANEOUS | 1 refills | Status: DC

## 2019-03-03 MED ORDER — LIDOCAINE 5 % EX OINT: 1.0000 "application " | TOPICAL_OINTMENT | Freq: Three times a day (TID) | CUTANEOUS | 0 refills | Status: DC | PRN

## 2019-03-03 NOTE — Progress Notes (Signed)
[place] Banding Note:  Charles [name] is a 49 year old male presenting today with history of symptomatic hemorrhoids, previously banding left lateral and right anterior columns several years ago (2018). He was seen Sept 2020 by Dr. Gala Romney and the right posterior column was banded. Neutral position banding also completed. Colonoscopy on file from 2017 and due again next year for surveillance.    The patient presents with symptomatic grade 1 hemorrhoids, unresponsive to maximal medical therapy, requesting rubber band ligation of his  hemorrhoidal disease. All risks, benefits, and alternative forms of therapy were described and informed consent was obtained.  In the left lateral decubitus position  anoscopic examination revealed grade 1 hemorrhoids in the right anterior and left lateral position (s).  The decision was made initially to band the right anterior, but patient had discomfort despite multiple maneuvers and difficult to relax, so I turned attention to the left lateral column. This was completed without issues, and he noted some discomfort after band deployed. I then manipulated the band, which provided relief.   Upon further discussion with patient, he does note pain with bowel movements. Limited history. Will start with Anusol and lidocaine; he is to call in next week regarding symptoms. If no improvement, will add compounded nitro from [place]. At this point, hold off on further banding until sorted out further. He will return for office visit in 4-6 weeks for routine visit. Continue with Benefiber. Start Linzess 72 mcg samples.   Annitta Needs, PhD, ANP-BC [facility]

## 2019-03-03 NOTE — Patient Instructions (Signed)
I feel you may be dealing with a fissure as well. We may need to ramp up the creams, but for now start using the Anusol cream twice a day per rectum. Add lidocaine ointment as needed three to four times per day.   Continue with Benefiber. I have given you samples of Linzess to take once each morning on an empty stomach to avoid diarrhea. You may have loose stool with this starting out, but it should get better. IF not or not strong enough, let us know.  If bleeding continues, we may need to update a look at your colon sooner rather than later.   We can see you back in about 4-6 weeks for just a regular visit; if banding is needed at that time, we can do it then.   It was a pleasure to see you today. I want to create trusting relationships with patients to provide genuine, compassionate, and quality care. I value your feedback. If you receive a survey regarding your visit,  I greatly appreciate you taking time to fill this out.   Annitta Needs, PhD, ANP-BC [facility]

## 2019-04-04 ENCOUNTER — Encounter: Payer: Self-pay | Admitting: Gastroenterology

## 2019-04-04 ENCOUNTER — Ambulatory Visit: Payer: Managed Care, Other (non HMO) | Admitting: Gastroenterology

## 2019-04-04 ENCOUNTER — Other Ambulatory Visit: Payer: Self-pay

## 2019-04-04 VITALS — BP 133/82 | HR 78 | Temp 97.5°F | Ht 72.0 in | Wt 198.2 lb

## 2019-04-04 DIAGNOSIS — K649 Unspecified hemorrhoids: Secondary | ICD-10-CM | POA: Diagnosis not present

## 2019-04-04 DIAGNOSIS — K2100 Gastro-esophageal reflux disease with esophagitis, without bleeding: Secondary | ICD-10-CM | POA: Diagnosis not present

## 2019-04-04 MED ORDER — PANTOPRAZOLE SODIUM 40 MG PO TBEC: 40.0000 mg | DELAYED_RELEASE_TABLET | Freq: Every day | ORAL | 3 refills | Status: DC

## 2019-04-04 NOTE — Progress Notes (Signed)
Referring Provider: Celene Squibb, MD Primary Care Physician:  Celene Squibb, MD Primary GI: Dr. Gala Romney   Chief Complaint  Patient presents with  . Hemorrhoids    HPI:   Charles [name] is a 49 y.o. male presenting today with a history of symptomatic hemorrhoids, previously banding left lateral and right anterior columns several years ago (2018). He was seen Sept 2020 by Dr. Gala Romney and the right posterior column was banded. Neutral position banding also completed. Colonoscopy on file from 2017 and due again next year for surveillance. He was seen again Feb 2021 with persistent symptoms, and I banded the left lateral column. Due to rectal discomfort and pain with bowel movements, started Anusol and lidocaine with plans to add nitro if no improvement, as history was vague at time of visit. Felt he may have an evolving or occult fissure. Started on Linzess 72 mcg samples. Returns today in follow-up.   No rectal pain. No straining. No rectal bleeding. No itching or burning. No constipation. BM daily. 5-6 minutes toilet time. No GERD flares. Protonix once daily. Usually spicy foods will flare. No abdominal pain.    Past Medical History:  Diagnosis Date  . Arthritis    also has avascular necrosis   . Chronic back pain   . Chronic neck pain   . Chronic shoulder pain   . COPD (chronic obstructive pulmonary disease) ([place])   . GERD (gastroesophageal reflux disease)    tums  OTC  medication  . Headache   . HTN (hypertension)   . Hypothyroidism   . Psoriatic arthritis ([place])   . Sleep apnea    tested more than 5 yrs ago...negative results    Past Surgical History:  Procedure Laterality Date  . COLONOSCOPY WITH PROPOFOL N/A 12/09/2015   four 5 mm polyps in sigmoid colon and descending colon. Pathology revealed tubular adenomas. Recommended repeat in 5 years.  . ESOPHAGOGASTRODUODENOSCOPY (EGD) WITH PROPOFOL N/A 12/09/2015   Procedure: ESOPHAGOGASTRODUODENOSCOPY (EGD) WITH PROPOFOL;  Surgeon:  Daneil Dolin, MD;  [facility];  Service: Endoscopy;  Laterality: N/A;  . ESOPHAGOGASTRODUODENOSCOPY (EGD) WITH PROPOFOL N/A 06/09/2018   mild erosive reflux esophagitis, non-critical Schatzki's ring s/p dilation  . JOINT REPLACEMENT     left hip 2017  . MALONEY DILATION N/A 06/09/2018   Procedure: Venia Minks DILATION;  Surgeon: Daneil Dolin, MD;  [facility];  Service: Endoscopy;  Laterality: N/A;  . NECK SURGERY    . SHOULDER SURGERY    . TOTAL HIP ARTHROPLASTY Left 07/30/2015   Procedure: TOTAL HIP ARTHROPLASTY;  Surgeon: Garald Balding, MD;  Location: [place];  Service: Orthopedics;  Laterality: Left;  . TOTAL HIP ARTHROPLASTY Right 09/22/2016  . TOTAL HIP ARTHROPLASTY Right 09/22/2016   Procedure: RIGHT TOTAL HIP ARTHROPLASTY;  Surgeon: Garald Balding, MD;  Location: [place];  Service: Orthopedics;  Laterality: Right;    Current Outpatient Medications  Medication Sig Dispense Refill  . albuterol (PROVENTIL HFA;VENTOLIN HFA) 108 (90 Base) MCG/ACT inhaler Inhale 1-2 puffs into the lungs every 4 (four) hours as needed for wheezing or shortness of breath.     . cyclobenzaprine (FLEXERIL) 5 MG tablet Take 5 mg by mouth as needed for muscle spasms.     . hydrocortisone (ANUSOL-HC) 2.5 % rectal cream Place 1 application rectally 2 (two) times daily. 30 g 1  . lidocaine (XYLOCAINE) 5 % ointment Apply 1 application topically 3 (three) times daily as needed. To rectum 30  g 0  . oxyCODONE-acetaminophen (PERCOCET/ROXICET) 5-325 MG tablet Take 1-2 tablets by mouth every 4 (four) hours as needed for pain. Takes 10-325 mg 3 times daily.    . pantoprazole (PROTONIX) 40 MG tablet Take 1 tablet (40 mg total) by mouth daily. 90 tablet 3   No current facility-administered medications for this visit.    Allergies as of 04/04/2019  . (No Known Allergies)    Family History  Problem Relation Age of Onset  . Heart failure Mother   . Diabetes Mother   . Hypertension Mother   .  Heart failure Father   . Prostate cancer Father   . Arthritis Father   . Hypertension Sister   . Heart attack Sister   . Hypercholesterolemia Sister   . Healthy Daughter   . Colon cancer Neg Hx   . Gastric cancer Neg Hx   . Esophageal cancer Neg Hx     Social History   Socioeconomic History  . Marital status: Married    Spouse name: Not on file  . Number of children: Not on file  . Years of education: Not on file  . Highest education level: Not on file  Occupational History  . Not on file  Tobacco Use  . Smoking status: Current Every Day Smoker    Packs/day: 0.50    Years: 25.00    Pack years: 12.50    Types: Cigarettes  . Smokeless tobacco: Never Used  Substance and Sexual Activity  . Alcohol use: Yes    Alcohol/week: 16.0 standard drinks    Types: 16 Cans of beer per week  . Drug use: No    Comment: states quit  . Sexual activity: Not on file  Other Topics Concern  . Not on file  Social History Narrative  . Not on file   Social Determinants of Health   Financial Resource Strain:   . Difficulty of Paying Living Expenses:   Food Insecurity:   . Worried About Charity fundraiser in the Last Year:   . Arboriculturist in the Last Year:   Transportation Needs:   . Film/video editor (Medical):   Marland Kitchen Lack of Transportation (Non-Medical):   Physical Activity:   . Days of Exercise per Week:   . Minutes of Exercise per Session:   Stress:   . Feeling of Stress :   Social Connections:   . Frequency of Communication with Friends and Family:   . Frequency of Social Gatherings with Friends and Family:   . Attends Religious Services:   . Active Member of Clubs or Organizations:   . Attends Archivist Meetings:   Marland Kitchen Marital Status:     Review of Systems: Gen: Denies fever, chills, anorexia. Denies fatigue, weakness, weight loss.  CV: Denies chest pain, palpitations, syncope, peripheral edema, and claudication. Resp: Denies dyspnea at rest, cough,  wheezing, coughing up blood, and pleurisy. GI: see HPI Derm: Denies rash, itching, dry skin Psych: Denies depression, anxiety, memory loss, confusion. No homicidal or suicidal ideation.  Heme: Denies bruising, bleeding, and enlarged lymph nodes.  Physical Exam: BP 133/82   Pulse 78   Temp (!) 97.5 F (36.4 C) (Temporal)   Ht 6' (1.829 m)   Wt 198 lb 3.2 oz (89.9 kg)   BMI 26.88 kg/m  General:   Alert and oriented. No distress noted. Pleasant and cooperative.  Head:  Normocephalic and atraumatic. Eyes:  Conjuctiva clear without scleral icterus. Mouth:  Mask in place  Abdomen:  +BS, soft, non-tender and non-distended. No rebound or guarding. No HSM or masses noted. Msk:  Symmetrical without gross deformities. Normal posture. Extremities:  Without edema. Neurologic:  Alert and  oriented x4 Psych:  Alert and cooperative. Normal mood and affect.  ASSESSMENT: RHONAN SCOBEY is a 49 y.o. male presenting today with history of symptomatic hemorrhoids, s/p banding previously and repeat banding in Feb 2021 of left lateral columns. Doing well today without bleeding, pain, itching, burning, and denying constipation. GERD remains well controlled on Protonix daily without any alarm signs/symptoms.  As he is doing well, will see him in 1 year or sooner if needed.    PLAN:   Continue Protonix daily  Call if recurrent symptoms  Next colonoscopy Dec 2022  Return in 1 year   Annitta Needs, PhD, [facility]

## 2019-04-04 NOTE — Patient Instructions (Signed)
Please call if further rectal pain, itching, burning, or bleeding. Avoid straining as you are doing. Limit toilet time to 2-3 minutes.  Continue Protonix as you are doing. Make sure it is 30 minutes before breakfast each morning for best absorption.  We will see you back in 1 year or sooner if needed!  I enjoyed seeing you again today! As you know, I value our relationship and want to provide genuine, compassionate, and quality care. I welcome your feedback. If you receive a survey regarding your visit,  I greatly appreciate you taking time to fill this out. See you next time!  Annitta Needs, PhD, ANP-BC [facility]

## 2019-05-17 ENCOUNTER — Ambulatory Visit: Payer: Managed Care, Other (non HMO) | Admitting: Orthopaedic Surgery

## 2019-05-23 ENCOUNTER — Ambulatory Visit: Payer: Managed Care, Other (non HMO) | Admitting: Orthopedic Surgery

## 2019-08-09 ENCOUNTER — Ambulatory Visit (INDEPENDENT_AMBULATORY_CARE_PROVIDER_SITE_OTHER): Payer: Managed Care, Other (non HMO)

## 2019-08-09 ENCOUNTER — Encounter: Payer: Self-pay | Admitting: Orthopaedic Surgery

## 2019-08-09 ENCOUNTER — Ambulatory Visit: Payer: Self-pay

## 2019-08-09 ENCOUNTER — Ambulatory Visit: Payer: Managed Care, Other (non HMO) | Admitting: Orthopaedic Surgery

## 2019-08-09 VITALS — Ht 72.0 in | Wt 200.0 lb

## 2019-08-09 DIAGNOSIS — M25551 Pain in right hip: Secondary | ICD-10-CM

## 2019-08-09 DIAGNOSIS — G8929 Other chronic pain: Secondary | ICD-10-CM | POA: Diagnosis not present

## 2019-08-09 DIAGNOSIS — M5441 Lumbago with sciatica, right side: Secondary | ICD-10-CM | POA: Diagnosis not present

## 2019-08-09 NOTE — Progress Notes (Signed)
Office Visit Note   Patient: Charles [name]           Date of Birth: 18-Jun-1970           MRN: [id number] Visit Date: 08/09/2019              Requested by: Celene Squibb, MD [place],  [place] [id number] PCP: Celene Squibb, MD   Assessment & Plan: Visit Diagnoses:  1. Pain in right hip   2. Chronic right-sided low back pain with right-sided sciatica     Plan: Low back pain with right lower extremity radiculopathy.  Will obtain MRI scan of lumbar spine.  Both hip replacements appear to be in good position without complication. Also has a small muscle hernia of the distal third of the peroneal muscles.  Will address this in the future  Follow-Up Instructions: Return After MRI scan lumbar spine.   Orders:  Orders Placed This Encounter  Procedures  . XR Lumbar Spine 2-3 Views  . XR Pelvis 1-2 Views  . MR Lumbar Spine w/o contrast   No orders of the defined types were placed in this encounter.     Procedures: No procedures performed   Clinical Data: No additional findings.   Subjective: Chief Complaint  Patient presents with  . Right Hip - Pain  Patient presents today for right hip pain. He has been hurting for around 2-3 months. No known injury. His pain is located laterally, but also in his buttock and radiates down the lateral side of his leg and into his foot. No groin pain. His leg stays numb. He also experiences a tingling and burning sensation. He has been taking oxycodone as prescribed by his PCP. No position seems to make his pain better. Standing makes the pain worse.   HPI  Review of Systems   Objective: Vital Signs: Ht 6' (1.829 m)   Wt 200 lb (90.7 kg)   BMI 27.12 kg/m   Physical Exam Constitutional:      Appearance: He is well-developed.  Eyes:     Pupils: Pupils are equal, round, and reactive to light.  Pulmonary:     Effort: Pulmonary effort is normal.  Skin:    General: Skin is warm and dry.  Neurological:     Mental  Status: He is alert and oriented to person, place, and time.  Psychiatric:        Behavior: Behavior normal.     Ortho Exam awake alert and oriented x3.  Comfortable sitting.  No pain with range of motion of either hip.  Reflexes were depressed but appeared to be symmetrical in both knees and both ankles.  Motor exam intact.  Minimally positive straight leg raise for right buttock pain on the right.  Negative on the left.  Has a small hernia of the peroneal compartment and the distal third that is minimally uncomfortable.  Specialty Comments:  No specialty comments available.  Imaging: XR Lumbar Spine 2-3 Views  Result Date: 08/09/2019 AP lateral lumbar spine reveals a very minimal right-sided scoliosis.  There are degenerative disc changes at L3-4, L4-5 and L5-S1.  There appears to be some compression of L4.  No listhesis.  Facet sclerosis at L4-5 and L5-S1  XR Pelvis 1-2 Views  Result Date: 08/09/2019 AP pelvis demonstrates bilateral total hip replacements in good position without obvious complication.    PMFS History: Patient Active Problem List   Diagnosis Date Noted  . Grade I hemorrhoids  03/03/2019  . GERD (gastroesophageal reflux disease) 05/25/2018  . Dysphagia 05/25/2018  . Acute left-sided low back pain with left-sided sciatica 02/01/2018  . Pain in left hip 02/01/2018  . Constipation 05/11/2017  . Low back pain 04/05/2017  . 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 Bilateral sacroiliitis ([place]) 05/22/2016  . Psoriatic arthritis ([place]) 05/22/2016  . High risk medications (not anticoagulants) long-term use 05/22/2016  . DJD (degenerative joint disease), cervical 05/22/2016  . Osteoarthritis of spine with radiculopathy, lumbar region 05/22/2016  . Cephalalgia 03/16/2016  . Chronic daily headache 03/16/2016  . Neck pain 03/16/2016  . Hemorrhoids 03/13/2016  . 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 Insomnia 12/26/2015  .  Abdominal pain, epigastric 11/13/2015  . Dark stools 11/13/2015  . Rectal bleeding 11/13/2015  . Numbness 09/26/2015  . 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 Avascular necrosis of left femoral head ([place]) 07/30/2015  . Avascular necrosis of right femoral head ([place]) 07/30/2015  . Psoriasis 07/30/2015  . Chronic obstructive pulmonary disease (COPD) ([place]) 07/30/2015  . 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   Past Medical History:  Diagnosis Date  . Arthritis    also has avascular necrosis   . Chronic back pain   . Chronic neck pain   . Chronic shoulder pain   . COPD (chronic obstructive pulmonary disease) ([place])   . GERD (gastroesophageal reflux disease)    tums  OTC  medication  . Headache   . HTN (hypertension)   . Hypothyroidism   . Psoriatic arthritis ([place])   . Sleep apnea    tested more than 5 yrs ago...negative results    Family History  Problem Relation Age of Onset  . Heart failure Mother   . Diabetes Mother   . Hypertension Mother   . Heart failure Father   . Prostate cancer Father   . Arthritis Father   . Hypertension Sister   . Heart attack Sister   . Hypercholesterolemia Sister   . Healthy Daughter   . Colon cancer Neg Hx   . Gastric cancer Neg Hx   . Esophageal cancer Neg Hx     Past Surgical History:  Procedure Laterality Date  . COLONOSCOPY WITH PROPOFOL N/A 12/09/2015   four 5 mm polyps in sigmoid colon and descending colon. Pathology revealed tubular adenomas. Recommended repeat in 5 years.  . ESOPHAGOGASTRODUODENOSCOPY (EGD) WITH PROPOFOL N/A 12/09/2015   Procedure: ESOPHAGOGASTRODUODENOSCOPY (EGD) WITH PROPOFOL;  Surgeon: Daneil Dolin, MD;  [facility];  Service: Endoscopy;  Laterality: N/A;  . ESOPHAGOGASTRODUODENOSCOPY (EGD) WITH PROPOFOL N/A 06/09/2018   mild erosive reflux esophagitis, non-critical Schatzki's ring s/p dilation  . JOINT REPLACEMENT     left hip 2017  . MALONEY  DILATION N/A 06/09/2018   Procedure: Venia Minks DILATION;  Surgeon: Daneil Dolin, MD;  [facility];  Service: Endoscopy;  Laterality: N/A;  . NECK SURGERY    . SHOULDER SURGERY    . TOTAL HIP ARTHROPLASTY Left 07/30/2015   Procedure: TOTAL HIP ARTHROPLASTY;  Surgeon: Garald Balding, MD;  Location: [facility];  Service: Orthopedics;  Laterality: Left;  . TOTAL HIP ARTHROPLASTY Right 09/22/2016  . TOTAL HIP ARTHROPLASTY Right 09/22/2016   Procedure: RIGHT TOTAL HIP ARTHROPLASTY;  Surgeon: Garald Balding, MD;  Location: [facility];  Service: Orthopedics;  Laterality: Right;   Social History   Occupational History  . Not on file  Tobacco Use  .  Smoking status: Current Every Day Smoker    Packs/day: 0.50    Years: 25.00    Pack years: 12.50    Types: Cigarettes  . Smokeless tobacco: Never Used  Vaping Use  . Vaping Use: Former  Substance and Sexual Activity  . Alcohol use: Yes    Alcohol/week: 16.0 standard drinks    Types: 16 Cans of beer per week  . Drug use: No    Comment: states quit  . Sexual activity: Not on file

## 2019-08-27 ENCOUNTER — Ambulatory Visit
Admission: RE | Admit: 2019-08-27 | Discharge: 2019-08-27 | Disposition: A | Discharge status: Home / Self Care | Payer: Managed Care, Other (non HMO) | Source: Ambulatory Visit | Attending: Orthopaedic Surgery | Admitting: Orthopaedic Surgery

## 2019-08-27 ENCOUNTER — Other Ambulatory Visit: Payer: Self-pay

## 2019-08-27 DIAGNOSIS — M25551 Pain in right hip: Secondary | ICD-10-CM

## 2019-08-31 ENCOUNTER — Other Ambulatory Visit: Payer: Self-pay

## 2019-08-31 ENCOUNTER — Ambulatory Visit (INDEPENDENT_AMBULATORY_CARE_PROVIDER_SITE_OTHER): Payer: Managed Care, Other (non HMO) | Admitting: Orthopaedic Surgery

## 2019-08-31 ENCOUNTER — Encounter: Payer: Self-pay | Admitting: Orthopaedic Surgery

## 2019-08-31 VITALS — Ht 72.0 in | Wt 200.0 lb

## 2019-08-31 DIAGNOSIS — R2241 Localized swelling, mass and lump, right lower limb: Secondary | ICD-10-CM

## 2019-08-31 DIAGNOSIS — M79604 Pain in right leg: Secondary | ICD-10-CM

## 2019-08-31 DIAGNOSIS — M4726 Other spondylosis with radiculopathy, lumbar region: Secondary | ICD-10-CM

## 2019-08-31 DIAGNOSIS — M5441 Lumbago with sciatica, right side: Secondary | ICD-10-CM

## 2019-08-31 DIAGNOSIS — G8929 Other chronic pain: Secondary | ICD-10-CM

## 2019-08-31 NOTE — Progress Notes (Signed)
Office Visit Note   Patient: Charles [name]           Date of Birth: 1970/03/28           MRN: [id number] Visit Date: 08/31/2019              Requested by: Celene Squibb, MD [place],  [place] [id number] PCP: Celene Squibb, MD   Assessment & Plan: Visit Diagnoses:  1. Chronic right-sided low back pain with right-sided sciatica   2. Lower leg mass, right   3. Osteoarthritis of spine with radiculopathy, lumbar region   4. Pain in right leg     Plan: Charles [name] had an MRI scan of his lumbar spine that was compared to the study that was performed 2 years ago.  There is disc degeneration at L2-3 through L5-S1 with reverse lumbar lordosis.  There is moderate bilateral foraminal narrowing at L5-S1.  There was a far lateral disc bulging or protrusion at L4-5 which was highlighted in a prior studies but without obvious neural compression no evidence of stenosis experiencing bilateral leg and thigh pain which I think is related to his back.  He does have bilateral hip replacements but I do not think that is causing his pain.  He is not experiencing groin or anterior thigh discomfort.  I would like Charles [name] to evaluate him for an epidural steroid injection and monitor his response.  In addition he has what appears to be a hernia of the peroneal fascia the distal half of the right leg.  He does have some burning and he probably has some entrapment of the sensory nerve.  I would like to obtain an MRI scan of that area.  He might be a candidate for peroneal compartment release  Follow-Up Instructions: Return After MRI scan right leg.   Orders:  Orders Placed This Encounter  Procedures  . MR TIBIA FIBULA RIGHT WO CONTRAST  . Ambulatory referral to Physical Medicine Rehab   No orders of the defined types were placed in this encounter.     Procedures: No procedures performed   Clinical Data: No additional findings.   Subjective: Chief Complaint  Patient presents with  . Lower  Back - Follow-up    MRI review  Patient presents for follow up on his lower back. He had an MRI on 08/27/2019 and is here today for those results. Patient states that there have been no changes since his last visit. He is taking oxycodone for pain.   HPI  Review of Systems  Constitutional: Negative for fatigue.  HENT: Negative for ear pain.   Eyes: Negative for pain.  Respiratory: Negative for shortness of breath.   Cardiovascular: Negative for leg swelling.  Gastrointestinal: Negative for constipation and diarrhea.  Endocrine: Negative for cold intolerance and heat intolerance.  Genitourinary: Negative for difficulty urinating.  Musculoskeletal: Negative for joint swelling.  Skin: Negative for rash.  Allergic/Immunologic: Negative for food allergies.  Neurological: Positive for weakness.  Hematological: Does not bruise/bleed easily.  Psychiatric/Behavioral: Positive for sleep disturbance.     Objective: Vital Signs: Ht 6' (1.829 m)   Wt 200 lb (90.7 kg)   BMI 27.12 kg/m   Physical Exam Constitutional:      Appearance: He is well-developed.  Eyes:     Pupils: Pupils are equal, round, and reactive to light.  Pulmonary:     Effort: Pulmonary effort is normal.  Skin:    General: Skin is warm and  dry.  Neurological:     Mental Status: He is alert and oriented to person, place, and time.  Psychiatric:        Behavior: Behavior normal.     Ortho Exam awake alert and oriented x3.  Comfortable sitting.  Painless range of motion of both hips in the sitting position with internal and external rotation.  Straight leg raise negative.  Motor exam appears to be intact.  There is a fascial hernia in the peroneal compartment the right lower leg with some areas of "burning".  He does have a Tinel's over the sensory nerve branch of the peroneal nerve with referred discomfort to the dorsum and lateral aspect of his foot  Specialty Comments:  No specialty comments  available.  Imaging: No results found.   PMFS History: Patient Active Problem List   Diagnosis Date Noted  . Pain in right leg 08/31/2019  . Grade I hemorrhoids 03/03/2019  . GERD (gastroesophageal reflux disease) 05/25/2018  . Dysphagia 05/25/2018  . Acute left-sided low back pain with left-sided sciatica 02/01/2018  . Pain in left hip 02/01/2018  . Constipation 05/11/2017  . Low back pain 04/05/2017  . 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 Bilateral sacroiliitis ([place]) 05/22/2016  . Psoriatic arthritis ([place]) 05/22/2016  . High risk medications (not anticoagulants) long-term use 05/22/2016  . DJD (degenerative joint disease), cervical 05/22/2016  . Osteoarthritis of spine with radiculopathy, lumbar region 05/22/2016  . Cephalalgia 03/16/2016  . Chronic daily headache 03/16/2016  . Neck pain 03/16/2016  . Hemorrhoids 03/13/2016  . 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 Insomnia 12/26/2015  . Abdominal pain, epigastric 11/13/2015  . Dark stools 11/13/2015  . Rectal bleeding 11/13/2015  . Numbness 09/26/2015  . 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 Avascular necrosis of left femoral head ([place]) 07/30/2015  . Avascular necrosis of right femoral head ([place]) 07/30/2015  . Psoriasis 07/30/2015  . Chronic obstructive pulmonary disease (COPD) ([place]) 07/30/2015  . 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   Past Medical History:  Diagnosis Date  . Arthritis    also has avascular necrosis   . Chronic back pain   . Chronic neck pain   . Chronic shoulder pain   . COPD (chronic obstructive pulmonary disease) ([place])   . GERD (gastroesophageal reflux disease)    tums  OTC  medication  . Headache   . HTN (hypertension)   . Hypothyroidism   . Psoriatic arthritis ([place])   . Sleep apnea    tested more than 5 yrs ago...negative results    Family History  Problem Relation Age of Onset  .  Heart failure Mother   . Diabetes Mother   . Hypertension Mother   . Heart failure Father   . Prostate cancer Father   . Arthritis Father   . Hypertension Sister   . Heart attack Sister   . Hypercholesterolemia Sister   . Healthy Daughter   . Colon cancer Neg Hx   . Gastric cancer Neg Hx   . Esophageal cancer Neg Hx     Past Surgical History:  Procedure Laterality Date  . COLONOSCOPY WITH PROPOFOL N/A 12/09/2015   four 5 mm polyps in sigmoid colon and descending colon. Pathology revealed tubular adenomas. Recommended repeat in 5 years.  . ESOPHAGOGASTRODUODENOSCOPY (EGD) WITH PROPOFOL N/A 12/09/2015   Procedure: ESOPHAGOGASTRODUODENOSCOPY (EGD) WITH PROPOFOL;  Surgeon: Daneil Dolin, MD;  [facility];  Service: Endoscopy;  Laterality: N/A;  . ESOPHAGOGASTRODUODENOSCOPY (EGD) WITH PROPOFOL N/A 06/09/2018   mild erosive reflux esophagitis, non-critical Schatzki's ring s/p dilation  . JOINT REPLACEMENT     left hip 2017  . MALONEY DILATION N/A 06/09/2018   Procedure: Venia Minks DILATION;  Surgeon: Daneil Dolin, MD;  [facility];  Service: Endoscopy;  Laterality: N/A;  . NECK SURGERY    . SHOULDER SURGERY    . TOTAL HIP ARTHROPLASTY Left 07/30/2015   Procedure: TOTAL HIP ARTHROPLASTY;  Surgeon: Garald Balding, MD;  Location: [facility];  Service: Orthopedics;  Laterality: Left;  . TOTAL HIP ARTHROPLASTY Right 09/22/2016  . TOTAL HIP ARTHROPLASTY Right 09/22/2016   Procedure: RIGHT TOTAL HIP ARTHROPLASTY;  Surgeon: Garald Balding, MD;  Location: [facility];  Service: Orthopedics;  Laterality: Right;   Social History   Occupational History  . Not on file  Tobacco Use  . Smoking status: Current Every Day Smoker    Packs/day: 0.50    Years: 25.00    Pack years: 12.50    Types: Cigarettes  . Smokeless tobacco: Never Used  Vaping Use  . Vaping Use: Former  Substance and Sexual Activity  . Alcohol use: Yes    Alcohol/week: 16.0 standard drinks    Types: 16 Cans of  beer per week  . Drug use: No    Comment: states quit  . Sexual activity: Not on file

## 2019-09-20 ENCOUNTER — Ambulatory Visit (INDEPENDENT_AMBULATORY_CARE_PROVIDER_SITE_OTHER): Payer: Managed Care, Other (non HMO) | Admitting: Physical Medicine and Rehabilitation

## 2019-09-20 ENCOUNTER — Encounter: Payer: Self-pay | Admitting: Physical Medicine and Rehabilitation

## 2019-09-20 ENCOUNTER — Ambulatory Visit: Payer: Self-pay

## 2019-09-20 ENCOUNTER — Other Ambulatory Visit: Payer: Self-pay

## 2019-09-20 VITALS — BP 137/90 | HR 80

## 2019-09-20 DIAGNOSIS — M5416 Radiculopathy, lumbar region: Secondary | ICD-10-CM | POA: Diagnosis not present

## 2019-09-20 MED ORDER — METHYLPREDNISOLONE ACETATE 80 MG/ML IJ SUSP
80.0000 mg | Freq: Once | INTRAMUSCULAR | Status: AC
  Administered 2019-09-20: 80 mg

## 2019-09-20 NOTE — Progress Notes (Signed)
Pt state lower back pain. Pt state standing makes the pain worse. Pt state pain meds, icy and heat ease the pain a little.  Numeric Pain Rating Scale and Functional Assessment Average Pain 8   In the last MONTH (on 0-10 scale) has pain interfered with the following?  1. General activity like being  able to carry out your everyday physical activities such as walking, climbing stairs, carrying groceries, or moving a chair?  Rating(7)   +Driver, -BT, -Dye Allergies.

## 2019-09-22 ENCOUNTER — Ambulatory Visit
Admission: RE | Admit: 2019-09-22 | Discharge: 2019-09-22 | Disposition: A | Discharge status: Home / Self Care | Payer: Managed Care, Other (non HMO) | Source: Ambulatory Visit | Attending: Orthopaedic Surgery | Admitting: Orthopaedic Surgery

## 2019-09-22 DIAGNOSIS — R2241 Localized swelling, mass and lump, right lower limb: Secondary | ICD-10-CM

## 2019-10-01 NOTE — Progress Notes (Signed)
Charles [name] - 49 y.o. male MRN [id number]  Date of birth: 12/18/1970  Office Visit Note: Visit Date: 09/20/2019 PCP: Celene Squibb, MD Referred by: Celene Squibb, MD  Subjective: Chief Complaint  Patient presents with  . Lower Back - Pain   HPI:  Charles [name] is a 49 y.o. male who comes in today at the request of Dr. Joni Fears for planned Right L4-L5 Lumbar epidural steroid injection with fluoroscopic guidance.  The patient has failed conservative care including home exercise, medications, time and activity modification.  This injection will be diagnostic and hopefully therapeutic.  Please see requesting physician notes for further details and justification.   MRI reviewed with images and spine model.  MRI reviewed in the note below.  Patient had 2 prior lumbar epidural injections at L4-5 by [facility] imaging in 2019 in 2020.  Also the patient in July 2020 and completed epidural injection with good relief.  Depending on relief would look at transforaminal approach versus different level.   ROS Otherwise per HPI.  Assessment & Plan: Visit Diagnoses:  1. Lumbar radiculopathy     Plan: No additional findings.   Meds & Orders:  Meds ordered this encounter  Medications  . methylPREDNISolone acetate (DEPO-MEDROL) injection 80 mg    Orders Placed This Encounter  Procedures  . XR C-ARM NO REPORT  . Epidural Steroid injection    Follow-up: Return for visit to requesting physician as needed.   Procedures: No procedures performed  Lumbar Epidural Steroid Injection - Interlaminar Approach with Fluoroscopic Guidance  Patient: Charles [name]      Date of Birth: 01-Feb-1970 MRN: [id number] PCP: Celene Squibb, MD      Visit Date: 09/20/2019   Universal Protocol:     Consent Given By: the patient  Position: PRONE  Additional Comments: Vital signs were monitored before and after the procedure. Patient was prepped and draped in the usual sterile fashion. The correct  patient, procedure, and site was verified.   Injection Procedure Details:  Procedure Site One Meds Administered:  Meds ordered this encounter  Medications  . methylPREDNISolone acetate (DEPO-MEDROL) injection 80 mg     Laterality: Right  Location/Site:  L4-L5  Needle size: 20 G  Needle type: Tuohy  Needle Placement: Paramedian epidural  Findings:   -Comments: Excellent flow of contrast into the epidural space.  Procedure Details: Using a paramedian approach from the side mentioned above, the region overlying the inferior lamina was localized under fluoroscopic visualization and the soft tissues overlying this structure were infiltrated with 4 ml. of 1% Lidocaine without Epinephrine. The Tuohy needle was inserted into the epidural space using a paramedian approach.   The epidural space was localized using loss of resistance along with lateral and bi-planar fluoroscopic views.  After negative aspirate for air, blood, and CSF, a 2 ml. volume of Isovue-250 was injected into the epidural space and the flow of contrast was observed. Radiographs were obtained for documentation purposes.    The injectate was administered into the level noted above.   Additional Comments:  The patient tolerated the procedure well Dressing: 2 x 2 sterile gauze and Band-Aid    Post-procedure details: Patient was observed during the procedure. Post-procedure instructions were reviewed.  Patient left the clinic in stable condition.     Clinical History: MRI LUMBAR SPINE WITHOUT CONTRAST  TECHNIQUE: Multiplanar, multisequence MR imaging of the lumbar spine was performed. No intravenous contrast was administered.  COMPARISON:  05/31/2017  FINDINGS:  Segmentation:  5 lumbar type vertebrae  Alignment: Reversal of lumbar lordosis with mild L5-S1 retrolisthesis.  Vertebrae:  No fracture, evidence of discitis, or bone lesion.  Conus medullaris and cauda equina: Conus extends to the L1  level. Conus and cauda equina appear normal.  Paraspinal and other soft tissues: Negative  Disc levels:  T12- L1: Unremarkable.  L1-L2: Unremarkable.  L2-L3: Disc narrowing and ventral spondylitic spurring. No impingement  L3-L4: Disc narrowing and mild bulging with ventral spondylitic spurring. No impingement  L4-L5: Disc narrowing and ventral spondylitic spurring. Right preferential far-lateral disc bulging or protrusion which was highlighted on prior. No neural compression.  L5-S1:Disc narrowing and bulging with moderate bilateral foraminal narrowing based on sagittal slices, slightly greater on the right. Minor facet spurring. Widely patent spinal canal.  IMPRESSION: 1. 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 2. Moderate bilateral foraminal narrowing at L5-S1. 3. Stable compared to 2019.   Electronically Signed   By: Monte Fantasia M.D.   On: 08/27/2019 13:07     Objective:  VS:  HT:    WT:   BMI:     BP:137/90  HR:80bpm  TEMP: ( )  RESP:  Physical Exam Constitutional:      General: He is not in acute distress.    Appearance: Normal appearance. He is not ill-appearing.  HENT:     Head: Normocephalic and atraumatic.     Right Ear: External ear normal.     Left Ear: External ear normal.  Eyes:     Extraocular Movements: Extraocular movements intact.  Cardiovascular:     Rate and Rhythm: Normal rate.     Pulses: Normal pulses.  Abdominal:     General: There is no distension.     Palpations: Abdomen is soft.  Musculoskeletal:        General: No tenderness or signs of injury.     Right lower leg: No edema.     Left lower leg: No edema.     Comments: Patient has good distal strength without clonus.  Skin:    Findings: No erythema or rash.  Neurological:     General: No focal deficit present.     Mental Status: He is alert and oriented to person, place, and time.     Sensory: No sensory deficit.     Motor: No  weakness or abnormal muscle tone.     Coordination: Coordination normal.  Psychiatric:        Mood and Affect: Mood normal.        Behavior: Behavior normal.      Imaging: No results found.

## 2019-10-01 NOTE — Procedures (Signed)
Lumbar Epidural Steroid Injection - Interlaminar Approach with Fluoroscopic Guidance  Patient: Charles [name]      Date of Birth: 05-Dec-1970 MRN: [id number] PCP: Celene Squibb, MD      Visit Date: 09/20/2019   Universal Protocol:     Consent Given By: the patient  Position: PRONE  Additional Comments: Vital signs were monitored before and after the procedure. Patient was prepped and draped in the usual sterile fashion. The correct patient, procedure, and site was verified.   Injection Procedure Details:  Procedure Site One Meds Administered:  Meds ordered this encounter  Medications  . methylPREDNISolone acetate (DEPO-MEDROL) injection 80 mg     Laterality: Right  Location/Site:  L4-L5  Needle size: 20 G  Needle type: Tuohy  Needle Placement: Paramedian epidural  Findings:   -Comments: Excellent flow of contrast into the epidural space.  Procedure Details: Using a paramedian approach from the side mentioned above, the region overlying the inferior lamina was localized under fluoroscopic visualization and the soft tissues overlying this structure were infiltrated with 4 ml. of 1% Lidocaine without Epinephrine. The Tuohy needle was inserted into the epidural space using a paramedian approach.   The epidural space was localized using loss of resistance along with lateral and bi-planar fluoroscopic views.  After negative aspirate for air, blood, and CSF, a 2 ml. volume of Isovue-250 was injected into the epidural space and the flow of contrast was observed. Radiographs were obtained for documentation purposes.    The injectate was administered into the level noted above.   Additional Comments:  The patient tolerated the procedure well Dressing: 2 x 2 sterile gauze and Band-Aid    Post-procedure details: Patient was observed during the procedure. Post-procedure instructions were reviewed.  Patient left the clinic in stable condition.

## 2019-10-03 ENCOUNTER — Other Ambulatory Visit: Payer: Self-pay

## 2019-10-03 ENCOUNTER — Encounter: Payer: Self-pay | Admitting: Orthopaedic Surgery

## 2019-10-03 ENCOUNTER — Ambulatory Visit (INDEPENDENT_AMBULATORY_CARE_PROVIDER_SITE_OTHER): Payer: Managed Care, Other (non HMO) | Admitting: Orthopaedic Surgery

## 2019-10-03 VITALS — Ht 72.0 in | Wt 200.0 lb

## 2019-10-03 DIAGNOSIS — M79604 Pain in right leg: Secondary | ICD-10-CM | POA: Diagnosis not present

## 2019-10-03 NOTE — Progress Notes (Signed)
Office Visit Note   Patient: Charles [name]           Date of Birth: [birth year]           MRN: [id number] Visit Date: 10/03/2019              Requested by: Celene Squibb, MD [place],  [place] [id number] PCP: Celene Squibb, MD   Assessment & Plan: Visit Diagnoses:  1. Pain in right leg     Plan: MRI scan of his right leg demonstrated a small muscle hernia in the lateral compartment.  He does have entrapment of the sensory branches of the peroneal nerve.  Does have some tingling in that area.  He notes that the pain "comes and goes".  Long discussion regarding the findings by MRI scan and treatment options including fasciotomy to decompress the nerve.  He would like to "consider it".  Neurologically intact  Follow-Up Instructions: Return if symptoms worsen or fail to improve.   Orders:  No orders of the defined types were placed in this encounter.  No orders of the defined types were placed in this encounter.     Procedures: No procedures performed   Clinical Data: No additional findings.   Subjective: Chief Complaint  Patient presents with  . Right Leg - Follow-up    MRI review  Patient presents today for follow up on his right leg. He had an MRI of his right lower leg to evaluate for a mass. The MRI was performed on 09/22/2019 and is here today to review those results. No changes since his last visit.   HPI  Review of Systems   Objective: Vital Signs: Ht 6' (1.829 m)   Wt 200 lb (90.7 kg)   BMI 27.12 kg/m   Physical Exam Constitutional:      Appearance: He is well-developed.  Eyes:     Pupils: Pupils are equal, round, and reactive to light.  Pulmonary:     Effort: Pulmonary effort is normal.  Skin:    General: Skin is warm and dry.  Neurological:     Mental Status: He is alert and oriented to person, place, and time.  Psychiatric:        Behavior: Behavior normal.     Ortho Exam right leg has a about it inch back to in the lateral  compartment fascia.  He does have a Tinel's of the peroneal nerve in that area for discomfort into the dorsum of his foot both in the lateral aspect of the foot and even in the deep branch between the first and second toes.  Motor exam intact.  He has good sensation. Specialty Comments:  No specialty comments available.  Imaging: No results found.   PMFS History: Patient Active Problem List   Diagnosis Date Noted  . Pain in right leg 08/31/2019  . Grade I hemorrhoids 03/03/2019  . GERD (gastroesophageal reflux disease) 05/25/2018  . Dysphagia 05/25/2018  . Acute left-sided low back pain with left-sided sciatica 02/01/2018  . Pain in left hip 02/01/2018  . Constipation 05/11/2017  . Low back pain 04/05/2017  . 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 Bilateral sacroiliitis ([facility]) 05/22/2016  . Psoriatic arthritis ([facility]) 05/22/2016  . High risk medications (not anticoagulants) long-term use 05/22/2016  . DJD (degenerative joint disease), cervical 05/22/2016  . Osteoarthritis of spine with radiculopathy, lumbar region 05/22/2016  . Cephalalgia 03/16/2016  . Chronic daily headache 03/16/2016  .  Neck pain 03/16/2016  . Hemorrhoids 03/13/2016  . 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 Insomnia 12/26/2015  . Abdominal pain, epigastric 11/13/2015  . Dark stools 11/13/2015  . Rectal bleeding 11/13/2015  . Numbness 09/26/2015  . 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 Avascular necrosis of left femoral head ([facility]) 07/30/2015  . Avascular necrosis of right femoral head ([facility]) 07/30/2015  . Psoriasis 07/30/2015  . Chronic obstructive pulmonary disease (COPD) ([facility]) 07/30/2015  . 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   Past Medical History:  Diagnosis Date  . Arthritis    also has avascular necrosis   . Chronic back pain   . Chronic neck pain   . Chronic shoulder pain   . COPD (chronic  obstructive pulmonary disease) ([facility])   . GERD (gastroesophageal reflux disease)    tums  OTC  medication  . Headache   . HTN (hypertension)   . Hypothyroidism   . Psoriatic arthritis ([facility])   . Sleep apnea    tested more than 5 yrs ago...negative results    Family History  Problem Relation Age of Onset  . Heart failure Mother   . Diabetes Mother   . Hypertension Mother   . Heart failure Father   . Prostate cancer Father   . Arthritis Father   . Hypertension Sister   . Heart attack Sister   . Hypercholesterolemia Sister   . Healthy Daughter   . Colon cancer Neg Hx   . Gastric cancer Neg Hx   . Esophageal cancer Neg Hx     Past Surgical History:  Procedure Laterality Date  . COLONOSCOPY WITH PROPOFOL N/A 12/09/2015   four 5 mm polyps in sigmoid colon and descending colon. Pathology revealed tubular adenomas. Recommended repeat in 5 years.  . ESOPHAGOGASTRODUODENOSCOPY (EGD) WITH PROPOFOL N/A 12/09/2015   Procedure: ESOPHAGOGASTRODUODENOSCOPY (EGD) WITH PROPOFOL;  Surgeon: Daneil Dolin, MD;  [facility];  Service: Endoscopy;  Laterality: N/A;  . ESOPHAGOGASTRODUODENOSCOPY (EGD) WITH PROPOFOL N/A 06/09/2018   mild erosive reflux esophagitis, non-critical Schatzki's ring s/p dilation  . JOINT REPLACEMENT     left hip 2017  . MALONEY DILATION N/A 06/09/2018   Procedure: Venia Minks DILATION;  Surgeon: Daneil Dolin, MD;  [facility];  Service: Endoscopy;  Laterality: N/A;  . NECK SURGERY    . SHOULDER SURGERY    . TOTAL HIP ARTHROPLASTY Left 07/30/2015   Procedure: TOTAL HIP ARTHROPLASTY;  Surgeon: Garald Balding, MD;  Location: [facility];  Service: Orthopedics;  Laterality: Left;  . TOTAL HIP ARTHROPLASTY Right 09/22/2016  . TOTAL HIP ARTHROPLASTY Right 09/22/2016   Procedure: RIGHT TOTAL HIP ARTHROPLASTY;  Surgeon: Garald Balding, MD;  Location: [facility];  Service: Orthopedics;  Laterality: Right;   Social History   Occupational History  . Not on file    Tobacco Use  . Smoking status: Current Every Day Smoker    Packs/day: 0.50    Years: 25.00    Pack years: 12.50    Types: Cigarettes  . Smokeless tobacco: Never Used  Vaping Use  . Vaping Use: Former  Substance and Sexual Activity  . Alcohol use: Yes    Alcohol/week: 16.0 standard drinks    Types: 16 Cans of beer per week  . Drug use: No    Comment: states quit  . Sexual activity: Not on file

## 2019-11-21 ENCOUNTER — Ambulatory Visit (INDEPENDENT_AMBULATORY_CARE_PROVIDER_SITE_OTHER): Payer: Managed Care, Other (non HMO) | Admitting: Orthopaedic Surgery

## 2019-11-21 ENCOUNTER — Other Ambulatory Visit: Payer: Self-pay

## 2019-11-21 DIAGNOSIS — G8929 Other chronic pain: Secondary | ICD-10-CM

## 2019-11-21 DIAGNOSIS — M5441 Lumbago with sciatica, right side: Secondary | ICD-10-CM

## 2019-11-22 ENCOUNTER — Encounter: Payer: Self-pay | Admitting: Orthopaedic Surgery

## 2019-11-22 NOTE — Progress Notes (Signed)
Office Visit Note   Patient: Charles [name]           Date of Birth: 27-Mar-1970           MRN: [id number] Visit Date: 11/21/2019              Requested by: Celene Squibb, MD [place],  [place] [id number] PCP: Celene Squibb, MD   Assessment & Plan: Visit Diagnoses:  1. Chronic right-sided low back pain with right-sided sciatica     Plan: Charles [name] has been diagnosed with low back pain and referred to Dr. Ernestina Patches.  In September Dr. Ernestina Patches performed an epidural steroid injection with significant relief of his pain.  Charles [name] called to make another appointment with Dr. Ernestina Patches.  He was instructed to return to see me before he could have another injection.  After discussing this with Dr. Ernestina Patches he thought that was not necessarily and according we made an appointment for data him to be reevaluated by Dr. Ernestina Patches for another injection.  No charge for today's visit  Follow-Up Instructions: Return if symptoms worsen or fail to improve.   Orders:  No orders of the defined types were placed in this encounter.  No orders of the defined types were placed in this encounter.     Procedures: No procedures performed   Clinical Data: No additional findings.   Subjective: No chief complaint on file. Recurrent low back pain with right-sided buttock and leg pain.  No related bowel or bladder dysfunction.  Pain is very similar to the discomfort he was experiencing prior to his epidural steroid injection by Dr. Ernestina Patches in September.  No injury or trauma  HPI  Review of Systems   Objective: Vital Signs: There were no vitals taken for this visit.  Physical Exam  Ortho Exam awake alert and oriented x3.  He was uncomfortable with ambulating.  No pain with range of motion of either hip.  Has had bilateral hip replacements.  Straight leg raise was minimally positive at 90 degrees on the right negative on the left.  Motor and sensory exam appear to be intact  Specialty Comments:  No specialty  comments available.  Imaging: No results found.   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   Bilateral sacroiliitis ([facility]) 05/22/2016   Psoriatic arthritis ([facility]) 05/22/2016   High risk medications (not anticoagulants) long-term use 05/22/2016   DJD (degenerative joint disease), cervical 05/22/2016   Osteoarthritis of spine with radiculopathy, lumbar region 05/22/2016   Cephalalgia 03/16/2016   Chronic daily headache 03/16/2016   Neck pain 03/16/2016   Hemorrhoids 03/13/2016   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   Insomnia 12/26/2015   Abdominal pain, epigastric 11/13/2015   Dark stools 11/13/2015   Rectal bleeding 11/13/2015   Numbness 09/26/2015   Neuropathic pain involving left lateral femoral cutaneous nerve 09/26/2015   Avascular necrosis of left femoral head ([facility]) 07/30/2015   Avascular necrosis of right femoral head ([facility]) 07/30/2015   Psoriasis 07/30/2015   Chronic obstructive pulmonary disease (COPD) ([facility]) 07/30/2015   Status post total replacement  of left hip 07/30/2015   Past Medical History:  Diagnosis Date   Arthritis    also has avascular necrosis    Chronic back pain    Chronic neck pain    Chronic shoulder pain    COPD (chronic obstructive pulmonary disease) ([facility])    GERD (gastroesophageal reflux disease)    tums  OTC  medication   Headache    HTN (hypertension)    Hypothyroidism    Psoriatic arthritis ([facility])    Sleep apnea    tested more than 5 yrs ago...negative results    Family History  Problem Relation Age of  Onset   Heart failure Mother    Diabetes Mother    Hypertension Mother    Heart failure Father    Prostate cancer Father    Arthritis Father    Hypertension Sister    Heart attack Sister    Hypercholesterolemia Sister    Healthy Daughter    Colon cancer Neg Hx    Gastric cancer Neg Hx    Esophageal cancer Neg Hx     Past Surgical History:  Procedure Laterality Date   COLONOSCOPY WITH PROPOFOL N/A 12/09/2015   four 5 mm polyps in sigmoid colon and descending colon. Pathology revealed tubular adenomas. Recommended repeat in 5 years.   ESOPHAGOGASTRODUODENOSCOPY (EGD) WITH PROPOFOL N/A 12/09/2015   Procedure: ESOPHAGOGASTRODUODENOSCOPY (EGD) WITH PROPOFOL;  Surgeon: Daneil Dolin, MD;  [facility];  Service: Endoscopy;  Laterality: N/A;   ESOPHAGOGASTRODUODENOSCOPY (EGD) WITH PROPOFOL N/A 06/09/2018   mild erosive reflux esophagitis, non-critical Schatzki's ring s/p dilation   JOINT REPLACEMENT     left hip 2017   MALONEY DILATION N/A 06/09/2018   Procedure: MALONEY DILATION;  Surgeon: Daneil Dolin, MD;  [facility];  Service: Endoscopy;  Laterality: N/A;   NECK SURGERY     SHOULDER SURGERY     TOTAL HIP ARTHROPLASTY Left 07/30/2015   Procedure: TOTAL HIP ARTHROPLASTY;  Surgeon: Garald Balding, MD;  Location: [facility];  Service: Orthopedics;  Laterality: Left;   TOTAL HIP ARTHROPLASTY Right 09/22/2016   TOTAL HIP ARTHROPLASTY Right 09/22/2016   Procedure: RIGHT TOTAL HIP ARTHROPLASTY;  Surgeon: Garald Balding, MD;  Location: [facility];  Service: Orthopedics;  Laterality: Right;   Social History   Occupational History   Not on file  Tobacco Use   Smoking status: Current Every Day Smoker    Packs/day: 0.50    Years: 25.00    Pack years: 12.50    Types: Cigarettes   Smokeless tobacco: Never Used  Vaping Use   Vaping Use: Former  Substance and Sexual Activity   Alcohol use: Yes    Alcohol/week: 16.0 standard drinks    Types:  16 Cans of beer per week   Drug use: No    Comment: states quit   Sexual activity: Not on file     Garald Balding, MD   Note - This record has been created using [employer].  Chart creation errors have been sought, but may not always  have been located. Such creation errors do not reflect on  the standard of medical care.

## 2019-12-18 ENCOUNTER — Other Ambulatory Visit: Payer: Self-pay

## 2019-12-18 ENCOUNTER — Ambulatory Visit (INDEPENDENT_AMBULATORY_CARE_PROVIDER_SITE_OTHER): Payer: Managed Care, Other (non HMO) | Admitting: Physical Medicine and Rehabilitation

## 2019-12-18 ENCOUNTER — Encounter: Payer: Self-pay | Admitting: Physical Medicine and Rehabilitation

## 2019-12-18 ENCOUNTER — Ambulatory Visit: Payer: Self-pay

## 2019-12-18 VITALS — BP 141/89 | HR 83

## 2019-12-18 DIAGNOSIS — M5416 Radiculopathy, lumbar region: Secondary | ICD-10-CM

## 2019-12-18 MED ORDER — BETAMETHASONE SOD PHOS & ACET 6 (3-3) MG/ML IJ SUSP
12.0000 mg | Freq: Once | INTRAMUSCULAR | Status: AC
  Administered 2019-12-18: 12 mg

## 2019-12-18 NOTE — Progress Notes (Signed)
Pt state lower back pain mostly on the right side. Pt state walking and standing for a long period of time makes the pain worse. Pt state he take pain meds and use heat and ice to help ease the pain. Pt has hx of inj on 09/20/19 pt state it was great and helped about two months.  Numeric Pain Rating Scale and Functional Assessment Average Pain 7   In the last MONTH (on 0-10 scale) has pain interfered with the following?  1. General activity like being  able to carry out your everyday physical activities such as walking, climbing stairs, carrying groceries, or moving a chair?  Rating(10)   +Driver, -BT, -Dye Allergies.

## 2020-02-15 NOTE — Progress Notes (Signed)
Charles [name] - 50 y.o. male MRN [id number]  Date of birth: 1970-03-17  Office Visit Note: Visit Date: 12/18/2019 PCP: Celene Squibb, MD Referred by: Celene Squibb, MD  Subjective: Chief Complaint  Patient presents with  . Lower Back - Pain   HPI:  Charles [name] is a 50 y.o. male who comes in today for planned repeat Right L4-L5 Lumbar epidural steroid injection with fluoroscopic guidance.  The patient has failed conservative care including home exercise, medications, time and activity modification.  This injection will be diagnostic and hopefully therapeutic.  Please see requesting physician notes for further details and justification. Patient received more than 50% pain relief from prior injection.   Referring: Dr. Joni Fears    ROS Otherwise per HPI.  Assessment & Plan: Visit Diagnoses:    ICD-10-CM   1. Lumbar radiculopathy  M54.16 XR C-ARM NO REPORT    Epidural Steroid injection    betamethasone acetate-betamethasone sodium phosphate (CELESTONE) injection 12 mg    Plan: No additional findings.   Meds & Orders:  Meds ordered this encounter  Medications  . betamethasone acetate-betamethasone sodium phosphate (CELESTONE) injection 12 mg    Orders Placed This Encounter  Procedures  . XR C-ARM NO REPORT  . Epidural Steroid injection    Follow-up: Return if symptoms worsen or fail to improve.   Procedures: No procedures performed  Lumbar Epidural Steroid Injection - Interlaminar Approach with Fluoroscopic Guidance  Patient: Charles [name]      Date of Birth: April 19, 1970 MRN: [id number] PCP: Celene Squibb, MD      Visit Date: 12/18/2019   Universal Protocol:     Consent Given By: the patient  Position: PRONE  Additional Comments: Vital signs were monitored before and after the procedure. Patient was prepped and draped in the usual sterile fashion. The correct patient, procedure, and site was verified.   Injection Procedure Details:   Procedure diagnoses:  Lumbar radiculopathy [M54.16]   Meds Administered:  Meds ordered this encounter  Medications  . betamethasone acetate-betamethasone sodium phosphate (CELESTONE) injection 12 mg     Laterality: Right  Location/Site:  L4-L5  Needle: 3.5 in., 20 ga. Tuohy  Needle Placement: Paramedian epidural  Findings:   -Comments: Excellent flow of contrast into the epidural space.  Procedure Details: Using a paramedian approach from the side mentioned above, the region overlying the inferior lamina was localized under fluoroscopic visualization and the soft tissues overlying this structure were infiltrated with 4 ml. of 1% Lidocaine without Epinephrine. The Tuohy needle was inserted into the epidural space using a paramedian approach.   The epidural space was localized using loss of resistance along with counter oblique bi-planar fluoroscopic views.  After negative aspirate for air, blood, and CSF, a 2 ml. volume of Isovue-250 was injected into the epidural space and the flow of contrast was observed. Radiographs were obtained for documentation purposes.    The injectate was administered into the level noted above.   Additional Comments:  The patient tolerated the procedure well Dressing: 2 x 2 sterile gauze and Band-Aid    Post-procedure details: Patient was observed during the procedure. Post-procedure instructions were reviewed.  Patient left the clinic in stable condition.     Clinical History: MRI LUMBAR SPINE WITHOUT CONTRAST  TECHNIQUE: Multiplanar, multisequence MR imaging of the lumbar spine was performed. No intravenous contrast was administered.  COMPARISON:  05/31/2017  FINDINGS: Segmentation:  5 lumbar type vertebrae  Alignment: Reversal of lumbar lordosis with mild  L5-S1 retrolisthesis.  Vertebrae:  No fracture, evidence of discitis, or bone lesion.  Conus medullaris and cauda equina: Conus extends to the L1 level. Conus and cauda equina appear  normal.  Paraspinal and other soft tissues: Negative  Disc levels:  T12- L1: Unremarkable.  L1-L2: Unremarkable.  L2-L3: Disc narrowing and ventral spondylitic spurring. No impingement  L3-L4: Disc narrowing and mild bulging with ventral spondylitic spurring. No impingement  L4-L5: Disc narrowing and ventral spondylitic spurring. Right preferential far-lateral disc bulging or protrusion which was highlighted on prior. No neural compression.  L5-S1:Disc narrowing and bulging with moderate bilateral foraminal narrowing based on sagittal slices, slightly greater on the right. Minor facet spurring. Widely patent spinal canal.  IMPRESSION: 1. 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 2. Moderate bilateral foraminal narrowing at L5-S1. 3. Stable compared to 2019.   Electronically Signed   By: Monte Fantasia M.D.   On: 08/27/2019 13:07     Objective:  VS:  HT:    WT:   BMI:     BP:(!) 141/89  HR:83bpm  TEMP: ( )  RESP:  Physical Exam Vitals and nursing note reviewed.  Constitutional:      General: He is not in acute distress.    Appearance: Normal appearance. He is not ill-appearing.  HENT:     Head: Normocephalic and atraumatic.     Right Ear: External ear normal.     Left Ear: External ear normal.     Nose: No congestion.  Eyes:     Extraocular Movements: Extraocular movements intact.  Cardiovascular:     Rate and Rhythm: Normal rate.     Pulses: Normal pulses.  Pulmonary:     Effort: Pulmonary effort is normal. No respiratory distress.  Abdominal:     General: There is no distension.     Palpations: Abdomen is soft.  Musculoskeletal:        General: No tenderness or signs of injury.     Cervical back: Neck supple.     Right lower leg: No edema.     Left lower leg: No edema.     Comments: Patient has good distal strength without clonus.  Skin:    Findings: No erythema or rash.  Neurological:     General: No focal  deficit present.     Mental Status: He is alert and oriented to person, place, and time.     Sensory: No sensory deficit.     Motor: No weakness or abnormal muscle tone.     Coordination: Coordination normal.  Psychiatric:        Mood and Affect: Mood normal.        Behavior: Behavior normal.      Imaging: No results found.

## 2020-02-15 NOTE — Procedures (Signed)
Lumbar Epidural Steroid Injection - Interlaminar Approach with Fluoroscopic Guidance  Patient: Charles [name]      Date of Birth: Feb 29, 1970 MRN: [id number] PCP: Celene Squibb, MD      Visit Date: 12/18/2019   Universal Protocol:     Consent Given By: the patient  Position: PRONE  Additional Comments: Vital signs were monitored before and after the procedure. Patient was prepped and draped in the usual sterile fashion. The correct patient, procedure, and site was verified.   Injection Procedure Details:   Procedure diagnoses: Lumbar radiculopathy [M54.16]   Meds Administered:  Meds ordered this encounter  Medications  . betamethasone acetate-betamethasone sodium phosphate (CELESTONE) injection 12 mg     Laterality: Right  Location/Site:  L4-L5  Needle: 3.5 in., 20 ga. Tuohy  Needle Placement: Paramedian epidural  Findings:   -Comments: Excellent flow of contrast into the epidural space.  Procedure Details: Using a paramedian approach from the side mentioned above, the region overlying the inferior lamina was localized under fluoroscopic visualization and the soft tissues overlying this structure were infiltrated with 4 ml. of 1% Lidocaine without Epinephrine. The Tuohy needle was inserted into the epidural space using a paramedian approach.   The epidural space was localized using loss of resistance along with counter oblique bi-planar fluoroscopic views.  After negative aspirate for air, blood, and CSF, a 2 ml. volume of Isovue-250 was injected into the epidural space and the flow of contrast was observed. Radiographs were obtained for documentation purposes.    The injectate was administered into the level noted above.   Additional Comments:  The patient tolerated the procedure well Dressing: 2 x 2 sterile gauze and Band-Aid    Post-procedure details: Patient was observed during the procedure. Post-procedure instructions were reviewed.  Patient left the clinic  in stable condition.

## 2020-02-29 ENCOUNTER — Encounter: Payer: Self-pay | Admitting: Internal Medicine

## 2020-08-01 ENCOUNTER — Ambulatory Visit: Payer: Managed Care, Other (non HMO) | Admitting: Orthopaedic Surgery

## 2020-08-01 ENCOUNTER — Encounter: Payer: Self-pay | Admitting: Orthopaedic Surgery

## 2020-08-01 VITALS — Ht 72.0 in | Wt 200.0 lb

## 2020-08-01 DIAGNOSIS — M79604 Pain in right leg: Secondary | ICD-10-CM

## 2020-08-01 NOTE — Progress Notes (Signed)
Office Visit Note   Patient: Charles [name]           Date of Birth: 01/20/1970           MRN: [id number] Visit Date: 08/01/2020              Requested by: Celene Squibb, MD [place],  [place] [address] PCP: Celene Squibb, MD   Assessment & Plan: Visit Diagnoses:  1. Pain in right leg     Plan: Tarris has been seen at intervals for the problem referable to the pain in his right leg.  He has had a prior MRI scan in September 2021 revealing an area of bulbous change in the peroneus brevis muscle at the musculotendinous junction located along the distal third of the right lower leg concerning for small muscle herniation.  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  It is reached a point where it really interferes with his activities.  We have discussed lateral compartment decompression fasciotomy and he like to proceed.  Will refer to Dr. Marlou Sa for evaluation  Follow-Up Instructions: Return Refer to Dr. Marlou Sa for consideration of lateral compartment fasciotomy right leg.   Orders:  Orders Placed This Encounter  Procedures   Ambulatory referral to Orthopedic Surgery   No orders of the defined types were placed in this encounter.     Procedures: No procedures performed   Clinical Data: No additional findings.   Subjective: Chief Complaint  Patient presents with   Right Leg - Follow-up, Pain  Patient presents today for his right lower leg. He states that his right leg is continuing to worsen. He had an MRI and saw Dr.Whitfield in September of last year. He taking oxycodone for pain relief. He is walking with a limp.  Has seen Dr. Ernestina Patches recently for problems referable to his back he notes the injections have been helpful but still having quite a bit of trouble with his right leg with burning and numbness  HPI  Review of Systems   Objective: Vital Signs: Ht 6' (1.829 m)   Wt 200 lb (90.7 kg)   BMI 27.12 kg/m   Physical Exam Constitutional:       Appearance: He is well-developed.  Eyes:     Pupils: Pupils are equal, round, and reactive to light.  Pulmonary:     Effort: Pulmonary effort is normal.  Skin:    General: Skin is warm and dry.  Neurological:     Mental Status: He is alert and oriented to person, place, and time.  Psychiatric:        Behavior: Behavior normal.    Ortho Exam awake alert and oriented x3.  Comfortable sitting.  Right leg with a palpable hernia along the junction of the middle and distal thirds of the lateral compartment.  He does have a Tinel's over the sensory nerve branch.  Motor exam intact and specifically peroneus longus and brevis musculature  Specialty Comments:  No specialty comments available.  Imaging: No results found.   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  Bilateral sacroiliitis ([place]) 05/22/2016   Psoriatic arthritis ([place]) 05/22/2016   High risk medications (not anticoagulants) long-term use 05/22/2016   DJD (degenerative joint disease), cervical 05/22/2016   Osteoarthritis of spine with radiculopathy, lumbar region 05/22/2016   Cephalalgia 03/16/2016   Chronic daily headache 03/16/2016   Neck pain 03/16/2016   Hemorrhoids 03/13/2016   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   Insomnia 12/26/2015   Abdominal pain, epigastric 11/13/2015   Dark stools 11/13/2015   Rectal bleeding 11/13/2015   Numbness 09/26/2015   Neuropathic pain involving left lateral femoral cutaneous nerve 09/26/2015   Avascular necrosis of left femoral head ([place]) 07/30/2015   Avascular necrosis of right  femoral head ([place]) 07/30/2015   Psoriasis 07/30/2015   Chronic obstructive pulmonary disease (COPD) ([place]) 07/30/2015   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   Past Medical History:  Diagnosis Date   Arthritis    also has avascular necrosis    Chronic back pain    Chronic neck pain    Chronic shoulder pain    COPD (chronic obstructive pulmonary disease) (HCC)    GERD (gastroesophageal reflux disease)    tums  OTC  medication   Headache    HTN (hypertension)    Hypothyroidism    Psoriatic arthritis ([place])    Sleep apnea    tested more than 5 yrs ago...negative results    Family History  Problem Relation Age of Onset   Heart failure Mother    Diabetes Mother    Hypertension Mother    Heart failure Father    Prostate cancer Father    Arthritis Father    Hypertension Sister    Heart attack Sister    Hypercholesterolemia Sister    Healthy Daughter    Colon cancer Neg Hx    Gastric cancer Neg Hx    Esophageal cancer Neg Hx     Past Surgical History:  Procedure Laterality Date   COLONOSCOPY WITH PROPOFOL N/A 12/09/2015   four 5 mm polyps in sigmoid colon and descending colon. Pathology revealed tubular adenomas. Recommended repeat in 5 years.   ESOPHAGOGASTRODUODENOSCOPY (EGD) WITH PROPOFOL N/A 12/09/2015   Procedure: ESOPHAGOGASTRODUODENOSCOPY (EGD) WITH PROPOFOL;  Surgeon: Daneil Dolin, MD;  [facility];  Service: Endoscopy;  Laterality: N/A;   ESOPHAGOGASTRODUODENOSCOPY (EGD) WITH PROPOFOL N/A 06/09/2018   mild erosive reflux esophagitis, non-critical Schatzki's ring s/p dilation   JOINT REPLACEMENT     left hip 2017   MALONEY DILATION N/A 06/09/2018   Procedure: MALONEY DILATION;  Surgeon: Daneil Dolin, MD;  [facility];  Service: Endoscopy;  Laterality: N/A;   NECK SURGERY     SHOULDER SURGERY     TOTAL HIP ARTHROPLASTY Left 07/30/2015   Procedure: TOTAL HIP ARTHROPLASTY;  Surgeon: Garald Balding, MD;  Location: [facility];  Service:  Orthopedics;  Laterality: Left;   TOTAL HIP ARTHROPLASTY Right 09/22/2016   TOTAL HIP ARTHROPLASTY Right 09/22/2016   Procedure: RIGHT TOTAL HIP ARTHROPLASTY;  Surgeon: Garald Balding, MD;  Location: [facility];  Service: Orthopedics;  Laterality: Right;   Social History   Occupational History   Not on file  Tobacco Use   Smoking status: Every Day    Packs/day: 0.50    Years: 25.00    Pack years: 12.50    Types: Cigarettes   Smokeless tobacco: Never  Vaping Use   Vaping Use: Former  Substance and Sexual Activity   Alcohol use: Yes  Alcohol/week: 16.0 standard drinks    Types: 16 Cans of beer per week   Drug use: No    Comment: states quit   Sexual activity: Not on file

## 2020-08-16 ENCOUNTER — Ambulatory Visit (INDEPENDENT_AMBULATORY_CARE_PROVIDER_SITE_OTHER): Payer: Managed Care, Other (non HMO) | Admitting: Orthopedic Surgery

## 2020-08-16 ENCOUNTER — Other Ambulatory Visit: Payer: Self-pay

## 2020-08-16 ENCOUNTER — Encounter: Payer: Self-pay | Admitting: Orthopedic Surgery

## 2020-08-16 ENCOUNTER — Encounter (HOSPITAL_COMMUNITY): Payer: Self-pay | Admitting: Orthopedic Surgery

## 2020-08-16 DIAGNOSIS — G5701 Lesion of sciatic nerve, right lower limb: Secondary | ICD-10-CM | POA: Diagnosis not present

## 2020-08-16 NOTE — Progress Notes (Signed)
Office Visit Note   Patient: Charles [name]           Date of Birth: November 18, 1970           MRN: [id number] Visit Date: 08/16/2020 Requested by: Garald Balding, MD [address] [place],   [id number] PCP: Celene Squibb, MD  Subjective: Chief Complaint  Patient presents with   Right Leg - Pain    HPI: Nakeem is a 50 year old patient with right leg pain of many months duration.  He denies any history of injury.  He has had pain for years but it has been worse over the past 4 months.  He is ambulating with a limp.  With activity he describes numbness and tingling and burning pain which starts in the lateral lower leg and extends to the top of his foot as well as up the leg.  He works as a Barrister's clerk.  He has had epidural steroid injections by Dr. Ernestina Patches twice this year which helps his back but not his foot.  He has had an MRI scan which shows herniation of peroneus brevis about 20 cm above the lateral malleolus.              ROS: All systems reviewed are negative as they relate to the chief complaint within the history of present illness.  Patient denies  fevers or chills.   Assessment & Plan: Visit Diagnoses:  1. 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  I think that the muscle herniation is likely compressing the nerve.  Plan is peroneal nerve superficial decompression through that herniation with lateral compartment release.  We will have to extend the compartment release from the herniation distally to its exiting point around the malleolus as well as proximally.  Risk and benefits are discussed including not limited to infection nerve vessel damage incomplete pain relief.  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  Patient  understands risk and benefits.  All questions answered.  Also discussed how the contour of his leg may appear different than the right compared to the left.  Also he may have some moderate amount of weakness right versus left just until the muscle tendon unit junction resets and he can resume normal activity.  Follow-Up Instructions: No follow-ups on file.   Orders:  No orders of the defined types were placed in this encounter.  No orders of the defined types were placed in this encounter.     Procedures: No procedures performed   Clinical Data: No additional findings.  Objective: Vital Signs: There were no vitals taken for this visit.  Physical Exam:   Constitutional: Patient appears well-developed HEENT:  Head: Normocephalic Eyes:EOM are normal Neck: Normal range of motion Cardiovascular: Normal rate Pulmonary/chest: Effort normal Neurologic: Patient is alert Skin: Skin is warm Psychiatric: Patient has normal mood and affect   Ortho Exam: Ortho exam demonstrates no nerve root tension signs on the right left-hand side.  Is pretty reasonable ankle dorsiflexion plantarflexion strength.  Hernia is present in the midportion of the right lower extremity laterally about 20 cm proximal to the tip of the fibula.  Does have paresthesias on the dorsal aspect of the foot.  Tinel's is positive at this location.  No discrete masses  present.  No tenderness around the fibular head.  Calf circumference is the same right versus left at mid level.  Eversion strength symmetric bilaterally at 5 out of 5.  Specialty Comments:  No specialty comments available.  Imaging: No results found.   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   Bilateral sacroiliitis ([place]) 05/22/2016   Psoriatic arthritis ([place]) 05/22/2016   High risk medications (not anticoagulants) long-term use 05/22/2016   DJD (degenerative joint disease), cervical 05/22/2016   Osteoarthritis of spine with radiculopathy, lumbar region 05/22/2016   Cephalalgia 03/16/2016   Chronic daily headache 03/16/2016   Neck pain 03/16/2016   Hemorrhoids 03/13/2016   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   Insomnia 12/26/2015   Abdominal pain, epigastric 11/13/2015   Dark stools 11/13/2015   Rectal bleeding 11/13/2015   Numbness 09/26/2015   Neuropathic pain involving left lateral femoral cutaneous nerve 09/26/2015   Avascular necrosis of left femoral head ([place]) 07/30/2015   Avascular necrosis of right femoral head ([place]) 07/30/2015   Psoriasis 07/30/2015   Chronic obstructive pulmonary disease (COPD) ([place]) 07/30/2015   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)    Sleep apnea    tested more than 5 yrs ago...negative results    Family History  Problem Relation Age of Onset   Heart failure Mother    Diabetes Mother    Hypertension Mother    Heart failure Father    Prostate cancer Father    Arthritis Father    Hypertension Sister    Heart attack Sister    Hypercholesterolemia Sister    Healthy Daughter    Colon cancer Neg Hx    Gastric cancer Neg Hx    Esophageal cancer Neg Hx     Past Surgical History:  Procedure Laterality Date    COLONOSCOPY WITH PROPOFOL N/A 12/09/2015   four 5 mm polyps in sigmoid colon and descending colon. Pathology revealed tubular adenomas. Recommended repeat in 5 years.   ESOPHAGOGASTRODUODENOSCOPY (EGD) WITH PROPOFOL N/A 12/09/2015   Procedure: ESOPHAGOGASTRODUODENOSCOPY (EGD) WITH PROPOFOL;  Surgeon: Daneil Dolin, MD;  [facility];  Service: Endoscopy;  Laterality: N/A;   ESOPHAGOGASTRODUODENOSCOPY (EGD) WITH PROPOFOL N/A 06/09/2018   mild erosive reflux esophagitis, non-critical Schatzki's ring s/p dilation   JOINT REPLACEMENT     left hip 2017   MALONEY DILATION N/A 06/09/2018   Procedure: MALONEY DILATION;  Surgeon: Daneil Dolin, MD;  [facility];  Service: Endoscopy;  Laterality: N/A;   NECK SURGERY     SHOULDER SURGERY     TOTAL HIP ARTHROPLASTY Left 07/30/2015   Procedure: TOTAL HIP ARTHROPLASTY;  Surgeon: Garald Balding, MD;  Location: [facility];  Service: Orthopedics;  Laterality: Left;   TOTAL HIP ARTHROPLASTY Right 09/22/2016   TOTAL HIP ARTHROPLASTY Right 09/22/2016   Procedure: RIGHT TOTAL HIP ARTHROPLASTY;  Surgeon: Garald Balding, MD;  Location: [facility];  Service: Orthopedics;  Laterality: Right;   Social History   Occupational History   Not on file  Tobacco Use   Smoking status: Every Day    Packs/day: 0.50    Years: 25.00    Pack years: 12.50    Types: Cigarettes   Smokeless tobacco: Never  Vaping Use   Vaping Use: Former  Substance and Sexual Activity   Alcohol use: Yes    Alcohol/week: 16.0 standard drinks    Types: 16 Cans of beer per week   Drug use: No    Comment: states quit   Sexual activity: Not on file

## 2020-08-16 NOTE — Progress Notes (Addendum)
Charles [name] denies chest pain or shortness of breath. Patient denies having any s/s of Covid in his household.  Patient denies any known exposure to Covid.   Charles [name] has a history of HTN- it has been normal and he is not on medications. Patient was test for sleep apnea more that 7 years ago,patient did not have a CPAP - Stop Bang Assessment score was .  Hygiene instructions were given.

## 2020-08-19 ENCOUNTER — Ambulatory Visit (HOSPITAL_COMMUNITY): Payer: Managed Care, Other (non HMO) | Admitting: Anesthesiology

## 2020-08-19 ENCOUNTER — Encounter (HOSPITAL_COMMUNITY): Payer: Self-pay | Admitting: Orthopedic Surgery

## 2020-08-19 ENCOUNTER — Encounter (HOSPITAL_COMMUNITY)
Admission: RE | Disposition: A | Discharge status: Home / Self Care | Payer: Self-pay | Source: Home / Self Care | Attending: Orthopedic Surgery

## 2020-08-19 ENCOUNTER — Other Ambulatory Visit: Payer: Self-pay

## 2020-08-19 ENCOUNTER — Ambulatory Visit (HOSPITAL_COMMUNITY)
Admission: RE | Admit: 2020-08-19 | Discharge: 2020-08-19 | Disposition: A | Discharge status: Home / Self Care | Payer: Managed Care, Other (non HMO) | Attending: Orthopedic Surgery | Admitting: Orthopedic Surgery

## 2020-08-19 DIAGNOSIS — G5701 Lesion of sciatic nerve, right lower limb: Secondary | ICD-10-CM | POA: Diagnosis not present

## 2020-08-19 DIAGNOSIS — Z86010 Personal history of colonic polyps: Secondary | ICD-10-CM | POA: Diagnosis not present

## 2020-08-19 DIAGNOSIS — Z96643 Presence of artificial hip joint, bilateral: Secondary | ICD-10-CM | POA: Insufficient documentation

## 2020-08-19 DIAGNOSIS — Z8679 Personal history of other diseases of the circulatory system: Secondary | ICD-10-CM | POA: Insufficient documentation

## 2020-08-19 DIAGNOSIS — J449 Chronic obstructive pulmonary disease, unspecified: Secondary | ICD-10-CM | POA: Diagnosis not present

## 2020-08-19 DIAGNOSIS — F17200 Nicotine dependence, unspecified, uncomplicated: Secondary | ICD-10-CM | POA: Insufficient documentation

## 2020-08-19 DIAGNOSIS — Z8249 Family history of ischemic heart disease and other diseases of the circulatory system: Secondary | ICD-10-CM | POA: Diagnosis not present

## 2020-08-19 DIAGNOSIS — E039 Hypothyroidism, unspecified: Secondary | ICD-10-CM | POA: Diagnosis not present

## 2020-08-19 DIAGNOSIS — G4730 Sleep apnea, unspecified: Secondary | ICD-10-CM | POA: Diagnosis not present

## 2020-08-19 DIAGNOSIS — Z79890 Hormone replacement therapy (postmenopausal): Secondary | ICD-10-CM | POA: Diagnosis not present

## 2020-08-19 DIAGNOSIS — G5782 Other specified mononeuropathies of left lower limb: Secondary | ICD-10-CM | POA: Insufficient documentation

## 2020-08-19 HISTORY — PX: SUPERFICIAL PERONEAL NERVE RELEASE: SHX6200

## 2020-08-19 LAB — CBC
HCT: 46.3 % (ref 39.0–52.0)
Hemoglobin: 16 g/dL (ref 13.0–17.0)
MCH: 33.7 pg (ref 26.0–34.0)
MCHC: 34.6 g/dL (ref 30.0–36.0)
MCV: 97.5 fL (ref 80.0–100.0)
Platelets: 181 10*3/uL (ref 150–400)
RBC: 4.75 MIL/uL (ref 4.22–5.81)
RDW: 12.4 % (ref 11.5–15.5)
WBC: 6.6 10*3/uL (ref 4.0–10.5)
nRBC: 0 % (ref 0.0–0.2)

## 2020-08-19 LAB — BASIC METABOLIC PANEL
Anion gap: 9 (ref 5–15)
BUN: 17 mg/dL (ref 6–20)
CO2: 24 mmol/L (ref 22–32)
Calcium: 9.2 mg/dL (ref 8.9–10.3)
Chloride: 104 mmol/L (ref 98–111)
Creatinine, Ser: 1.01 mg/dL (ref 0.61–1.24)
GFR, Estimated: >60 mL/min (ref >60)
Glucose, Bld: 97 mg/dL (ref 70–99)
Potassium: 4.7 mmol/L (ref 3.5–5.1)
Sodium: 137 mmol/L (ref 135–145)

## 2020-08-19 SURGERY — DECOMPRESSION, NERVE, SUPERFICIAL PERONEAL
Anesthesia: General | Laterality: Right

## 2020-08-19 MED ORDER — MORPHINE SULFATE (PF) 4 MG/ML IV SOLN
INTRAVENOUS | Status: AC
  Filled 2020-08-19: qty 2

## 2020-08-19 MED ORDER — SUGAMMADEX SODIUM 200 MG/2ML IV SOLN
INTRAVENOUS | Status: DC | PRN
  Administered 2020-08-19: 200 mg via INTRAVENOUS

## 2020-08-19 MED ORDER — MIDAZOLAM HCL 2 MG/2ML IJ SOLN
INTRAMUSCULAR | Status: AC
  Filled 2020-08-19: qty 2

## 2020-08-19 MED ORDER — DROPERIDOL 2.5 MG/ML IJ SOLN: 0.6250 mg | Freq: Once | INTRAMUSCULAR | Status: DC | PRN

## 2020-08-19 MED ORDER — ROCURONIUM BROMIDE 10 MG/ML (PF) SYRINGE
PREFILLED_SYRINGE | INTRAVENOUS | Status: DC | PRN
  Administered 2020-08-19: 70 mg via INTRAVENOUS

## 2020-08-19 MED ORDER — 0.9 % SODIUM CHLORIDE (POUR BTL) OPTIME
TOPICAL | Status: DC | PRN
  Administered 2020-08-19: 1000 mL

## 2020-08-19 MED ORDER — METHOCARBAMOL 500 MG PO TABS: 500.0000 mg | ORAL_TABLET | Freq: Three times a day (TID) | ORAL | 0 refills | Status: DC | PRN

## 2020-08-19 MED ORDER — ASPIRIN 81 MG PO CHEW: 81.0000 mg | CHEWABLE_TABLET | Freq: Every day | ORAL | 0 refills | Status: DC

## 2020-08-19 MED ORDER — FENTANYL CITRATE (PF) 100 MCG/2ML IJ SOLN
25.0000 ug | INTRAMUSCULAR | Status: DC | PRN
  Administered 2020-08-19 (×2): 50 ug via INTRAVENOUS

## 2020-08-19 MED ORDER — POVIDONE-IODINE 7.5 % EX SOLN: Freq: Once | CUTANEOUS | Status: DC

## 2020-08-19 MED ORDER — PROPOFOL 10 MG/ML IV BOLUS
INTRAVENOUS | Status: DC | PRN
  Administered 2020-08-19: 200 mg via INTRAVENOUS

## 2020-08-19 MED ORDER — ACETAMINOPHEN 500 MG PO TABS
1000.0000 mg | ORAL_TABLET | Freq: Once | ORAL | Status: AC
  Administered 2020-08-19: 1000 mg via ORAL
  Filled 2020-08-19: qty 2

## 2020-08-19 MED ORDER — OXYCODONE HCL 5 MG PO TABS
ORAL_TABLET | ORAL | Status: AC
  Filled 2020-08-19: qty 1

## 2020-08-19 MED ORDER — POVIDONE-IODINE 10 % EX SWAB
2.0000 "application " | Freq: Once | CUTANEOUS | Status: AC
  Administered 2020-08-19: 2 via TOPICAL

## 2020-08-19 MED ORDER — OXYCODONE HCL 5 MG PO TABS
5.0000 mg | ORAL_TABLET | Freq: Once | ORAL | Status: AC | PRN
Start: 2020-08-19 — End: 2020-08-19
  Administered 2020-08-19: 5 mg via ORAL

## 2020-08-19 MED ORDER — DEXAMETHASONE SODIUM PHOSPHATE 10 MG/ML IJ SOLN
INTRAMUSCULAR | Status: AC
  Filled 2020-08-19: qty 1

## 2020-08-19 MED ORDER — MIDAZOLAM HCL 2 MG/2ML IJ SOLN
INTRAMUSCULAR | Status: DC | PRN
  Administered 2020-08-19: 2 mg via INTRAVENOUS

## 2020-08-19 MED ORDER — BUPIVACAINE HCL (PF) 0.25 % IJ SOLN
INTRAMUSCULAR | Status: AC
  Filled 2020-08-19: qty 30

## 2020-08-19 MED ORDER — ONDANSETRON HCL 4 MG/2ML IJ SOLN
INTRAMUSCULAR | Status: DC | PRN
  Administered 2020-08-19: 4 mg via INTRAVENOUS

## 2020-08-19 MED ORDER — CHLORHEXIDINE GLUCONATE 0.12 % MT SOLN
15.0000 mL | Freq: Once | OROMUCOSAL | Status: AC
  Administered 2020-08-19: 15 mL via OROMUCOSAL
  Filled 2020-08-19: qty 15

## 2020-08-19 MED ORDER — LIDOCAINE 2% (20 MG/ML) 5 ML SYRINGE
INTRAMUSCULAR | Status: DC | PRN
  Administered 2020-08-19: 80 mg via INTRAVENOUS

## 2020-08-19 MED ORDER — OXYCODONE HCL 5 MG/5ML PO SOLN
5.0000 mg | Freq: Once | ORAL | Status: AC | PRN
Start: 2020-08-19 — End: 2020-08-19

## 2020-08-19 MED ORDER — FENTANYL CITRATE (PF) 250 MCG/5ML IJ SOLN
INTRAMUSCULAR | Status: DC | PRN
  Administered 2020-08-19 (×5): 50 ug via INTRAVENOUS

## 2020-08-19 MED ORDER — BUPIVACAINE HCL (PF) 0.25 % IJ SOLN
INTRAMUSCULAR | Status: DC | PRN
  Administered 2020-08-19: 30 mL

## 2020-08-19 MED ORDER — ONDANSETRON HCL 4 MG/2ML IJ SOLN
INTRAMUSCULAR | Status: AC
  Filled 2020-08-19: qty 2

## 2020-08-19 MED ORDER — FENTANYL CITRATE (PF) 250 MCG/5ML IJ SOLN
INTRAMUSCULAR | Status: AC
  Filled 2020-08-19: qty 5

## 2020-08-19 MED ORDER — OXYCODONE-ACETAMINOPHEN 10-325 MG PO TABS: 1.0000 | ORAL_TABLET | ORAL | 0 refills | Status: AC | PRN

## 2020-08-19 MED ORDER — CLONIDINE HCL (ANALGESIA) 100 MCG/ML EP SOLN
EPIDURAL | Status: DC | PRN
  Administered 2020-08-19: 100 ug

## 2020-08-19 MED ORDER — PROMETHAZINE HCL 25 MG/ML IJ SOLN: 6.2500 mg | INTRAMUSCULAR | Status: DC | PRN

## 2020-08-19 MED ORDER — ALBUMIN HUMAN 5 % IV SOLN: INTRAVENOUS | Status: DC | PRN

## 2020-08-19 MED ORDER — LIDOCAINE 2% (20 MG/ML) 5 ML SYRINGE
INTRAMUSCULAR | Status: AC
  Filled 2020-08-19: qty 5

## 2020-08-19 MED ORDER — PROPOFOL 10 MG/ML IV BOLUS
INTRAVENOUS | Status: AC
  Filled 2020-08-19: qty 40

## 2020-08-19 MED ORDER — GABAPENTIN 300 MG PO CAPS: 300.0000 mg | ORAL_CAPSULE | Freq: Three times a day (TID) | ORAL | 0 refills | Status: DC

## 2020-08-19 MED ORDER — PHENYLEPHRINE 40 MCG/ML (10ML) SYRINGE FOR IV PUSH (FOR BLOOD PRESSURE SUPPORT)
PREFILLED_SYRINGE | INTRAVENOUS | Status: DC | PRN
  Administered 2020-08-19 (×10): 40 ug via INTRAVENOUS

## 2020-08-19 MED ORDER — LACTATED RINGERS IV SOLN: INTRAVENOUS | Status: DC

## 2020-08-19 MED ORDER — DEXAMETHASONE SODIUM PHOSPHATE 10 MG/ML IJ SOLN
INTRAMUSCULAR | Status: DC | PRN
  Administered 2020-08-19: 10 mg via INTRAVENOUS

## 2020-08-19 MED ORDER — MORPHINE SULFATE (PF) 4 MG/ML IV SOLN
INTRAVENOUS | Status: DC | PRN
  Administered 2020-08-19 (×2): 4 mg

## 2020-08-19 MED ORDER — PHENYLEPHRINE 40 MCG/ML (10ML) SYRINGE FOR IV PUSH (FOR BLOOD PRESSURE SUPPORT)
PREFILLED_SYRINGE | INTRAVENOUS | Status: AC
  Filled 2020-08-19: qty 10

## 2020-08-19 MED ORDER — FENTANYL CITRATE (PF) 100 MCG/2ML IJ SOLN
INTRAMUSCULAR | Status: AC
  Filled 2020-08-19: qty 2

## 2020-08-19 MED ORDER — ORAL CARE MOUTH RINSE: 15.0000 mL | Freq: Once | OROMUCOSAL | Status: AC

## 2020-08-19 MED ORDER — CEFAZOLIN SODIUM-DEXTROSE 2-4 GM/100ML-% IV SOLN
2.0000 g | INTRAVENOUS | Status: AC
  Administered 2020-08-19: 2 g via INTRAVENOUS
  Filled 2020-08-19: qty 100

## 2020-08-19 SURGICAL SUPPLY — 68 items
APL SKNCLS STERI-STRIP NONHPOA (GAUZE/BANDAGES/DRESSINGS)
BAG COUNTER SPONGE SURGICOUNT (BAG) ×2 IMPLANT
BAG SPNG CNTER NS LX DISP (BAG) ×1
BENZOIN TINCTURE PRP APPL 2/3 (GAUZE/BANDAGES/DRESSINGS) ×1 IMPLANT
BLADE CLIPPER SURG (BLADE) IMPLANT
BNDG CMPR 9X4 STRL LF SNTH (GAUZE/BANDAGES/DRESSINGS) ×1
BNDG COHESIVE 1X5 TAN STRL LF (GAUZE/BANDAGES/DRESSINGS) IMPLANT
BNDG CONFORM 3 STRL LF (GAUZE/BANDAGES/DRESSINGS) IMPLANT
BNDG ELASTIC 2X5.8 VLCR STR LF (GAUZE/BANDAGES/DRESSINGS) IMPLANT
BNDG ELASTIC 4X5.8 VLCR STR LF (GAUZE/BANDAGES/DRESSINGS) IMPLANT
BNDG ELASTIC 6X5.8 VLCR STR LF (GAUZE/BANDAGES/DRESSINGS) ×1 IMPLANT
BNDG ESMARK 4X9 LF (GAUZE/BANDAGES/DRESSINGS) ×1 IMPLANT
BOOT STEPPER DURA LG (SOFTGOODS) ×1 IMPLANT
CLSR STERI-STRIP ANTIMIC 1/2X4 (GAUZE/BANDAGES/DRESSINGS) ×2 IMPLANT
CORD BIPOLAR FORCEPS 12FT (ELECTRODE) ×2 IMPLANT
COVER SURGICAL LIGHT HANDLE (MISCELLANEOUS) ×2 IMPLANT
CUFF TOURN SGL QUICK 18X4 (TOURNIQUET CUFF) ×1 IMPLANT
CUFF TOURN SGL QUICK 24 (TOURNIQUET CUFF)
CUFF TRNQT CYL 24X4X16.5-23 (TOURNIQUET CUFF) IMPLANT
DRAPE INCISE IOBAN 66X45 STRL (DRAPES) ×6 IMPLANT
DRAPE OEC MINIVIEW 54X84 (DRAPES) IMPLANT
DRAPE U-SHAPE 47X51 STRL (DRAPES) IMPLANT
DRSG AQUACEL AG ADV 3.5X14 (GAUZE/BANDAGES/DRESSINGS) ×1 IMPLANT
DRSG EMULSION OIL 3X3 NADH (GAUZE/BANDAGES/DRESSINGS) IMPLANT
DURAPREP 26ML APPLICATOR (WOUND CARE) ×2 IMPLANT
ELECT REM PT RETURN 9FT ADLT (ELECTROSURGICAL) ×2
ELECTRODE REM PT RTRN 9FT ADLT (ELECTROSURGICAL) IMPLANT
GAUZE SPONGE 2X2 8PLY STRL LF (GAUZE/BANDAGES/DRESSINGS) IMPLANT
GAUZE SPONGE 4X4 12PLY STRL (GAUZE/BANDAGES/DRESSINGS) ×1 IMPLANT
GAUZE XEROFORM 1X8 LF (GAUZE/BANDAGES/DRESSINGS) IMPLANT
GLOVE SRG 8 PF TXTR STRL LF DI (GLOVE) ×1 IMPLANT
GLOVE SURG LTX SZ8 (GLOVE) ×2 IMPLANT
GLOVE SURG UNDER POLY LF SZ8 (GLOVE) ×2
GOWN STRL REUS W/ TWL LRG LVL3 (GOWN DISPOSABLE) ×2 IMPLANT
GOWN STRL REUS W/TWL LRG LVL3 (GOWN DISPOSABLE) ×6
KIT BASIN OR (CUSTOM PROCEDURE TRAY) ×2 IMPLANT
KIT TURNOVER KIT B (KITS) ×2 IMPLANT
MANIFOLD NEPTUNE II (INSTRUMENTS) ×2 IMPLANT
NS IRRIG 1000ML POUR BTL (IV SOLUTION) ×2 IMPLANT
PACK ORTHO EXTREMITY (CUSTOM PROCEDURE TRAY) ×2 IMPLANT
PAD ARMBOARD 7.5X6 YLW CONV (MISCELLANEOUS) ×4 IMPLANT
PADDING CAST COTTON 6X4 STRL (CAST SUPPLIES) ×1 IMPLANT
PENCIL BUTTON HOLSTER BLD 10FT (ELECTRODE) ×1 IMPLANT
SPECIMEN JAR SMALL (MISCELLANEOUS) ×1 IMPLANT
SPONGE GAUZE 2X2 STER 10/PKG (GAUZE/BANDAGES/DRESSINGS)
SUCTION FRAZIER HANDLE 10FR (MISCELLANEOUS)
SUCTION TUBE FRAZIER 10FR DISP (MISCELLANEOUS) IMPLANT
SUT ETHIBOND 4 0 TF (SUTURE) IMPLANT
SUT ETHIBOND 5 0 P 3 (SUTURE)
SUT ETHILON 4 0 P 3 18 (SUTURE) IMPLANT
SUT ETHILON 5 0 P 3 18 (SUTURE)
SUT MNCRL AB 3-0 PS2 27 (SUTURE) ×1 IMPLANT
SUT NYLON ETHILON 5-0 P-3 1X18 (SUTURE) IMPLANT
SUT POLY ETHIBOND 5-0 P-3 1X18 (SUTURE) IMPLANT
SUT PROLENE 4 0 P 3 18 (SUTURE) IMPLANT
SUT SILK 4 0 PS 2 (SUTURE) IMPLANT
SUT VIC AB 2-0 CT1 27 (SUTURE) ×2
SUT VIC AB 2-0 CT1 36 (SUTURE) ×1 IMPLANT
SUT VIC AB 2-0 CT1 TAPERPNT 27 (SUTURE) IMPLANT
SUT VIC AB 2-0 SH 27 (SUTURE) ×4
SUT VIC AB 2-0 SH 27XBRD (SUTURE) IMPLANT
SUT VIC AB 3-0 FS2 27 (SUTURE) ×1 IMPLANT
SUT VIC AB 3-0 SH 27 (SUTURE) ×2
SUT VIC AB 3-0 SH 27X BRD (SUTURE) IMPLANT
TOWEL GREEN STERILE (TOWEL DISPOSABLE) ×2 IMPLANT
TOWEL GREEN STERILE FF (TOWEL DISPOSABLE) ×2 IMPLANT
TUBE CONNECTING 12X1/4 (SUCTIONS) IMPLANT
WATER STERILE IRR 1000ML POUR (IV SOLUTION) ×2 IMPLANT

## 2020-08-19 NOTE — Progress Notes (Signed)
Orthopedic Tech Progress Note Patient Details:  Charles [name] 07-Oct-1970 [id number]  OR RN called requesting a LARGE CAM WALKER. Dropped off to OR desk   Ortho Devices Type of Ortho Device: CAM walker Ortho Device/Splint Interventions: Other (comment)   Post Interventions Patient Tolerated: Other (comment) Instructions Provided: Other (comment)  Janit Pagan 08/19/2020, 1:49 PM

## 2020-08-19 NOTE — Transfer of Care (Signed)
Immediate Anesthesia Transfer of Care Note  Patient: Charles [name]  Procedure(s) Performed: right leg superficial peroneal nerve decompression, lateral compartment release (Right)  Patient Location: PACU  Anesthesia Type:General  Level of Consciousness: awake, alert  and patient cooperative  Airway & Oxygen Therapy: Patient Spontanous Breathing and Patient connected to nasal cannula oxygen  Post-op Assessment: Report given to RN and Post -op Vital signs reviewed and stable  Post vital signs: Reviewed and stable  Last Vitals:  Vitals Value Taken Time  BP 145/89 08/19/20 1403  Temp    Pulse 80 08/19/20 1406  Resp 12 08/19/20 1406  SpO2 96 % 08/19/20 1406  Vitals shown include unvalidated device data.  Last Pain:  Vitals:   08/19/20 1107  TempSrc:   PainSc: 7       Patients Stated Pain Goal: 3 ([id number])  Complications: No notable events documented.

## 2020-08-19 NOTE — Anesthesia Preprocedure Evaluation (Addendum)
Anesthesia Evaluation  Patient identified by MRN, date of birth, ID band Patient awake    Reviewed: Allergy & Precautions, NPO status , Patient's Chart, lab work & pertinent test results  Airway Mallampati: II  TM Distance: >3 FB Neck ROM: Full    Dental no notable dental hx.    Pulmonary sleep apnea , COPD, Current Smoker,    Pulmonary exam normal breath sounds clear to auscultation       Cardiovascular hypertension, negative cardio ROS Normal cardiovascular exam Rhythm:Regular Rate:Normal     Neuro/Psych  Headaches,  Neuromuscular disease negative psych ROS   GI/Hepatic Neg liver ROS, GERD  ,  Endo/Other  Hypothyroidism   Renal/GU negative Renal ROS  negative genitourinary   Musculoskeletal  (+) Arthritis  (psoriatic), Chronic pain   Abdominal   Peds negative pediatric ROS (+)  Hematology negative hematology ROS (+)   Anesthesia Other Findings   Reproductive/Obstetrics negative OB ROS                            Anesthesia Physical Anesthesia Plan  ASA: 2  Anesthesia Plan: General   Post-op Pain Management:    Induction: Intravenous  PONV Risk Score and Plan: 1 and Treatment may vary due to age or medical condition, Ondansetron and Dexamethasone  Airway Management Planned: Oral ETT  Additional Equipment:   Intra-op Plan:   Post-operative Plan: Extubation in OR  Informed Consent: I have reviewed the patients History and Physical, chart, labs and discussed the procedure including the risks, benefits and alternatives for the proposed anesthesia with the patient or authorized representative who has indicated his/her understanding and acceptance.     Dental advisory given  Plan Discussed with: CRNA and Anesthesiologist  Anesthesia Plan Comments:        Anesthesia Quick Evaluation

## 2020-08-19 NOTE — Anesthesia Procedure Notes (Signed)
Procedure Name: Intubation Date/Time: 08/19/2020 11:56 AM Performed by: Michele Rockers, CRNA Pre-anesthesia Checklist: Patient identified, Patient being monitored, Timeout performed, Emergency Drugs available and Suction available Patient Re-evaluated:Patient Re-evaluated prior to induction Oxygen Delivery Method: Circle System Utilized Preoxygenation: Pre-oxygenation with 100% oxygen Induction Type: IV induction Ventilation: Mask ventilation without difficulty Laryngoscope Size: Miller and 2 Grade View: Grade I Tube type: Oral Tube size: 7.5 mm Number of attempts: 1 Airway Equipment and Method: Stylet Placement Confirmation: ETT inserted through vocal cords under direct vision, positive ETCO2 and breath sounds checked- equal and bilateral Secured at: 22 cm Tube secured with: Tape Dental Injury: Teeth and Oropharynx as per pre-operative assessment

## 2020-08-19 NOTE — Brief Op Note (Signed)
   08/19/2020  1:12 PM  PATIENT:  ARSLAN COPUS  50 y.o. male  PRE-OPERATIVE DIAGNOSIS:  right leg superficial peroneal nerve compression  POST-OPERATIVE DIAGNOSIS:  right leg superficial peroneal nerve compression  PROCEDURE:  Procedure(s): right leg superficial peroneal nerve decompression, lateral compartment release  SURGEON:  Surgeon(s): Marlou Sa, Tonna Corner, MD  ASSISTANT: magnant pa  ANESTHESIA:   general  EBL: 10 ml    Total I/O In: 100 [IV Piggyback:100] Out: -   BLOOD ADMINISTERED: none  DRAINS: none   LOCAL MEDICATIONS USED: Marcaine morphine clonidine  SPECIMEN:  No Specimen  COUNTS:  YES  TOURNIQUET:   Total Tourniquet Time Documented: Thigh (Right) - 41 minutes Total: Thigh (Right) - 41 minutes   DICTATION: .O done  PLAN OF CARE: Discharge to home after PACU  PATIENT DISPOSITION:  PACU - hemodynamically stable

## 2020-08-19 NOTE — H&P (Signed)
Charles [name] is an 50 y.o. male.   Chief Complaint: right leg pain HPI:  Charles [name] is a 50 year old patient with right leg pain of many months duration.  He denies any history of injury.  He has had pain for years but it has been worse over the past 4 months.  He is ambulating with a limp.  With activity he describes numbness and tingling and burning pain which starts in the lateral lower leg and extends to the top of his foot as well as up the leg.  He works as a Barrister's clerk.  He has had epidural steroid injections by Dr. Ernestina Patches twice this year which helps his back but not his foot.  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  Past Medical History:  Diagnosis Date   Arthritis    also has avascular necrosis    Chronic back pain    Chronic neck pain    Chronic shoulder pain    COPD (chronic obstructive pulmonary disease) (HCC)    GERD (gastroesophageal reflux disease)    tums  OTC  medication   Headache    HTN (hypertension)    no longer   Hypothyroidism    Psoriatic arthritis ([place])    Sleep apnea    tested more than 5 yrs ago...negative results    Past Surgical History:  Procedure Laterality Date   COLONOSCOPY WITH PROPOFOL N/A 12/09/2015   four 5 mm polyps in sigmoid colon and descending colon. Pathology revealed tubular adenomas. Recommended repeat in 5 years.   ESOPHAGOGASTRODUODENOSCOPY (EGD) WITH PROPOFOL N/A 12/09/2015   Procedure: ESOPHAGOGASTRODUODENOSCOPY (EGD) WITH PROPOFOL;  Surgeon: Daneil Dolin, MD;  [facility];  Service: Endoscopy;  Laterality: N/A;   ESOPHAGOGASTRODUODENOSCOPY (EGD) WITH PROPOFOL N/A 06/09/2018   mild erosive reflux esophagitis, non-critical Schatzki's ring s/p dilation   JOINT REPLACEMENT     left hip 2017   MALONEY DILATION N/A 06/09/2018   Procedure: MALONEY DILATION;  Surgeon: Daneil Dolin, MD;  [facility];  Service: Endoscopy;  Laterality: N/A;   NECK  SURGERY     SHOULDER SURGERY     TOTAL HIP ARTHROPLASTY Left 07/30/2015   Procedure: TOTAL HIP ARTHROPLASTY;  Surgeon: Garald Balding, MD;  Location: [facility];  Service: Orthopedics;  Laterality: Left;   TOTAL HIP ARTHROPLASTY Right 09/22/2016   TOTAL HIP ARTHROPLASTY Right 09/22/2016   Procedure: RIGHT TOTAL HIP ARTHROPLASTY;  Surgeon: Garald Balding, MD;  Location: [facility];  Service: Orthopedics;  Laterality: Right;    Family History  Problem Relation Age of Onset   Heart failure Mother    Diabetes Mother    Hypertension Mother    Heart failure Father    Prostate cancer Father    Arthritis Father    Hypertension Sister    Heart attack Sister    Hypercholesterolemia Sister    Healthy Daughter    Colon cancer Neg Hx    Gastric cancer Neg Hx    Esophageal cancer Neg Hx    Social History:  reports that he has been smoking cigarettes. He has a 12.50 pack-year smoking history. He has never used smokeless tobacco. He reports current alcohol use of about 16.0 standard drinks per week. He reports that he does not use drugs.  Allergies: No Known Allergies  Medications Prior to Admission  Medication Sig Dispense Refill   albuterol (PROVENTIL HFA;VENTOLIN HFA) 108 (90 Base) MCG/ACT  inhaler Inhale 1-2 puffs into the lungs every 4 (four) hours as needed for wheezing or shortness of breath.      levothyroxine (SYNTHROID) 88 MCG tablet Take 88 mcg by mouth daily before breakfast.     meloxicam (MOBIC) 15 MG tablet Take 15 mg by mouth in the morning.     oxyCODONE-acetaminophen (PERCOCET) 10-325 MG tablet Take 1 tablet by mouth every 6 (six) hours as needed for pain.      No results found for this or any previous visit (from the past 48 hour(s)). No results found.  Review of Systems  Musculoskeletal:  Positive for arthralgias.  All other systems reviewed and are negative.  Blood pressure (!) 160/79, pulse 76, temperature 98 F (36.7 C), temperature source Oral, resp. rate 18, height 6'  (1.829 m), weight 89.8 kg, SpO2 95 %. Physical Exam Vitals reviewed.  HENT:     Head: Normocephalic.     Nose: Nose normal.     Mouth/Throat:     Mouth: Mucous membranes are moist.  Eyes:     Pupils: Pupils are equal, round, and reactive to light.  Cardiovascular:     Rate and Rhythm: Normal rate.     Pulses: Normal pulses.  Pulmonary:     Effort: Pulmonary effort is normal.  Abdominal:     General: Abdomen is flat.  Musculoskeletal:     Cervical back: Normal range of motion.  Skin:    General: Skin is warm.     Capillary Refill: Capillary refill takes less than 2 seconds.  Neurological:     General: No focal deficit present.     Mental Status: He is alert.  Psychiatric:        Mood and Affect: Mood normal.  Ortho exam demonstrates no nerve root tension signs on the right left-hand side.  Is pretty reasonable ankle dorsiflexion plantarflexion strength.  Hernia is present in the midportion of the right lower extremity laterally about 20 cm proximal to the tip of the fibula.  Does have paresthesias on the dorsal aspect of the foot.  Tinel's is positive at this location.  No discrete masses present.  No tenderness around the fibular head.  Calf circumference is the same right versus left at mid level.  Eversion strength symmetric bilaterally at 5 out of 5.   Assessment/Plan Impression is superficial peroneal nerve compression through muscle hernia proximal to the location where the superficial peroneal nerve typically exits.  I think that the muscle herniation is likely compressing the nerve.  Plan is peroneal nerve superficial decompression through that herniation with lateral compartment release.  We will have to extend the compartment release from the herniation distally to its exiting point around the malleolus as well as proximally.  Risk and benefits are discussed including not limited to infection nerve vessel damage incomplete pain relief.  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  Patient understands risk and benefits.  All questions answered.  Also discussed how the contour of his leg may appear different than the right compared to the left.  Also he may have some moderate amount of weakness right versus left just until the muscle tendon unit junction resets and he can resume normal activity  Anderson Malta, MD 08/19/2020, 11:17 AM

## 2020-08-19 NOTE — Anesthesia Postprocedure Evaluation (Signed)
Anesthesia Post Note  Patient: Charles [name]  Procedure(s) Performed: right leg superficial peroneal nerve decompression, lateral compartment release (Right)     Patient location during evaluation: PACU Anesthesia Type: General Level of consciousness: awake Pain management: pain level controlled Vital Signs Assessment: post-procedure vital signs reviewed and stable Respiratory status: spontaneous breathing and respiratory function stable Cardiovascular status: stable Postop Assessment: no apparent nausea or vomiting Anesthetic complications: no   No notable events documented.  Last Vitals:  Vitals:   08/19/20 1450 08/19/20 1500  BP: (!) 143/86 (!) 142/85  Pulse: 72 67  Resp: 15 11  Temp:    SpO2: 96% 96%    Last Pain:  Vitals:   08/19/20 1500  TempSrc:   PainSc: 3                  Candra R Analeese Andreatta

## 2020-08-19 NOTE — Progress Notes (Signed)
Orthopedic Tech Progress Note Patient Details:  Charles [name] 1970-05-13 [id number]  PACU RN called requesting a pair of CRUTCHES for patient   Ortho Devices Type of Ortho Device: Crutches Ortho Device/Splint Interventions: Application, Adjustment   Post Interventions Patient Tolerated: Well, Ambulated well Instructions Provided: Poper ambulation with device, Care of device  Janit Pagan 08/19/2020, 3:57 PM

## 2020-08-20 ENCOUNTER — Encounter (HOSPITAL_COMMUNITY): Payer: Self-pay | Admitting: Orthopedic Surgery

## 2020-08-20 NOTE — Op Note (Signed)
Charles [name], TRUDE MEDICAL RECORD NO: [id number] ACCOUNT NO: [id number] DATE OF BIRTH: [birth year] FACILITY: [facility] LOCATION: [facility] PHYSICIAN: Yetta Barre. Marlou Sa, MD  Operative Report   DATE OF PROCEDURE: 08/19/2020  PREOPERATIVE DIAGNOSIS:  Right superficial peroneal nerve compression.  POSTOPERATIVE DIAGNOSIS:  Right superficial peroneal nerve compression.  PROCEDURE:  Right superficial peroneal nerve decompression with lateral compartment release.  SURGEON ATTENDING:  Meredith Pel, MD  ASSISTANT:  Annie Main, PA  INDICATIONS:  The patient is a 50 year old patient with right superficial nerve compression.  He has a herniation of the peroneus brevis through the fascia, about 15 to 16 cm above the lateral malleolar tip. Presents now for operative management after  explanation of risks and benefits.  DESCRIPTION OF PROCEDURE:  The patient was brought to the operating room where general anesthetic was induced.  Preoperative antibiotics were administered.  Timeout was called.  Right leg pre-prepped with alcohol and Betadine and allowed to air dry,  prepped with DuraPrep solution and draped in a sterile manner.  Ioban used to cover the operative field.  The leg was elevated and exsanguinated with Esmarch wrap and tourniquet was inflated.  Timeout was called.  Incision made from the anterior portion  of the lateral malleolus up about 20 cm.  Skin and subcutaneous tissue were sharply divided.  The herniation through the lateral fascia of the peroneus brevis was identified.  The superficial peroneal nerve was in this herniated region.  The fascia was  then extended under direct visualization proximally for complete lateral compartment release with nerve under direct visualization.  Then, dissection was performed distally.  Branches were maintained.  Dissection was taken down to where the superficial  peroneal nerve exited the lateral compartment fascia.  Nerve was fairly swollen in this  area indicating compression.  Dissection was performed up until it branched into two branches just past the joint.  Peroneus tertius was examined and there was a bump  of tissue in this region, which was prominent from the underlying anterolateral corner of the tibia along with the peroneus tertius.  This was debrided and that capsule was closed using inverted 3-0 Vicryl sutures.  A thorough decompression was  performed.  Tourniquet was released.  Bleeding points encountered were controlled using electrocautery.  Skin edges were anesthetized using Marcaine, morphine, clonidine.  The skin was then closed using a combination of 3-0 Vicryl and 2-0 Vicryl followed  by 3-0 Monocryl and Steri-Strips and an Aquacel dressing.  The patient tolerated the procedure well without immediate complication.  Luke's assistance was required at all times for retraction, opening, closing, mobilization of tissue.  His assistance  was a medical necessity.   SHW D: 08/19/2020 1:15:38 pm T: 08/19/2020 9:23:00 pm  JOB: [id number]

## 2020-08-26 ENCOUNTER — Telehealth: Payer: Self-pay | Admitting: Radiology

## 2020-08-26 NOTE — Telephone Encounter (Signed)
Patient called states that he had surgery x 1 week ago for right leg superficial peroneal nerve decompression, lateral compartment release, he states that he is having some swelling in the foot. He says that he has been elevating as much as he can and still be able to do what he needs to do. I advised that he should stay off it as much as possible and to keep it elevated above the heart, as in lay with chest flat on couch and 2-3 pillows under the leg to raise it about the heart. I advised that I would pass this message to Dr. Randel Pigg assistant and have her to call him back with further instructions.

## 2020-08-26 NOTE — Telephone Encounter (Signed)
IC discussed with patient. If symptoms worsen he will reach out. No hx of DVT and he is already taking aspirin.

## 2020-08-26 NOTE — Telephone Encounter (Signed)
Good advice.  Elevate and move the foot is much as possible.  Foot swelling not unexpected in this circumstance.  Okay to start taking 1 baby aspirin a day.  Thanks

## 2020-08-28 ENCOUNTER — Ambulatory Visit (INDEPENDENT_AMBULATORY_CARE_PROVIDER_SITE_OTHER): Payer: Managed Care, Other (non HMO) | Admitting: Orthopedic Surgery

## 2020-08-28 ENCOUNTER — Other Ambulatory Visit: Payer: Self-pay

## 2020-08-28 ENCOUNTER — Encounter: Payer: Self-pay | Admitting: Orthopedic Surgery

## 2020-08-28 DIAGNOSIS — G5701 Lesion of sciatic nerve, right lower limb: Secondary | ICD-10-CM

## 2020-08-31 ENCOUNTER — Encounter: Payer: Self-pay | Admitting: Orthopedic Surgery

## 2020-08-31 DIAGNOSIS — G5701 Lesion of sciatic nerve, right lower limb: Secondary | ICD-10-CM

## 2020-08-31 NOTE — Progress Notes (Signed)
Post-Op Visit Note   Patient: Charles [name]           Date of Birth: Jan 20, 1970           MRN: [id number] Visit Date: 08/28/2020 PCP: Charles Squibb, MD   Assessment & Plan:  Chief Complaint:  Chief Complaint  Patient presents with   Right Leg - Post-op Follow-up   Visit Diagnoses:  1. Compression of common peroneal nerve of right lower extremity     Plan: Charles [name] is a 50 year old patient underwent right leg superficial peroneal nerve decompression and lateral compartment release 08/19/2020.  Having some swelling in his foot.  Overall he is doing reasonably well.  Photos are reviewed with the patient which does show compression.  I would like to be weightbearing as tolerated continue to move the ankle is much as possible to diminish the chances for recurrent scar formation around the nerve.  Discussion in 3 weeks about returning to work.  Follow-Up Instructions: Return in about 3 weeks (around 09/18/2020).   Orders:  No orders of the defined types were placed in this encounter.  No orders of the defined types were placed in this encounter.   Imaging: No results found.  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   Bilateral sacroiliitis ([place]) 05/22/2016   Psoriatic arthritis ([place]) 05/22/2016   High risk medications (not anticoagulants) long-term use 05/22/2016   DJD (degenerative joint disease), cervical 05/22/2016   Osteoarthritis of spine with radiculopathy, lumbar region 05/22/2016   Cephalalgia 03/16/2016   Chronic daily headache 03/16/2016   Neck pain 03/16/2016   Hemorrhoids 03/13/2016   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/26/2015   Insomnia 12/26/2015   Abdominal pain, epigastric 11/13/2015   Dark stools 11/13/2015   Rectal bleeding 11/13/2015   Numbness 09/26/2015   Neuropathic pain involving left lateral femoral cutaneous nerve 09/26/2015   Avascular necrosis of left femoral head ([place]) 07/30/2015   Avascular necrosis of right femoral head ([place]) 07/30/2015   Psoriasis 07/30/2015   Chronic obstructive pulmonary disease (COPD) ([place]) 07/30/2015   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   Past Medical History:  Diagnosis Date   Arthritis    also has avascular necrosis    Chronic back pain    Chronic neck pain    Chronic shoulder pain    COPD (chronic obstructive pulmonary disease) (HCC)    GERD (gastroesophageal reflux disease)    tums  OTC  medication   Headache    HTN (hypertension)    no longer   Hypothyroidism    Psoriatic arthritis ([place])    Sleep apnea    tested more than 5 yrs ago...negative results    Family History  Problem Relation Age of Onset   Heart failure Mother    Diabetes Mother    Hypertension Mother    Heart failure Father    Prostate cancer Father    Arthritis Father    Hypertension Sister    Heart attack Sister    Hypercholesterolemia Sister    Healthy Daughter    Colon cancer Neg Hx  Gastric cancer Neg Hx    Esophageal cancer Neg Hx     Past Surgical History:  Procedure Laterality Date   COLONOSCOPY WITH PROPOFOL N/A 12/09/2015   four 5 mm polyps in sigmoid colon and descending colon. Pathology revealed tubular adenomas. Recommended repeat in 5 years.   ESOPHAGOGASTRODUODENOSCOPY (EGD) WITH PROPOFOL N/A 12/09/2015   Procedure: ESOPHAGOGASTRODUODENOSCOPY (EGD) WITH PROPOFOL;  Surgeon: Charles Dolin, MD;  [facility];  Service: Endoscopy;  Laterality: N/A;   ESOPHAGOGASTRODUODENOSCOPY (EGD) WITH PROPOFOL N/A 06/09/2018   mild erosive reflux esophagitis, non-critical Schatzki's ring s/p dilation    JOINT REPLACEMENT     left hip 2017   MALONEY DILATION N/A 06/09/2018   Procedure: MALONEY DILATION;  Surgeon: Charles Dolin, MD;  [facility];  Service: Endoscopy;  Laterality: N/A;   NECK SURGERY     SHOULDER SURGERY     SUPERFICIAL PERONEAL NERVE RELEASE Right 08/19/2020   Procedure: right leg superficial peroneal nerve decompression, lateral compartment release;  Surgeon: Charles Pel, MD;  Location: [facility];  Service: Orthopedics;  Laterality: Right;   TOTAL HIP ARTHROPLASTY Left 07/30/2015   Procedure: TOTAL HIP ARTHROPLASTY;  Surgeon: Charles Balding, MD;  Location: [facility];  Service: Orthopedics;  Laterality: Left;   TOTAL HIP ARTHROPLASTY Right 09/22/2016   TOTAL HIP ARTHROPLASTY Right 09/22/2016   Procedure: RIGHT TOTAL HIP ARTHROPLASTY;  Surgeon: Charles Balding, MD;  Location: [facility];  Service: Orthopedics;  Laterality: Right;   Social History   Occupational History   Not on file  Tobacco Use   Smoking status: Every Day    Packs/day: 0.50    Years: 25.00    Pack years: 12.50    Types: Cigarettes   Smokeless tobacco: Never  Vaping Use   Vaping Use: Former  Substance and Sexual Activity   Alcohol use: Yes    Alcohol/week: 16.0 standard drinks    Types: 16 Cans of beer per week   Drug use: No    Comment: states quit   Sexual activity: Not on file

## 2020-09-10 ENCOUNTER — Other Ambulatory Visit: Payer: Self-pay | Admitting: Surgical

## 2020-09-10 DIAGNOSIS — G5701 Lesion of sciatic nerve, right lower limb: Secondary | ICD-10-CM

## 2020-09-18 ENCOUNTER — Encounter: Payer: Self-pay | Admitting: Orthopedic Surgery

## 2020-09-18 ENCOUNTER — Ambulatory Visit (INDEPENDENT_AMBULATORY_CARE_PROVIDER_SITE_OTHER): Payer: Managed Care, Other (non HMO) | Admitting: Orthopedic Surgery

## 2020-09-18 ENCOUNTER — Other Ambulatory Visit: Payer: Self-pay

## 2020-09-18 DIAGNOSIS — G5701 Lesion of sciatic nerve, right lower limb: Secondary | ICD-10-CM

## 2020-09-18 NOTE — Progress Notes (Signed)
Post-Op Visit Note   Patient: Charles [name]           Date of Birth: [birth year]           MRN: [id number] Visit Date: 09/18/2020 PCP: Celene Squibb, MD   Assessment & Plan:  Chief Complaint:  Chief Complaint  Patient presents with   Right Lower Leg - Routine Post Op   Visit Diagnoses:  1. Compression of common peroneal nerve of right lower extremity     Plan: Patient is a 50 year old male who presents s/p right leg superficial peroneal nerve decompression with lateral compartment release on 08/19/2020.  Reports improvement of his symptoms over the last several weeks.  He still walks with a slight limp but overall the amount of time that he can stand on his foot at 1 time is getting better better.  Main complaint is pain in the midportion of his lateral calf is worse with activity and standing for long periods of time.  He feels he is okay to return to work next week with some restrictions.  On exam he has incision that is healing well for the most part with some scab formation primarily in the distal aspect of the incision.  Suspect this is likely due to his smoking history as well as him keeping the ankle moving constantly in order to prevent scar formation.  He has no surrounding erythema.  He denies any fevers, chills, drainage from the incision and there is no expressible drainage on exam today.  No concern for infection at this time.  Plan to continue monitoring the incision.  He was cautioned about the red flag symptoms of infection or any reasons to call the office.  Additionally, on exam he has excellent dorsiflexion and eversion strength with sensation intact through the dorsum of the foot.  Plan for him to return to work next week.  He will contact the office if he is not able to finish his shifts or if he needs additional restrictions.  Follow-up in 4 weeks for clinical recheck with Dr. Marlou Sa at that time.     Follow-Up Instructions: No follow-ups on file.   Orders:  No orders  of the defined types were placed in this encounter.  No orders of the defined types were placed in this encounter.   Imaging: No results found.  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    Pain in right leg 08/31/2019   Grade I hemorrhoids 03/03/2019   GERD (gastroesophageal reflux disease) 05/25/2018   Dysphagia 05/25/2018   Acute left-sided low back pain with left-sided sciatica 02/01/2018   Pain in left hip 02/01/2018   Constipation 05/11/2017   Low back pain 04/05/2017   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   Bilateral sacroiliitis ([place]) 05/22/2016   Psoriatic arthritis ([place]) 05/22/2016   High risk medications (not anticoagulants) long-term use 05/22/2016   DJD (degenerative joint disease), cervical 05/22/2016   Osteoarthritis of spine with radiculopathy, lumbar region 05/22/2016   Cephalalgia 03/16/2016   Chronic daily headache 03/16/2016   Neck pain 03/16/2016   Hemorrhoids 03/13/2016   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   Insomnia 12/26/2015   Abdominal pain, epigastric 11/13/2015   Dark stools 11/13/2015   Rectal bleeding 11/13/2015   Numbness 09/26/2015   Neuropathic pain involving left lateral femoral cutaneous nerve 09/26/2015   Avascular necrosis of left femoral  head ([place]) 07/30/2015   Avascular necrosis of right femoral head ([place]) 07/30/2015   Psoriasis 07/30/2015   Chronic obstructive pulmonary disease (COPD) ([place]) 07/30/2015   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   Past Medical History:  Diagnosis Date   Arthritis    also has avascular necrosis    Chronic back pain    Chronic neck pain    Chronic shoulder pain    COPD (chronic obstructive pulmonary disease) (HCC)    GERD (gastroesophageal reflux disease)    tums  OTC  medication   Headache    HTN (hypertension)    no longer   Hypothyroidism     Psoriatic arthritis ([place])    Sleep apnea    tested more than 5 yrs ago...negative results    Family History  Problem Relation Age of Onset   Heart failure Mother    Diabetes Mother    Hypertension Mother    Heart failure Father    Prostate cancer Father    Arthritis Father    Hypertension Sister    Heart attack Sister    Hypercholesterolemia Sister    Healthy Daughter    Colon cancer Neg Hx    Gastric cancer Neg Hx    Esophageal cancer Neg Hx     Past Surgical History:  Procedure Laterality Date   COLONOSCOPY WITH PROPOFOL N/A 12/09/2015   four 5 mm polyps in sigmoid colon and descending colon. Pathology revealed tubular adenomas. Recommended repeat in 5 years.   ESOPHAGOGASTRODUODENOSCOPY (EGD) WITH PROPOFOL N/A 12/09/2015   Procedure: ESOPHAGOGASTRODUODENOSCOPY (EGD) WITH PROPOFOL;  Surgeon: Daneil Dolin, MD;  [facility];  Service: Endoscopy;  Laterality: N/A;   ESOPHAGOGASTRODUODENOSCOPY (EGD) WITH PROPOFOL N/A 06/09/2018   mild erosive reflux esophagitis, non-critical Schatzki's ring s/p dilation   JOINT REPLACEMENT     left hip 2017   MALONEY DILATION N/A 06/09/2018   Procedure: MALONEY DILATION;  Surgeon: Daneil Dolin, MD;  [facility];  Service: Endoscopy;  Laterality: N/A;   NECK SURGERY     SHOULDER SURGERY     SUPERFICIAL PERONEAL NERVE RELEASE Right 08/19/2020   Procedure: right leg superficial peroneal nerve decompression, lateral compartment release;  Surgeon: Meredith Pel, MD;  Location: [facility];  Service: Orthopedics;  Laterality: Right;   TOTAL HIP ARTHROPLASTY Left 07/30/2015   Procedure: TOTAL HIP ARTHROPLASTY;  Surgeon: Garald Balding, MD;  Location: [facility];  Service: Orthopedics;  Laterality: Left;   TOTAL HIP ARTHROPLASTY Right 09/22/2016   TOTAL HIP ARTHROPLASTY Right 09/22/2016   Procedure: RIGHT TOTAL HIP ARTHROPLASTY;  Surgeon: Garald Balding, MD;  Location: [facility];  Service: Orthopedics;  Laterality: Right;   Social  History   Occupational History   Not on file  Tobacco Use   Smoking status: Every Day    Packs/day: 0.50    Years: 25.00    Pack years: 12.50    Types: Cigarettes   Smokeless tobacco: Never  Vaping Use   Vaping Use: Former  Substance and Sexual Activity   Alcohol use: Yes    Alcohol/week: 16.0 standard drinks    Types: 16 Cans of beer per week   Drug use: No    Comment: states quit   Sexual activity: Not on file

## 2020-09-26 ENCOUNTER — Other Ambulatory Visit: Payer: Self-pay | Admitting: Surgical

## 2020-09-26 DIAGNOSIS — G5701 Lesion of sciatic nerve, right lower limb: Secondary | ICD-10-CM

## 2020-10-16 ENCOUNTER — Other Ambulatory Visit: Payer: Self-pay

## 2020-10-16 ENCOUNTER — Ambulatory Visit (INDEPENDENT_AMBULATORY_CARE_PROVIDER_SITE_OTHER): Payer: Managed Care, Other (non HMO) | Admitting: Orthopedic Surgery

## 2020-10-16 ENCOUNTER — Encounter: Payer: Self-pay | Admitting: Orthopedic Surgery

## 2020-10-16 DIAGNOSIS — G5701 Lesion of sciatic nerve, right lower limb: Secondary | ICD-10-CM

## 2020-10-16 NOTE — Progress Notes (Signed)
Post-Op Visit Note   Patient: Charles [name]           Date of Birth: 1970/04/08           MRN: [id number] Visit Date: 10/16/2020 PCP: Celene Squibb, MD   Assessment & Plan:  Chief Complaint:  Chief Complaint  Patient presents with   Right Leg - Routine Post Op    right leg superficial peroneal nerve decompression with lateral compartment release on 08/19/2020   Visit Diagnoses:  1. 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  Reports continued soreness and is ambulating with a limp.  He has been able to get back to work.  Tried Neurontin without much relief.  On exam he has good ankle dorsiflexion eversion and plantarflexion strength on the right left-hand side.  No skin color differences or temperature difference between the right and left leg.  Pedal pulses palpable.  Does still have some paresthesias on the dorsal aspect of the foot.  Plan at this time is no intervention indicated.  Continue with as much ankle range of motion as possible.  4 months return for clinical recheck.  I think it may take that long for nerve recovery to occur.  Follow-Up Instructions: No follow-ups on file.   Orders:  No orders of the defined types were placed in this encounter.  No orders of the defined types were placed in this encounter.   Imaging: No results found.  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    Pain in right leg 08/31/2019   Grade I hemorrhoids 03/03/2019   GERD (gastroesophageal reflux disease) 05/25/2018   Dysphagia 05/25/2018   Acute left-sided low back pain with left-sided sciatica 02/01/2018   Pain in left hip 02/01/2018   Constipation 05/11/2017   Low back pain 04/05/2017   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   Bilateral sacroiliitis  ([place]) 05/22/2016   Psoriatic arthritis ([place]) 05/22/2016   High risk medications (not anticoagulants) long-term use 05/22/2016   DJD (degenerative joint disease), cervical 05/22/2016   Osteoarthritis of spine with radiculopathy, lumbar region 05/22/2016   Cephalalgia 03/16/2016   Chronic daily headache 03/16/2016   Neck pain 03/16/2016   Hemorrhoids 03/13/2016   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   Insomnia 12/26/2015   Abdominal pain, epigastric 11/13/2015   Dark stools 11/13/2015   Rectal bleeding 11/13/2015   Numbness 09/26/2015   Neuropathic pain involving left lateral femoral cutaneous nerve 09/26/2015   Avascular necrosis of left femoral head ([place]) 07/30/2015   Avascular necrosis of right femoral head ([place]) 07/30/2015   Psoriasis 07/30/2015   Chronic obstructive pulmonary disease (COPD) ([place]) 07/30/2015   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   Past Medical History:  Diagnosis Date   Arthritis    also has avascular necrosis    Chronic back pain    Chronic neck pain    Chronic shoulder pain    COPD (chronic obstructive pulmonary disease) ([facility])    GERD (gastroesophageal reflux disease)    tums  OTC  medication   Headache    HTN (hypertension)    no longer   Hypothyroidism    Psoriatic arthritis ([place])    Sleep apnea    tested more than 5 yrs ago...negative results  Family History  Problem Relation Age of Onset   Heart failure Mother    Diabetes Mother    Hypertension Mother    Heart failure Father    Prostate cancer Father    Arthritis Father    Hypertension Sister    Heart attack Sister    Hypercholesterolemia Sister    Healthy Daughter    Colon cancer Neg Hx    Gastric cancer Neg Hx    Esophageal cancer Neg Hx     Past Surgical History:  Procedure Laterality Date   COLONOSCOPY WITH PROPOFOL N/A 12/09/2015   four 5 mm polyps in sigmoid colon and descending colon. Pathology revealed tubular adenomas. Recommended  repeat in 5 years.   ESOPHAGOGASTRODUODENOSCOPY (EGD) WITH PROPOFOL N/A 12/09/2015   Procedure: ESOPHAGOGASTRODUODENOSCOPY (EGD) WITH PROPOFOL;  Surgeon: Daneil Dolin, MD;  [facility];  Service: Endoscopy;  Laterality: N/A;   ESOPHAGOGASTRODUODENOSCOPY (EGD) WITH PROPOFOL N/A 06/09/2018   mild erosive reflux esophagitis, non-critical Schatzki's ring s/p dilation   JOINT REPLACEMENT     left hip 2017   MALONEY DILATION N/A 06/09/2018   Procedure: MALONEY DILATION;  Surgeon: Daneil Dolin, MD;  [facility];  Service: Endoscopy;  Laterality: N/A;   NECK SURGERY     SHOULDER SURGERY     SUPERFICIAL PERONEAL NERVE RELEASE Right 08/19/2020   Procedure: right leg superficial peroneal nerve decompression, lateral compartment release;  Surgeon: Meredith Pel, MD;  Location: [facility];  Service: Orthopedics;  Laterality: Right;   TOTAL HIP ARTHROPLASTY Left 07/30/2015   Procedure: TOTAL HIP ARTHROPLASTY;  Surgeon: Garald Balding, MD;  Location: [facility];  Service: Orthopedics;  Laterality: Left;   TOTAL HIP ARTHROPLASTY Right 09/22/2016   TOTAL HIP ARTHROPLASTY Right 09/22/2016   Procedure: RIGHT TOTAL HIP ARTHROPLASTY;  Surgeon: Garald Balding, MD;  Location: [facility];  Service: Orthopedics;  Laterality: Right;   Social History   Occupational History   Not on file  Tobacco Use   Smoking status: Every Day    Packs/day: 0.50    Years: 25.00    Pack years: 12.50    Types: Cigarettes   Smokeless tobacco: Never  Vaping Use   Vaping Use: Former  Substance and Sexual Activity   Alcohol use: Yes    Alcohol/week: 16.0 standard drinks    Types: 16 Cans of beer per week   Drug use: No    Comment: states quit   Sexual activity: Not on file

## 2020-11-19 ENCOUNTER — Encounter: Payer: Self-pay | Admitting: *Deleted

## 2021-01-15 ENCOUNTER — Encounter: Payer: Self-pay | Admitting: Internal Medicine

## 2021-02-17 ENCOUNTER — Ambulatory Visit: Payer: Managed Care, Other (non HMO) | Admitting: Surgical

## 2021-02-19 ENCOUNTER — Ambulatory Visit: Payer: Managed Care, Other (non HMO) | Admitting: Orthopedic Surgery

## 2021-03-03 ENCOUNTER — Ambulatory Visit: Payer: Managed Care, Other (non HMO) | Admitting: Orthopedic Surgery

## 2021-03-14 ENCOUNTER — Ambulatory Visit: Payer: Managed Care, Other (non HMO) | Admitting: Surgical

## 2021-03-31 ENCOUNTER — Ambulatory Visit: Payer: Managed Care, Other (non HMO) | Admitting: Physical Medicine and Rehabilitation

## 2021-03-31 ENCOUNTER — Telehealth: Payer: Self-pay | Admitting: Physical Medicine and Rehabilitation

## 2021-03-31 NOTE — Telephone Encounter (Signed)
Patient called needing to schedule an appointment for an injection in his back.    [phone]  ?

## 2021-04-02 ENCOUNTER — Ambulatory Visit (INDEPENDENT_AMBULATORY_CARE_PROVIDER_SITE_OTHER): Payer: Managed Care, Other (non HMO) | Admitting: Physical Medicine and Rehabilitation

## 2021-04-02 ENCOUNTER — Encounter: Payer: Self-pay | Admitting: Physical Medicine and Rehabilitation

## 2021-04-02 VITALS — BP 128/80 | HR 73

## 2021-04-02 DIAGNOSIS — M47816 Spondylosis without myelopathy or radiculopathy, lumbar region: Secondary | ICD-10-CM | POA: Diagnosis not present

## 2021-04-02 DIAGNOSIS — R202 Paresthesia of skin: Secondary | ICD-10-CM

## 2021-04-02 DIAGNOSIS — G8929 Other chronic pain: Secondary | ICD-10-CM | POA: Diagnosis not present

## 2021-04-02 DIAGNOSIS — M545 Low back pain, unspecified: Secondary | ICD-10-CM

## 2021-04-02 NOTE — Progress Notes (Signed)
? ?SQUARE JOWETT - 51 y.o. male MRN [id number]  Date of birth: [birth year] ? ?Office Visit Note: ?Visit Date: 04/02/2021 ?PCP: Celene Squibb, MD ?Referred by: Celene Squibb, MD ? ?Subjective: ?Chief Complaint  ?Patient presents with  ? Lower Back - Pain  ? Right Leg - Pain  ? Left Leg - Pain  ? ?HPI: Charles [name] is a 51 y.o. male who comes in today for evaluation of chronic, worsening and severe bilateral lower back pain and intermittent numbness/tingling sensation to bilateral anterolateral thigh regions. Patient reports pain has been ongoing for several years and has recently worsened over the last several months. Patient states his lower back discomfort is most severe pain at this time. He states pain is exacerbated by moving from sitting to standing position, standing and walking. He describes pain as constant sore and stiff sensation, currently rates as 7 out of 10. Patient states his pain is most severe in the mornings when attempting to get out of bed. Patient reports some relief of pain with home exercise regimen, use of back brace, heating pad, rest and medication. Patient currently taking 10-325 mg Percocet that is prescribed by his primary care provider Dr. Allyn Kenner.  Patients lumbar MRI from 2021 exhibits moderate facet arthropathy bilaterally at L4-L5 and L5-S1 and moderate bilateral foraminal narrowing at L5-S1. No high grade spinal canal stenosis noted. Patient was previously seen in our office and has had multiple lumbar epidural steroid injections performed over the years with some relief of pain. Patient had right leg superficial peroneal nerve decompression performed by Dr. Alphonzo Severance in August of 2022 and reports no issues post procedure. Patient denies focal weakness, numbness and tingling. Patient denies recent trauma or falls.  ? ?Review of Systems  ?Musculoskeletal:  Positive for back pain.  ?Neurological:  Positive for tingling. Negative for focal weakness and weakness.  ?All other systems  reviewed and are negative. Otherwise per HPI. ? ?Assessment & Plan: ?Visit Diagnoses:  ?  ICD-10-CM   ?1. Chronic bilateral low back pain without sciatica  M54.50 Ambulatory referral to Physical Medicine Rehab  ? G89.29   ?  ?2. Spondylosis without myelopathy or radiculopathy, lumbar region  M47.816 Ambulatory referral to Physical Medicine Rehab  ?  ?3. Paresthesia of skin  R20.2 Ambulatory referral to Physical Medicine Rehab  ?  ?4. Facet arthropathy, lumbar  M47.816 Ambulatory referral to Physical Medicine Rehab  ?  ?   ?Plan: Findings:  ?Chronic, worsening and severe axial back pain and intermittent numbness/tingling to bilateral anterolateral thigh regions. Patients most severe pain is localized to bilateral lower back.  Patient continues to have severe pain despite good conservative therapy such as home exercise regimen, rest and use of medications.  Patient's clinical presentation and exam are consistent with facet mediated pain, he does have significant pain with lumbar extension.  We believe the next step is to perform diagnostic and hopefully therapeutic bilateral L4-L5 and L5-S1 facet joint/medial branch blocks under fluoroscopic guidance.  If patient gets good pain relief with facet joint blocks we did discuss the possibility of longer sustained pain relief with radiofrequency ablation procedure.  I did provide patient with educational material regarding radiofrequency ablation procedure for him to take home and review.  Patient encouraged to remain active and to continue home exercise regimen as tolerated.  No red flag symptoms noted upon exam today. ? ?Patients continues with directed home exercise program. Current medication management is not beneficial in increasing his functional status.  Please note that procedures are done as part of a comprehensive orthopedic and pain management program with access to in-house orthopedics, spine surgery and physical therapy as well as access to [facility] biopsychosocial counseling if needed. ?   ? ?Meds & Orders: No orders of the defined types were placed in this encounter. ?  ?Orders Placed This Encounter  ?Procedures  ? Ambulatory referral to Physical Medicine Rehab  ?  ?Follow-up: Return for Bilateral L4-L5 and L5-S1 facet joint/medial branch blocks.  ? ?Procedures: ?No procedures performed  ?   ? ?Clinical History: ?MRI LUMBAR SPINE WITHOUT CONTRAST  ?   ?TECHNIQUE:  ?Multiplanar, multisequence MR imaging of the lumbar spine was  ?performed. No intravenous contrast was administered.  ?   ?COMPARISON:  05/31/2017  ?   ?FINDINGS:  ?Segmentation:  5 lumbar type vertebrae  ?   ?Alignment: Reversal of lumbar lordosis with mild L5-S1  ?retrolisthesis.  ?   ?Vertebrae:  No fracture, evidence of discitis, or bone lesion.  ?   ?Conus medullaris and cauda equina: Conus extends to the L1 level.  ?Conus and cauda equina appear normal.  ?   ?Paraspinal and other soft tissues: Negative  ?   ?Disc levels:  ?   ?T12- L1: Unremarkable.  ?   ?L1-L2: Unremarkable.  ?   ?L2-L3: Disc narrowing and ventral spondylitic spurring. No  ?impingement  ?   ?L3-L4: Disc narrowing and mild bulging with ventral spondylitic  ?spurring. No impingement  ?   ?L4-L5: Disc narrowing and ventral spondylitic spurring. Right  ?preferential far-lateral disc bulging or protrusion which was  ?highlighted on prior. No neural compression.  ?   ?L5-S1:Disc narrowing and bulging with moderate bilateral foraminal  ?narrowing based on sagittal slices, slightly greater on the right.  ?Minor facet spurring. Widely patent spinal canal.  ?   ?IMPRESSION:  ?1. 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  ?2. Moderate bilateral foraminal narrowing at L5-S1.  ?3. Stable compared to 2019.  ?   ?   ?Electronically Signed  ?  By: Monte Fantasia M.D.  ?  On: 08/27/2019 13:07  ? ?He reports that he has been smoking cigarettes. He has a 12.50 pack-year smoking history. He has never used  smokeless tobacco. No results for input(s): HGBA1C, LABURIC in the last 8760 hours. ? ?Objective:  VS:  HT:    WT:   BMI:     BP:128/80  HR:73bpm  TEMP: ( )  RESP:  ?Physical Exam ?Vitals and nursing note reviewed.  ?HENT:  ?   Head: Normocephalic and atraumatic.  ?   Right Ear: External ear normal.  ?   Left Ear: External ear normal.  ?   Nose: Nose normal.  ?   Mouth/Throat:  ?   Mouth: Mucous membranes are moist.  ?Eyes:  ?   Extraocular Movements: Extraocular movements intact.  ?Cardiovascular:  ?   Rate and Rhythm: Normal rate.  ?   Pulses: Normal pulses.  ?Pulmonary:  ?   Effort: Pulmonary effort is normal.  ?Abdominal:  ?   General: Abdomen is flat. There is no distension.  ?Musculoskeletal:     ?   General: Tenderness present.  ?   Cervical back: Normal range of motion.  ?   Comments: Pt is slow to rise from seated position to standing. Concordant low back pain with facet loading, lumbar spine extension and rotation. Strong distal strength without clonus, no pain upon palpation  of greater trochanters. Sensation intact bilaterally. Walks independently, gait steady.   ?Skin: ?   General: Skin is warm and dry.  ?   Capillary Refill: Capillary refill takes less than 2 seconds.  ?Neurological:  ?   General: No focal deficit present.  ?   Mental Status: He is alert and oriented to person, place, and time.  ?Psychiatric:     ?   Mood and Affect: Mood normal.     ?   Behavior: Behavior normal.  ?  ?Ortho Exam ? ?Imaging: ?No results found. ? ?Past Medical/Family/Surgical/Social History: ?Medications & Allergies reviewed per EMR, new medications updated. ?Patient Active Problem List  ? Diagnosis Date Noted  ? 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 Pain in right leg 08/31/2019  ? Grade I hemorrhoids 03/03/2019  ? GERD (gastroesophageal reflux disease) 05/25/2018  ? Dysphagia 05/25/2018  ? Acute left-sided low back pain with left-sided sciatica 02/01/2018  ? Pain in left hip 02/01/2018   ? Constipation 05/11/2017  ? Low back pain 04/05/2017  ? 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 Bilateral sacroiliitis ([place]) 05/22/2016  ? Psoriatic arthritis ([place]) 05/22/2016  ? High risk medica

## 2021-04-02 NOTE — Progress Notes (Signed)
Pt state lower back pain that travels down both legs, mostly his right . Pt state walking and standing makes the pain worse. Pt state he takes pain meds and uses his and back brace to help ease his pain. ? ?Numeric Pain Rating Scale and Functional Assessment ?Average Pain 10 ?Pain Right Now 7 ?My pain is constant, burning, dull, and tingling ?Pain is worse with: walking, standing, and some activites ?Pain improves with: heat/ice and medication ? ? ?In the last MONTH (on 0-10 scale) has pain interfered with the following? ? ?1. General activity like being  able to carry out your everyday physical activities such as walking, climbing stairs, carrying groceries, or moving a chair?  ?Rating(5) ? ?2. Relation with others like being able to carry out your usual social activities and roles such as  activities at home, at work and in your community. ?Rating(6) ? ?3. Enjoyment of life such that you have  been bothered by emotional problems such as feeling anxious, depressed or irritable?  ?Rating(7) ? ?

## 2021-04-28 ENCOUNTER — Encounter: Payer: Self-pay | Admitting: Physical Medicine and Rehabilitation

## 2021-04-28 ENCOUNTER — Ambulatory Visit (INDEPENDENT_AMBULATORY_CARE_PROVIDER_SITE_OTHER): Payer: Managed Care, Other (non HMO) | Admitting: Physical Medicine and Rehabilitation

## 2021-04-28 ENCOUNTER — Ambulatory Visit: Payer: Self-pay

## 2021-04-28 VITALS — BP 147/85 | HR 76

## 2021-04-28 DIAGNOSIS — G8929 Other chronic pain: Secondary | ICD-10-CM | POA: Diagnosis not present

## 2021-04-28 DIAGNOSIS — M545 Low back pain, unspecified: Secondary | ICD-10-CM

## 2021-04-28 DIAGNOSIS — M47816 Spondylosis without myelopathy or radiculopathy, lumbar region: Secondary | ICD-10-CM

## 2021-04-28 MED ORDER — BUPIVACAINE HCL 0.5 % IJ SOLN
3.0000 mL | Freq: Once | INTRAMUSCULAR | Status: AC
  Administered 2021-04-28: 3 mL

## 2021-04-28 NOTE — Progress Notes (Signed)
Pt state lower back pain that travels down both legs, mostly his right . Pt state walking and standing makes the pain worse. Pt state he takes pain meds and uses his and back brace to help ease his pain. ? ?Numeric Pain Rating Scale and Functional Assessment ?Average Pain 7 ? ? ?In the last MONTH (on 0-10 scale) has pain interfered with the following? ? ?1. General activity like being  able to carry out your everyday physical activities such as walking, climbing stairs, carrying groceries, or moving a chair?  ?Rating(10) ? ? ?+Driver, -BT, -Dye Allergies. ? ?

## 2021-04-28 NOTE — Patient Instructions (Signed)

## 2021-05-06 NOTE — Procedures (Signed)
Lumbar Diagnostic Facet Joint Nerve Block with Fluoroscopic Guidance  ? ?Patient: Charles [name]      ?Date of Birth: [birth year] ?MRN: [id number] ?PCP: Celene Squibb, MD      ?Visit Date: 04/28/2021 ?  ?Universal Protocol:    ?Date/Time: 05/02/235:27 AM ? ?Consent Given By: the patient ? ?Position: PRONE ? ?Additional Comments: ?Vital signs were monitored before and after the procedure. ?Patient was prepped and draped in the usual sterile fashion. ?The correct patient, procedure, and site was verified. ? ? ?Injection Procedure Details:  ? ?Procedure diagnoses:  ?1. Chronic bilateral low back pain without sciatica   ?2. Spondylosis without myelopathy or radiculopathy, lumbar region   ?  ? ?Meds Administered:  ?Meds ordered this encounter  ?Medications  ? bupivacaine (MARCAINE) 0.5 % (with pres) injection 3 mL  ?  ? ?Laterality: Bilateral ? ?Location/Site: L4-L5, L3 and L4 medial branches and L5-S1, L4 medial branch and L5 dorsal ramus ? ?Needle: 5.0 in., 25 ga.  Short bevel or Quincke spinal needle ? ?Needle Placement: Oblique pedical ? ?Findings: ? ? -Comments: There was excellent flow of contrast along the articular pillars without intravascular flow. ? ?Procedure Details: ?The fluoroscope beam is vertically oriented in AP and then obliqued 15 to 20 degrees to the ipsilateral side of the desired nerve to achieve the ?Scotty dog? appearance.  The skin over the target area of the junction of the superior articulating process and the transverse process (sacral ala if blocking the L5 dorsal rami) was locally anesthetized with a 1 ml volume of 1% Lidocaine without Epinephrine.  The spinal needle was inserted and advanced in a trajectory view down to the target.  ? ?After contact with periosteum and negative aspirate for blood and CSF, correct placement without intravascular or epidural spread was confirmed by injecting 0.5 ml. of Isovue-250.  A spot radiograph was obtained of this image.   ? ?Next, a 0.5 ml. volume of the  injectate described above was injected. The needle was then redirected to the other facet joint nerves mentioned above if needed. ? ?Prior to the procedure, the patient was given a Pain Diary which was completed for baseline measurements.  After the procedure, the patient rated their pain every 30 minutes and will continue rating at this frequency for a total of 5 hours.  The patient has been asked to complete the Diary and return to [place] by mail, fax or hand delivered as soon as possible. ? ? ?Additional Comments:  ?The patient tolerated the procedure well ?Dressing: 2 x 2 sterile gauze and Band-Aid ?  ? ?Post-procedure details: ?Patient was observed during the procedure. ?Post-procedure instructions were reviewed. ? ?Patient left the clinic in stable condition.  ?

## 2021-05-06 NOTE — Progress Notes (Signed)
? ?[name] - 51 y.o. male MRN [id number]  Date of birth: 1970-09-07 ? ?Office Visit Note: ?Visit Date: 04/28/2021 ?PCP: Celene Squibb, MD ?Referred by: Celene Squibb, MD ? ?Subjective: ?Chief Complaint  ?Patient presents with  ? Lower Back - Pain  ? Right Leg - Pain  ? Left Leg - Pain  ? ?HPI:  Charles [name] is a 51 y.o. male who comes in today at the request of Barnet Pall, FNP for planned Bilateral  L4-5 and L5-S1 Lumbar facet/medial branch block with fluoroscopic guidance.  The patient has failed conservative care including home exercise, medications, time and activity modification.  This injection will be diagnostic and hopefully therapeutic.  Please see requesting physician notes for further details and justification.  Exam has shown concordant pain with facet joint loading.  ? ?ROS Otherwise per HPI. ? ?Assessment & Plan: ?Visit Diagnoses:  ?  ICD-10-CM   ?1. Chronic bilateral low back pain without sciatica  M54.50 XR C-ARM NO REPORT  ? G89.29 Facet Injection  ?  bupivacaine (MARCAINE) 0.5 % (with pres) injection 3 mL  ?  ?2. Spondylosis without myelopathy or radiculopathy, lumbar region  M47.816 XR C-ARM NO REPORT  ?  Facet Injection  ?  bupivacaine (MARCAINE) 0.5 % (with pres) injection 3 mL  ?  ?  ?Plan: No additional findings.  ? ?Meds & Orders:  ?Meds ordered this encounter  ?Medications  ? bupivacaine (MARCAINE) 0.5 % (with pres) injection 3 mL  ?  ?Orders Placed This Encounter  ?Procedures  ? Facet Injection  ? XR C-ARM NO REPORT  ?  ?Follow-up: Return for Review Pain Diary.  ? ?Procedures: ?No procedures performed  ?Lumbar Diagnostic Facet Joint Nerve Block with Fluoroscopic Guidance  ? ?Patient: Charles [name]      ?Date of Birth: [birth year] ?MRN: [id number] ?PCP: Celene Squibb, MD      ?Visit Date: 04/28/2021 ?  ?Universal Protocol:    ?Date/Time: 05/02/235:27 AM ? ?Consent Given By: the patient ? ?Position: PRONE ? ?Additional Comments: ?Vital signs were monitored before and after the  procedure. ?Patient was prepped and draped in the usual sterile fashion. ?The correct patient, procedure, and site was verified. ? ? ?Injection Procedure Details:  ? ?Procedure diagnoses:  ?1. Chronic bilateral low back pain without sciatica   ?2. Spondylosis without myelopathy or radiculopathy, lumbar region   ?  ? ?Meds Administered:  ?Meds ordered this encounter  ?Medications  ? bupivacaine (MARCAINE) 0.5 % (with pres) injection 3 mL  ?  ? ?Laterality: Bilateral ? ?Location/Site: L4-L5, L3 and L4 medial branches and L5-S1, L4 medial branch and L5 dorsal ramus ? ?Needle: 5.0 in., 25 ga.  Short bevel or Quincke spinal needle ? ?Needle Placement: Oblique pedical ? ?Findings: ? ? -Comments: There was excellent flow of contrast along the articular pillars without intravascular flow. ? ?Procedure Details: ?The fluoroscope beam is vertically oriented in AP and then obliqued 15 to 20 degrees to the ipsilateral side of the desired nerve to achieve the ?Scotty dog? appearance.  The skin over the target area of the junction of the superior articulating process and the transverse process (sacral ala if blocking the L5 dorsal rami) was locally anesthetized with a 1 ml volume of 1% Lidocaine without Epinephrine.  The spinal needle was inserted and advanced in a trajectory view down to the target.  ? ?After contact with periosteum and negative aspirate for blood and CSF, correct placement without intravascular or epidural spread  was confirmed by injecting 0.5 ml. of Isovue-250.  A spot radiograph was obtained of this image.   ? ?Next, a 0.5 ml. volume of the injectate described above was injected. The needle was then redirected to the other facet joint nerves mentioned above if needed. ? ?Prior to the procedure, the patient was given a Pain Diary which was completed for baseline measurements.  After the procedure, the patient rated their pain every 30 minutes and will continue rating at this frequency for a total of 5 hours.   The patient has been asked to complete the Diary and return to [place] by mail, fax or hand delivered as soon as possible. ? ? ?Additional Comments:  ?The patient tolerated the procedure well ?Dressing: 2 x 2 sterile gauze and Band-Aid ?  ? ?Post-procedure details: ?Patient was observed during the procedure. ?Post-procedure instructions were reviewed. ? ?Patient left the clinic in stable condition.   ? ?Clinical History: ?MRI LUMBAR SPINE WITHOUT CONTRAST  ?   ?TECHNIQUE:  ?Multiplanar, multisequence MR imaging of the lumbar spine was  ?performed. No intravenous contrast was administered.  ?   ?COMPARISON:  05/31/2017  ?   ?FINDINGS:  ?Segmentation:  5 lumbar type vertebrae  ?   ?Alignment: Reversal of lumbar lordosis with mild L5-S1  ?retrolisthesis.  ?   ?Vertebrae:  No fracture, evidence of discitis, or bone lesion.  ?   ?Conus medullaris and cauda equina: Conus extends to the L1 level.  ?Conus and cauda equina appear normal.  ?   ?Paraspinal and other soft tissues: Negative  ?   ?Disc levels:  ?   ?T12- L1: Unremarkable.  ?   ?L1-L2: Unremarkable.  ?   ?L2-L3: Disc narrowing and ventral spondylitic spurring. No  ?impingement  ?   ?L3-L4: Disc narrowing and mild bulging with ventral spondylitic  ?spurring. No impingement  ?   ?L4-L5: Disc narrowing and ventral spondylitic spurring. Right  ?preferential far-lateral disc bulging or protrusion which was  ?highlighted on prior. No neural compression.  ?   ?L5-S1:Disc narrowing and bulging with moderate bilateral foraminal  ?narrowing based on sagittal slices, slightly greater on the right.  ?Minor facet spurring. Widely patent spinal canal.  ?   ?IMPRESSION:  ?1. 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  ?2. Moderate bilateral foraminal narrowing at L5-S1.  ?3. Stable compared to 2019.  ?   ?   ?Electronically Signed  ?  By: Monte Fantasia M.D.  ?  On: 08/27/2019 13:07  ? ? ? ?Objective:  VS:  HT:    WT:   BMI:     BP:(!) 147/85  HR:76bpm   TEMP: ( )  RESP:  ?Physical Exam ?Vitals and nursing note reviewed.  ?Constitutional:   ?   General: He is not in acute distress. ?   Appearance: Normal appearance. He is not ill-appearing.  ?HENT:  ?   Head: Normocephalic and atraumatic.  ?   Right Ear: External ear normal.  ?   Left Ear: External ear normal.  ?   Nose: No congestion.  ?Eyes:  ?   Extraocular Movements: Extraocular movements intact.  ?Cardiovascular:  ?   Rate and Rhythm: Normal rate.  ?   Pulses: Normal pulses.  ?Pulmonary:  ?   Effort: Pulmonary effort is normal. No respiratory distress.  ?Abdominal:  ?   General: There is no distension.  ?   Palpations: Abdomen is soft.  ?Musculoskeletal:     ?  General: No tenderness or signs of injury.  ?   Cervical back: Neck supple.  ?   Right lower leg: No edema.  ?   Left lower leg: No edema.  ?   Comments: Patient has good distal strength without clonus. Patient somewhat slow to rise from a seated position to full extension.  There is concordant low back pain with facet loading and lumbar spine extension rotation.  There are no definitive trigger points but the patient is somewhat tender across the lower back and PSIS.  There is no pain with hip rotation.   ?Skin: ?   Findings: No erythema or rash.  ?Neurological:  ?   General: No focal deficit present.  ?   Mental Status: He is alert and oriented to person, place, and time.  ?   Sensory: No sensory deficit.  ?   Motor: No weakness or abnormal muscle tone.  ?   Coordination: Coordination normal.  ?Psychiatric:     ?   Mood and Affect: Mood normal.     ?   Behavior: Behavior normal.  ?  ? ?Imaging: ?No results found. ?

## 2021-05-13 ENCOUNTER — Encounter: Payer: Self-pay | Admitting: Gastroenterology

## 2021-05-13 ENCOUNTER — Ambulatory Visit: Payer: Managed Care, Other (non HMO) | Admitting: Gastroenterology

## 2021-05-13 VITALS — BP 120/72 | HR 86 | Temp 98.4°F | Ht 72.0 in | Wt 189.4 lb

## 2021-05-13 DIAGNOSIS — Z86010 Personal history of colonic polyps: Secondary | ICD-10-CM | POA: Insufficient documentation

## 2021-05-13 DIAGNOSIS — K219 Gastro-esophageal reflux disease without esophagitis: Secondary | ICD-10-CM

## 2021-05-13 MED ORDER — PANTOPRAZOLE SODIUM 40 MG PO TBEC: 40.0000 mg | DELAYED_RELEASE_TABLET | Freq: Every day | ORAL | 3 refills | Status: DC

## 2021-05-13 NOTE — Progress Notes (Signed)
? ? ?Gastroenterology Office Note   ? ? ?Primary Care Physician:  Celene Squibb, MD  ?Referring Physician: Dr. [name]  ?Primary Gastroenterologist: Dr. Gala Romney ? ? ?Chief Complaint  ? ?Chief Complaint  ?Patient presents with  ? Gastroesophageal Reflux  ? ? ? ?History of Present Illness  ? ?Charles [name] is a 51 y.o. male presenting today at request of Dr. [name] due to GERD. He was last seen in 2021 in this office. History of hemorrhoid banding in past. He was due for surveillance colonoscopy in Dec 2022 due to history of adenomas. EGD in 2020 with esophagitis and Schatzki's ring s/p dilation.  ? ?He states he has no issues with GERD and believes this visit is simply to arrange colonoscopy. Takes pantoprazole as needed. No dysphagia. No abdominal pain, N/V, changes in bowel habits, constipation, diarrhea, overt GI bleeding, unexplained weight loss, lack of appetite, unexplained weight gain.  ? ? ? ? ?Past Medical History:  ?Diagnosis Date  ? Arthritis   ? also has avascular necrosis   ? Chronic back pain   ? Chronic neck pain   ? Chronic shoulder pain   ? COPD (chronic obstructive pulmonary disease) ([place])   ? GERD (gastroesophageal reflux disease)   ? tums  OTC  medication  ? Headache   ? HTN (hypertension)   ? no longer  ? Hypothyroidism   ? Psoriatic arthritis ([place])   ? Sleep apnea   ? tested more than 5 yrs ago...negative results  ? ? ?Past Surgical History:  ?Procedure Laterality Date  ? COLONOSCOPY WITH PROPOFOL N/A 12/09/2015  ? four 5 mm polyps in sigmoid colon and descending colon. Pathology revealed tubular adenomas. Recommended repeat in 5 years.  ? ESOPHAGOGASTRODUODENOSCOPY (EGD) WITH PROPOFOL N/A 12/09/2015  ? Procedure: ESOPHAGOGASTRODUODENOSCOPY (EGD) WITH PROPOFOL;  Surgeon: Daneil Dolin, MD;  [facility];  Service: Endoscopy;  Laterality: N/A;  ? ESOPHAGOGASTRODUODENOSCOPY (EGD) WITH PROPOFOL N/A 06/09/2018  ? mild erosive reflux esophagitis, non-critical Schatzki's ring s/p dilation  ? JOINT  REPLACEMENT    ? left hip 2017  ? MALONEY DILATION N/A 06/09/2018  ? Procedure: MALONEY DILATION;  Surgeon: Daneil Dolin, MD;  [facility];  Service: Endoscopy;  Laterality: N/A;  ? NECK SURGERY    ? SHOULDER SURGERY    ? SUPERFICIAL PERONEAL NERVE RELEASE Right 08/19/2020  ? Procedure: right leg superficial peroneal nerve decompression, lateral compartment release;  Surgeon: Meredith Pel, MD;  Location: [facility];  Service: Orthopedics;  Laterality: Right;  ? TOTAL HIP ARTHROPLASTY Left 07/30/2015  ? Procedure: TOTAL HIP ARTHROPLASTY;  Surgeon: Garald Balding, MD;  Location: [facility];  Service: Orthopedics;  Laterality: Left;  ? TOTAL HIP ARTHROPLASTY Right 09/22/2016  ? TOTAL HIP ARTHROPLASTY Right 09/22/2016  ? Procedure: RIGHT TOTAL HIP ARTHROPLASTY;  Surgeon: Garald Balding, MD;  Location: [facility];  Service: Orthopedics;  Laterality: Right;  ? ? ?Current Outpatient Medications  ?Medication Sig Dispense Refill  ? albuterol (PROVENTIL HFA;VENTOLIN HFA) 108 (90 Base) MCG/ACT inhaler Inhale 1-2 puffs into the lungs every 4 (four) hours as needed for wheezing or shortness of breath.     ? amLODipine (NORVASC) 5 MG tablet Take 5 mg by mouth daily.    ? levothyroxine (SYNTHROID) 88 MCG tablet Take 88 mcg by mouth daily before breakfast.    ? olmesartan (BENICAR) 20 MG tablet Take 20 mg by mouth daily.    ? oxyCODONE-acetaminophen (PERCOCET) 10-325 MG tablet Take 1 tablet  by mouth every 4 (four) hours as needed for pain. 20 tablet 0  ? pantoprazole (PROTONIX) 40 MG tablet Take 1 tablet (40 mg total) by mouth daily. 30 minutes before breakfast 90 tablet 3  ? pantoprazole (PROTONIX) 40 MG tablet Take 40 mg by mouth daily. prn    ? ?No current facility-administered medications for this visit.  ? ? ?Allergies as of 05/13/2021  ? (No Known Allergies)  ? ? ?Family History  ?Problem Relation Age of Onset  ? Heart failure Mother   ? Diabetes Mother   ? Hypertension Mother   ? Heart failure Father   ? Prostate  cancer Father   ? Arthritis Father   ? Hypertension Sister   ? Heart attack Sister   ? Hypercholesterolemia Sister   ? Healthy Daughter   ? Colon cancer Neg Hx   ? Gastric cancer Neg Hx   ? Esophageal cancer Neg Hx   ? ? ?Social History  ? ?Socioeconomic History  ? Marital status: Divorced  ?  Spouse name: Not on file  ? Number of children: Not on file  ? Years of education: Not on file  ? Highest education level: Not on file  ?Occupational History  ? Not on file  ?Tobacco Use  ? Smoking status: Every Day  ?  Packs/day: 0.50  ?  Years: 25.00  ?  Pack years: 12.50  ?  Types: Cigarettes  ? Smokeless tobacco: Never  ?Vaping Use  ? Vaping Use: Former  ?Substance and Sexual Activity  ? Alcohol use: Yes  ?  Comment: social drinking  ? Drug use: No  ?  Comment: states quit  ? Sexual activity: Yes  ?Other Topics Concern  ? Not on file  ?Social History Narrative  ? Not on file  ? ?Social Determinants of Health  ? ?Financial Resource Strain: Not on file  ?Food Insecurity: Not on file  ?Transportation Needs: Not on file  ?Physical Activity: Not on file  ?Stress: Not on file  ?Social Connections: Not on file  ?Intimate Partner Violence: Not on file  ? ? ? ?Review of Systems  ? ?Gen: Denies any fever, chills, fatigue, weight loss, lack of appetite.  ?CV: Denies chest pain, heart palpitations, peripheral edema, syncope.  ?Resp: Denies shortness of breath at rest or with exertion. Denies wheezing or cough.  ?GI: see HPI ?GU : Denies urinary burning, urinary frequency, urinary hesitancy ?MS: Denies joint pain, muscle weakness, cramps, or limitation of movement.  ?Derm: Denies rash, itching, dry skin ?Psych: Denies depression, anxiety, memory loss, and confusion ?Heme: Denies bruising, bleeding, and enlarged lymph nodes. ? ? ?Physical Exam  ? ?BP 120/72 (BP Location: Right Arm, Patient Position: Sitting, Cuff Size: Normal)   Pulse 86   Temp 98.4 ?F (36.9 ?C) (Oral)   Ht 6' (1.829 m)   Wt 189 lb 6.4 oz (85.9 kg)   SpO2 96%    BMI 25.69 kg/m?  ?General:   Alert and oriented. Pleasant and cooperative. Well-nourished and well-developed.  ?Head:  Normocephalic and atraumatic. ?Eyes:  Without icterus ?Cardiac: S1 S2 present without murmurs ?Lungs: coarse, mild expiratory wheeze bilateral bases ?Abdomen:  +BS, soft, non-tender and non-distended. No HSM noted. No guarding or rebound. No masses appreciated.  ?Rectal:  Deferred  ?Msk:  Symmetrical without gross deformities. Normal posture. ?Extremities:  Without edema. ?Neurologic:  Alert and  oriented x4;  grossly normal neurologically. ?Skin:  Intact without significant lesions or rashes. ?Psych:  Alert and cooperative. Normal mood  and affect. ? ? ?Assessment  ? ?Charles [name] is a 51 y.o. male presenting today in follow-up with a history of chronic GERD, prior dilation of Schatzki's ring, and adenomas with surveillance due now. ? ?GERD: taking pantoprazole prn. With history of Schatzki's ring, recommend taking daily. Refilled this for him. No concerning signs/symptoms. ? ?History of adenomas: last colonoscopy 2017. No family history of colon cancer. No concerning lower GI signs/symptoms ? ? ?PLAN  ? ?Proceed with colonoscopy by Dr. Gala Romney in near future: the risks, benefits, and alternatives have been discussed with the patient in detail. The patient states understanding and desires to proceed. ?Pantoprazole daily: prescription provided ?Further recommendations to follow  ? ? ?Annitta Needs, PhD, ANP-BC ?[facility]  ? ? ?

## 2021-05-13 NOTE — Patient Instructions (Signed)
I recommend taking pantoprazole (Protonix) once daily, 30 minutes before breakfast. I sent this to your pharmacy. ? ?We are arranging a colonoscopy in the near future with Dr. Gala Romney! ? ?Good to see you again! ? ?I enjoyed seeing you again today! As you know, I value our relationship and want to provide genuine, compassionate, and quality care. I welcome your feedback. If you receive a survey regarding your visit,  I greatly appreciate you taking time to fill this out. See you next time! ? ?Annitta Needs, PhD, ANP-BC ?[facility]  ? ?

## 2021-05-16 ENCOUNTER — Telehealth: Payer: Self-pay

## 2021-05-16 ENCOUNTER — Other Ambulatory Visit: Payer: Self-pay

## 2021-05-16 MED ORDER — PEG 3350-KCL-NA BICARB-NACL 420 G PO SOLR: 4000.0000 mL | ORAL | 0 refills | Status: DC

## 2021-05-16 NOTE — Telephone Encounter (Signed)
Called pt, [facility]CS ASA 3 w/Dr. Gala Romney scheduled for 06/20/21 at 1:00pm. ?

## 2021-05-19 NOTE — Telephone Encounter (Signed)
Pre-op appt 06/18/21. Appt letter mailed with procedure instructions. ?

## 2021-06-10 ENCOUNTER — Telehealth: Payer: Self-pay | Admitting: Physical Medicine and Rehabilitation

## 2021-06-10 ENCOUNTER — Other Ambulatory Visit: Payer: Self-pay | Admitting: Physical Medicine and Rehabilitation

## 2021-06-10 DIAGNOSIS — M47816 Spondylosis without myelopathy or radiculopathy, lumbar region: Secondary | ICD-10-CM

## 2021-06-10 DIAGNOSIS — G8929 Other chronic pain: Secondary | ICD-10-CM

## 2021-06-10 NOTE — Telephone Encounter (Signed)
Patient called in stating in his back has been hurting severely and would like to set an appt

## 2021-06-16 NOTE — Patient Instructions (Signed)
Charles [name]  06/16/2021     '@PREFPERIOPPHARMACY'$ @   Your procedure is scheduled on  06/20/2021.   Report to [facility] at  1145  A.M.   Call this number if you have problems the morning of surgery:  [phone]   Remember:  Follow the diet and prep instructions given to you by the office.    Take these medicines the morning of surgery with A SIP OF WATER        amlodipine, levothyroxine, oxycodone(If needed), protonix.     Do not wear jewelry, make-up or nail polish.  Do not wear lotions, powders, or perfumes, or deodorant.  Do not shave 48 hours prior to surgery.  Men may shave face and neck.  Do not bring valuables to the hospital.  [facility] is not responsible for any belongings or valuables.  Contacts, dentures or bridgework may not be worn into surgery.  Leave your suitcase in the car.  After surgery it may be brought to your room.  For patients admitted to the hospital, discharge time will be determined by your treatment team.  Patients discharged the day of surgery will not be allowed to drive home and must have someone with them for 24 hours.    Special instructions:   DO NOT smoke tobacco or vape for 24 hours before your procedure.  Please read over the following fact sheets that you were given. Anesthesia Post-op Instructions and Care and Recovery After Surgery      Colonoscopy, Adult, Care After The following information offers guidance on how to care for yourself after your procedure. Your health care provider may also give you more specific instructions. If you have problems or questions, contact your health care provider. What can I expect after the procedure? After the procedure, it is common to have: A small amount of blood in your stool for 24 hours after the procedure. Some gas. Mild cramping or bloating of your abdomen. Follow these instructions at home: Eating and drinking  Drink enough fluid to keep your urine pale yellow. Follow  instructions from your health care provider about eating or drinking restrictions. Resume your normal diet as told by your health care provider. Avoid heavy or fried foods that are hard to digest. Activity Rest as told by your health care provider. Avoid sitting for a long time without moving. Get up to take short walks every 1-2 hours. This is important to improve blood flow and breathing. Ask for help if you feel weak or unsteady. Return to your normal activities as told by your health care provider. Ask your health care provider what activities are safe for you. Managing cramping and bloating  Try walking around when you have cramps or feel bloated. If directed, apply heat to your abdomen as told by your health care provider. Use the heat source that your health care provider recommends, such as a moist heat pack or a heating pad. Place a towel between your skin and the heat source. Leave the heat on for 20-30 minutes. Remove the heat if your skin turns bright red. This is especially important if you are unable to feel pain, heat, or cold. You have a greater risk of getting burned. General instructions If you were given a sedative during the procedure, it can affect you for several hours. Do not drive or operate machinery until your health care provider says that it is safe. For the first 24 hours after the procedure: Do  not sign important documents. Do not drink alcohol. Do your regular daily activities at a slower pace than normal. Eat soft foods that are easy to digest. Take over-the-counter and prescription medicines only as told by your health care provider. Keep all follow-up visits. This is important. Contact a health care provider if: You have blood in your stool 2-3 days after the procedure. Get help right away if: You have more than a small spotting of blood in your stool. You have large blood clots in your stool. You have swelling of your abdomen. You have nausea or  vomiting. You have a fever. You have increasing pain in your abdomen that is not relieved with medicine. These symptoms may be an emergency. Get help right away. Call 911. Do not wait to see if the symptoms will go away. Do not drive yourself to the hospital. Summary After the procedure, it is common to have a small amount of blood in your stool. You may also have mild cramping and bloating of your abdomen. If you were given a sedative during the procedure, it can affect you for several hours. Do not drive or operate machinery until your health care provider says that it is safe. Get help right away if you have a lot of blood in your stool, nausea or vomiting, a fever, or increased pain in your abdomen. This information is not intended to replace advice given to you by your health care provider. Make sure you discuss any questions you have with your health care provider. Document Revised: 08/14/2020 Document Reviewed: 08/14/2020 Elsevier Patient Education  [place] After This sheet gives you information about how to care for yourself after your procedure. Your health care provider may also give you more specific instructions. If you have problems or questions, contact your health care provider. What can I expect after the procedure? After the procedure, it is common to have: Tiredness. Forgetfulness about what happened after the procedure. Impaired judgment for important decisions. Nausea or vomiting. Some difficulty with balance. Follow these instructions at home: For the time period you were told by your health care provider:     Rest as needed. Do not participate in activities where you could fall or become injured. Do not drive or use machinery. Do not drink alcohol. Do not take sleeping pills or medicines that cause drowsiness. Do not make important decisions or sign legal documents. Do not take care of children on your own. Eating and  drinking Follow the diet that is recommended by your health care provider. Drink enough fluid to keep your urine pale yellow. If you vomit: Drink water, juice, or soup when you can drink without vomiting. Make sure you have little or no nausea before eating solid foods. General instructions Have a responsible adult stay with you for the time you are told. It is important to have someone help care for you until you are awake and alert. Take over-the-counter and prescription medicines only as told by your health care provider. If you have sleep apnea, surgery and certain medicines can increase your risk for breathing problems. Follow instructions from your health care provider about wearing your sleep device: Anytime you are sleeping, including during daytime naps. While taking prescription pain medicines, sleeping medicines, or medicines that make you drowsy. Avoid smoking. Keep all follow-up visits as told by your health care provider. This is important. Contact a health care provider if: You keep feeling nauseous or you keep vomiting. You feel  light-headed. You are still sleepy or having trouble with balance after 24 hours. You develop a rash. You have a fever. You have redness or swelling around the IV site. Get help right away if: You have trouble breathing. You have new-onset confusion at home. Summary For several hours after your procedure, you may feel tired. You may also be forgetful and have poor judgment. Have a responsible adult stay with you for the time you are told. It is important to have someone help care for you until you are awake and alert. Rest as told. Do not drive or operate machinery. Do not drink alcohol or take sleeping pills. Get help right away if you have trouble breathing, or if you suddenly become confused. This information is not intended to replace advice given to you by your health care provider. Make sure you discuss any questions you have with your  health care provider. Document Revised: 11/26/2020 Document Reviewed: 11/24/2018 Elsevier Patient Education  [place].

## 2021-06-17 ENCOUNTER — Other Ambulatory Visit: Payer: Self-pay | Admitting: *Deleted

## 2021-06-17 MED ORDER — PEG 3350-KCL-NA BICARB-NACL 420 G PO SOLR: ORAL | 0 refills | Status: DC

## 2021-06-18 ENCOUNTER — Encounter: Payer: Self-pay | Admitting: Physical Medicine and Rehabilitation

## 2021-06-18 ENCOUNTER — Ambulatory Visit: Payer: Self-pay

## 2021-06-18 ENCOUNTER — Encounter (HOSPITAL_COMMUNITY)
Admission: RE | Admit: 2021-06-18 | Discharge: 2021-06-18 | Disposition: A | Discharge status: Home / Self Care | Payer: Managed Care, Other (non HMO) | Source: Ambulatory Visit | Attending: Internal Medicine | Admitting: Internal Medicine

## 2021-06-18 ENCOUNTER — Ambulatory Visit (INDEPENDENT_AMBULATORY_CARE_PROVIDER_SITE_OTHER): Payer: Managed Care, Other (non HMO) | Admitting: Physical Medicine and Rehabilitation

## 2021-06-18 VITALS — BP 131/78 | HR 77

## 2021-06-18 DIAGNOSIS — M47816 Spondylosis without myelopathy or radiculopathy, lumbar region: Secondary | ICD-10-CM | POA: Diagnosis not present

## 2021-06-18 MED ORDER — BUPIVACAINE HCL 0.5 % IJ SOLN
3.0000 mL | Freq: Once | INTRAMUSCULAR | Status: AC
  Administered 2021-06-18: 3 mL

## 2021-06-18 NOTE — Patient Instructions (Signed)

## 2021-06-18 NOTE — Progress Notes (Unsigned)
Pt state lower back pain that travels down both legs, mostly his right . Pt state walking and standing makes the pain worse. Pt state he takes pain meds and uses his and back brace to help ease his pain.  Numeric Pain Rating Scale and Functional Assessment Average Pain 7   In the last MONTH (on 0-10 scale) has pain interfered with the following?  1. General activity like being  able to carry out your everyday physical activities such as walking, climbing stairs, carrying groceries, or moving a chair?  Rating(10)   +Driver, -BT, -Dye Allergies.

## 2021-06-19 ENCOUNTER — Other Ambulatory Visit: Payer: Self-pay | Admitting: Physical Medicine and Rehabilitation

## 2021-06-19 DIAGNOSIS — M545 Low back pain, unspecified: Secondary | ICD-10-CM

## 2021-06-19 DIAGNOSIS — M47816 Spondylosis without myelopathy or radiculopathy, lumbar region: Secondary | ICD-10-CM

## 2021-06-19 NOTE — Progress Notes (Unsigned)
100% pain relief per pain diary with recent bilateral L4-L5 and L5-S1 facet joint/medial branch blocks. We will proceed with radiofrequency ablation procedure.

## 2021-06-20 ENCOUNTER — Ambulatory Visit (HOSPITAL_BASED_OUTPATIENT_CLINIC_OR_DEPARTMENT_OTHER): Payer: Managed Care, Other (non HMO) | Admitting: Anesthesiology

## 2021-06-20 ENCOUNTER — Ambulatory Visit (HOSPITAL_COMMUNITY)
Admission: RE | Admit: 2021-06-20 | Discharge: 2021-06-20 | Disposition: A | Discharge status: Home / Self Care | Payer: Managed Care, Other (non HMO) | Attending: Internal Medicine | Admitting: Internal Medicine

## 2021-06-20 ENCOUNTER — Encounter (HOSPITAL_COMMUNITY)
Admission: RE | Disposition: A | Discharge status: Home / Self Care | Payer: Self-pay | Source: Home / Self Care | Attending: Internal Medicine

## 2021-06-20 ENCOUNTER — Encounter (HOSPITAL_COMMUNITY): Payer: Self-pay | Admitting: Internal Medicine

## 2021-06-20 ENCOUNTER — Other Ambulatory Visit: Payer: Self-pay

## 2021-06-20 ENCOUNTER — Ambulatory Visit (HOSPITAL_COMMUNITY): Payer: Managed Care, Other (non HMO) | Admitting: Anesthesiology

## 2021-06-20 DIAGNOSIS — K635 Polyp of colon: Secondary | ICD-10-CM | POA: Diagnosis not present

## 2021-06-20 DIAGNOSIS — Z09 Encounter for follow-up examination after completed treatment for conditions other than malignant neoplasm: Secondary | ICD-10-CM

## 2021-06-20 DIAGNOSIS — I10 Essential (primary) hypertension: Secondary | ICD-10-CM | POA: Diagnosis not present

## 2021-06-20 DIAGNOSIS — E039 Hypothyroidism, unspecified: Secondary | ICD-10-CM | POA: Diagnosis not present

## 2021-06-20 DIAGNOSIS — J449 Chronic obstructive pulmonary disease, unspecified: Secondary | ICD-10-CM | POA: Insufficient documentation

## 2021-06-20 DIAGNOSIS — D122 Benign neoplasm of ascending colon: Secondary | ICD-10-CM

## 2021-06-20 DIAGNOSIS — Z1211 Encounter for screening for malignant neoplasm of colon: Secondary | ICD-10-CM | POA: Insufficient documentation

## 2021-06-20 DIAGNOSIS — Z86010 Personal history of colonic polyps: Secondary | ICD-10-CM

## 2021-06-20 DIAGNOSIS — K219 Gastro-esophageal reflux disease without esophagitis: Secondary | ICD-10-CM | POA: Insufficient documentation

## 2021-06-20 DIAGNOSIS — D125 Benign neoplasm of sigmoid colon: Secondary | ICD-10-CM | POA: Insufficient documentation

## 2021-06-20 DIAGNOSIS — Z79899 Other long term (current) drug therapy: Secondary | ICD-10-CM | POA: Diagnosis not present

## 2021-06-20 DIAGNOSIS — K648 Other hemorrhoids: Secondary | ICD-10-CM | POA: Insufficient documentation

## 2021-06-20 DIAGNOSIS — F17210 Nicotine dependence, cigarettes, uncomplicated: Secondary | ICD-10-CM | POA: Insufficient documentation

## 2021-06-20 SURGERY — COLONOSCOPY WITH PROPOFOL
Anesthesia: General

## 2021-06-20 MED ORDER — STERILE WATER FOR IRRIGATION IR SOLN
Status: DC | PRN
  Administered 2021-06-20: 50 mL

## 2021-06-20 MED ORDER — DEXMEDETOMIDINE (PRECEDEX) IN NS 20 MCG/5ML (4 MCG/ML) IV SYRINGE
PREFILLED_SYRINGE | INTRAVENOUS | Status: DC | PRN
  Administered 2021-06-20: 8 ug via INTRAVENOUS
  Administered 2021-06-20: 12 ug via INTRAVENOUS

## 2021-06-20 MED ORDER — PROPOFOL 10 MG/ML IV BOLUS
INTRAVENOUS | Status: DC | PRN
  Administered 2021-06-20 (×2): 50 mg via INTRAVENOUS
  Administered 2021-06-20: 100 mg via INTRAVENOUS

## 2021-06-20 MED ORDER — LIDOCAINE HCL (CARDIAC) PF 100 MG/5ML IV SOSY
PREFILLED_SYRINGE | INTRAVENOUS | Status: DC | PRN
  Administered 2021-06-20: 50 mg via INTRAVENOUS

## 2021-06-20 MED ORDER — DEXMEDETOMIDINE HCL IN NACL 80 MCG/20ML IV SOLN
INTRAVENOUS | Status: AC
  Filled 2021-06-20: qty 20

## 2021-06-20 MED ORDER — PROPOFOL 500 MG/50ML IV EMUL
INTRAVENOUS | Status: DC | PRN
  Administered 2021-06-20: 150 ug/kg/min via INTRAVENOUS

## 2021-06-20 MED ORDER — LACTATED RINGERS IV SOLN: INTRAVENOUS | Status: DC

## 2021-06-20 NOTE — Anesthesia Postprocedure Evaluation (Signed)
Anesthesia Post Note  Patient: Charles [name]  Procedure(s) Performed: COLONOSCOPY WITH PROPOFOL POLYPECTOMY  Patient location during evaluation: Phase II Anesthesia Type: General Level of consciousness: awake Pain management: pain level controlled Vital Signs Assessment: post-procedure vital signs reviewed and stable Respiratory status: spontaneous breathing and respiratory function stable Cardiovascular status: blood pressure returned to baseline and stable Postop Assessment: no headache and no apparent nausea or vomiting Anesthetic complications: no Comments: Late entry   No notable events documented.   Last Vitals:  Vitals:   06/20/21 1203 06/20/21 1324  BP: 119/75 101/60  Pulse: 73 72  Resp: 16 20  Temp: 36.7 C 36.4 C  SpO2: 98% 93%    Last Pain:  Vitals:   06/20/21 1324  TempSrc: Axillary  PainSc: 0-No pain                 Louann Sjogren

## 2021-06-20 NOTE — H&P (Signed)
$'@LOGO'M$ @   Primary Care Physician:  Celene Squibb, MD Primary Gastroenterologist:  Dr. Gala Romney  Pre-Procedure History & Physical: HPI:  Charles [name] is a 51 y.o. male here for surveillance colonoscopy.  History of multiple colonic adenomas removed in his colon in 2017; no lower GI symptoms at this time.  Past Medical History:  Diagnosis Date   Arthritis    also has avascular necrosis    Chronic back pain    Chronic neck pain    Chronic shoulder pain    COPD (chronic obstructive pulmonary disease) (HCC)    GERD (gastroesophageal reflux disease)    tums  OTC  medication   Headache    HTN (hypertension)    no longer   Hypothyroidism    Psoriatic arthritis ([place])    Sleep apnea    tested more than 5 yrs ago...negative results    Past Surgical History:  Procedure Laterality Date   COLONOSCOPY WITH PROPOFOL N/A 12/09/2015   four 5 mm polyps in sigmoid colon and descending colon. Pathology revealed tubular adenomas. Recommended repeat in 5 years.   ESOPHAGOGASTRODUODENOSCOPY (EGD) WITH PROPOFOL N/A 12/09/2015   Procedure: ESOPHAGOGASTRODUODENOSCOPY (EGD) WITH PROPOFOL;  Surgeon: Daneil Dolin, MD;  [facility]: [facility];  Service: Endoscopy;  Laterality: N/A;   ESOPHAGOGASTRODUODENOSCOPY (EGD) WITH PROPOFOL N/A 06/09/2018   mild erosive reflux esophagitis, non-critical Schatzki's ring s/p dilation   JOINT REPLACEMENT     left hip 2017   MALONEY DILATION N/A 06/09/2018   Procedure: MALONEY DILATION;  Surgeon: Daneil Dolin, MD;  Location: [facility];  Service: Endoscopy;  Laterality: N/A;   NECK SURGERY     SHOULDER SURGERY     SUPERFICIAL PERONEAL NERVE RELEASE Right 08/19/2020   Procedure: right leg superficial peroneal nerve decompression, lateral compartment release;  Surgeon: Meredith Pel, MD;  Location: [place];  Service: Orthopedics;  Laterality: Right;   TOTAL HIP ARTHROPLASTY Left 07/30/2015   Procedure: TOTAL HIP ARTHROPLASTY;  Surgeon: Garald Balding, MD;  Location:  [place];  Service: Orthopedics;  Laterality: Left;   TOTAL HIP ARTHROPLASTY Right 09/22/2016   TOTAL HIP ARTHROPLASTY Right 09/22/2016   Procedure: RIGHT TOTAL HIP ARTHROPLASTY;  Surgeon: Garald Balding, MD;  Location: [place];  Service: Orthopedics;  Laterality: Right;    Prior to Admission medications   Medication Sig Start Date End Date Taking? Authorizing Provider  albuterol (PROVENTIL HFA;VENTOLIN HFA) 108 (90 Base) MCG/ACT inhaler Inhale 1-2 puffs into the lungs every 4 (four) hours as needed for wheezing or shortness of breath.  12/21/17  Yes [provider]  amLODipine (NORVASC) 10 MG tablet Take 10 mg by mouth daily. 01/25/21  Yes [provider]  levothyroxine (SYNTHROID) 88 MCG tablet Take 88 mcg by mouth daily before breakfast. 06/25/20  Yes [provider]  olmesartan (BENICAR) 40 MG tablet Take 40 mg by mouth daily. 01/11/21  Yes [provider]  oxyCODONE-acetaminophen (PERCOCET) 10-325 MG tablet Take 1 tablet by mouth every 4 (four) hours as needed for pain. 08/19/20  Yes Magnant, Charles L, PA-C  pantoprazole (PROTONIX) 40 MG tablet Take 1 tablet (40 mg total) by mouth daily. 30 minutes before breakfast Patient taking differently: Take 40 mg by mouth daily as needed (acid reflux). 30 minutes before breakfast 05/13/21  Yes Annitta Needs, NP  polyethylene glycol-electrolytes (NULYTELY) 420 g solution As directed 06/17/21   Navarre Diana, Cristopher Estimable, MD  polyethylene glycol-electrolytes (TRILYTE) 420 g solution Take 4,000 mLs by mouth as  directed. 05/16/21   Daneil Dolin, MD    Allergies as of 05/16/2021   (No Known Allergies)    Family History  Problem Relation Age of Onset   Heart failure Mother    Diabetes Mother    Hypertension Mother    Heart failure Father    Prostate cancer Father    Arthritis Father    Hypertension Sister    Heart attack Sister    Hypercholesterolemia Sister    Healthy Daughter    Colon cancer Neg Hx    Gastric cancer Neg  Hx    Esophageal cancer Neg Hx     Social History   Socioeconomic History   Marital status: Divorced    Spouse name: Not on file   Number of children: Not on file   Years of education: Not on file   Highest education level: Not on file  Occupational History   Not on file  Tobacco Use   Smoking status: Every Day    Packs/day: 0.50    Years: 25.00    Total pack years: 12.50    Types: Cigarettes   Smokeless tobacco: Never  Vaping Use   Vaping Use: Former  Substance and Sexual Activity   Alcohol use: Yes    Comment: social drinking   Drug use: No    Comment: states quit   Sexual activity: Yes  Other Topics Concern   Not on file  Social History Narrative   Not on file   Social Determinants of Health   Financial Resource Strain: Not on file  Food Insecurity: Not on file  Transportation Needs: Not on file  Physical Activity: Not on file  Stress: Not on file  Social Connections: Not on file  Intimate Partner Violence: Not on file    Review of Systems: See HPI, otherwise negative ROS  Physical Exam: BP 119/75   Pulse 73   Temp 98.1 F (36.7 C) (Oral)   Resp 16   SpO2 98%  General:   Alert,  Well-developed, well-nourished, pleasant and cooperative in NAD Nose:  No deformity, discharge,  or lesions. Mouth:  No deformity or lesions. Neck:  Supple; no masses or thyromegaly. No significant cervical adenopathy. Lungs:  Clear throughout to auscultation.   No wheezes, crackles, or rhonchi. No acute distress. Heart:  Regular rate and rhythm; no murmurs, clicks, rubs,  or gallops. Abdomen: Non-distended, normal bowel sounds.  Soft and nontender without appreciable mass or hepatosplenomegaly.  Pulses:  Normal pulses noted. Extremities:  Without clubbing or edema.  Impression/Plan: 51 year old gentleman here for surveillance colonoscopy.  Interim history multiple colonic adenomas removed 2017; no bowel symptoms at this time.  I have offered the patient a surveillance  colonoscopy today.  The risks, benefits, limitations, alternatives and imponderables have been reviewed with the patient. Questions have been answered. All parties are agreeable.       Notice: This dictation was prepared with Dragon dictation along with smaller phrase technology. Any transcriptional errors that result from this process are unintentional and may not be corrected upon review.

## 2021-06-20 NOTE — Op Note (Signed)
[facility] Patient Name: Charles [name] Procedure Date: 06/20/2021 12:53 PM MRN: [id number] Date of Birth: 08-Jun-1970 Attending MD: Norvel Richards , MD CSN: [id number] Age: 51 Admit Type: Outpatient Procedure:                Colonoscopy Indications:              High risk colon cancer surveillance: Personal                            history of colonic polyps Providers:                Norvel Richards, MD, Charlsie Quest. Theda Sers RN, RN,                            Raphael Gibney, Technician Referring MD:              Medicines:                Propofol per Anesthesia Complications:            No immediate complications. Estimated Blood Loss:     Estimated blood loss was minimal. Procedure:                Pre-Anesthesia Assessment:                           - Prior to the procedure, a History and Physical                            was performed, and patient medications and                            allergies were reviewed. The patient's tolerance of                            previous anesthesia was also reviewed. The risks                            and benefits of the procedure and the sedation                            options and risks were discussed with the patient.                            All questions were answered, and informed consent                            was obtained. Prior Anticoagulants: The patient has                            taken no previous anticoagulant or antiplatelet                            agents. ASA Grade Assessment: III - A patient with  severe systemic disease. After reviewing the risks                            and benefits, the patient was deemed in                            satisfactory condition to undergo the procedure.                           After obtaining informed consent, the colonoscope                            was passed under direct vision. Throughout the                            procedure,  the patient's blood pressure, pulse, and                            oxygen saturations were monitored continuously. The                            [id number]) scope was introduced through the                            anus and advanced to the the cecum, identified by                            appendiceal orifice and ileocecal valve. The                            colonoscopy was performed without difficulty. The                            patient tolerated the procedure well. The quality                            of the bowel preparation was adequate. Scope In: 1:03:49 PM Scope Out: 1:21:04 PM Scope Withdrawal Time: 0 hours 13 minutes 40 seconds  Total Procedure Duration: 0 hours 17 minutes 15 seconds  Findings:      The perianal and digital rectal examinations were normal.      Five sessile polyps were found in the sigmoid colon and ascending colon.       The polyps were 4 to 6 mm in size. These polyps were removed with a cold       snare. Resection and retrieval were complete. Estimated blood loss was       minimal.      Minimal internal hemorrhoids and anal papilla.      The exam was otherwise without abnormality on direct and retroflexion       views. Impression:               - Five 4 to 6 mm polyps in the sigmoid colon and in  the ascending colon, removed with a cold snare.                            Resected and retrieved.                           - The examination was otherwise normal on direct                            and retroflexion views. Moderate Sedation:      Moderate (conscious) sedation was personally administered by an       anesthesia professional. The following parameters were monitored: oxygen       saturation, heart rate, blood pressure, respiratory rate, EKG, adequacy       of pulmonary ventilation, and response to care. Recommendation:           - Patient has a contact number available for                             emergencies. The signs and symptoms of potential                            delayed complications were discussed with the                            patient. Return to normal activities tomorrow.                            Written discharge instructions were provided to the                            patient.                           - Resume previous diet.                           - Continue present medications.                           - Repeat colonoscopy date to be determined after                            pending pathology results are reviewed for                            surveillance.                           - Return to GI office (date not yet determined). Procedure Code(s):        --- Professional ---                           [phone], Colonoscopy, flexible; with removal of  tumor(s), polyp(s), or other lesion(s) by snare                            technique Diagnosis Code(s):        --- Professional ---                           Z86.010, Personal history of colonic polyps                           K63.5, Polyp of colon CPT copyright 2019 [employer]. All rights reserved. The codes documented in this report are preliminary and upon coder review may  be revised to meet current compliance requirements. Cristopher Estimable. Curly Mackowski, MD Norvel Richards, MD 06/20/2021 1:41:29 PM This report has been signed electronically. Number of Addenda: 0

## 2021-06-20 NOTE — Discharge Instructions (Signed)
  Colonoscopy Discharge Instructions  Read the instructions outlined below and refer to this sheet in the next few weeks. These discharge instructions provide you with general information on caring for yourself after you leave the hospital. Your doctor may also give you specific instructions. While your treatment has been planned according to the most current medical practices available, unavoidable complications occasionally occur. If you have any problems or questions after discharge, call Dr. Gala Romney at [phone]. ACTIVITY You may resume your regular activity, but move at a slower pace for the next 24 hours.  Take frequent rest periods for the next 24 hours.  Walking will help get rid of the air and reduce the bloated feeling in your belly (abdomen).  No driving for 24 hours (because of the medicine (anesthesia) used during the test).   Do not sign any important legal documents or operate any machinery for 24 hours (because of the anesthesia used during the test).  NUTRITION Drink plenty of fluids.  You may resume your normal diet as instructed by your doctor.  Begin with a light meal and progress to your normal diet. Heavy or fried foods are harder to digest and may make you feel sick to your stomach (nauseated).  Avoid alcoholic beverages for 24 hours or as instructed.  MEDICATIONS You may resume your normal medications unless your doctor tells you otherwise.  WHAT YOU CAN EXPECT TODAY Some feelings of bloating in the abdomen.  Passage of more gas than usual.  Spotting of blood in your stool or on the toilet paper.  IF YOU HAD POLYPS REMOVED DURING THE COLONOSCOPY: No aspirin products for 7 days or as instructed.  No alcohol for 7 days or as instructed.  Eat a soft diet for the next 24 hours.  FINDING OUT THE RESULTS OF YOUR TEST Not all test results are available during your visit. If your test results are not back during the visit, make an appointment with your caregiver to find out the  results. Do not assume everything is normal if you have not heard from your caregiver or the medical facility. It is important for you to follow up on all of your test results.  SEEK IMMEDIATE MEDICAL ATTENTION IF: You have more than a spotting of blood in your stool.  Your belly is swollen (abdominal distention).  You are nauseated or vomiting.  You have a temperature over 101.  You have abdominal pain or discomfort that is severe or gets worse throughout the day.    5 small polyps removed from your colon today  Further recommendations to follow pending review of pathology report  At patient request, I called Waldo Laine at [phone] findings and recommendations

## 2021-06-20 NOTE — Anesthesia Preprocedure Evaluation (Signed)
Anesthesia Evaluation  Patient identified by MRN, date of birth, ID band Patient awake    Reviewed: Allergy & Precautions, H&P , NPO status , Patient's Chart, lab work & pertinent test results, reviewed documented beta blocker date and time   Airway Mallampati: II  TM Distance: >3 FB Neck ROM: full    Dental no notable dental hx.    Pulmonary COPD, Current Smoker,    Pulmonary exam normal breath sounds clear to auscultation       Cardiovascular Exercise Tolerance: Good hypertension, negative cardio ROS   Rhythm:regular Rate:Normal     Neuro/Psych  Headaches,  Neuromuscular disease negative psych ROS   GI/Hepatic Neg liver ROS, GERD  Medicated,  Endo/Other  Hypothyroidism   Renal/GU negative Renal ROS  negative genitourinary   Musculoskeletal   Abdominal   Peds  Hematology negative hematology ROS (+)   Anesthesia Other Findings   Reproductive/Obstetrics negative OB ROS                             Anesthesia Physical Anesthesia Plan  ASA: 2  Anesthesia Plan: General   Post-op Pain Management:    Induction:   PONV Risk Score and Plan: Propofol infusion  Airway Management Planned:   Additional Equipment:   Intra-op Plan:   Post-operative Plan:   Informed Consent: I have reviewed the patients History and Physical, chart, labs and discussed the procedure including the risks, benefits and alternatives for the proposed anesthesia with the patient or authorized representative who has indicated his/her understanding and acceptance.     Dental Advisory Given  Plan Discussed with: CRNA  Anesthesia Plan Comments:         Anesthesia Quick Evaluation

## 2021-06-20 NOTE — Anesthesia Procedure Notes (Signed)
Date/Time: 06/20/2021 1:14 PM  Performed by: Orlie Dakin, CRNAPre-anesthesia Checklist: Patient identified, Emergency Drugs available, Suction available and Patient being monitored Patient Re-evaluated:Patient Re-evaluated prior to induction Oxygen Delivery Method: Nasal cannula Induction Type: IV induction Placement Confirmation: positive ETCO2

## 2021-06-20 NOTE — Transfer of Care (Signed)
Immediate Anesthesia Transfer of Care Note  Patient: Charles [name]  Procedure(s) Performed: COLONOSCOPY WITH PROPOFOL POLYPECTOMY  Patient Location: Short Stay  Anesthesia Type:General  Level of Consciousness: drowsy  Airway & Oxygen Therapy: Patient Spontanous Breathing  Post-op Assessment: Report given to RN and Post -op Vital signs reviewed and stable  Post vital signs: Reviewed and stable  Last Vitals:  Vitals Value Taken Time  BP    Temp    Pulse    Resp    SpO2      Last Pain:  Vitals:   06/20/21 1300  TempSrc:   PainSc: 0-No pain         Complications: No notable events documented.

## 2021-06-23 ENCOUNTER — Encounter: Payer: Self-pay | Admitting: Internal Medicine

## 2021-06-23 LAB — SURGICAL PATHOLOGY

## 2021-06-26 ENCOUNTER — Encounter (HOSPITAL_COMMUNITY): Payer: Self-pay | Admitting: Internal Medicine

## 2021-07-22 ENCOUNTER — Ambulatory Visit: Payer: Self-pay

## 2021-07-22 ENCOUNTER — Ambulatory Visit (INDEPENDENT_AMBULATORY_CARE_PROVIDER_SITE_OTHER): Payer: Managed Care, Other (non HMO) | Admitting: Physical Medicine and Rehabilitation

## 2021-07-22 ENCOUNTER — Encounter: Payer: Self-pay | Admitting: Physical Medicine and Rehabilitation

## 2021-07-22 VITALS — BP 112/84 | HR 87

## 2021-07-22 DIAGNOSIS — M47816 Spondylosis without myelopathy or radiculopathy, lumbar region: Secondary | ICD-10-CM

## 2021-07-22 MED ORDER — METHYLPREDNISOLONE ACETATE 80 MG/ML IJ SUSP
80.0000 mg | Freq: Once | INTRAMUSCULAR | Status: AC
  Administered 2021-07-22: 80 mg

## 2021-07-22 NOTE — Progress Notes (Signed)
Pt state lower back pain, mostly his right . Pt state walking and standing makes the pain worse. Pt state he takes pain meds and uses his and back brace to help ease his pain.  Numeric Pain Rating Scale and Functional Assessment Average Pain 7   In the last MONTH (on 0-10 scale) has pain interfered with the following?  1. General activity like being  able to carry out your everyday physical activities such as walking, climbing stairs, carrying groceries, or moving a chair?  Rating(10)   +Driver, -BT, -Dye Allergies.

## 2021-07-22 NOTE — Patient Instructions (Signed)

## 2021-07-29 ENCOUNTER — Encounter: Payer: Self-pay | Admitting: Physical Medicine and Rehabilitation

## 2021-07-29 ENCOUNTER — Ambulatory Visit: Payer: Self-pay

## 2021-07-29 ENCOUNTER — Ambulatory Visit (INDEPENDENT_AMBULATORY_CARE_PROVIDER_SITE_OTHER): Payer: Managed Care, Other (non HMO) | Admitting: Physical Medicine and Rehabilitation

## 2021-07-29 DIAGNOSIS — M47816 Spondylosis without myelopathy or radiculopathy, lumbar region: Secondary | ICD-10-CM | POA: Diagnosis not present

## 2021-07-29 MED ORDER — METHYLPREDNISOLONE ACETATE 80 MG/ML IJ SUSP: 80.0000 mg | Freq: Once | INTRAMUSCULAR | Status: DC

## 2021-07-29 NOTE — Progress Notes (Signed)
Charles [name] - 51 y.o. male MRN [id number]  Date of birth: Apr 11, 1970  Office Visit Note: Visit Date: 07/22/2021 PCP: Celene Squibb, MD Referred by: Celene Squibb, MD  Subjective: Chief Complaint  Patient presents with   Lower Back - Pain   HPI:  Charles [name] is a 51 y.o. male who comes in todayfor planned radiofrequency ablation of the Right L4-5 and L5-S1 Lumbar facet joints. This would be ablation of the corresponding medial branches and/or dorsal rami.  Patient has had double diagnostic blocks with more than 50% relief.  These are documented on pain diary.  They have had chronic back pain for quite some time, more than 3 months, which has been an ongoing situation with recalcitrant axial back pain.  They have no radicular pain.  Their axial pain is worse with standing and ambulating and on exam today with facet loading.  They have had physical therapy as well as home exercise program.  The imaging noted in the chart below indicated facet pathology. Accordingly they meet all the criteria and qualification for for radiofrequency ablation and we are going to complete this today hopefully for more longer term relief as part of comprehensive management program.   ROS Otherwise per HPI.  Assessment & Plan: Visit Diagnoses:    ICD-10-CM   1. Spondylosis without myelopathy or radiculopathy, lumbar region  M47.816 XR C-ARM NO REPORT    Radiofrequency,Lumbar    methylPREDNISolone acetate (DEPO-MEDROL) injection 80 mg      Plan: No additional findings.   Meds & Orders:  Meds ordered this encounter  Medications   methylPREDNISolone acetate (DEPO-MEDROL) injection 80 mg    Orders Placed This Encounter  Procedures   Radiofrequency,Lumbar   XR C-ARM NO REPORT    Follow-up: Return if symptoms worsen or fail to improve.   Procedures: No procedures performed  Lumbar Facet Joint Nerve Denervation  Patient: Charles [name]      Date of Birth: [birth year] MRN: [id number] PCP: Celene Squibb,  MD      Visit Date: 07/22/2021   Universal Protocol:    Date/Time: 07/25/231:19 PM  Consent Given By: the patient  Position: PRONE  Additional Comments: Vital signs were monitored before and after the procedure. Patient was prepped and draped in the usual sterile fashion. The correct patient, procedure, and site was verified.   Injection Procedure Details:   Procedure diagnoses:  1. Spondylosis without myelopathy or radiculopathy, lumbar region      Meds Administered:  Meds ordered this encounter  Medications   methylPREDNISolone acetate (DEPO-MEDROL) injection 80 mg     Laterality: Right  Location/Site:  L4-L5, L3 and L4 medial branches and L5-S1, L4 medial branch and L5 dorsal ramus  Needle: 18 ga.,  39m active tip, 10859mRF Cannula  Needle Placement: Along juncture of superior articular process and transverse pocess  Findings:  -Comments:  Procedure Details: For each desired target nerve, the corresponding transverse process (sacral ala for the L5 dorsal rami) was identified and the fluoroscope was positioned to square off the endplates of the corresponding vertebral body to achieve a true AP midline view.  The beam was then obliqued 15 to 20 degrees and caudally tilted 15 to 20 degrees to line up a trajectory along the target nerves. The skin over the target of the junction of superior articulating process and transverse process (sacral ala for the L5 dorsal rami) was infiltrated with 59m259mf 1% Lidocaine without Epinephrine.  The 18 gauge  34m active tip outer cannula was advanced in trajectory view to the target.  This procedure was repeated for each target nerve.  Then, for all levels, the outer cannula placement was fine-tuned and the position was then confirmed with bi-planar imaging.    Test stimulation was done both at sensory and motor levels to ensure there was no radicular stimulation. The target tissues were then infiltrated with 1 ml of 1% Lidocaine without  Epinephrine. Subsequently, a percutaneous neurotomy was carried out for 90 seconds at 80 degrees Celsius.  After the completion of the lesion, 1 ml of injectate was delivered. It was then repeated for each facet joint nerve mentioned above. Appropriate radiographs were obtained to verify the probe placement during the neurotomy.   Additional Comments:  The patient tolerated the procedure well Dressing: 2 x 2 sterile gauze and Band-Aid    Post-procedure details: Patient was observed during the procedure. Post-procedure instructions were reviewed.  Patient left the clinic in stable condition.       Clinical History: MRI LUMBAR SPINE WITHOUT CONTRAST     TECHNIQUE:  Multiplanar, multisequence MR imaging of the lumbar spine was  performed. No intravenous contrast was administered.     COMPARISON:  05/31/2017     FINDINGS:  Segmentation:  5 lumbar type vertebrae     Alignment: Reversal of lumbar lordosis with mild L5-S1  retrolisthesis.     Vertebrae:  No fracture, evidence of discitis, or bone lesion.     Conus medullaris and cauda equina: Conus extends to the L1 level.  Conus and cauda equina appear normal.     Paraspinal and other soft tissues: Negative     Disc levels:     T12- L1: Unremarkable.     L1-L2: Unremarkable.     L2-L3: Disc narrowing and ventral spondylitic spurring. No  impingement     L3-L4: Disc narrowing and mild bulging with ventral spondylitic  spurring. No impingement     L4-L5: Disc narrowing and ventral spondylitic spurring. Right  preferential far-lateral disc bulging or protrusion which was  highlighted on prior. No neural compression.     L5-S1:Disc narrowing and bulging with moderate bilateral foraminal  narrowing based on sagittal slices, slightly greater on the right.  Minor facet spurring. Widely patent spinal canal.     IMPRESSION:  1. 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  2. Moderate bilateral  foraminal narrowing at L5-S1.  3. Stable compared to 2019.        Electronically Signed    By: JMonte FantasiaM.D.    On: 08/27/2019 13:07     Objective:  VS:  HT:    WT:   BMI:     BP:112/84  HR:87bpm  TEMP: ( )  RESP:  Physical Exam Vitals and nursing note reviewed.  Constitutional:      General: He is not in acute distress.    Appearance: Normal appearance. He is not ill-appearing.  HENT:     Head: Normocephalic and atraumatic.     Right Ear: External ear normal.     Left Ear: External ear normal.     Nose: No congestion.  Eyes:     Extraocular Movements: Extraocular movements intact.  Cardiovascular:     Rate and Rhythm: Normal rate.     Pulses: Normal pulses.  Pulmonary:     Effort: Pulmonary effort is normal. No respiratory distress.  Abdominal:     General: There is no distension.  Palpations: Abdomen is soft.  Musculoskeletal:        General: No tenderness or signs of injury.     Cervical back: Neck supple.     Right lower leg: No edema.     Left lower leg: No edema.     Comments: Patient has good distal strength without clonus.  Skin:    Findings: No erythema or rash.  Neurological:     General: No focal deficit present.     Mental Status: He is alert and oriented to person, place, and time.     Sensory: No sensory deficit.     Motor: No weakness or abnormal muscle tone.     Coordination: Coordination normal.  Psychiatric:        Mood and Affect: Mood normal.        Behavior: Behavior normal.      Imaging: No results found.

## 2021-07-29 NOTE — Procedures (Signed)
Lumbar Facet Joint Nerve Denervation  Patient: Charles [name]      Date of Birth: 02/25/1970 MRN: [id number] PCP: Celene Squibb, MD      Visit Date: 07/22/2021   Universal Protocol:    Date/Time: 07/25/231:19 PM  Consent Given By: the patient  Position: PRONE  Additional Comments: Vital signs were monitored before and after the procedure. Patient was prepped and draped in the usual sterile fashion. The correct patient, procedure, and site was verified.   Injection Procedure Details:   Procedure diagnoses:  1. Spondylosis without myelopathy or radiculopathy, lumbar region      Meds Administered:  Meds ordered this encounter  Medications   methylPREDNISolone acetate (DEPO-MEDROL) injection 80 mg     Laterality: Right  Location/Site:  L4-L5, L3 and L4 medial branches and L5-S1, L4 medial branch and L5 dorsal ramus  Needle: 18 ga.,  37m active tip, 1030mRF Cannula  Needle Placement: Along juncture of superior articular process and transverse pocess  Findings:  -Comments:  Procedure Details: For each desired target nerve, the corresponding transverse process (sacral ala for the L5 dorsal rami) was identified and the fluoroscope was positioned to square off the endplates of the corresponding vertebral body to achieve a true AP midline view.  The beam was then obliqued 15 to 20 degrees and caudally tilted 15 to 20 degrees to line up a trajectory along the target nerves. The skin over the target of the junction of superior articulating process and transverse process (sacral ala for the L5 dorsal rami) was infiltrated with 53m48mf 1% Lidocaine without Epinephrine.  The 18 gauge 87m53mtive tip outer cannula was advanced in trajectory view to the target.  This procedure was repeated for each target nerve.  Then, for all levels, the outer cannula placement was fine-tuned and the position was then confirmed with bi-planar imaging.    Test stimulation was done both at sensory and  motor levels to ensure there was no radicular stimulation. The target tissues were then infiltrated with 1 ml of 1% Lidocaine without Epinephrine. Subsequently, a percutaneous neurotomy was carried out for 90 seconds at 80 degrees Celsius.  After the completion of the lesion, 1 ml of injectate was delivered. It was then repeated for each facet joint nerve mentioned above. Appropriate radiographs were obtained to verify the probe placement during the neurotomy.   Additional Comments:  The patient tolerated the procedure well Dressing: 2 x 2 sterile gauze and Band-Aid    Post-procedure details: Patient was observed during the procedure. Post-procedure instructions were reviewed.  Patient left the clinic in stable condition.

## 2021-07-29 NOTE — Progress Notes (Signed)
Pt state lower back pain, mostly his left today. Pt state walking and standing makes the pain worse. Pt state he takes pain meds and uses his and back brace to help ease his pain.  Numeric Pain Rating Scale and Functional Assessment Average Pain 7   In the last MONTH (on 0-10 scale) has pain interfered with the following?  1. General activity like being  able to carry out your everyday physical activities such as walking, climbing stairs, carrying groceries, or moving a chair?  Rating(10)   +Driver, -BT, -Dye Allergies.

## 2021-07-29 NOTE — Patient Instructions (Signed)

## 2021-07-30 NOTE — Procedures (Signed)
Lumbar Facet Joint Nerve Denervation  Patient: Charles [name]      Date of Birth: 1970-03-29 MRN: [id number] PCP: Celene Squibb, MD      Visit Date: 07/29/2021   Universal Protocol:    Date/Time: 07/26/238:40 AM  Consent Given By: the patient  Position: PRONE  Additional Comments: Vital signs were monitored before and after the procedure. Patient was prepped and draped in the usual sterile fashion. The correct patient, procedure, and site was verified.   Injection Procedure Details:   Procedure diagnoses:  1. Spondylosis without myelopathy or radiculopathy, lumbar region      Meds Administered:  Meds ordered this encounter  Medications   methylPREDNISolone acetate (DEPO-MEDROL) injection 80 mg     Laterality: Left  Location/Site:  L4-L5, L3 and L4 medial branches and L5-S1, L4 medial branch and L5 dorsal ramus  Needle: 18 ga.,  46m active tip, 1024mRF Cannula  Needle Placement: Along juncture of superior articular process and transverse pocess  Findings:  -Comments:  Procedure Details: For each desired target nerve, the corresponding transverse process (sacral ala for the L5 dorsal rami) was identified and the fluoroscope was positioned to square off the endplates of the corresponding vertebral body to achieve a true AP midline view.  The beam was then obliqued 15 to 20 degrees and caudally tilted 15 to 20 degrees to line up a trajectory along the target nerves. The skin over the target of the junction of superior articulating process and transverse process (sacral ala for the L5 dorsal rami) was infiltrated with 66m62mf 1% Lidocaine without Epinephrine.  The 18 gauge 47m59mtive tip outer cannula was advanced in trajectory view to the target.  This procedure was repeated for each target nerve.  Then, for all levels, the outer cannula placement was fine-tuned and the position was then confirmed with bi-planar imaging.    Test stimulation was done both at sensory and  motor levels to ensure there was no radicular stimulation. The target tissues were then infiltrated with 1 ml of 1% Lidocaine without Epinephrine. Subsequently, a percutaneous neurotomy was carried out for 90 seconds at 80 degrees Celsius.  After the completion of the lesion, 1 ml of injectate was delivered. It was then repeated for each facet joint nerve mentioned above. Appropriate radiographs were obtained to verify the probe placement during the neurotomy.   Additional Comments:  The patient tolerated the procedure well Dressing: 2 x 2 sterile gauze and Band-Aid    Post-procedure details: Patient was observed during the procedure. Post-procedure instructions were reviewed.  Patient left the clinic in stable condition.

## 2021-07-30 NOTE — Progress Notes (Signed)
Charles [name] - 51 y.o. male MRN [id number]  Date of birth: 11-Aug-1970  Office Visit Note: Visit Date: 07/29/2021 PCP: Celene Squibb, MD Referred by: Celene Squibb, MD  Subjective: Chief Complaint  Patient presents with   Lower Back - Pain   HPI:  Charles [name] is a 51 y.o. male who comes in todayfor planned radiofrequency ablation of the Left L4-5 and L5-S1 Lumbar facet joints. This would be ablation of the corresponding medial branches and/or dorsal rami.  Patient has had double diagnostic blocks with more than 50% relief.  These are documented on pain diary.  They have had chronic back pain for quite some time, more than 3 months, which has been an ongoing situation with recalcitrant axial back pain.  They have no radicular pain.  Their axial pain is worse with standing and ambulating and on exam today with facet loading.  They have had physical therapy as well as home exercise program.  The imaging noted in the chart below indicated facet pathology. Accordingly they meet all the criteria and qualification for for radiofrequency ablation and we are going to complete this today hopefully for more longer term relief as part of comprehensive management program.   ROS Otherwise per HPI.  Assessment & Plan: Visit Diagnoses:    ICD-10-CM   1. Spondylosis without myelopathy or radiculopathy, lumbar region  M47.816 XR C-ARM NO REPORT    methylPREDNISolone acetate (DEPO-MEDROL) injection 80 mg    Radiofrequency,Lumbar    CANCELED: Epidural Steroid injection      Plan: No additional findings.   Meds & Orders:  Meds ordered this encounter  Medications   methylPREDNISolone acetate (DEPO-MEDROL) injection 80 mg    Orders Placed This Encounter  Procedures   Radiofrequency,Lumbar   XR C-ARM NO REPORT    Follow-up: Return for visit to requesting provider as needed.   Procedures: No procedures performed  Lumbar Facet Joint Nerve Denervation  Patient: Charles [name]      Date of Birth:  February 20, 1970 MRN: [id number] PCP: Celene Squibb, MD      Visit Date: 07/29/2021   Universal Protocol:    Date/Time: 07/26/238:40 AM  Consent Given By: the patient  Position: PRONE  Additional Comments: Vital signs were monitored before and after the procedure. Patient was prepped and draped in the usual sterile fashion. The correct patient, procedure, and site was verified.   Injection Procedure Details:   Procedure diagnoses:  1. Spondylosis without myelopathy or radiculopathy, lumbar region      Meds Administered:  Meds ordered this encounter  Medications   methylPREDNISolone acetate (DEPO-MEDROL) injection 80 mg     Laterality: Left  Location/Site:  L4-L5, L3 and L4 medial branches and L5-S1, L4 medial branch and L5 dorsal ramus  Needle: 18 ga.,  93m active tip, 1027mRF Cannula  Needle Placement: Along juncture of superior articular process and transverse pocess  Findings:  -Comments:  Procedure Details: For each desired target nerve, the corresponding transverse process (sacral ala for the L5 dorsal rami) was identified and the fluoroscope was positioned to square off the endplates of the corresponding vertebral body to achieve a true AP midline view.  The beam was then obliqued 15 to 20 degrees and caudally tilted 15 to 20 degrees to line up a trajectory along the target nerves. The skin over the target of the junction of superior articulating process and transverse process (sacral ala for the L5 dorsal rami) was infiltrated with 61m70mf 1%  Lidocaine without Epinephrine.  The 18 gauge 61m active tip outer cannula was advanced in trajectory view to the target.  This procedure was repeated for each target nerve.  Then, for all levels, the outer cannula placement was fine-tuned and the position was then confirmed with bi-planar imaging.    Test stimulation was done both at sensory and motor levels to ensure there was no radicular stimulation. The target tissues were then  infiltrated with 1 ml of 1% Lidocaine without Epinephrine. Subsequently, a percutaneous neurotomy was carried out for 90 seconds at 80 degrees Celsius.  After the completion of the lesion, 1 ml of injectate was delivered. It was then repeated for each facet joint nerve mentioned above. Appropriate radiographs were obtained to verify the probe placement during the neurotomy.   Additional Comments:  The patient tolerated the procedure well Dressing: 2 x 2 sterile gauze and Band-Aid    Post-procedure details: Patient was observed during the procedure. Post-procedure instructions were reviewed.  Patient left the clinic in stable condition.       Clinical History: MRI LUMBAR SPINE WITHOUT CONTRAST     TECHNIQUE:  Multiplanar, multisequence MR imaging of the lumbar spine was  performed. No intravenous contrast was administered.     COMPARISON:  05/31/2017     FINDINGS:  Segmentation:  5 lumbar type vertebrae     Alignment: Reversal of lumbar lordosis with mild L5-S1  retrolisthesis.     Vertebrae:  No fracture, evidence of discitis, or bone lesion.     Conus medullaris and cauda equina: Conus extends to the L1 level.  Conus and cauda equina appear normal.     Paraspinal and other soft tissues: Negative     Disc levels:     T12- L1: Unremarkable.     L1-L2: Unremarkable.     L2-L3: Disc narrowing and ventral spondylitic spurring. No  impingement     L3-L4: Disc narrowing and mild bulging with ventral spondylitic  spurring. No impingement     L4-L5: Disc narrowing and ventral spondylitic spurring. Right  preferential far-lateral disc bulging or protrusion which was  highlighted on prior. No neural compression.     L5-S1:Disc narrowing and bulging with moderate bilateral foraminal  narrowing based on sagittal slices, slightly greater on the right.  Minor facet spurring. Widely patent spinal canal.     IMPRESSION:  1. Disc degeneration at L2-3 to L5-S1 with reversed  lumbar lordosis.  2. Moderate bilateral foraminal narrowing at L5-S1.  3. Stable compared to 2019.        Electronically Signed    By: JMonte FantasiaM.D.    On: 08/27/2019 13:07     Objective:  VS:  HT:    WT:   BMI:     BP:   HR: bpm  TEMP: ( )  RESP:  Physical Exam Vitals and nursing note reviewed.  Constitutional:      General: He is not in acute distress.    Appearance: Normal appearance. He is not ill-appearing.  HENT:     Head: Normocephalic and atraumatic.     Right Ear: External ear normal.     Left Ear: External ear normal.     Nose: No congestion.  Eyes:     Extraocular Movements: Extraocular movements intact.  Cardiovascular:     Rate and Rhythm: Normal rate.     Pulses: Normal pulses.  Pulmonary:     Effort: Pulmonary effort is normal. No respiratory distress.  Abdominal:  General: There is no distension.     Palpations: Abdomen is soft.  Musculoskeletal:        General: No tenderness or signs of injury.     Cervical back: Neck supple.     Right lower leg: No edema.     Left lower leg: No edema.     Comments: Patient has good distal strength without clonus.  Skin:    Findings: No erythema or rash.  Neurological:     General: No focal deficit present.     Mental Status: He is alert and oriented to person, place, and time.     Sensory: No sensory deficit.     Motor: No weakness or abnormal muscle tone.     Coordination: Coordination normal.  Psychiatric:        Mood and Affect: Mood normal.        Behavior: Behavior normal.      Imaging: XR C-ARM NO REPORT  Result Date: 07/29/2021 Please see Notes tab for imaging impression.

## 2021-10-03 ENCOUNTER — Ambulatory Visit: Payer: Managed Care, Other (non HMO) | Admitting: Surgical

## 2021-10-03 DIAGNOSIS — M47816 Spondylosis without myelopathy or radiculopathy, lumbar region: Secondary | ICD-10-CM

## 2021-10-03 MED ORDER — GABAPENTIN 300 MG PO CAPS: 300.0000 mg | ORAL_CAPSULE | Freq: Three times a day (TID) | ORAL | 1 refills | Status: DC

## 2021-10-10 ENCOUNTER — Encounter: Payer: Self-pay | Admitting: Surgical

## 2021-10-10 NOTE — Progress Notes (Signed)
Office Visit Note   Patient: Charles [name]           Date of Birth: 12/24/1970           MRN: [id number] Visit Date: 10/03/2021 Requested by: Celene Squibb, MD [place],  [place] [address] PCP: Celene Squibb, MD  Subjective: Chief Complaint  Patient presents with   Right Leg - Pain    HPI: Charles [name] is a 51 y.o. male who presents to the office complaining of right leg pain.  Patient states that he has had increased pain in his right leg over the last month and feels his symptoms are worsening.Marland Kitchen He has history of prior SPN release on the right leg.  Worsening pain over the last month.  Describes constant and burning pain.  Taking oxycodone for pain without much relief.  Also complains of increased LBP.  Has 30 year smoking history.  No red flag back symptoms.   .                ROS: All systems reviewed are negative as they relate to the chief complaint within the history of present illness.  Patient denies fevers or chills.  Assessment & Plan: Visit Diagnoses:  1. Spondylosis without myelopathy or radiculopathy, lumbar region     Plan: Patient is 51 y.o. male who returns to the office with worsening burning quality pain that is suspicious for neuropathic pain or referred pain from L-spine.  Has history of MRI L-spine from 08/2019 demonstrating far lateral disc bulging of L4-L5 which may be progressed or may be more symptomatic for Charles [name] recently.  Plan for evaluation of possible progression with ne wMRI and follow-up to review results. Other problems on the differential include renewed compression of SPN or arterial claudication that could contribute to calf pain.    Follow-Up Instructions: No follow-ups on file.   Orders:  Orders Placed This Encounter  Procedures   MR Lumbar Spine w/o contrast   Meds ordered this encounter  Medications   gabapentin (NEURONTIN) 300 MG capsule    Sig: Take 1 capsule (300 mg total) by mouth 3 (three) times daily.    Dispense:  90  capsule    Refill:  1      Procedures: No procedures performed   Clinical Data: No additional findings.  Objective: Vital Signs: There were no vitals taken for this visit.  Physical Exam:  Constitutional: Patient appears well-developed HEENT:  Head: Normocephalic Eyes:EOM are normal Neck: Normal range of motion Cardiovascular: Normal rate Pulmonary/chest: Effort normal Neurologic: Patient is alert Skin: Skin is warm Psychiatric: Patient has normal mood and affect  Ortho Exam: Ortho exam demonstrates palpable DP and PT pulses of the right lower extremity.  5/5 motor strength of DF/PF, quad, ham, hip flexion bilaterally.  TTP throughout axial lumbar spine around L4 or L5.  Positvie straight leg raise on right, neg on left.  Well-healed incision overlying the lateral calf without evidence of infection or dehiscence.    Specialty Comments:  MRI LUMBAR SPINE WITHOUT CONTRAST     TECHNIQUE:  Multiplanar, multisequence MR imaging of the lumbar spine was  performed. No intravenous contrast was administered.     COMPARISON:  05/31/2017     FINDINGS:  Segmentation:  5 lumbar type vertebrae     Alignment: Reversal of lumbar lordosis with mild L5-S1  retrolisthesis.     Vertebrae:  No fracture, evidence of discitis, or bone lesion.  Conus medullaris and cauda equina: Conus extends to the L1 level.  Conus and cauda equina appear normal.     Paraspinal and other soft tissues: Negative     Disc levels:     T12- L1: Unremarkable.     L1-L2: Unremarkable.     L2-L3: Disc narrowing and ventral spondylitic spurring. No  impingement     L3-L4: Disc narrowing and mild bulging with ventral spondylitic  spurring. No impingement     L4-L5: Disc narrowing and ventral spondylitic spurring. Right  preferential far-lateral disc bulging or protrusion which was  highlighted on prior. No neural compression.     L5-S1:Disc narrowing and bulging with moderate bilateral foraminal   narrowing based on sagittal slices, slightly greater on the right.  Minor facet spurring. Widely patent spinal canal.     IMPRESSION:  1. 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  2. Moderate bilateral foraminal narrowing at L5-S1.  3. Stable compared to 2019.        Electronically Signed    By: Monte Fantasia M.D.    On: 08/27/2019 13:07  Imaging: No results found.   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    Pain in right leg 08/31/2019   Grade I hemorrhoids 03/03/2019   GERD (gastroesophageal reflux disease) 05/25/2018   Dysphagia 05/25/2018   Acute left-sided low back pain with left-sided sciatica 02/01/2018   Pain in left hip 02/01/2018   Constipation 05/11/2017   Low back pain 04/05/2017   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   Bilateral sacroiliitis ([place]) 05/22/2016   Psoriatic arthritis ([facility]) 05/22/2016   High risk medications (not anticoagulants) long-term use 05/22/2016   DJD (degenerative joint disease), cervical 05/22/2016   Osteoarthritis of spine with radiculopathy, lumbar region 05/22/2016   Cephalalgia 03/16/2016   Chronic daily headache 03/16/2016   Neck pain 03/16/2016   Hemorrhoids 03/13/2016   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   Insomnia 12/26/2015   Abdominal pain, epigastric 11/13/2015   Dark stools 11/13/2015   Rectal bleeding 11/13/2015   Numbness 09/26/2015   Neuropathic pain involving left lateral femoral cutaneous nerve 09/26/2015   Avascular necrosis of left femoral head ([facility]) 07/30/2015   Avascular necrosis of right femoral head ([facility]) 07/30/2015   Psoriasis 07/30/2015   Chronic obstructive pulmonary disease (COPD) ([facility]) 07/30/2015   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   Past Medical History:  Diagnosis Date    Arthritis    also has avascular necrosis    Chronic back pain    Chronic neck pain    Chronic shoulder pain    COPD (chronic obstructive pulmonary disease) (HCC)    GERD (gastroesophageal reflux disease)    tums  OTC  medication   Headache    HTN (hypertension)    no longer   Hypothyroidism    Psoriatic arthritis ([facility])    Sleep apnea    tested more than 5 yrs ago...negative results    Family History  Problem Relation Age of Onset   Heart failure Mother    Diabetes Mother    Hypertension Mother    Heart failure Father    Prostate cancer Father    Arthritis Father    Hypertension Sister    Heart attack Sister    Hypercholesterolemia Sister    Healthy Daughter  Colon cancer Neg Hx    Gastric cancer Neg Hx    Esophageal cancer Neg Hx     Past Surgical History:  Procedure Laterality Date   COLONOSCOPY WITH PROPOFOL N/A 12/09/2015   four 5 mm polyps in sigmoid colon and descending colon. Pathology revealed tubular adenomas. Recommended repeat in 5 years.   COLONOSCOPY WITH PROPOFOL N/A 06/20/2021   Procedure: COLONOSCOPY WITH PROPOFOL;  Surgeon: Daneil Dolin, MD;  [facility];  Service: Endoscopy;  Laterality: N/A;  1:00pm   ESOPHAGOGASTRODUODENOSCOPY (EGD) WITH PROPOFOL N/A 12/09/2015   Procedure: ESOPHAGOGASTRODUODENOSCOPY (EGD) WITH PROPOFOL;  Surgeon: Daneil Dolin, MD;  [facility];  Service: Endoscopy;  Laterality: N/A;   ESOPHAGOGASTRODUODENOSCOPY (EGD) WITH PROPOFOL N/A 06/09/2018   mild erosive reflux esophagitis, non-critical Schatzki's ring s/p dilation   JOINT REPLACEMENT     left hip 2017   MALONEY DILATION N/A 06/09/2018   Procedure: MALONEY DILATION;  Surgeon: Daneil Dolin, MD;  [facility];  Service: Endoscopy;  Laterality: N/A;   NECK SURGERY     POLYPECTOMY  06/20/2021   Procedure: POLYPECTOMY;  Surgeon: Daneil Dolin, MD;  [facility];  Service: Endoscopy;;   SHOULDER SURGERY     SUPERFICIAL PERONEAL  NERVE RELEASE Right 08/19/2020   Procedure: right leg superficial peroneal nerve decompression, lateral compartment release;  Surgeon: Meredith Pel, MD;  Location: [facility];  Service: Orthopedics;  Laterality: Right;   TOTAL HIP ARTHROPLASTY Left 07/30/2015   Procedure: TOTAL HIP ARTHROPLASTY;  Surgeon: Garald Balding, MD;  Location: [facility];  Service: Orthopedics;  Laterality: Left;   TOTAL HIP ARTHROPLASTY Right 09/22/2016   TOTAL HIP ARTHROPLASTY Right 09/22/2016   Procedure: RIGHT TOTAL HIP ARTHROPLASTY;  Surgeon: Garald Balding, MD;  Location: [facility];  Service: Orthopedics;  Laterality: Right;   Social History   Occupational History   Not on file  Tobacco Use   Smoking status: Every Day    Packs/day: 0.50    Years: 25.00    Total pack years: 12.50    Types: Cigarettes   Smokeless tobacco: Never  Vaping Use   Vaping Use: Former  Substance and Sexual Activity   Alcohol use: Yes    Comment: social drinking   Drug use: No    Comment: states quit   Sexual activity: Yes

## 2021-10-19 ENCOUNTER — Ambulatory Visit
Admission: RE | Admit: 2021-10-19 | Discharge: 2021-10-19 | Disposition: A | Discharge status: Home / Self Care | Payer: Managed Care, Other (non HMO) | Source: Ambulatory Visit | Attending: Surgical | Admitting: Surgical

## 2021-10-19 DIAGNOSIS — M47816 Spondylosis without myelopathy or radiculopathy, lumbar region: Secondary | ICD-10-CM

## 2021-10-31 ENCOUNTER — Ambulatory Visit: Payer: Managed Care, Other (non HMO) | Admitting: Surgical

## 2021-10-31 ENCOUNTER — Encounter: Payer: Self-pay | Admitting: Surgical

## 2021-10-31 DIAGNOSIS — M47816 Spondylosis without myelopathy or radiculopathy, lumbar region: Secondary | ICD-10-CM | POA: Diagnosis not present

## 2021-10-31 NOTE — Progress Notes (Signed)
Office Visit Note   Patient: Charles [name]           Date of Birth: June 05, 1970           MRN: [id number] Visit Date: 10/31/2021 Requested by: Celene Squibb, MD [place],  [place] [address] PCP: Celene Squibb, MD  Subjective: Chief Complaint  Patient presents with   Other     Scan review    HPI: SUMIT BRANHAM is a 51 y.o. male who presents to the office for MRI review. Patient denies any changes in symptoms.  Continues to complain mainly of low back pain that he localizes to the right side of the low back.  He has occasional radiation of pain down the posterior aspect of the right leg that extends to the distal posterior thigh.  Most of his right leg pain is burning sensation in the lateral aspect of the right calf that stops around the ankle or top of the foot.  Does not have any pain on the plantar aspect of the foot.  Back pain and leg pain bother him equally.  MRI results revealed: MR Lumbar Spine w/o contrast  Result Date: 10/21/2021 CLINICAL DATA:  Low back pain with right leg weakness for 4 months EXAM: MRI LUMBAR SPINE WITHOUT CONTRAST TECHNIQUE: Multiplanar, multisequence MR imaging of the lumbar spine was performed. No intravenous contrast was administered. COMPARISON:  MRI 10/27/2019 FINDINGS: Segmentation:  Standard. Alignment: Straightening with slight reversal of the lumbar lordosis. Trace retrolisthesis of L5 on S1. Vertebrae:  No fracture, evidence of discitis, or bone lesion. Conus medullaris and cauda equina: Conus extends to the L1 level. Conus and cauda equina appear normal. Paraspinal and other soft tissues: Negative. Disc levels: T12-L1: Unremarkable. L1-L2: Unremarkable. L2-L3: Minimal annular disc bulge. No foraminal or canal stenosis. Unchanged. L3-L4: Minimal annular disc bulge. No foraminal or canal stenosis. Unchanged. L4-L5: Mild annular disc bulge and endplate osteophytic spurring. Minimal facet hypertrophy. Mild-moderate right and mild left  foraminal stenosis. No canal stenosis. No significant interval progression from prior. L5-S1: Mild annular disc bulge with endplate osteophytic spurring. Mild bilateral facet hypertrophy. Moderate bilateral foraminal stenosis. No canal stenosis. No significant interval progression from prior. IMPRESSION: 1. Mild degenerative changes of the lumbar spine, not significantly progressed from prior study. 2. 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 3. Mild-moderate right and mild left foraminal stenosis at L4-L5. 4. No canal stenosis at any level. Electronically Signed   By: Davina Poke D.O.   On: 10/21/2021 10:39                 ROS: All systems reviewed are negative as they relate to the chief complaint within the history of present illness.  Patient denies fevers or chills.  Assessment & Plan: Visit Diagnoses:  1. Spondylosis without myelopathy or radiculopathy, lumbar region     Plan: GREEN QUINCY is a 51 y.o. male who presents to the office for review of lumbar spine MRI.  MRI demonstrates disc narrowing at multiple levels with far lateral disc bulging on the right side at L4-L5 along with moderate bilateral foraminal narrowing at L5-S1.  Plan for L-spine ESI with Dr. Ernestina Patches to see if there is any contribution of his lateral calf pain from the L4-L5 foraminal stenosis.  Patient was instructed to pay close attention to how his symptoms improve over the first week to 2 weeks out from the injection.  He will follow-up with Dr. Marlou Sa after injection and  if he has no relief, he may need further work-up of potential recurrence of peroneal nerve entrapment.  Patient agreed with plan.  He does not take any blood thinners.  Follow-Up Instructions: No follow-ups on file.   Orders:  Orders Placed This Encounter  Procedures   Ambulatory referral to Physical Medicine Rehab   No orders of the defined types were placed in this encounter.     Procedures: No procedures performed   Clinical  Data: No additional findings.  Objective: Vital Signs: There were no vitals taken for this visit.  Physical Exam:  Constitutional: Patient appears well-developed HEENT:  Head: Normocephalic Eyes:EOM are normal Neck: Normal range of motion Cardiovascular: Normal rate Pulmonary/chest: Effort normal Neurologic: Patient is alert Skin: Skin is warm Psychiatric: Patient has normal mood and affect  Ortho Exam: Ortho exam demonstrates 5/5 motor strength of bilateral hip flexion, quadricep, dorsiflexion, plantarflexion.  5 -/5 hamstring strength of the right lower extremity relative to 5/5 of the left lower extremity.  Negative straight leg raise bilaterally.  No clonus on either side.  No pain with hip range of motion.  Specialty Comments:  MRI LUMBAR SPINE WITHOUT CONTRAST     TECHNIQUE:  Multiplanar, multisequence MR imaging of the lumbar spine was  performed. No intravenous contrast was administered.     COMPARISON:  05/31/2017     FINDINGS:  Segmentation:  5 lumbar type vertebrae     Alignment: Reversal of lumbar lordosis with mild L5-S1  retrolisthesis.     Vertebrae:  No fracture, evidence of discitis, or bone lesion.     Conus medullaris and cauda equina: Conus extends to the L1 level.  Conus and cauda equina appear normal.     Paraspinal and other soft tissues: Negative     Disc levels:     T12- L1: Unremarkable.     L1-L2: Unremarkable.     L2-L3: Disc narrowing and ventral spondylitic spurring. No  impingement     L3-L4: Disc narrowing and mild bulging with ventral spondylitic  spurring. No impingement     L4-L5: Disc narrowing and ventral spondylitic spurring. Right  preferential far-lateral disc bulging or protrusion which was  highlighted on prior. No neural compression.     L5-S1:Disc narrowing and bulging with moderate bilateral foraminal  narrowing based on sagittal slices, slightly greater on the right.  Minor facet spurring. Widely patent spinal  canal.     IMPRESSION:  1. 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  2. Moderate bilateral foraminal narrowing at L5-S1.  3. Stable compared to 2019.        Electronically Signed    By: Monte Fantasia M.D.    On: 08/27/2019 13:07  Imaging: No results found.   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    Pain in right leg 08/31/2019   Grade I hemorrhoids 03/03/2019   GERD (gastroesophageal reflux disease) 05/25/2018   Dysphagia 05/25/2018   Acute left-sided low back pain with left-sided sciatica 02/01/2018   Pain in left hip 02/01/2018   Constipation 05/11/2017   Low back pain 04/05/2017   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   Bilateral sacroiliitis ([facility]) 05/22/2016   Psoriatic arthritis ([facility]) 05/22/2016   High risk medications (not anticoagulants) long-term use 05/22/2016   DJD (degenerative joint disease), cervical 05/22/2016   Osteoarthritis of spine with  radiculopathy, lumbar region 05/22/2016   Cephalalgia 03/16/2016   Chronic daily headache 03/16/2016   Neck pain 03/16/2016   Hemorrhoids 03/13/2016   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   Insomnia 12/26/2015   Abdominal pain, epigastric 11/13/2015   Dark stools 11/13/2015   Rectal bleeding 11/13/2015   Numbness 09/26/2015   Neuropathic pain involving left lateral femoral cutaneous nerve 09/26/2015   Avascular necrosis of left femoral head ([facility]) 07/30/2015   Avascular necrosis of right femoral head ([facility]) 07/30/2015   Psoriasis 07/30/2015   Chronic obstructive pulmonary disease (COPD) ([facility]) 07/30/2015   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   Past Medical History:  Diagnosis Date   Arthritis    also has avascular necrosis    Chronic back pain    Chronic neck pain    Chronic shoulder pain    COPD  (chronic obstructive pulmonary disease) (HCC)    GERD (gastroesophageal reflux disease)    tums  OTC  medication   Headache    HTN (hypertension)    no longer   Hypothyroidism    Psoriatic arthritis ([facility])    Sleep apnea    tested more than 5 yrs ago...negative results    Family History  Problem Relation Age of Onset   Heart failure Mother    Diabetes Mother    Hypertension Mother    Heart failure Father    Prostate cancer Father    Arthritis Father    Hypertension Sister    Heart attack Sister    Hypercholesterolemia Sister    Healthy Daughter    Colon cancer Neg Hx    Gastric cancer Neg Hx    Esophageal cancer Neg Hx     Past Surgical History:  Procedure Laterality Date   COLONOSCOPY WITH PROPOFOL N/A 12/09/2015   four 5 mm polyps in sigmoid colon and descending colon. Pathology revealed tubular adenomas. Recommended repeat in 5 years.   COLONOSCOPY WITH PROPOFOL N/A 06/20/2021   Procedure: COLONOSCOPY WITH PROPOFOL;  Surgeon: Daneil Dolin, MD;  [facility];  Service: Endoscopy;  Laterality: N/A;  1:00pm   ESOPHAGOGASTRODUODENOSCOPY (EGD) WITH PROPOFOL N/A 12/09/2015   Procedure: ESOPHAGOGASTRODUODENOSCOPY (EGD) WITH PROPOFOL;  Surgeon: Daneil Dolin, MD;  [facility];  Service: Endoscopy;  Laterality: N/A;   ESOPHAGOGASTRODUODENOSCOPY (EGD) WITH PROPOFOL N/A 06/09/2018   mild erosive reflux esophagitis, non-critical Schatzki's ring s/p dilation   JOINT REPLACEMENT     left hip 2017   MALONEY DILATION N/A 06/09/2018   Procedure: MALONEY DILATION;  Surgeon: Daneil Dolin, MD;  [facility];  Service: Endoscopy;  Laterality: N/A;   NECK SURGERY     POLYPECTOMY  06/20/2021   Procedure: POLYPECTOMY;  Surgeon: Daneil Dolin, MD;  [facility];  Service: Endoscopy;;   SHOULDER SURGERY     SUPERFICIAL PERONEAL NERVE RELEASE Right 08/19/2020   Procedure: right leg superficial peroneal nerve decompression, lateral compartment release;   Surgeon: Meredith Pel, MD;  Location: [facility];  Service: Orthopedics;  Laterality: Right;   TOTAL HIP ARTHROPLASTY Left 07/30/2015   Procedure: TOTAL HIP ARTHROPLASTY;  Surgeon: Garald Balding, MD;  Location: [facility];  Service: Orthopedics;  Laterality: Left;   TOTAL HIP ARTHROPLASTY Right 09/22/2016   TOTAL HIP ARTHROPLASTY Right 09/22/2016   Procedure: RIGHT TOTAL HIP ARTHROPLASTY;  Surgeon: Garald Balding, MD;  Location: [facility];  Service: Orthopedics;  Laterality: Right;   Social History   Occupational  History   Not on file  Tobacco Use   Smoking status: Every Day    Packs/day: 0.50    Years: 25.00    Total pack years: 12.50    Types: Cigarettes   Smokeless tobacco: Never  Vaping Use   Vaping Use: Former  Substance and Sexual Activity   Alcohol use: Yes    Comment: social drinking   Drug use: No    Comment: states quit   Sexual activity: Yes

## 2021-11-06 ENCOUNTER — Ambulatory Visit: Payer: Managed Care, Other (non HMO) | Admitting: Physical Medicine and Rehabilitation

## 2021-11-06 ENCOUNTER — Ambulatory Visit: Payer: Self-pay

## 2021-11-06 VITALS — BP 118/81 | HR 87

## 2021-11-06 DIAGNOSIS — M5416 Radiculopathy, lumbar region: Secondary | ICD-10-CM | POA: Diagnosis not present

## 2021-11-06 MED ORDER — METHYLPREDNISOLONE ACETATE 80 MG/ML IJ SUSP
40.0000 mg | Freq: Once | INTRAMUSCULAR | Status: AC
  Administered 2021-11-06: 40 mg

## 2021-11-06 NOTE — Patient Instructions (Signed)

## 2021-11-06 NOTE — Progress Notes (Signed)
Numeric Pain Rating Scale and Functional Assessment Average Pain 9   In the last MONTH (on 0-10 scale) has pain interfered with the following?  1. General activity like being  able to carry out your everyday physical activities such as walking, climbing stairs, carrying groceries, or moving a chair?  Rating(9)   +Driver, -BT, -Dye Allergies.  Standing, walking and bending over makes pain worse. Pain radiates down right leg

## 2021-11-10 NOTE — Procedures (Signed)
Lumbar Epidural Steroid Injection - Interlaminar Approach with Fluoroscopic Guidance  Patient: Charles [name]      Date of Birth: 09/05/1970 MRN: [id number] PCP: Celene Squibb, MD      Visit Date: 11/06/2021   Universal Protocol:     Consent Given By: the patient  Position: PRONE  Additional Comments: Vital signs were monitored before and after the procedure. Patient was prepped and draped in the usual sterile fashion. The correct patient, procedure, and site was verified.   Injection Procedure Details:   Procedure diagnoses: Lumbar radiculopathy [M54.16]   Meds Administered:  Meds ordered this encounter  Medications   methylPREDNISolone acetate (DEPO-MEDROL) injection 40 mg     Laterality: Midline  Location/Site:  L4-5  Needle: 3.5 in., 20 ga. Tuohy  Needle Placement: Paramedian epidural  Findings:   -Comments: Excellent flow of contrast into the epidural space.  Procedure Details: Using a paramedian approach from the side mentioned above, the region overlying the inferior lamina was localized under fluoroscopic visualization and the soft tissues overlying this structure were infiltrated with 4 ml. of 1% Lidocaine without Epinephrine. The Tuohy needle was inserted into the epidural space using a paramedian approach.   The epidural space was localized using loss of resistance along with counter oblique bi-planar fluoroscopic views.  After negative aspirate for air, blood, and CSF, a 2 ml. volume of Isovue-250 was injected into the epidural space and the flow of contrast was observed. Radiographs were obtained for documentation purposes.    The injectate was administered into the level noted above.   Additional Comments:  The patient tolerated the procedure well Dressing: 2 x 2 sterile gauze and Band-Aid    Post-procedure details: Patient was observed during the procedure. Post-procedure instructions were reviewed.  Patient left the clinic in stable condition.

## 2021-11-10 NOTE — Progress Notes (Signed)
Charles [name] - 52 y.o. male MRN [id number]  Date of birth: 1970/05/24  Office Visit Note: Visit Date: 11/06/2021 PCP: Celene Squibb, MD Referred by: Celene Squibb, MD  Subjective: Chief Complaint  Patient presents with   Lower Back - Pain   HPI:  Charles [name] is a 51 y.o. male who comes in today for planned repeat Midline L4-5  Lumbar Interlaminar epidural steroid injection with fluoroscopic guidance.  The patient has failed conservative care including home exercise, medications, time and activity modification.  This injection will be diagnostic and hopefully therapeutic.  Please see requesting physician notes for further details and justification. Patient received more than 50% pain relief from prior injection.   Referring: Annie Main, PA-C.Marland Kitchen  Consider facet joint blocks.   ROS Otherwise per HPI.  Assessment & Plan: Visit Diagnoses:    ICD-10-CM   1. Lumbar radiculopathy  M54.16 XR C-ARM NO REPORT    Epidural Steroid injection    methylPREDNISolone acetate (DEPO-MEDROL) injection 40 mg      Plan: No additional findings.   Meds & Orders:  Meds ordered this encounter  Medications   methylPREDNISolone acetate (DEPO-MEDROL) injection 40 mg    Orders Placed This Encounter  Procedures   XR C-ARM NO REPORT   Epidural Steroid injection    Follow-up: Return for visit to requesting provider as needed.   Procedures: No procedures performed  Lumbar Epidural Steroid Injection - Interlaminar Approach with Fluoroscopic Guidance  Patient: Charles [name]      Date of Birth: 11-23-1970 MRN: [id number] PCP: Celene Squibb, MD      Visit Date: 11/06/2021   Universal Protocol:     Consent Given By: the patient  Position: PRONE  Additional Comments: Vital signs were monitored before and after the procedure. Patient was prepped and draped in the usual sterile fashion. The correct patient, procedure, and site was verified.   Injection Procedure Details:   Procedure  diagnoses: Lumbar radiculopathy [M54.16]   Meds Administered:  Meds ordered this encounter  Medications   methylPREDNISolone acetate (DEPO-MEDROL) injection 40 mg     Laterality: Midline  Location/Site:  L4-5  Needle: 3.5 in., 20 ga. Tuohy  Needle Placement: Paramedian epidural  Findings:   -Comments: Excellent flow of contrast into the epidural space.  Procedure Details: Using a paramedian approach from the side mentioned above, the region overlying the inferior lamina was localized under fluoroscopic visualization and the soft tissues overlying this structure were infiltrated with 4 ml. of 1% Lidocaine without Epinephrine. The Tuohy needle was inserted into the epidural space using a paramedian approach.   The epidural space was localized using loss of resistance along with counter oblique bi-planar fluoroscopic views.  After negative aspirate for air, blood, and CSF, a 2 ml. volume of Isovue-250 was injected into the epidural space and the flow of contrast was observed. Radiographs were obtained for documentation purposes.    The injectate was administered into the level noted above.   Additional Comments:  The patient tolerated the procedure well Dressing: 2 x 2 sterile gauze and Band-Aid    Post-procedure details: Patient was observed during the procedure. Post-procedure instructions were reviewed.  Patient left the clinic in stable condition.   Clinical History: MRI LUMBAR SPINE WITHOUT CONTRAST   TECHNIQUE: Multiplanar, multisequence MR imaging of the lumbar spine was performed. No intravenous contrast was administered.   COMPARISON:  MRI 10/27/2019   FINDINGS: Segmentation:  Standard.   Alignment: Straightening with slight reversal  of the lumbar lordosis. Trace retrolisthesis of L5 on S1.   Vertebrae:  No fracture, evidence of discitis, or bone lesion.   Conus medullaris and cauda equina: Conus extends to the L1 level. Conus and cauda equina appear  normal.   Paraspinal and other soft tissues: Negative.   Disc levels:   T12-L1: Unremarkable.   L1-L2: Unremarkable.   L2-L3: Minimal annular disc bulge. No foraminal or canal stenosis. Unchanged.   L3-L4: Minimal annular disc bulge. No foraminal or canal stenosis. Unchanged.   L4-L5: Mild annular disc bulge and endplate osteophytic spurring. Minimal facet hypertrophy. Mild-moderate right and mild left foraminal stenosis. No canal stenosis. No significant interval progression from prior.   L5-S1: Mild annular disc bulge with endplate osteophytic spurring. Mild bilateral facet hypertrophy. Moderate bilateral foraminal stenosis. No canal stenosis. No significant interval progression from prior.   IMPRESSION: 1. Mild degenerative changes of the lumbar spine, not significantly progressed from prior study. 2. 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 3. Mild-moderate right and mild left foraminal stenosis at L4-L5. 4. No canal stenosis at any level.     Electronically Signed   By: Davina Poke D.O.   On: 10/21/2021 10:39     Objective:  VS:  HT:    WT:   BMI:     BP:118/81  HR:87bpm  TEMP: ( )  RESP:  Physical Exam Vitals and nursing note reviewed.  Constitutional:      General: He is not in acute distress.    Appearance: Normal appearance. He is not ill-appearing.  HENT:     Head: Normocephalic and atraumatic.     Right Ear: External ear normal.     Left Ear: External ear normal.     Nose: No congestion.  Eyes:     Extraocular Movements: Extraocular movements intact.  Cardiovascular:     Rate and Rhythm: Normal rate.     Pulses: Normal pulses.  Pulmonary:     Effort: Pulmonary effort is normal. No respiratory distress.  Abdominal:     General: There is no distension.     Palpations: Abdomen is soft.  Musculoskeletal:        General: No tenderness or signs of injury.     Cervical back: Neck supple.     Right lower leg: No edema.     Left  lower leg: No edema.     Comments: Patient has good distal strength without clonus.  Skin:    Findings: No erythema or rash.  Neurological:     General: No focal deficit present.     Mental Status: He is alert and oriented to person, place, and time.     Sensory: No sensory deficit.     Motor: No weakness or abnormal muscle tone.     Coordination: Coordination normal.  Psychiatric:        Mood and Affect: Mood normal.        Behavior: Behavior normal.      Imaging: No results found.

## 2021-11-13 ENCOUNTER — Encounter: Payer: Managed Care, Other (non HMO) | Admitting: Physical Medicine and Rehabilitation

## 2021-11-17 ENCOUNTER — Ambulatory Visit (INDEPENDENT_AMBULATORY_CARE_PROVIDER_SITE_OTHER): Payer: Managed Care, Other (non HMO) | Admitting: Orthopedic Surgery

## 2021-11-17 ENCOUNTER — Telehealth: Payer: Self-pay

## 2021-11-17 DIAGNOSIS — M5416 Radiculopathy, lumbar region: Secondary | ICD-10-CM

## 2021-11-17 NOTE — Telephone Encounter (Signed)
Dr Marlou Sa is wanting patient to be worked in this week to see Dr Laurance Flatten for his back. Can you please get patient worked in?

## 2021-11-18 ENCOUNTER — Ambulatory Visit (INDEPENDENT_AMBULATORY_CARE_PROVIDER_SITE_OTHER): Payer: Managed Care, Other (non HMO) | Admitting: Orthopedic Surgery

## 2021-11-18 ENCOUNTER — Ambulatory Visit (INDEPENDENT_AMBULATORY_CARE_PROVIDER_SITE_OTHER): Payer: Managed Care, Other (non HMO)

## 2021-11-18 ENCOUNTER — Encounter: Payer: Self-pay | Admitting: Orthopedic Surgery

## 2021-11-18 VITALS — BP 133/78 | HR 84 | Ht 72.0 in | Wt 195.0 lb

## 2021-11-18 DIAGNOSIS — M47816 Spondylosis without myelopathy or radiculopathy, lumbar region: Secondary | ICD-10-CM | POA: Diagnosis not present

## 2021-11-18 DIAGNOSIS — M5416 Radiculopathy, lumbar region: Secondary | ICD-10-CM

## 2021-11-18 NOTE — Progress Notes (Signed)
Orthopedic Spine Surgery Office Note  Assessment: Patient is a 51 y.o. male with low back pain that radiates into his right leg in a L4 or L5 distribution. 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 Has appearance of flat back on XR. Injections to L4 and L5 have provided him short term relief in the past.   Plan: -Explained that initially conservative treatment is tried as a significant number of patients may experience relief with these treatment modalities. Discussed that the conservative treatments include:  -activity modification  -physical therapy  -over the counter pain medications  -medrol dosepak  -lumbar steroid injections -Patient has tried activity modification, over the counter pain medications, injections -He has tried conservative treatments without any lasting relief, so discussed operative management as an option. Will need a scoli film to determine my plan. He would to need to quit smoking before any surgery so recommended he quit now. If he has quit for the 4 weeks leading up to our next visit, then we can discuss surgery in more detail -Patient should return to office in 4 weeks, repeat x-rays of lumbar spine at next visit: scoli films   Patient expressed understanding of the plan and all questions were answered to the patient's satisfaction.   ___________________________________________________________________________   History:  Patient is a 51 y.o. male who presents today for lumbar spine.  Patient has had over 1 year of low back pain that radiates into his right leg.  It has been stable since onset in terms of severity. He states that he feels it go down the lateral aspect of his thigh into the lateral aspect of the leg.  It does not radiate into the foot.  He gets numbness and paresthesias in the same distribution in the thigh.  There is numbness and paresthesias do not extend past the knee.  There is no trauma or injury that brought on the pain.  Pain is worse  with activity improves with rest.  He has gotten pretty good relief with injections to the L4 and L5 nerves on the right side.  However, they only last about a week.  Pain is felt on a daily basis.   Weakness: Yes, feels right leg is weaker.  No other weakness Symptoms of imbalance: Denies Paresthesias and numbness: Yes, right lateral thigh.  No other numbness or paresthesias Bowel or bladder incontinence: Denies Saddle anesthesia: Denies  Treatments tried: Activity modification, NSAIDs, steroid injections  Review of systems: Denies fevers and chills, night sweats, unexplained weight loss, history of cancer.  Has had pain in his leg that wakes him at night.  Past medical history: GERD Hypertension Psoriasis Chronic pain Psoriatic arthritis COPD Hypothyroidism  Allergies: NKDA  Past surgical history:  Bilateral THA Right superficial peroneal nerve release  Neck surgery Polypectomy  Social history: Reports use of nicotine product (smoking, vaping, patches, smokeless) - 2 packs/week Alcohol use: yes, 4 drinks/week Denies recreational drug use   Physical Exam:  General: no acute distress, appears stated age Neurologic: alert, answering questions appropriately, following commands Respiratory: unlabored breathing on room air, symmetric chest rise Psychiatric: appropriate affect, normal cadence to speech   MSK (spine):  -Strength exam      Left  Right EHL    5/5  5/5 TA    5/5  5/5 GSC    5/5  5/5 Knee extension  5/5  5/5 Hip flexion   5/5  5/5  -Sensory exam    Sensation intact to light touch in L3-S1 nerve  distributions of bilateral lower extremities  -Achilles DTR: 1/4 on the left, 1/4 on the right -Patellar tendon DTR: 1/4 on the left, 1/4 on the right  -Straight leg raise: negative -Contralateral straight leg raise: negative -Clonus: no beats bilaterally  -Left hip exam: no pain through range of motion, negative stinchfield -Right hip exam: no pain  through range of motion, negative stinchfield  Imaging: XR of the lumbar spine from 10/19/2021 and 11/18/2021 was independently reviewed and interpreted, showing disc height loss at L2/3, L3/4, L4/5. No fracture or dislocation. No evidence of instability on flexion/extension. Appears to have loss of lordosis. Hips not well visualized to make PI measurement  MRI of the lumbar spine from 10/19/2021 was independently reviewed and interpreted, showing DDD at L2/3, L3/4, L4/5, and L5/S1. Foraminal stenosis at L4/5 and L5/S1. No significant central or lateral recess stenosis.    Patient name: Charles [name] Patient MRN: [id number] Date of visit: 11/18/21

## 2021-11-20 ENCOUNTER — Encounter: Payer: Self-pay | Admitting: Orthopedic Surgery

## 2021-11-20 NOTE — Progress Notes (Signed)
Office Visit Note   Patient: Charles [name]           Date of Birth: 11/25/1970           MRN: [id number] Visit Date: 11/17/2021 Requested by: Celene Squibb, MD [place],  [place] [address] PCP: Celene Squibb, MD  Subjective: Chief Complaint  Patient presents with   Lower Back - Pain    HPI: Charles [name] is a 51 y.o. male who presents to the office reporting low back pain.  He did undergo a lumbar ESI 11/06/2021 with good relief for 1 week but then the pain has recurred.  Reports pain which wakes him from sleep at night.  Back pain is worse than the leg pain.  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  He has had 5 injections this year.  Also has a history of facet ablation with Dr. Ernestina Patches this year.  He would like to have a more permanent solution to this problem.                ROS: All systems reviewed are negative as they relate to the chief complaint within the history of present illness.  Patient denies fevers or chills.  Assessment & Plan: Visit Diagnoses:  1. Lumbar radiculopathy     Plan: Impression is low back pain and right leg pain.  Looks like it is radicular in nature based on positive but short-lived response to lumbar ESI.  Has a history of prior extensive superficial peroneal nerve decompression.  That may or may not be playing a role in his current symptoms.  Looks really to be more back related.  MRI scan does show moderate bilateral foraminal stenosis at L5-S1 as well as mild to moderate right sided foraminal stenosis at L4-5.  Would like for him to continue current duty level with less than 30 pounds of lifting for the next 6 weeks.  Would also like him to see Dr. Laurance Flatten for surgical evaluation.  He will follow-up with [place] as needed.  Follow-Up Instructions: No follow-ups on file.   Orders:  No orders of the defined types were placed in this encounter.  No orders of the defined types were placed in this encounter.      Procedures: No procedures performed   Clinical Data: No additional findings.  Objective: Vital Signs: There were no vitals taken for this visit.  Physical Exam:  Constitutional: Patient appears well-developed HEENT:  Head: Normocephalic Eyes:EOM are normal Neck: Normal range of motion Cardiovascular: Normal rate Pulmonary/chest: Effort normal Neurologic: Patient is alert Skin: Skin is warm Psychiatric: Patient has normal mood and affect  Ortho Exam: Ortho exam demonstrates mild pain with forward lateral bending.  No nerve root tension signs on the left mildly positive on the right has some paresthesias in the foot on the right-hand side dorsally compared to the left well-healed surgical incision is present on that right leg.  Compartments are soft.  No groin pain on the right or left-hand side with internal/external rotation of the leg.  Pedal pulses palpable.  Does have no trochanteric tenderness.  Specialty Comments:  MRI LUMBAR SPINE WITHOUT CONTRAST   TECHNIQUE: Multiplanar, multisequence MR imaging of the lumbar spine was performed. No intravenous contrast was administered.   COMPARISON:  MRI 10/27/2019   FINDINGS: Segmentation:  Standard.   Alignment: Straightening with slight reversal of the lumbar lordosis. Trace retrolisthesis of L5 on S1.   Vertebrae:  No fracture, evidence of discitis, or bone lesion.   Conus medullaris and cauda equina: Conus extends to the L1 level. Conus and cauda equina appear normal.   Paraspinal and other soft tissues: Negative.   Disc levels:   T12-L1: Unremarkable.   L1-L2: Unremarkable.   L2-L3: Minimal annular disc bulge. No foraminal or canal stenosis. Unchanged.   L3-L4: Minimal annular disc bulge. No foraminal or canal stenosis. Unchanged.   L4-L5: Mild annular disc bulge and endplate osteophytic spurring. Minimal facet hypertrophy. Mild-moderate right and mild left foraminal stenosis. No canal stenosis. No  significant interval progression from prior.   L5-S1: Mild annular disc bulge with endplate osteophytic spurring. Mild bilateral facet hypertrophy. Moderate bilateral foraminal stenosis. No canal stenosis. No significant interval progression from prior.   IMPRESSION: 1. Mild degenerative changes of the lumbar spine, not significantly progressed from prior study. 2. 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 3. Mild-moderate right and mild left foraminal stenosis at L4-L5. 4. No canal stenosis at any level.     Electronically Signed   By: Davina Poke D.O.   On: 10/21/2021 10:39  Imaging: No results found.   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    Pain in right leg 08/31/2019   Grade I hemorrhoids 03/03/2019   GERD (gastroesophageal reflux disease) 05/25/2018   Dysphagia 05/25/2018   Acute left-sided low back pain with left-sided sciatica 02/01/2018   Pain in left hip 02/01/2018   Constipation 05/11/2017   Low back pain 04/05/2017   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   Bilateral sacroiliitis ([place]) 05/22/2016   Psoriatic arthritis ([place]) 05/22/2016   High risk medications (not anticoagulants) long-term use 05/22/2016   DJD (degenerative joint disease), cervical 05/22/2016   Osteoarthritis of spine with radiculopathy, lumbar region 05/22/2016   Cephalalgia 03/16/2016   Chronic daily headache 03/16/2016   Neck pain 03/16/2016   Hemorrhoids 03/13/2016   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   Insomnia 12/26/2015   Abdominal pain, epigastric 11/13/2015   Dark stools 11/13/2015   Rectal bleeding 11/13/2015   Numbness 09/26/2015   Neuropathic pain involving left lateral femoral cutaneous nerve 09/26/2015   Avascular necrosis of left femoral head ([place]) 07/30/2015   Avascular necrosis of  right femoral head ([place]) 07/30/2015   Psoriasis 07/30/2015   Chronic obstructive pulmonary disease (COPD) ([place]) 07/30/2015   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   Past Medical History:  Diagnosis Date   Arthritis    also has avascular necrosis    Chronic back pain    Chronic neck pain    Chronic shoulder pain    COPD (chronic obstructive pulmonary disease) ([facility])    GERD (gastroesophageal reflux disease)    tums  OTC  medication   Headache    HTN (hypertension)    no longer   Hypothyroidism    Psoriatic arthritis ([place])    Sleep apnea    tested more than 5 yrs ago...negative results    Family History  Problem Relation Age of Onset   Heart failure Mother    Diabetes Mother    Hypertension Mother    Heart failure Father    Prostate cancer Father    Arthritis Father    Hypertension Sister    Heart attack Sister    Hypercholesterolemia Sister    Healthy Daughter  Colon cancer Neg Hx    Gastric cancer Neg Hx    Esophageal cancer Neg Hx     Past Surgical History:  Procedure Laterality Date   COLONOSCOPY WITH PROPOFOL N/A 12/09/2015   four 5 mm polyps in sigmoid colon and descending colon. Pathology revealed tubular adenomas. Recommended repeat in 5 years.   COLONOSCOPY WITH PROPOFOL N/A 06/20/2021   Procedure: COLONOSCOPY WITH PROPOFOL;  Surgeon: Daneil Dolin, MD;  [facility];  Service: Endoscopy;  Laterality: N/A;  1:00pm   ESOPHAGOGASTRODUODENOSCOPY (EGD) WITH PROPOFOL N/A 12/09/2015   Procedure: ESOPHAGOGASTRODUODENOSCOPY (EGD) WITH PROPOFOL;  Surgeon: Daneil Dolin, MD;  [facility];  Service: Endoscopy;  Laterality: N/A;   ESOPHAGOGASTRODUODENOSCOPY (EGD) WITH PROPOFOL N/A 06/09/2018   mild erosive reflux esophagitis, non-critical Schatzki's ring s/p dilation   JOINT REPLACEMENT     left hip 2017   MALONEY DILATION N/A 06/09/2018   Procedure: MALONEY DILATION;  Surgeon: Daneil Dolin, MD;  [facility];  Service:  Endoscopy;  Laterality: N/A;   NECK SURGERY     POLYPECTOMY  06/20/2021   Procedure: POLYPECTOMY;  Surgeon: Daneil Dolin, MD;  [facility];  Service: Endoscopy;;   SHOULDER SURGERY     SUPERFICIAL PERONEAL NERVE RELEASE Right 08/19/2020   Procedure: right leg superficial peroneal nerve decompression, lateral compartment release;  Surgeon: Meredith Pel, MD;  Location: [facility];  Service: Orthopedics;  Laterality: Right;   TOTAL HIP ARTHROPLASTY Left 07/30/2015   Procedure: TOTAL HIP ARTHROPLASTY;  Surgeon: Garald Balding, MD;  Location: [facility];  Service: Orthopedics;  Laterality: Left;   TOTAL HIP ARTHROPLASTY Right 09/22/2016   TOTAL HIP ARTHROPLASTY Right 09/22/2016   Procedure: RIGHT TOTAL HIP ARTHROPLASTY;  Surgeon: Garald Balding, MD;  Location: [facility];  Service: Orthopedics;  Laterality: Right;   Social History   Occupational History   Not on file  Tobacco Use   Smoking status: Every Day    Packs/day: 0.50    Years: 25.00    Total pack years: 12.50    Types: Cigarettes   Smokeless tobacco: Never  Vaping Use   Vaping Use: Former  Substance and Sexual Activity   Alcohol use: Yes    Comment: social drinking   Drug use: No    Comment: states quit   Sexual activity: Yes

## 2021-12-01 ENCOUNTER — Telehealth: Payer: Self-pay | Admitting: Radiology

## 2021-12-01 MED ORDER — GABAPENTIN 300 MG PO CAPS: 300.0000 mg | ORAL_CAPSULE | Freq: Three times a day (TID) | ORAL | 0 refills | Status: DC

## 2021-12-01 MED ORDER — MELOXICAM 7.5 MG PO TABS: 7.5000 mg | ORAL_TABLET | Freq: Every day | ORAL | 0 refills | Status: DC

## 2021-12-01 MED ORDER — METHOCARBAMOL 500 MG PO TABS: 500.0000 mg | ORAL_TABLET | Freq: Four times a day (QID) | ORAL | 0 refills | Status: AC | PRN

## 2021-12-01 NOTE — Telephone Encounter (Signed)
Patient left voicemail on triage line stating that he has appointment with Dr. Laurance Flatten on 12/17/2021 to set up surgery.  He states that the pain has gotten unbearable and that he is unable to sit, stand, or walk. He would like to know if there is something that can be done or if he could come in sooner as he is unable to take the pain. He asks for a return call.  CB [id number]   Dr. Hinda Lenis advise. Do you want for me to work patient in sooner to be seen? Per you last office note, he was going to work on smoking cessation.

## 2021-12-01 NOTE — Addendum Note (Signed)
Addended by: Ileene Rubens on: 12/01/2021 02:51 PM   Modules accepted: Orders

## 2021-12-01 NOTE — Telephone Encounter (Signed)
Patient would like for you to prescribe the gabapentin, meloxicam, and methocarbamol. He uses [facility] in [place]. He wanted to let you know that he has not smoked since he saw you on 11/18/2021. I advised he would likely need to keep the 12/17/2021 appt as that would be 4 weeks smoking cessation, but that I would let you know.  Keep existing appt?

## 2021-12-17 ENCOUNTER — Ambulatory Visit: Payer: Self-pay

## 2021-12-17 ENCOUNTER — Ambulatory Visit (INDEPENDENT_AMBULATORY_CARE_PROVIDER_SITE_OTHER): Payer: Managed Care, Other (non HMO) | Admitting: Orthopedic Surgery

## 2021-12-17 DIAGNOSIS — M47816 Spondylosis without myelopathy or radiculopathy, lumbar region: Secondary | ICD-10-CM | POA: Diagnosis not present

## 2021-12-17 NOTE — Progress Notes (Signed)
Orthopedic Spine Surgery Office Note  Assessment: Patient is a 51 y.o. male with low back pain that radiates into his right leg in a L4 or L5 distribution. 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 Has appearance of flat back on XR.  Scoliosis films show LL-PI mismatch and a 4.6 cm SVA.  ESI has provided him with good short term relief in the past   Plan: -Explained that initially conservative treatment is tried as a significant number of patients may experience relief with these treatment modalities. Discussed that the conservative treatments include:  -activity modification  -physical therapy  -over the counter pain medications  -medrol dosepak  -lumbar steroid injections -Patient has tried Activity modification, physical therapy, NSAIDs, steroid injections  -Explained that no operative management would be offered until he is nicotine free.  He would need to be nicotine free for 8 weeks prior to surgery -Since he has tried many conservative treatments without relief, offered him a referral to pain management to help with pain while he works on quitting smoking.  He was not interested at this time as his primary care provider has prescribed medications that help with the severe pain -Patient should return to office once he has quit smoking so we can discuss operative management further   Patient expressed understanding of the plan and all questions were answered to the patient's satisfaction.   ___________________________________________________________________________  History: Patient is a 51 y.o. male who has been previously seen in the office for symptoms consistent with lumbar radiculopathy. Since the last visit, symptom intensity has remained the same.  He still having significant low back pain that radiates into his right leg.  He feels it goes along the lateral aspect of the thigh, but the worst of it is in an L5 distribution along the lateral aspect of the leg and into the  dorsum of the foot.  He still has no left-sided symptoms.  No change in bowel or bladder habits. He gets numbness and paresthesias into the thigh. Injections have provided him good but temporary relief with ESIs.  Pain is still felt on a daily basis.  Pain is gotten to the point that it interferes with his work.  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 Still smoking and using nicotine patches. Says he has cut down on the cigarettes.   Previous treatments: Activity modification, physical therapy, NSAIDs, steroid injections   COPY OF FIRST NOTE Patient is a 51 y.o. male who presents today for lumbar spine.  Patient has had over 1 year of low back pain that radiates into his right leg.  It has been stable since onset in terms of severity. He states that he feels it go down the lateral aspect of his thigh into the lateral aspect of the leg.  It does not radiate into the foot.  He gets numbness and paresthesias in the same distribution in the thigh.  There is numbness and paresthesias do not extend past the knee.  There is no trauma or injury that brought on the pain.  Pain is worse with activity improves with rest.  He has gotten pretty good relief with injections to the L4 and L5 nerves on the right side.  However, they only last about a week.  Pain is felt on a daily basis.     Weakness: Yes, feels right leg is weaker.  No other weakness Symptoms of imbalance: Denies Paresthesias and numbness:  Yes, right lateral thigh.  No other numbness or paresthesias Bowel or bladder incontinence: Denies Saddle anesthesia: Denies END OF COPY  Physical Exam:  General: no acute distress, appears stated age Neurologic: alert, answering questions appropriately, following commands Respiratory: unlabored breathing on room air, symmetric chest rise Psychiatric: appropriate affect, normal cadence to speech   MSK (spine):  -Strength exam      Left  Right EHL     5/5  5/5 TA    5/5  5/5 GSC    5/5  5/5 Knee extension  5/5  5/5 Hip flexion   5/5  5/5  -Sensory exam    Sensation intact to light touch in L3-S1 nerve distributions of bilateral lower extremities  -Achilles DTR: 1/4 on the left, 1/4 on the right -Patellar tendon DTR: 1/4 on the left, 1/4 on the right  -Straight leg raise: negative -Contralateral straight leg raise: negative -Clonus: no beats bilaterally  Imaging: XR of the lumbar spine from 10/19/2021 and 11/18/2021 was previously independently reviewed and interpreted, showing disc height loss at L2/3, L3/4, L4/5. No fracture or dislocation. No evidence of instability on flexion/extension. Appears to have loss of lordosis. Hips not well visualized to make PI measurement  XR scoli from 12/17/2021 was independently reviewed and interpreted, showing prior C6/7 ACDF with no evidence of complication though difficult to completely visualize on these non-dedicated films. LL of 23, PI of 36. SVA of 4.6cm.   MRI of the lumbar spine from 10/19/2021 was previously independently reviewed and interpreted, showing DDD at L2/3, L3/4, L4/5, and L5/S1. Foraminal stenosis at L4/5 and L5/S1. No significant central or lateral recess stenosis.   Patient name: Charles [name] Patient MRN: [id number] Date of visit: 12/17/21

## 2022-01-01 ENCOUNTER — Other Ambulatory Visit: Payer: Self-pay | Admitting: Orthopedic Surgery

## 2022-01-14 ENCOUNTER — Other Ambulatory Visit (HOSPITAL_COMMUNITY): Payer: Self-pay | Admitting: Family Medicine

## 2022-01-14 ENCOUNTER — Ambulatory Visit (HOSPITAL_COMMUNITY)
Admission: RE | Admit: 2022-01-14 | Discharge: 2022-01-14 | Disposition: A | Discharge status: Home / Self Care | Payer: Managed Care, Other (non HMO) | Source: Ambulatory Visit | Attending: Family Medicine | Admitting: Family Medicine

## 2022-01-14 DIAGNOSIS — R0609 Other forms of dyspnea: Secondary | ICD-10-CM | POA: Diagnosis present

## 2022-02-08 NOTE — Progress Notes (Unsigned)
Charles [name], male    DOB: September 04, 1970   MRN: [id number]   Brief patient profile:  3 yowm welder active smoker  referred to pulmonary clinic 02/10/2022 by Charles [name]  for doe.        History of Present Illness  02/10/2022  Pulmonary/ 1st office eval/Charles [name]  Chief Complaint  Patient presents with   Consult    Cough and wheeze worse when laying down.  SOB with exertion x 6 months  Dyspnea:  mb and back 100 ft flat and back stops half way x 6 months  Cough: worse in am dark brown x months Sleep: bed is flat if bed  SABA use: usually p ex  No obvious day to day or daytime pattern/variability or assoc   mucus plugs or hemoptysis or cp or chest tightness, subjective wheeze or overt  hb symptoms.    a. Also denies any obvious fluctuation of symptoms with weather or environmental changes or other aggravating or alleviating factors except as outlined above   No unusual exposure hx or h/o childhood pna/ asthma or knowledge of premature birth.  Current Allergies, Complete Past Medical History, Past Surgical History, Family History, and Social History were reviewed in [employer] record.  ROS  The following are not active complaints unless bolded Hoarseness, sore throat, dysphagia, dental problems, itching, sneezing,  nasal congestion or discharge of excess mucus or purulent secretions, ear ache,   fever, chills, sweats, unintended wt loss or wt gain, classically pleuritic or exertional cp,  orthopnea pnd or arm/hand swelling  or leg swelling, presyncope, palpitations, abdominal pain, anorexia, nausea, vomiting, diarrhea  or change in bowel habits or change in bladder habits, change in stools or change in urine, dysuria, hematuria,  rash, arthralgias, visual complaints, headache, numbness, weakness or ataxia or problems with walking or coordination,  change in mood or  memory.           Past Medical History:  Diagnosis Date   Arthritis    also has avascular necrosis    Chronic  back pain    Chronic neck pain    Chronic shoulder pain    COPD (chronic obstructive pulmonary disease) (HCC)    GERD (gastroesophageal reflux disease)    tums  OTC  medication   Headache    HTN (hypertension)    no longer   Hypothyroidism    Psoriatic arthritis ([place])    Sleep apnea    tested more than 5 yrs ago...negative results    Outpatient Medications Prior to Visit  Medication Sig Dispense Refill   amLODipine (NORVASC) 10 MG tablet Take 10 mg by mouth daily.     gabapentin (NEURONTIN) 300 MG capsule Take 1 capsule (300 mg total) by mouth 3 (three) times daily. (Patient taking differently: Take 300 mg by mouth 2 (two) times daily.) 90 capsule 0   levothyroxine (SYNTHROID) 88 MCG tablet Take 88 mcg by mouth daily before breakfast.     meloxicam (MOBIC) 7.5 MG tablet Take 1 tablet (7.5 mg total) by mouth daily. 30 tablet 0   olmesartan (BENICAR) 40 MG tablet Take 40 mg by mouth daily.     oxyCODONE-acetaminophen (PERCOCET) 10-325 MG tablet Take 1 tablet by mouth every 4 (four) hours as needed for pain. 20 tablet 0   pantoprazole (PROTONIX) 40 MG tablet Take 1 tablet (40 mg total) by mouth daily. 30 minutes before breakfast (Patient taking differently: Take 40 mg by mouth daily as needed (acid reflux). 30 minutes before  breakfast) 90 tablet 3   albuterol (PROVENTIL HFA;VENTOLIN HFA) 108 (90 Base) MCG/ACT inhaler Inhale 1-2 puffs into the lungs every 4 (four) hours as needed for wheezing or shortness of breath.  (Patient not taking: Reported on 02/10/2022)     No facility-administered medications prior to visit.     Objective:     BP (!) 142/82 (BP Location: Left Arm, Patient Position: Sitting, Cuff Size: Normal)   Pulse 87   Temp 97.9 F (36.6 C) (Oral)   Ht 6' (1.829 m)   Wt 188 lb (85.3 kg)   SpO2 98%   BMI 25.50 kg/m   SpO2: 98 %  Amb wm nad / nasal tone to voice   HEENT : Oropharynx  clear     NECK :  without  apparent JVD/ palpable Nodes/TM    LUNGS: no acc  muscle use,  Min barrel  contour chest wall with bilateral  insp/exp sonorous ronchi  and  without cough on insp or exp maneuvers and min  Hyperresonant  to  percussion bilaterally    CV:  RRR  no s3 or murmur or increase in P2, and no edema   ABD:  soft and nontender with pos end  insp Hoover's  in the supine position.  No bruits or organomegaly appreciated   MS:  Nl gait/ ext warm without deformities Or obvious joint restrictions  calf tenderness, cyanosis or clubbing     SKIN: warm and dry without lesions    NEURO:  alert, approp, nl sensorium with  no motor or cerebellar deficits apparent.            I personally reviewed images and agree with radiology impression as follows:  CXR:   pa and lateral 01/14/22  No active cardiopulmonary disease   Labs ordered/ reviewed:      Chemistry      Component Value Date/Time   NA 138 02/10/2022 1433   K 4.2 02/10/2022 1433   CL 105 02/10/2022 1433   CO2 22 02/10/2022 1433   BUN 18 02/10/2022 1433   CREATININE 0.91 02/10/2022 1433   CREATININE 0.91 07/07/2018 1005      Component Value Date/Time   CALCIUM 9.4 02/10/2022 1433   ALKPHOS 42 02/24/2019 1217   AST 18 02/24/2019 1217   ALT 15 02/24/2019 1217   BILITOT 0.7 02/24/2019 1217        Lab Results  Component Value Date   WBC 8.0 02/10/2022   HGB 14.7 02/10/2022   HCT 43.0 02/10/2022   MCV 95.9 02/10/2022   PLT 209.0 02/10/2022       EOS                                                             0.3                                    02/10/2022   Lab Results  Component Value Date   DDIMER <0.27 04/26/2013      Lab Results  Component Value Date   TSH 6.57 (H) 02/10/2022     Lab Results  Component Value Date   PROBNP 14.0 02/10/2022  Assessment   Cigarette smoker Counseled re importance of smoking cessation but did not meet time criteria for separate billing     Low-dose CT lung cancer screening is recommended for patients who are 65-80 years  of age with a 20+ pack-year history of smoking and who are currently smoking or quit <=15 years ago. No coughing up blood  No unintentional weight loss of > 15 pounds in the last 6 months - pt is eligible for scanning yearly starting 05/17/2022  > advised    Asthmatic bronchitis , chronic Active smoker - Labs ordered 02/10/2022  :  allergy screen  Eos 0.3    alpha one AT phenotype   - 02/10/2022  After extensive coaching inhaler device,  effectiveness =    75% > breztri trial    Group D (now reclassified as E) in terms of symptom/risk and laba/lama/ICS  therefore appropriate rx at this point >>>  breztri 2bid and approp saba prn   F/u in 3-4 months with pfts in [place] office.     DOE (dyspnea on exertion) Onset summer 2023  - 02/10/2022   Walked on RA   x  3  lap(s) =  approx 750  ft  @ mod pace, stopped due to end of study  with lowest 02 sats 98% but sob p 2nd    Symptoms of doe to mb and back  are  disproportionate to objective findings and not clear to what extent this is actually a pulmonary  problem but pt does appear to have difficult to sort out respiratory symptoms of unknown origin for which  DDX  = almost all start with A and  include Adherence, Ace Inhibitors, Acid Reflux, Active Sinus Disease, Alpha 1 Antitripsin deficiency, Anxiety masquerading as Airways dz,  ABPA,  Allergy(esp in young), Aspiration (esp in elderly), Adverse effects of meds,  Active smoking or Vaping, A bunch of PE's/clot burden (a few small clots can't cause this syndrome unless there is already severe underlying pulm or vascular dz with poor reserve),  Anemia or thyroid disorder, plus two Bs  = Bronchiectasis and Beta blocker use..and one C= CHF    Adherence is always the initial "prime suspect" and is a multilayered concern that requires a "trust but verify" approach in every patient - starting with knowing how to use medications, especially inhalers, correctly, keeping up with refills and understanding the  fundamental difference between maintenance and prns vs those medications only taken for a very short course and then stopped and not refilled.  - see hfa teaching  - Advised:  formulary restrictions will be an ongoing challenge for the forseable future and I would be happy to pick an alternative if the pt will first  provide me a list of them -  pt  will need to return here for training for any new device that is required eg dpi vs hfa vs respimat.    In the meantime we can always provide samples so that the patient never runs out of any needed respiratory medications.    Active smoking also at top of list   ? Allergy/asthma component > check screen, use high dose ICS either breztri or symb 160 depending on insurance  ? Active sinus dz >  omnicef 300 mg bid   ? Acid (or non-acid) GERD > always difficult to exclude as up to 75% of pts in some series report no assoc GI/ Heartburn symptoms> rec continue max (24h)  acid suppression.     ?  Alpha one def > send phenotype   ? Anxiety/depression/ decondtioning  > usually at the bottom of this list of usual suspects but  may interfere with adherence and also interpretation of response or lack thereof to symptom management which can be quite subjective.   ? Chf/ cardiac asthma > very unlikely with bnp so low         Hypothyroidism Lab Results  Component Value Date   TSH 6.57 (H) 02/10/2022    On synthroid 88 rec increase to 100 mcg daily and f/u with Dr Donetta Potts, MD 02/10/2022

## 2022-02-10 ENCOUNTER — Ambulatory Visit: Payer: Managed Care, Other (non HMO) | Admitting: Internal Medicine

## 2022-02-10 ENCOUNTER — Encounter: Payer: Self-pay | Admitting: Internal Medicine

## 2022-02-10 VITALS — BP 142/82 | HR 87 | Temp 97.9°F | Ht 72.0 in | Wt 188.0 lb

## 2022-02-10 DIAGNOSIS — E039 Hypothyroidism, unspecified: Secondary | ICD-10-CM

## 2022-02-10 DIAGNOSIS — J4489 Other specified chronic obstructive pulmonary disease: Secondary | ICD-10-CM | POA: Diagnosis not present

## 2022-02-10 DIAGNOSIS — F17210 Nicotine dependence, cigarettes, uncomplicated: Secondary | ICD-10-CM

## 2022-02-10 DIAGNOSIS — R0609 Other forms of dyspnea: Secondary | ICD-10-CM | POA: Diagnosis not present

## 2022-02-10 LAB — CBC WITH DIFFERENTIAL/PLATELET
Basophils Absolute: 0 10*3/uL (ref 0.0–0.1)
Basophils Relative: 0.4 % (ref 0.0–3.0)
Eosinophils Absolute: 0.3 10*3/uL (ref 0.0–0.7)
Eosinophils Relative: 3.6 % (ref 0.0–5.0)
HCT: 43 % (ref 39.0–52.0)
Hemoglobin: 14.7 g/dL (ref 13.0–17.0)
Lymphocytes Relative: 35.9 % (ref 12.0–46.0)
Lymphs Abs: 2.9 10*3/uL (ref 0.7–4.0)
MCHC: 34.2 g/dL (ref 30.0–36.0)
MCV: 95.9 fl (ref 78.0–100.0)
Monocytes Absolute: 0.6 10*3/uL (ref 0.1–1.0)
Monocytes Relative: 7.5 % (ref 3.0–12.0)
Neutro Abs: 4.2 10*3/uL (ref 1.4–7.7)
Neutrophils Relative %: 52.6 % (ref 43.0–77.0)
Platelets: 209 10*3/uL (ref 150.0–400.0)
RBC: 4.48 Mil/uL (ref 4.22–5.81)
RDW: 12 % (ref 11.5–15.5)
WBC: 8 10*3/uL (ref 4.0–10.5)

## 2022-02-10 LAB — BASIC METABOLIC PANEL
BUN: 18 mg/dL (ref 6–23)
CO2: 22 mEq/L (ref 19–32)
Calcium: 9.4 mg/dL (ref 8.4–10.5)
Chloride: 105 mEq/L (ref 96–112)
Creatinine, Ser: 0.91 mg/dL (ref 0.40–1.50)
GFR: 98.81 mL/min (ref >60.00)
Glucose, Bld: 104 mg/dL — ABNORMAL HIGH (ref 70–99)
Potassium: 4.2 mEq/L (ref 3.5–5.1)
Sodium: 138 mEq/L (ref 135–145)

## 2022-02-10 LAB — BRAIN NATRIURETIC PEPTIDE: Pro B Natriuretic peptide (BNP): 14 pg/mL (ref 0.0–100.0)

## 2022-02-10 LAB — TSH: TSH: 6.57 u[IU]/mL — ABNORMAL HIGH (ref 0.35–5.50)

## 2022-02-10 MED ORDER — BREZTRI AEROSPHERE 160-9-4.8 MCG/ACT IN AERO: 2.0000 | INHALATION_SPRAY | Freq: Two times a day (BID) | RESPIRATORY_TRACT | 0 refills | Status: DC

## 2022-02-10 MED ORDER — BREZTRI AEROSPHERE 160-9-4.8 MCG/ACT IN AERO: INHALATION_SPRAY | RESPIRATORY_TRACT | 11 refills | Status: DC

## 2022-02-10 MED ORDER — CEFDINIR 300 MG PO CAPS: 300.0000 mg | ORAL_CAPSULE | Freq: Two times a day (BID) | ORAL | 0 refills | Status: DC

## 2022-02-10 NOTE — Assessment & Plan Note (Addendum)
Onset summer 2023  - 02/10/2022   Walked on RA   x  3  lap(s) =  approx 750  ft  @ mod pace, stopped due to end of study  with lowest 02 sats 98% but sob p 2nd    Symptoms of doe to mb and back  are  disproportionate to objective findings and not clear to what extent this is actually a pulmonary  problem but pt does appear to have difficult to sort out respiratory symptoms of unknown origin for which  DDX  = almost all start with A and  include Adherence, Ace Inhibitors, Acid Reflux, Active Sinus Disease, Alpha 1 Antitripsin deficiency, Anxiety masquerading as Airways dz,  ABPA,  Allergy(esp in young), Aspiration (esp in elderly), Adverse effects of meds,  Active smoking or Vaping, A bunch of PE's/clot burden (a few small clots can't cause this syndrome unless there is already severe underlying pulm or vascular dz with poor reserve),  Anemia or thyroid disorder, plus two Bs  = Bronchiectasis and Beta blocker use..and one C= CHF    Adherence is always the initial "prime suspect" and is a multilayered concern that requires a "trust but verify" approach in every patient - starting with knowing how to use medications, especially inhalers, correctly, keeping up with refills and understanding the fundamental difference between maintenance and prns vs those medications only taken for a very short course and then stopped and not refilled.  - see hfa teaching  - Advised:  formulary restrictions will be an ongoing challenge for the forseable future and I would be happy to pick an alternative if the pt will first  provide me a list of them -  pt  will need to return here for training for any new device that is required eg dpi vs hfa vs respimat.    In the meantime we can always provide samples so that the patient never runs out of any needed respiratory medications.    Active smoking also at top of list   ? Allergy/asthma component > check screen, use high dose ICS either breztri or symb 160 depending on  insurance  ? Active sinus dz >  omnicef 300 mg bid   ? Acid (or non-acid) GERD > always difficult to exclude as up to 75% of pts in some series report no assoc GI/ Heartburn symptoms> rec continue max (24h)  acid suppression.     ? Alpha one def > send phenotype   ? Anxiety/depression/ decondtioning  > usually at the bottom of this list of usual suspects but  may interfere with adherence and also interpretation of response or lack thereof to symptom management which can be quite subjective.   ? Chf/ cardiac asthma > very unlikely with bnp so low   Each maintenance medication was reviewed in detail including emphasizing most importantly the difference between maintenance and prns and under what circumstances the prns are to be triggered using an action plan format where appropriate.  Total time for H and P, chart review, counseling, reviewing hfa device(s) , directly observing portions of ambulatory 02 saturation study/ and generating customized AVS unique to this office visit / same day charting = 45 min new pt eval

## 2022-02-10 NOTE — Patient Instructions (Addendum)
Omnicef 300 mg one  twice daily x 10 days   For cough congestion > mucinex dm 1200 mg every 12 hours as needed   Plan A = Automatic = Always=    Breztri  Take 2 puffs first thing in am and then another 2 puffs about 12 hours later.    Work on inhaler technique:  relax and gently blow all the way out then take a nice smooth full deep breath back in, triggering the inhaler at same time you start breathing in.  Hold breath in for at least  5 seconds if you can. Blow out breztri  thru nose. Rinse and gargle with water when done.  If mouth or throat bother you at all,  try brushing teeth/gums/tongue with arm and hammer toothpaste/ make a slurry and gargle and spit out.     Plan B = Backup (to supplement plan A, not to replace it) Only use your albuterol inhaler as a rescue medication to be used if you can't catch your breath by resting or doing a relaxed purse lip breathing pattern.  - The less you use it, the better it will work when you need it. - Ok to use the inhaler up to 2 puffs  every 4 hours if you must but call for appointment if use goes up over your usual need - Don't leave home without it !!  (think of it like the spare tire for your car)     Please remember to go to the lab department   for your tests - we will call you with the results when they are available.   Please schedule a follow up visit in 3-4  months but call sooner if needed   with  PFTs on return in [place]

## 2022-02-10 NOTE — Assessment & Plan Note (Signed)
Active smoker - Labs ordered 02/10/2022  :  allergy profile   alpha one AT phenotype   - 02/10/2022  After extensive coaching inhaler device,  effectiveness =    75% > breztri trial    Group D (now reclassified as E) in terms of symptom/risk and laba/lama/ICS  therefore appropriate rx at this point >>>  breztri 2bid and approp saba prn   F/u in 3-4 months with pfts in [place] office.

## 2022-02-10 NOTE — Assessment & Plan Note (Addendum)
Counseled re importance of smoking cessation but did not meet time criteria for separate billing     Low-dose CT lung cancer screening is recommended for patients who are 2-50 years of age with a 20+ pack-year history of smoking and who are currently smoking or quit <=15 years ago. No coughing up blood  No unintentional weight loss of > 15 pounds in the last 6 months - pt is eligible for scanning yearly starting 05/17/2022  > advised

## 2022-02-11 ENCOUNTER — Telehealth: Payer: Self-pay | Admitting: Internal Medicine

## 2022-02-11 ENCOUNTER — Encounter: Payer: Self-pay | Admitting: Internal Medicine

## 2022-02-11 ENCOUNTER — Telehealth: Payer: Self-pay

## 2022-02-11 DIAGNOSIS — E039 Hypothyroidism, unspecified: Secondary | ICD-10-CM | POA: Insufficient documentation

## 2022-02-11 LAB — IGE: IgE (Immunoglobulin E), Serum: 31 kU/L (ref <=114)

## 2022-02-11 MED ORDER — BREZTRI AEROSPHERE 160-9-4.8 MCG/ACT IN AERO: 2.0000 | INHALATION_SPRAY | Freq: Two times a day (BID) | RESPIRATORY_TRACT | 0 refills | Status: DC

## 2022-02-11 MED ORDER — BREZTRI AEROSPHERE 160-9-4.8 MCG/ACT IN AERO: INHALATION_SPRAY | RESPIRATORY_TRACT | 11 refills | Status: DC

## 2022-02-11 NOTE — Telephone Encounter (Signed)
Patient states Charles [name] is going to cost over $400 (out of pocket cost).  Patient has 2 samples that had been given to him during his OV with Dr. Melvyn Novas 02/10/22.  Patient states he was to call the office if he could not afford the [facility].  Dr. Melvyn Novas: Please advise.  Patient will continue to use the 2 samples of Breztri you gave him.  Patient is not sure what inhalers are covered on his insurance formulary.

## 2022-02-11 NOTE — Assessment & Plan Note (Signed)
Lab Results  Component Value Date   TSH 6.57 (H) 02/10/2022    On synthroid 88 rec increase to 100 mcg daily and f/u with Dr [name]

## 2022-02-11 NOTE — Telephone Encounter (Signed)
Called and spoke with patient.  Gave all information.  Nothing further needed.  Notation in result note section of labs.

## 2022-02-12 NOTE — Telephone Encounter (Signed)
Make sure he tried to use coupon   Have [facility] do pharmacy review for cheaper subsititutes

## 2022-02-12 NOTE — Telephone Encounter (Signed)
Can we run a ticket to see what is covered under patients insurance. He is telling us the Charles [name] is costing him over $400.  Please and thank you

## 2022-02-16 ENCOUNTER — Other Ambulatory Visit (HOSPITAL_COMMUNITY): Payer: Self-pay

## 2022-02-16 NOTE — Telephone Encounter (Signed)
At this time the patient has a deductible to meet with his plan which is making his co-pays so high. Unfortunately there is no good alternatives because of this when doing the benefits investigation it shows that any combination of ICS/LABA and LAMA options would be more expensive then the [facility]. According to test claim there is a manufacture coupon being applied which is helping the cost go down to the $400.

## 2022-02-17 LAB — ALPHA-1-ANTITRYPSIN PHENOTYP: A-1 Antitrypsin: 159 mg/dL (ref 101–187)

## 2022-02-19 NOTE — Telephone Encounter (Signed)
Spoke with patient.  Informed of pharmacy team feedback on [place] and copays.  Patient states he is still using the Breztri samples given to him at last OV.  He cannot afford $400 for the [facility] so will not fill.  Dr Melvyn Novas:  Please advise.  Thank you.

## 2022-02-19 NOTE — Telephone Encounter (Signed)
Left message for pt to call back regarding copay for [facility].  Manufacturer coupon is being applied.

## 2022-02-19 NOTE — Telephone Encounter (Signed)
Try generic symbicort 160 or dulera 200 for cost

## 2022-02-23 ENCOUNTER — Other Ambulatory Visit (HOSPITAL_COMMUNITY): Payer: Self-pay

## 2022-02-23 NOTE — Telephone Encounter (Signed)
Must need to meet deductible - if can't pay fo the symbicort generic lets do airsupra with coupon 2 puffs q 4 h prn

## 2022-02-23 NOTE — Telephone Encounter (Signed)
Called patient but he did not answer. Left message for patient to call back.   I checked with Meagan in [place] and she stated that they did have a coupon for Airsupra. Will check with patient to see if he wants to come by the [place] office for the coupon before sending in the prescription.

## 2022-02-23 NOTE — Telephone Encounter (Signed)
Dulera- [id number] Generic Symbicort- [id number]

## 2022-02-24 MED ORDER — AIRSUPRA 90-80 MCG/ACT IN AERO: 2.0000 | INHALATION_SPRAY | RESPIRATORY_TRACT | Status: DC | PRN

## 2022-02-24 MED ORDER — AIRSUPRA 90-80 MCG/ACT IN AERO: 2.0000 | INHALATION_SPRAY | RESPIRATORY_TRACT | 3 refills | Status: AC | PRN

## 2022-02-24 NOTE — Telephone Encounter (Signed)
Spoke with pt and he does want to get the coupon and try the Airsupra. Pt states he will stop by [place] office to pick up coupon and Airsupra order was sent to pt preferred pharmacy. Nothing further needed at this time.   Routing to [employer] as [employer]

## 2022-02-24 NOTE — Addendum Note (Signed)
Addended by: Dierdre Highman on: 02/24/2022 10:43 AM   Modules accepted: Orders

## 2022-02-24 NOTE — Telephone Encounter (Signed)
Coupon left at front with Mel for pickup!

## 2022-03-02 ENCOUNTER — Other Ambulatory Visit: Payer: Self-pay

## 2022-03-02 DIAGNOSIS — R0609 Other forms of dyspnea: Secondary | ICD-10-CM

## 2022-03-03 ENCOUNTER — Ambulatory Visit (INDEPENDENT_AMBULATORY_CARE_PROVIDER_SITE_OTHER): Payer: Managed Care, Other (non HMO) | Admitting: Internal Medicine

## 2022-03-03 DIAGNOSIS — R0609 Other forms of dyspnea: Secondary | ICD-10-CM | POA: Diagnosis not present

## 2022-03-03 LAB — PULMONARY FUNCTION TEST
DL/VA % pred: 92 %
DL/VA: 4.08 ml/min/mmHg/L
DLCO cor % pred: 97 %
DLCO cor: 30.22 ml/min/mmHg
DLCO unc % pred: 97 %
DLCO unc: 30.22 ml/min/mmHg
FEF 25-75 Post: 3.06 L/sec
FEF 25-75 Pre: 2.56 L/sec
FEF2575-%Change-Post: 19 %
FEF2575-%Pred-Post: 83 %
FEF2575-%Pred-Pre: 69 %
FEV1-%Change-Post: 9 %
FEV1-%Pred-Post: 88 %
FEV1-%Pred-Pre: 80 %
FEV1-Post: 3.68 L
FEV1-Pre: 3.35 L
FEV1FVC-%Change-Post: 4 %
FEV1FVC-%Pred-Pre: 84 %
FEV6-%Change-Post: 3 %
FEV6-%Pred-Post: 99 %
FEV6-%Pred-Pre: 96 %
FEV6-Post: 5.14 L
FEV6-Pre: 4.97 L
FEV6FVC-%Change-Post: 0 %
FEV6FVC-%Pred-Post: 101 %
FEV6FVC-%Pred-Pre: 101 %
FVC-%Change-Post: 4 %
FVC-%Pred-Post: 99 %
FVC-%Pred-Pre: 94 %
FVC-Post: 5.31 L
FVC-Pre: 5.06 L
Post FEV1/FVC ratio: 69 %
Post FEV6/FVC ratio: 98 %
Pre FEV1/FVC ratio: 66 %
Pre FEV6/FVC Ratio: 98 %
RV % pred: 154 %
RV: 3.23 L
TLC % pred: 114 %
TLC: 8.33 L

## 2022-03-03 NOTE — Progress Notes (Signed)
Full PFT performed today. °

## 2022-03-03 NOTE — Patient Instructions (Signed)
Full PFT performed today. °

## 2022-03-05 ENCOUNTER — Telehealth: Payer: Self-pay | Admitting: Physical Medicine and Rehabilitation

## 2022-03-05 NOTE — Telephone Encounter (Signed)
Patient wants to schedule an appointment with Dr. Ernestina Patches and get an injection

## 2022-03-06 ENCOUNTER — Other Ambulatory Visit: Payer: Self-pay | Admitting: Physical Medicine and Rehabilitation

## 2022-03-06 DIAGNOSIS — M5416 Radiculopathy, lumbar region: Secondary | ICD-10-CM

## 2022-03-06 NOTE — Telephone Encounter (Signed)
Spoke with patient and he is requesting another injection. He stated it lasted a couple weeks at more than 70% relief. No new falls, accidents, or injuries. Please advise

## 2022-03-11 ENCOUNTER — Encounter: Payer: Self-pay | Admitting: *Deleted

## 2022-03-11 ENCOUNTER — Encounter: Payer: Self-pay | Admitting: Internal Medicine

## 2022-03-11 ENCOUNTER — Ambulatory Visit (INDEPENDENT_AMBULATORY_CARE_PROVIDER_SITE_OTHER): Payer: Managed Care, Other (non HMO) | Admitting: Internal Medicine

## 2022-03-11 VITALS — BP 106/60 | HR 77 | Temp 97.9°F | Ht 72.0 in | Wt 189.0 lb

## 2022-03-11 DIAGNOSIS — E039 Hypothyroidism, unspecified: Secondary | ICD-10-CM

## 2022-03-11 DIAGNOSIS — J4489 Other specified chronic obstructive pulmonary disease: Secondary | ICD-10-CM

## 2022-03-11 DIAGNOSIS — F17210 Nicotine dependence, cigarettes, uncomplicated: Secondary | ICD-10-CM | POA: Diagnosis not present

## 2022-03-11 NOTE — Assessment & Plan Note (Signed)
Active smoker/MM - Labs ordered 02/10/2022  :  allergy profile Eos 0.3   alpha one AT phenotype   MM  level 159    - 02/10/2022  After extensive coaching inhaler device,  effectiveness =    75% > breztri trial  - PFT's  03/03/22  FEV1 3.68 (88 % ) ratio 0.69 p 9% improvement from saba p breztri prior to study with DLCO  23.67 (97%)   and FV curve blunted in effort dep portion and ERV 35%  at wt 193    Minimal airflow obst p breztri so this is asthma/ not copd (esp with nl dlco) and ok to use air supra prn for now but would make more sense to use symbicort 160 2bid and reduce air supra to rarely needing just the way albuterol is used

## 2022-03-11 NOTE — Assessment & Plan Note (Signed)
Pt admits not consistent with thyroid dosing/ timing > f/u with Dr [name] planned          Each maintenance medication was reviewed in detail including emphasizing most importantly the difference between maintenance and prns and under what circumstances the prns are to be triggered using an action plan format where appropriate.  Total time for H and P, chart review, counseling, reviewing hfa  device(s) and generating customized AVS unique to this office visit / same day charting = 91mn

## 2022-03-11 NOTE — Assessment & Plan Note (Signed)
Counseled re importance of smoking cessation but did not meet time criteria for separate billing   °

## 2022-03-11 NOTE — Progress Notes (Signed)
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        History of Present Illness  02/10/2022  Pulmonary/ 1st office eval/Vanetta Rule  Chief Complaint  Patient presents with   Consult    Cough and wheeze worse when laying down.  SOB with exertion x 6 months  Dyspnea:  mb and back 100 ft flat and back stops half way x 6 months  Cough: worse in am dark brown x months Sleep: bed is flat if bed  SABA use: usually p ex Rec Omnicef 300 mg one  twice daily x 10 days  For cough congestion > mucinex dm 1200 mg every 12 hours as needed  Plan A = Automatic = Always=    Breztri  Take 2 puffs first thing in am and then another 2 puffs about 12 hours later.   Work on inhaler technique:    Plan B = Backup (to supplement plan A, not to replace it) Only use your albuterol inhaler as a rescue medication  Eos 0.3   alpha one AT phenotype   MM  level 159     Please schedule a follow up visit in 3-4  months but call sooner if needed   with  PFTs on return in [place]     03/11/2022  f/u ov/Briggett Tuccillo re: GOLD 1    maint on air supra  2 bid and still smoking / could not afford breztri Chief Complaint  Patient presents with   Follow-up    Breathing is doing well and no new co's. He is using his Colin Ina about 2 x per wk on average.   Dyspnea:  Not limited by breathing from desired activities  = cannot name one activity  Cough: occ am mucus beige (much less dark p omnicef) Sleeping: fine flat bed / one pillow under head  SABA use: no extra doses  02: none  Covid status:   vax x 2 / never infected      No obvious day to day or daytime variability or assoc excess/ purulent sputum or mucus plugs or hemoptysis or cp or chest tightness, subjective wheeze or overt sinus or hb symptoms.   Sleeping now  without nocturnal  or early am exacerbation  of respiratory  c/o's or need for noct saba.  Also denies any obvious fluctuation of symptoms with weather or environmental changes or other aggravating or alleviating factors except as outlined above   No unusual exposure hx or h/o childhood pna/ asthma or knowledge of premature birth.  Current Allergies, Complete Past Medical History, Past Surgical History, Family History, and Social History were reviewed in [employer] record.  ROS  The following are not active complaints unless bolded Hoarseness, sore throat, dysphagia, dental problems, itching, sneezing,  nasal congestion or discharge of excess mucus or purulent secretions, ear ache,   fever, chills, sweats, unintended wt loss or wt gain, classically pleuritic or exertional cp,  orthopnea pnd or arm/hand swelling  or leg swelling, presyncope, palpitations, abdominal pain, anorexia, nausea, vomiting, diarrhea  or change in bowel habits or change in bladder habits, change in stools or change in urine, dysuria, hematuria,  rash, arthralgias, visual complaints, headache, numbness, weakness or ataxia or problems with walking or coordination,  change in mood or  memory.        Current  Meds  Medication Sig   Albuterol-Budesonide (AIRSUPRA) 90-80 MCG/ACT AERO Inhale 2 puffs into the lungs every 4 (four) hours as needed.   amLODipine (NORVASC) 10 MG tablet Take 10 mg by mouth daily.        cefdinir (OMNICEF) 300 MG capsule Take 1 capsule (300 mg total) by mouth 2 (two) times daily.   gabapentin (NEURONTIN) 300 MG capsule Take 1 capsule (300 mg total) by mouth 3 (three) times daily. (Patient taking differently: Take 300 mg by mouth 2 (two) times daily.)   levothyroxine (SYNTHROID) 88 MCG tablet Take 88 mcg by mouth daily before breakfast.   meloxicam (MOBIC) 7.5 MG tablet Take 1 tablet (7.5 mg total) by mouth daily.   olmesartan (BENICAR) 40 MG tablet Take 40 mg by mouth daily.   oxyCODONE-acetaminophen (PERCOCET) 10-325 MG tablet Take 1 tablet by mouth every 4 (four) hours  as needed for pain.   pantoprazole (PROTONIX) 40 MG tablet Take 1 tablet (40 mg total) by mouth daily. 30 minutes before breakfast (Patient taking differently: Take 40 mg by mouth daily as needed (acid reflux). 30 minutes before breakfast)           Objective:     Wt Readings from Last 3 Encounters:  03/11/22 189 lb (85.7 kg)  02/10/22 188 lb (85.3 kg)  11/18/21 195 lb (88.5 kg)      Vital signs reviewed  03/11/2022  - Note at rest 02 sats  96% on RA   General appearance:    amb somber wm nad      HEENT : Oropharynx  clear       NECK :  without  apparent JVD/ palpable Nodes/TM    LUNGS: no acc muscle use,  Nl contour chest with minimal insp/exp rhonchi bilaterally without cough on insp or exp maneuvers   CV:  RRR  no s3 or murmur or increase in P2, and no edema   ABD:  soft and nontender with nl inspiratory excursion in the supine position. No bruits or organomegaly appreciated   MS:  Nl gait/ ext warm without deformities Or obvious joint restrictions  calf tenderness, cyanosis or clubbing    SKIN: warm and dry without lesions    NEURO:  alert, approp, nl sensorium with  no motor or cerebellar deficits apparent.           Lab Results  Component Value Date   TSH 6.57 (H) 02/10/2022           Assessment

## 2022-03-11 NOTE — Patient Instructions (Addendum)
Continue air supra up to 2 puffs every 4 hours but if find that twice daily is not enough then I will call you in generic symbicort 80 2 every 12 hours   Please schedule a follow up visit in  6 months but call sooner if needed in  [place]

## 2022-03-12 ENCOUNTER — Telehealth: Payer: Self-pay | Admitting: Physical Medicine and Rehabilitation

## 2022-03-12 NOTE — Telephone Encounter (Signed)
Patient called needing to schedule an appointment with Dr. Ernestina Patches for his back. The number to contact patient is [phone]

## 2022-03-13 ENCOUNTER — Telehealth: Payer: Self-pay | Admitting: Physical Medicine and Rehabilitation

## 2022-03-13 NOTE — Telephone Encounter (Signed)
Patient called needing to schedule an appointment with Dr. Ernestina Patches. The number to contact patient is [phone]

## 2022-03-16 NOTE — Telephone Encounter (Signed)
Spoke with patient and scheduled injection for 03/19/22. Patient aware driver needed

## 2022-03-19 ENCOUNTER — Ambulatory Visit: Payer: Managed Care, Other (non HMO) | Admitting: Physical Medicine and Rehabilitation

## 2022-03-19 ENCOUNTER — Other Ambulatory Visit: Payer: Self-pay

## 2022-03-19 VITALS — BP 134/80 | HR 70

## 2022-03-19 DIAGNOSIS — M5416 Radiculopathy, lumbar region: Secondary | ICD-10-CM | POA: Diagnosis not present

## 2022-03-19 MED ORDER — METHYLPREDNISOLONE ACETATE 80 MG/ML IJ SUSP
80.0000 mg | Freq: Once | INTRAMUSCULAR | Status: AC
  Administered 2022-03-19: 80 mg

## 2022-03-19 NOTE — Progress Notes (Signed)
Functional Pain Scale - descriptive words and definitions  Unmanageable (7)  Pain interferes with normal ADL's/nothing seems to help/sleep is very difficult/active distractions are very difficult to concentrate on. Severe range order  Average Pain 7   +Driver, -BT, -Dye Allergies.  Lower back pain on right side that radiates down the right leg. Lying down makes pain better

## 2022-03-19 NOTE — Patient Instructions (Signed)

## 2022-04-01 NOTE — Procedures (Signed)
Lumbosacral Transforaminal Epidural Steroid Injection - Sub-Pedicular Approach with Fluoroscopic Guidance  Patient: Charles [name]      Date of Birth: 03-26-1970 MRN: [id number] PCP: Celene Squibb, MD      Visit Date: 03/19/2022   Universal Protocol:    Date/Time: 03/19/2022  Consent Given By: the patient  Position: PRONE  Additional Comments: Vital signs were monitored before and after the procedure. Patient was prepped and draped in the usual sterile fashion. The correct patient, procedure, and site was verified.   Injection Procedure Details:   Procedure diagnoses: Lumbar radiculopathy [M54.16]    Meds Administered:  Meds ordered this encounter  Medications   methylPREDNISolone acetate (DEPO-MEDROL) injection 80 mg    Laterality: Right  Location/Site: L5  Needle:5.0 in., 22 ga.  Short bevel or Quincke spinal needle  Needle Placement: Transforaminal  Findings:    -Comments: Excellent flow of contrast along the nerve, nerve root and into the epidural space.  Procedure Details: After squaring off the end-plates to get a true AP view, the C-arm was positioned so that an oblique view of the foramen as noted above was visualized. The target area is just inferior to the "nose of the scotty dog" or sub pedicular. The soft tissues overlying this structure were infiltrated with 2-3 ml. of 1% Lidocaine without Epinephrine.  The spinal needle was inserted toward the target using a "trajectory" view along the fluoroscope beam.  Under AP and lateral visualization, the needle was advanced so it did not puncture dura and was located close the 6 O'Clock position of the pedical in AP tracterory. Biplanar projections were used to confirm position. Aspiration was confirmed to be negative for CSF and/or blood. A 1-2 ml. volume of Isovue-250 was injected and flow of contrast was noted at each level. Radiographs were obtained for documentation purposes.   After attaining the desired flow of  contrast documented above, a 0.5 to 1.0 ml test dose of 0.25% Marcaine was injected into each respective transforaminal space.  The patient was observed for 90 seconds post injection.  After no sensory deficits were reported, and normal lower extremity motor function was noted,   the above injectate was administered so that equal amounts of the injectate were placed at each foramen (level) into the transforaminal epidural space.   Additional Comments:  No complications occurred Dressing: 2 x 2 sterile gauze and Band-Aid    Post-procedure details: Patient was observed during the procedure. Post-procedure instructions were reviewed.  Patient left the clinic in stable condition.

## 2022-04-01 NOTE — Progress Notes (Signed)
Charles [name] - 52 y.o. male MRN [id number]  Date of birth: January 29, 1970  Office Visit Note: Visit Date: 03/19/2022 PCP: Celene Squibb, MD Referred by: Callie Fielding, MD  Subjective: Chief Complaint  Patient presents with   Lower Back - Pain   HPI:  [name] is a 52 y.o. male who comes in today at the request of Dr. Ileene Rubens for planned Right L5-S1 Lumbar Transforaminal epidural steroid injection with fluoroscopic guidance.  The patient has failed conservative care including home exercise, medications, time and activity modification.  This injection will be diagnostic and hopefully therapeutic.  Please see requesting physician notes for further details and justification.   ROS Otherwise per HPI.  Assessment & Plan: Visit Diagnoses:    ICD-10-CM   1. Lumbar radiculopathy  M54.16 XR C-ARM NO REPORT    Epidural Steroid injection    methylPREDNISolone acetate (DEPO-MEDROL) injection 80 mg      Plan: No additional findings.   Meds & Orders:  Meds ordered this encounter  Medications   methylPREDNISolone acetate (DEPO-MEDROL) injection 80 mg    Orders Placed This Encounter  Procedures   XR C-ARM NO REPORT   Epidural Steroid injection    Follow-up: Return for visit to requesting provider as needed.   Procedures: No procedures performed  Lumbosacral Transforaminal Epidural Steroid Injection - Sub-Pedicular Approach with Fluoroscopic Guidance  Patient: Charles [name]      Date of Birth: 1970-09-18 MRN: [id number] PCP: Celene Squibb, MD      Visit Date: 03/19/2022   Universal Protocol:    Date/Time: 03/19/2022  Consent Given By: the patient  Position: PRONE  Additional Comments: Vital signs were monitored before and after the procedure. Patient was prepped and draped in the usual sterile fashion. The correct patient, procedure, and site was verified.   Injection Procedure Details:   Procedure diagnoses: Lumbar radiculopathy [M54.16]    Meds Administered:   Meds ordered this encounter  Medications   methylPREDNISolone acetate (DEPO-MEDROL) injection 80 mg    Laterality: Right  Location/Site: L5  Needle:5.0 in., 22 ga.  Short bevel or Quincke spinal needle  Needle Placement: Transforaminal  Findings:    -Comments: Excellent flow of contrast along the nerve, nerve root and into the epidural space.  Procedure Details: After squaring off the end-plates to get a true AP view, the C-arm was positioned so that an oblique view of the foramen as noted above was visualized. The target area is just inferior to the "nose of the scotty dog" or sub pedicular. The soft tissues overlying this structure were infiltrated with 2-3 ml. of 1% Lidocaine without Epinephrine.  The spinal needle was inserted toward the target using a "trajectory" view along the fluoroscope beam.  Under AP and lateral visualization, the needle was advanced so it did not puncture dura and was located close the 6 O'Clock position of the pedical in AP tracterory. Biplanar projections were used to confirm position. Aspiration was confirmed to be negative for CSF and/or blood. A 1-2 ml. volume of Isovue-250 was injected and flow of contrast was noted at each level. Radiographs were obtained for documentation purposes.   After attaining the desired flow of contrast documented above, a 0.5 to 1.0 ml test dose of 0.25% Marcaine was injected into each respective transforaminal space.  The patient was observed for 90 seconds post injection.  After no sensory deficits were reported, and normal lower extremity motor function was noted,   the above injectate  was administered so that equal amounts of the injectate were placed at each foramen (level) into the transforaminal epidural space.   Additional Comments:  No complications occurred Dressing: 2 x 2 sterile gauze and Band-Aid    Post-procedure details: Patient was observed during the procedure. Post-procedure instructions were  reviewed.  Patient left the clinic in stable condition.    Clinical History: MRI LUMBAR SPINE WITHOUT CONTRAST   TECHNIQUE: Multiplanar, multisequence MR imaging of the lumbar spine was performed. No intravenous contrast was administered.   COMPARISON:  MRI 10/27/2019   FINDINGS: Segmentation:  Standard.   Alignment: Straightening with slight reversal of the lumbar lordosis. Trace retrolisthesis of L5 on S1.   Vertebrae:  No fracture, evidence of discitis, or bone lesion.   Conus medullaris and cauda equina: Conus extends to the L1 level. Conus and cauda equina appear normal.   Paraspinal and other soft tissues: Negative.   Disc levels:   T12-L1: Unremarkable.   L1-L2: Unremarkable.   L2-L3: Minimal annular disc bulge. No foraminal or canal stenosis. Unchanged.   L3-L4: Minimal annular disc bulge. No foraminal or canal stenosis. Unchanged.   L4-L5: Mild annular disc bulge and endplate osteophytic spurring. Minimal facet hypertrophy. Mild-moderate right and mild left foraminal stenosis. No canal stenosis. No significant interval progression from prior.   L5-S1: Mild annular disc bulge with endplate osteophytic spurring. Mild bilateral facet hypertrophy. Moderate bilateral foraminal stenosis. No canal stenosis. No significant interval progression from prior.   IMPRESSION: 1. Mild degenerative changes of the lumbar spine, not significantly progressed from prior study. 2. 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 3. Mild-moderate right and mild left foraminal stenosis at L4-L5. 4. No canal stenosis at any level.     Electronically Signed   By: Davina Poke D.O.   On: 10/21/2021 10:39     Objective:  VS:  HT:    WT:   BMI:     BP:134/80  HR:70bpm  TEMP: ( )  RESP:  Physical Exam Vitals and nursing note reviewed.  Constitutional:      General: He is not in acute distress.    Appearance: Normal appearance. He is not ill-appearing.  HENT:      Head: Normocephalic and atraumatic.     Right Ear: External ear normal.     Left Ear: External ear normal.     Nose: No congestion.  Eyes:     Extraocular Movements: Extraocular movements intact.  Cardiovascular:     Rate and Rhythm: Normal rate.     Pulses: Normal pulses.  Pulmonary:     Effort: Pulmonary effort is normal. No respiratory distress.  Abdominal:     General: There is no distension.     Palpations: Abdomen is soft.  Musculoskeletal:        General: No tenderness or signs of injury.     Cervical back: Neck supple.     Right lower leg: No edema.     Left lower leg: No edema.     Comments: Patient has good distal strength without clonus.  Skin:    Findings: No erythema or rash.  Neurological:     General: No focal deficit present.     Mental Status: He is alert and oriented to person, place, and time.     Sensory: No sensory deficit.     Motor: No weakness or abnormal muscle tone.     Coordination: Coordination normal.  Psychiatric:        Mood and Affect: Mood  normal.        Behavior: Behavior normal.      Imaging: No results found.

## 2022-04-22 ENCOUNTER — Ambulatory Visit: Payer: Managed Care, Other (non HMO) | Admitting: Surgical

## 2022-04-22 ENCOUNTER — Other Ambulatory Visit: Payer: Self-pay

## 2022-04-22 ENCOUNTER — Other Ambulatory Visit (INDEPENDENT_AMBULATORY_CARE_PROVIDER_SITE_OTHER): Payer: Managed Care, Other (non HMO)

## 2022-04-22 DIAGNOSIS — M25511 Pain in right shoulder: Secondary | ICD-10-CM

## 2022-04-22 DIAGNOSIS — M19011 Primary osteoarthritis, right shoulder: Secondary | ICD-10-CM

## 2022-04-25 ENCOUNTER — Encounter: Payer: Self-pay | Admitting: Surgical

## 2022-04-25 MED ORDER — BUPIVACAINE HCL 0.5 % IJ SOLN
9.0000 mL | INTRAMUSCULAR | Status: AC | PRN
  Administered 2022-04-22: 9 mL via INTRA_ARTICULAR

## 2022-04-25 MED ORDER — LIDOCAINE HCL 1 % IJ SOLN
5.0000 mL | INTRAMUSCULAR | Status: AC | PRN
  Administered 2022-04-22: 5 mL

## 2022-04-25 MED ORDER — METHYLPREDNISOLONE ACETATE 40 MG/ML IJ SUSP
40.0000 mg | INTRAMUSCULAR | Status: AC | PRN
  Administered 2022-04-22: 40 mg via INTRA_ARTICULAR

## 2022-04-25 NOTE — Progress Notes (Signed)
Office Visit Note   Patient: Charles [name]           Date of Birth: Jun 16, 1970           MRN: [id number] Visit Date: 04/22/2022 Requested by: Benita Stabile, MD [address] [place],  [place] [address] PCP: Benita Stabile, MD  Subjective: Chief Complaint  Patient presents with   Right Shoulder - Pain    HPI: Charles [name] is a 52 y.o. male who presents to the office reporting right shoulder pain.  Patient states that pain began 3 to 4 weeks ago.  Denies any history of injury that he can recall.  Localizes pain to the lateral aspect of the shoulder primarily with aching quality.  Pain is worse with sitting and keeps him from sleeping.  He denies any weakness or numbness/tingling.  Cannot lay on the right shoulder without waking up at night.  He denies any left-sided shoulder symptoms.  He is left-hand dominant.  Does have occasional scapular pain and trapezius pain but most of his pain is localized to the lateral aspect of the shoulder.  No radicular pain down the arm.  He does not really have any neck pain but he does note some neck stiffness.  He does have a history of prior neck surgery according to him.  Also has history of prior right shoulder surgery around the same time where he had bone spurs removed from the right shoulder.  He takes oxycodone 10 mg chronically.  Spends about 20 hours of the day in a fair amount of pain due to his shoulder..                ROS: All systems reviewed are negative as they relate to the chief complaint within the history of present illness.  Patient denies fevers or chills.  Assessment & Plan: Visit Diagnoses:  1. Arthritis of right glenohumeral joint   2. Acute pain of right shoulder     Plan: Patient is a 52 year old male who presents for evaluation of right shoulder pain.  Has had slow insidious onset of pain over the last month that has progressed to the point where his right shoulder is causing him significant discomfort and keeps him from  sleeping at night.  He has pretty much constant pain.  No significant weakness is noted on exam but he does have fair amount of stiffness of the right shoulder relative to the left and pain that is reproduced with passive motion of the shoulder.  He has radiographs taken today demonstrating small inferior humeral head osteophyte which may represent glenohumeral arthritis along with the stiffness that is present in his shoulder.  Does not have any significant evidence of glenohumeral joint space narrowing based on axillary view.  We discussed options available to patient.  He would like to try glenohumeral injection administered under ultrasound guidance.  Tolerated procedure well.  He does have history of prior neck surgery around 2009 or 2010 so if he has pretty much no relief from this injection whatsoever, he may be having some referred pain from the cervical spine though with the asymmetric stiffness he has, likely has some contribution from shoulder pathology.  Follow-up for clinical recheck in 4 to 6 weeks but call if he has no relief from symptoms in the next week or 2 and we can proceed with MRI.  Follow-Up Instructions: No follow-ups on file.   Orders:  Orders Placed This Encounter  Procedures  XR Shoulder Right   US Guided Needle Placement - No Linked Charges   No orders of the defined types were placed in this encounter.     Procedures: Large Joint Inj: R glenohumeral on 04/22/2022 1:13 PM Indications: diagnostic evaluation and pain Details: 22 G 1.5 in and 3.5 in needle, ultrasound-guided posterior approach  Arthrogram: No  Medications: 9 mL bupivacaine 0.5 %; 40 mg methylPREDNISolone acetate 40 MG/ML; 5 mL lidocaine 1 % Outcome: tolerated well, no immediate complications Procedure, treatment alternatives, risks and benefits explained, specific risks discussed. Consent was given by the patient. Immediately prior to procedure a time out was called to verify the correct patient,  procedure, equipment, support staff and site/side marked as required. Patient was prepped and draped in the usual sterile fashion.       Clinical Data: No additional findings.  Objective: Vital Signs: There were no vitals taken for this visit.  Physical Exam:  Constitutional: Patient appears well-developed HEENT:  Head: Normocephalic Eyes:EOM are normal Neck: Normal range of motion Cardiovascular: Normal rate Pulmonary/chest: Effort normal Neurologic: Patient is alert Skin: Skin is warm Psychiatric: Patient has normal mood and affect  Ortho Exam: Ortho exam demonstrates right shoulder with 30 degrees X rotation, 105 degrees abduction, 175 degrees forward elevation passively and actively.  This compared with the left shoulder with 60 degrees external rotation, 120 degrees abduction, 180 degrees forward elevation passively and actively.  He has negative Spurling sign.  Negative Lhermitte sign.  Excellent grip strength, EPL, FPL, finger abduction, Renacet supination, bicep, tricep, deltoid strength.  Axillary nerve intact with deltoid firing.  Does have tenderness over the bicipital groove.  Positive O'Brien sign.  Excellent rotator cuff strength of supra, infra, subscap.  No crepitus noted.  No tenderness throughout the axial cervical spine.  Specialty Comments:  MRI LUMBAR SPINE WITHOUT CONTRAST   TECHNIQUE: Multiplanar, multisequence MR imaging of the lumbar spine was performed. No intravenous contrast was administered.   COMPARISON:  MRI 10/27/2019   FINDINGS: Segmentation:  Standard.   Alignment: Straightening with slight reversal of the lumbar lordosis. Trace retrolisthesis of L5 on S1.   Vertebrae:  No fracture, evidence of discitis, or bone lesion.   Conus medullaris and cauda equina: Conus extends to the L1 level. Conus and cauda equina appear normal.   Paraspinal and other soft tissues: Negative.   Disc levels:   T12-L1: Unremarkable.   L1-L2:  Unremarkable.   L2-L3: Minimal annular disc bulge. No foraminal or canal stenosis. Unchanged.   L3-L4: Minimal annular disc bulge. No foraminal or canal stenosis. Unchanged.   L4-L5: Mild annular disc bulge and endplate osteophytic spurring. Minimal facet hypertrophy. Mild-moderate right and mild left foraminal stenosis. No canal stenosis. No significant interval progression from prior.   L5-S1: Mild annular disc bulge with endplate osteophytic spurring. Mild bilateral facet hypertrophy. Moderate bilateral foraminal stenosis. No canal stenosis. No significant interval progression from prior.   IMPRESSION: 1. Mild degenerative changes of the lumbar spine, not significantly progressed from prior study. 2. 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 3. Mild-moderate right and mild left foraminal stenosis at L4-L5. 4. No canal stenosis at any level.     Electronically Signed   By: Duanne Guess D.O.   On: 10/21/2021 10:39  Imaging: No results found.   PMFS History: Patient Active Problem List   Diagnosis Date Noted   Hypothyroidism 02/11/2022   Asthmatic bronchitis , chronic 02/10/2022   Cigarette smoker 02/10/2022   DOE (dyspnea on exertion) 02/10/2022  History of colonic polyps 05/13/2021   Compression of common peroneal nerve of right lower extremity    Pain in right leg 08/31/2019   Grade I hemorrhoids 03/03/2019   GERD (gastroesophageal reflux disease) 05/25/2018   Dysphagia 05/25/2018   Acute left-sided low back pain with left-sided sciatica 02/01/2018   Pain in left hip 02/01/2018   Constipation 05/11/2017   Low back pain 04/05/2017   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   Bilateral sacroiliitis 05/22/2016   Psoriatic arthritis 05/22/2016   High risk medications (not anticoagulants) long-term use 05/22/2016   DJD (degenerative joint disease), cervical 05/22/2016   Osteoarthritis of spine with radiculopathy, lumbar region 05/22/2016    Cephalalgia 03/16/2016   Chronic daily headache 03/16/2016   Neck pain 03/16/2016   Hemorrhoids 03/13/2016   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   Insomnia 12/26/2015   Abdominal pain, epigastric 11/13/2015   Dark stools 11/13/2015   Rectal bleeding 11/13/2015   Numbness 09/26/2015   Neuropathic pain involving left lateral femoral cutaneous nerve 09/26/2015   Avascular necrosis of left femoral head 07/30/2015   Avascular necrosis of right femoral head 07/30/2015   Psoriasis 07/30/2015   Chronic obstructive pulmonary disease (COPD) (HCC) 07/30/2015   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   Past Medical History:  Diagnosis Date   Arthritis    also has avascular necrosis    Chronic back pain    Chronic neck pain    Chronic shoulder pain    COPD (chronic obstructive pulmonary disease) (HCC)    GERD (gastroesophageal reflux disease)    tums  OTC  medication   Headache    HTN (hypertension)    no longer   Hypothyroidism    Psoriatic arthritis (HCC)    Sleep apnea    tested more than 5 yrs ago...negative results    Family History  Problem Relation Age of Onset   Heart failure Mother    Diabetes Mother    Hypertension Mother    Heart failure Father    Prostate cancer Father    Arthritis Father    Hypertension Sister    Heart attack Sister    Hypercholesterolemia Sister    Healthy Daughter    Colon cancer Neg Hx    Gastric cancer Neg Hx    Esophageal cancer Neg Hx     Past Surgical History:  Procedure Laterality Date   COLONOSCOPY WITH PROPOFOL N/A 12/09/2015   four 5 mm polyps in sigmoid colon and descending colon. Pathology revealed tubular adenomas. Recommended repeat in 5 years.   COLONOSCOPY WITH PROPOFOL N/A 06/20/2021   Procedure: COLONOSCOPY WITH PROPOFOL;  Surgeon: Corbin Ade, MD;  Location: [facility];  Service: Endoscopy;  Laterality: N/A;  1:00pm   ESOPHAGOGASTRODUODENOSCOPY (EGD) WITH PROPOFOL N/A  12/09/2015   Procedure: ESOPHAGOGASTRODUODENOSCOPY (EGD) WITH PROPOFOL;  Surgeon: Corbin Ade, MD;  Location: [facility];  Service: Endoscopy;  Laterality: N/A;   ESOPHAGOGASTRODUODENOSCOPY (EGD) WITH PROPOFOL N/A 06/09/2018   mild erosive reflux esophagitis, non-critical Schatzki's ring s/p dilation   JOINT REPLACEMENT     left hip 2017   MALONEY DILATION N/A 06/09/2018   Procedure: MALONEY DILATION;  Surgeon: Corbin Ade, MD;  Location: [facility];  Service: Endoscopy;  Laterality: N/A;   NECK SURGERY     POLYPECTOMY  06/20/2021   Procedure: POLYPECTOMY;  Surgeon: Corbin Ade, MD;  Location: [facility];  Service: Endoscopy;;   SHOULDER SURGERY  SUPERFICIAL PERONEAL NERVE RELEASE Right 08/19/2020   Procedure: right leg superficial peroneal nerve decompression, lateral compartment release;  Surgeon: Cammy Copa, MD;  Location: [facility];  Service: Orthopedics;  Laterality: Right;   TOTAL HIP ARTHROPLASTY Left 07/30/2015   Procedure: TOTAL HIP ARTHROPLASTY;  Surgeon: Valeria Batman, MD;  Location: [facility] OR;  Service: Orthopedics;  Laterality: Left;   TOTAL HIP ARTHROPLASTY Right 09/22/2016   TOTAL HIP ARTHROPLASTY Right 09/22/2016   Procedure: RIGHT TOTAL HIP ARTHROPLASTY;  Surgeon: Valeria Batman, MD;  Location: [facility] OR;  Service: Orthopedics;  Laterality: Right;   Social History   Occupational History   Not on file  Tobacco Use   Smoking status: Every Day    Packs/day: 0.50    Years: 25.00    Additional pack years: 0.00    Total pack years: 12.50    Types: Cigarettes   Smokeless tobacco: Never  Vaping Use   Vaping Use: Former  Substance and Sexual Activity   Alcohol use: Yes    Comment: social drinking   Drug use: No    Comment: states quit   Sexual activity: Yes

## 2022-05-11 ENCOUNTER — Ambulatory Visit: Payer: Managed Care, Other (non HMO) | Admitting: Internal Medicine

## 2022-06-03 ENCOUNTER — Ambulatory Visit: Payer: Managed Care, Other (non HMO) | Admitting: Surgical

## 2022-09-11 DIAGNOSIS — E039 Hypothyroidism, unspecified: Secondary | ICD-10-CM | POA: Diagnosis not present

## 2022-09-11 DIAGNOSIS — R7303 Prediabetes: Secondary | ICD-10-CM | POA: Diagnosis not present

## 2022-09-11 DIAGNOSIS — E782 Mixed hyperlipidemia: Secondary | ICD-10-CM | POA: Diagnosis not present

## 2022-09-14 ENCOUNTER — Telehealth: Payer: Self-pay | Admitting: Physical Medicine and Rehabilitation

## 2022-09-14 NOTE — Telephone Encounter (Signed)
Pt called in requesting injection in back please advise

## 2022-09-15 ENCOUNTER — Telehealth: Payer: Self-pay | Admitting: Physical Medicine and Rehabilitation

## 2022-09-15 ENCOUNTER — Other Ambulatory Visit: Payer: Self-pay | Admitting: Physical Medicine and Rehabilitation

## 2022-09-15 DIAGNOSIS — M5416 Radiculopathy, lumbar region: Secondary | ICD-10-CM

## 2022-09-15 NOTE — Telephone Encounter (Signed)
See previous encounter

## 2022-09-15 NOTE — Telephone Encounter (Signed)
Spoke with patient and he is requesting an injection. He is having the same type of pain as before. Last injection was 03/19/22 and it lasted until recently with more than 75% relief. No new falls, accidents or injuries. Please advise

## 2022-09-15 NOTE — Telephone Encounter (Signed)
Pt called requesting a call to set an appt for back injection. Please call pt at [phone].

## 2022-09-17 DIAGNOSIS — R7303 Prediabetes: Secondary | ICD-10-CM | POA: Diagnosis not present

## 2022-09-17 DIAGNOSIS — I10 Essential (primary) hypertension: Secondary | ICD-10-CM | POA: Diagnosis not present

## 2022-09-17 DIAGNOSIS — D72829 Elevated white blood cell count, unspecified: Secondary | ICD-10-CM | POA: Diagnosis not present

## 2022-09-17 DIAGNOSIS — K219 Gastro-esophageal reflux disease without esophagitis: Secondary | ICD-10-CM | POA: Diagnosis not present

## 2022-09-17 DIAGNOSIS — E782 Mixed hyperlipidemia: Secondary | ICD-10-CM | POA: Diagnosis not present

## 2022-09-17 DIAGNOSIS — E039 Hypothyroidism, unspecified: Secondary | ICD-10-CM | POA: Diagnosis not present

## 2022-09-17 DIAGNOSIS — M5136 Other intervertebral disc degeneration, lumbar region: Secondary | ICD-10-CM | POA: Diagnosis not present

## 2022-09-24 ENCOUNTER — Telehealth: Payer: Self-pay

## 2022-09-24 NOTE — Telephone Encounter (Signed)
Patient LMVM asking for sooner appt.  I tried calling patient back to advise nothing sooner than scheduled appt on Monday. Patient did not answer.

## 2022-09-28 ENCOUNTER — Ambulatory Visit (INDEPENDENT_AMBULATORY_CARE_PROVIDER_SITE_OTHER): Payer: BC Managed Care – PPO | Admitting: Physical Medicine and Rehabilitation

## 2022-09-28 ENCOUNTER — Other Ambulatory Visit: Payer: Self-pay

## 2022-09-28 VITALS — BP 141/78 | HR 70

## 2022-09-28 DIAGNOSIS — M5416 Radiculopathy, lumbar region: Secondary | ICD-10-CM

## 2022-09-28 MED ORDER — METHYLPREDNISOLONE ACETATE 80 MG/ML IJ SUSP
80.0000 mg | Freq: Once | INTRAMUSCULAR | Status: AC
  Administered 2022-09-28: 80 mg

## 2022-09-28 NOTE — Progress Notes (Signed)
Functional Pain Scale - descriptive words and definitions  Distressing (6)    Pain is present/unable to complete most ADLs limited by pain/sleep is difficult and active distraction is only marginal. Moderate range order  Average Pain 7   +Driver, -BT, -Dye Allergies.  Lower back pain on right side that radiates into the right leg to the ankle

## 2022-09-28 NOTE — Procedures (Signed)
Lumbosacral Transforaminal Epidural Steroid Injection - Sub-Pedicular Approach with Fluoroscopic Guidance  Patient: Charles [name]      Date of Birth: [birth year] MRN: [id number] PCP: Benita Stabile, MD      Visit Date: 09/28/2022   Universal Protocol:    Date/Time: 09/28/2022  Consent Given By: the patient  Position: PRONE  Additional Comments: Vital signs were monitored before and after the procedure. Patient was prepped and draped in the usual sterile fashion. The correct patient, procedure, and site was verified.   Injection Procedure Details:   Procedure diagnoses: Lumbar radiculopathy [M54.16]    Meds Administered:  Meds ordered this encounter  Medications   methylPREDNISolone acetate (DEPO-MEDROL) injection 80 mg    Laterality: Right  Location/Site: L5  Needle:5.0 in., 22 ga.  Short bevel or Quincke spinal needle  Needle Placement: Transforaminal  Findings:    -Comments: Excellent flow of contrast along the nerve, nerve root and into the epidural space.  Procedure Details: After squaring off the end-plates to get a true AP view, the C-arm was positioned so that an oblique view of the foramen as noted above was visualized. The target area is just inferior to the "nose of the scotty dog" or sub pedicular. The soft tissues overlying this structure were infiltrated with 2-3 ml. of 1% Lidocaine without Epinephrine.  The spinal needle was inserted toward the target using a "trajectory" view along the fluoroscope beam.  Under AP and lateral visualization, the needle was advanced so it did not puncture dura and was located close the 6 O'Clock position of the pedical in AP tracterory. Biplanar projections were used to confirm position. Aspiration was confirmed to be negative for CSF and/or blood. A 1-2 ml. volume of Isovue-250 was injected and flow of contrast was noted at each level. Radiographs were obtained for documentation purposes.   After attaining the desired flow of  contrast documented above, a 0.5 to 1.0 ml test dose of 0.25% Marcaine was injected into each respective transforaminal space.  The patient was observed for 90 seconds post injection.  After no sensory deficits were reported, and normal lower extremity motor function was noted,   the above injectate was administered so that equal amounts of the injectate were placed at each foramen (level) into the transforaminal epidural space.   Additional Comments:  No complications occurred Dressing: 2 x 2 sterile gauze and Band-Aid    Post-procedure details: Patient was observed during the procedure. Post-procedure instructions were reviewed.  Patient left the clinic in stable condition.

## 2022-09-28 NOTE — Patient Instructions (Signed)

## 2022-09-28 NOTE — Progress Notes (Signed)
Charles [name] - 52 y.o. male MRN [id number]  Date of birth: 1970-03-05  Office Visit Note: Visit Date: 09/28/2022 PCP: Benita Stabile, MD Referred by: Benita Stabile, MD  Subjective: Chief Complaint  Patient presents with   Lower Back - Pain   HPI:  Charles [name] is a 52 y.o. male who comes in today for planned repeat Right L5-S1  Lumbar Transforaminal epidural steroid injection with fluoroscopic guidance.  The patient has failed conservative care including home exercise, medications, time and activity modification.  This injection will be diagnostic and hopefully therapeutic.  Please see requesting physician notes for further details and justification. Patient received more than 50% pain relief from prior injection.   Referring: Ellin Goodie, FNP and Dr. Willia Craze   ROS Otherwise per HPI.  Assessment & Plan: Visit Diagnoses:    ICD-10-CM   1. Lumbar radiculopathy  M54.16 XR C-ARM NO REPORT    Epidural Steroid injection    methylPREDNISolone acetate (DEPO-MEDROL) injection 80 mg      Plan: No additional findings.   Meds & Orders:  Meds ordered this encounter  Medications   methylPREDNISolone acetate (DEPO-MEDROL) injection 80 mg    Orders Placed This Encounter  Procedures   XR C-ARM NO REPORT   Epidural Steroid injection    Follow-up: Return for visit to requesting provider as needed.   Procedures: No procedures performed  Lumbosacral Transforaminal Epidural Steroid Injection - Sub-Pedicular Approach with Fluoroscopic Guidance  Patient: Charles [name]      Date of Birth: 1970/07/27 MRN: [id number] PCP: Benita Stabile, MD      Visit Date: 09/28/2022   Universal Protocol:    Date/Time: 09/28/2022  Consent Given By: the patient  Position: PRONE  Additional Comments: Vital signs were monitored before and after the procedure. Patient was prepped and draped in the usual sterile fashion. The correct patient, procedure, and site was verified.   Injection  Procedure Details:   Procedure diagnoses: Lumbar radiculopathy [M54.16]    Meds Administered:  Meds ordered this encounter  Medications   methylPREDNISolone acetate (DEPO-MEDROL) injection 80 mg    Laterality: Right  Location/Site: L5  Needle:5.0 in., 22 ga.  Short bevel or Quincke spinal needle  Needle Placement: Transforaminal  Findings:    -Comments: Excellent flow of contrast along the nerve, nerve root and into the epidural space.  Procedure Details: After squaring off the end-plates to get a true AP view, the C-arm was positioned so that an oblique view of the foramen as noted above was visualized. The target area is just inferior to the "nose of the scotty dog" or sub pedicular. The soft tissues overlying this structure were infiltrated with 2-3 ml. of 1% Lidocaine without Epinephrine.  The spinal needle was inserted toward the target using a "trajectory" view along the fluoroscope beam.  Under AP and lateral visualization, the needle was advanced so it did not puncture dura and was located close the 6 O'Clock position of the pedical in AP tracterory. Biplanar projections were used to confirm position. Aspiration was confirmed to be negative for CSF and/or blood. A 1-2 ml. volume of Isovue-250 was injected and flow of contrast was noted at each level. Radiographs were obtained for documentation purposes.   After attaining the desired flow of contrast documented above, a 0.5 to 1.0 ml test dose of 0.25% Marcaine was injected into each respective transforaminal space.  The patient was observed for 90 seconds post injection.  After no sensory deficits  were reported, and normal lower extremity motor function was noted,   the above injectate was administered so that equal amounts of the injectate were placed at each foramen (level) into the transforaminal epidural space.   Additional Comments:  No complications occurred Dressing: 2 x 2 sterile gauze and Band-Aid    Post-procedure  details: Patient was observed during the procedure. Post-procedure instructions were reviewed.  Patient left the clinic in stable condition.    Clinical History: MRI LUMBAR SPINE WITHOUT CONTRAST   TECHNIQUE: Multiplanar, multisequence MR imaging of the lumbar spine was performed. No intravenous contrast was administered.   COMPARISON:  MRI 10/27/2019   FINDINGS: Segmentation:  Standard.   Alignment: Straightening with slight reversal of the lumbar lordosis. Trace retrolisthesis of L5 on S1.   Vertebrae:  No fracture, evidence of discitis, or bone lesion.   Conus medullaris and cauda equina: Conus extends to the L1 level. Conus and cauda equina appear normal.   Paraspinal and other soft tissues: Negative.   Disc levels:   T12-L1: Unremarkable.   L1-L2: Unremarkable.   L2-L3: Minimal annular disc bulge. No foraminal or canal stenosis. Unchanged.   L3-L4: Minimal annular disc bulge. No foraminal or canal stenosis. Unchanged.   L4-L5: Mild annular disc bulge and endplate osteophytic spurring. Minimal facet hypertrophy. Mild-moderate right and mild left foraminal stenosis. No canal stenosis. No significant interval progression from prior.   L5-S1: Mild annular disc bulge with endplate osteophytic spurring. Mild bilateral facet hypertrophy. Moderate bilateral foraminal stenosis. No canal stenosis. No significant interval progression from prior.   IMPRESSION: 1. Mild degenerative changes of the lumbar spine, not significantly progressed from prior study. 2. 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 3. Mild-moderate right and mild left foraminal stenosis at L4-L5. 4. No canal stenosis at any level.     Electronically Signed   By: Duanne Guess D.O.   On: 10/21/2021 10:39     Objective:  VS:  HT:    WT:   BMI:     BP:(!) 141/78  HR:70bpm  TEMP: ( )  RESP:  Physical Exam Vitals and nursing note reviewed.  Constitutional:      General: He is  not in acute distress.    Appearance: Normal appearance. He is not ill-appearing.  HENT:     Head: Normocephalic and atraumatic.     Right Ear: External ear normal.     Left Ear: External ear normal.     Nose: No congestion.  Eyes:     Extraocular Movements: Extraocular movements intact.  Cardiovascular:     Rate and Rhythm: Normal rate.     Pulses: Normal pulses.  Pulmonary:     Effort: Pulmonary effort is normal. No respiratory distress.  Abdominal:     General: There is no distension.     Palpations: Abdomen is soft.  Musculoskeletal:        General: No tenderness or signs of injury.     Cervical back: Neck supple.     Right lower leg: No edema.     Left lower leg: No edema.     Comments: Patient has good distal strength without clonus.  Skin:    Findings: No erythema or rash.  Neurological:     General: No focal deficit present.     Mental Status: He is alert and oriented to person, place, and time.     Sensory: No sensory deficit.     Motor: No weakness or abnormal muscle tone.  Coordination: Coordination normal.  Psychiatric:        Mood and Affect: Mood normal.        Behavior: Behavior normal.      Imaging: No results found.

## 2022-10-16 DIAGNOSIS — D72829 Elevated white blood cell count, unspecified: Secondary | ICD-10-CM | POA: Diagnosis not present

## 2022-11-17 DIAGNOSIS — K429 Umbilical hernia without obstruction or gangrene: Secondary | ICD-10-CM | POA: Diagnosis not present

## 2022-12-08 ENCOUNTER — Ambulatory Visit: Payer: BC Managed Care – PPO | Admitting: General Surgery

## 2022-12-08 ENCOUNTER — Encounter: Payer: Self-pay | Admitting: General Surgery

## 2022-12-08 VITALS — BP 150/76 | HR 66 | Temp 97.7°F | Resp 16 | Ht 72.0 in | Wt 196.0 lb

## 2022-12-08 DIAGNOSIS — K439 Ventral hernia without obstruction or gangrene: Secondary | ICD-10-CM | POA: Diagnosis not present

## 2022-12-08 NOTE — H&P (Signed)
Charles [name]; [id number]; 1970-03-04   HPI Patient is a 52 year old white male who was referred to my care by Dr. Nita Sells for evaluation and treatment of a periumbilical hernia.  Patient states he developed the hernia earlier this year when he was lifting something heavy.  When he coughs or lifts something heavy, he develops pain and swelling just above the umbilicus.  No nausea or vomiting have been noted. Patient has been followed by pulmonology.  He states his asthma is well-controlled.  No current fever or chills.  No productive cough at the present time. Past Medical History:  Diagnosis Date   Arthritis    also has avascular necrosis    Chronic back pain    Chronic neck pain    Chronic shoulder pain    COPD (chronic obstructive pulmonary disease) (HCC)    GERD (gastroesophageal reflux disease)    tums  OTC  medication   Headache    HTN (hypertension)    no longer   Hypothyroidism    Psoriatic arthritis (HCC)    Sleep apnea    tested more than 5 yrs ago...negative results    Past Surgical History:  Procedure Laterality Date   COLONOSCOPY WITH PROPOFOL N/A 12/09/2015   four 5 mm polyps in sigmoid colon and descending colon. Pathology revealed tubular adenomas. Recommended repeat in 5 years.   COLONOSCOPY WITH PROPOFOL N/A 06/20/2021   Procedure: COLONOSCOPY WITH PROPOFOL;  Surgeon: Corbin Ade, MD;  Location: [facility];  Service: Endoscopy;  Laterality: N/A;  1:00pm   ESOPHAGOGASTRODUODENOSCOPY (EGD) WITH PROPOFOL N/A 12/09/2015   Procedure: ESOPHAGOGASTRODUODENOSCOPY (EGD) WITH PROPOFOL;  Surgeon: Corbin Ade, MD;  Location: [facility];  Service: Endoscopy;  Laterality: N/A;   ESOPHAGOGASTRODUODENOSCOPY (EGD) WITH PROPOFOL N/A 06/09/2018   mild erosive reflux esophagitis, non-critical Schatzki's ring s/p dilation   JOINT REPLACEMENT     left hip 2017   MALONEY DILATION N/A 06/09/2018   Procedure: MALONEY DILATION;  Surgeon: Corbin Ade, MD;  [facility]: [facility];  Service: Endoscopy;  Laterality: N/A;   NECK SURGERY     POLYPECTOMY  06/20/2021   Procedure: POLYPECTOMY;  Surgeon: Corbin Ade, MD;  Location: [facility];  Service: Endoscopy;;   SHOULDER SURGERY     SUPERFICIAL PERONEAL NERVE RELEASE Right 08/19/2020   Procedure: right leg superficial peroneal nerve decompression, lateral compartment release;  Surgeon: Cammy Copa, MD;  Location: [facility] OR;  Service: Orthopedics;  Laterality: Right;   TOTAL HIP ARTHROPLASTY Left 07/30/2015   Procedure: TOTAL HIP ARTHROPLASTY;  Surgeon: Valeria Batman, MD;  Location: [facility] OR;  Service: Orthopedics;  Laterality: Left;   TOTAL HIP ARTHROPLASTY Right 09/22/2016   TOTAL HIP ARTHROPLASTY Right 09/22/2016   Procedure: RIGHT TOTAL HIP ARTHROPLASTY;  Surgeon: Valeria Batman, MD;  Location: [facility] OR;  Service: Orthopedics;  Laterality: Right;    Family History  Problem Relation Age of Onset   Heart failure Mother    Diabetes Mother    Hypertension Mother    Heart failure Father    Prostate cancer Father    Arthritis Father    Hypertension Sister    Heart attack Sister    Hypercholesterolemia Sister    Healthy Daughter    Colon cancer Neg Hx    Gastric cancer Neg Hx    Esophageal cancer Neg Hx     Current Outpatient Medications on File Prior to Visit  Medication Sig Dispense Refill   Albuterol-Budesonide (AIRSUPRA) 90-80  MCG/ACT AERO Inhale 2 puffs into the lungs every 4 (four) hours as needed. 10.7 g 3   amLODipine (NORVASC) 10 MG tablet Take 10 mg by mouth daily.     levothyroxine (SYNTHROID) 88 MCG tablet Take 88 mcg by mouth daily before breakfast.     oxyCODONE-acetaminophen (PERCOCET) 10-325 MG tablet Take 1 tablet by mouth every 4 (four) hours as needed for pain. 20 tablet 0   pantoprazole (PROTONIX) 40 MG tablet Take 1 tablet (40 mg total) by mouth daily. 30 minutes before breakfast (Patient taking differently: Take 40 mg by mouth daily as needed (acid reflux). 30 minutes before  breakfast) 90 tablet 3   meloxicam (MOBIC) 7.5 MG tablet Take 1 tablet (7.5 mg total) by mouth daily. (Patient not taking: Reported on 12/08/2022) 30 tablet 0   olmesartan (BENICAR) 40 MG tablet Take 40 mg by mouth daily. (Patient not taking: Reported on 12/08/2022)     No current facility-administered medications on file prior to visit.    No Known Allergies  Social History   Substance and Sexual Activity  Alcohol Use Yes   Comment: social drinking    Social History   Tobacco Use  Smoking Status Every Day   Current packs/day: 0.50   Average packs/day: 0.5 packs/day for 25.0 years (12.5 ttl pk-yrs)   Types: Cigarettes  Smokeless Tobacco Never    Review of Systems  Constitutional: Negative.   HENT: Negative.    Eyes:  Positive for blurred vision.  Respiratory: Negative.    Cardiovascular: Negative.   Gastrointestinal:  Positive for abdominal pain.  Genitourinary: Negative.   Musculoskeletal:  Positive for back pain, joint pain and neck pain.  Skin: Negative.   Neurological: Negative.   Endo/Heme/Allergies: Negative.   Psychiatric/Behavioral: Negative.      Objective   Vitals:   12/08/22 0933  BP: (!) 150/76  Pulse: 66  Resp: 16  Temp: 97.7 F (36.5 C)  SpO2: 97%    Physical Exam Vitals reviewed.  Constitutional:      Appearance: Normal appearance. He is normal weight. He is not ill-appearing.  HENT:     Head: Normocephalic and atraumatic.  Cardiovascular:     Rate and Rhythm: Normal rate and regular rhythm.     Heart sounds: Normal heart sounds. No murmur heard.    No friction rub. No gallop.  Pulmonary:     Effort: Pulmonary effort is normal. No respiratory distress.     Breath sounds: Normal breath sounds. No stridor. No wheezing, rhonchi or rales.  Abdominal:     General: Abdomen is flat. Bowel sounds are normal. There is no distension.     Palpations: Abdomen is soft. There is no mass.     Tenderness: There is no abdominal tenderness. There is no  guarding or rebound.     Hernia: A hernia is present.     Comments: Greater than 3 cm supraumbilical hernia which is easily reducible.  Skin:    General: Skin is warm and dry.  Neurological:     Mental Status: He is alert and oriented to person, place, and time.    Primary care notes reviewed Pulmonology notes reviewed Assessment  Ventral hernia Plan  Patient is scheduled for robotic assisted laparoscopic ventral herniorrhaphy with mesh on 12/15/2022.  The risks and benefits of the procedure including bleeding, infection, mesh use, the possibility of an open procedure, and the possibility of recurrence of the hernia were fully explained to the patient, who gave informed consent.

## 2022-12-08 NOTE — Progress Notes (Signed)
Charles [name]; [id number]; 1970-03-04   HPI Patient is a 52 year old white male who was referred to my care by Dr. Nita Sells for evaluation and treatment of a periumbilical hernia.  Patient states he developed the hernia earlier this year when he was lifting something heavy.  When he coughs or lifts something heavy, he develops pain and swelling just above the umbilicus.  No nausea or vomiting have been noted. Patient has been followed by pulmonology.  He states his asthma is well-controlled.  No current fever or chills.  No productive cough at the present time. Past Medical History:  Diagnosis Date   Arthritis    also has avascular necrosis    Chronic back pain    Chronic neck pain    Chronic shoulder pain    COPD (chronic obstructive pulmonary disease) (HCC)    GERD (gastroesophageal reflux disease)    tums  OTC  medication   Headache    HTN (hypertension)    no longer   Hypothyroidism    Psoriatic arthritis (HCC)    Sleep apnea    tested more than 5 yrs ago...negative results    Past Surgical History:  Procedure Laterality Date   COLONOSCOPY WITH PROPOFOL N/A 12/09/2015   four 5 mm polyps in sigmoid colon and descending colon. Pathology revealed tubular adenomas. Recommended repeat in 5 years.   COLONOSCOPY WITH PROPOFOL N/A 06/20/2021   Procedure: COLONOSCOPY WITH PROPOFOL;  Surgeon: Corbin Ade, MD;  Location: [facility];  Service: Endoscopy;  Laterality: N/A;  1:00pm   ESOPHAGOGASTRODUODENOSCOPY (EGD) WITH PROPOFOL N/A 12/09/2015   Procedure: ESOPHAGOGASTRODUODENOSCOPY (EGD) WITH PROPOFOL;  Surgeon: Corbin Ade, MD;  Location: [facility];  Service: Endoscopy;  Laterality: N/A;   ESOPHAGOGASTRODUODENOSCOPY (EGD) WITH PROPOFOL N/A 06/09/2018   mild erosive reflux esophagitis, non-critical Schatzki's ring s/p dilation   JOINT REPLACEMENT     left hip 2017   MALONEY DILATION N/A 06/09/2018   Procedure: MALONEY DILATION;  Surgeon: Corbin Ade, MD;  [facility];  Service: Endoscopy;  Laterality: N/A;   NECK SURGERY     POLYPECTOMY  06/20/2021   Procedure: POLYPECTOMY;  Surgeon: Corbin Ade, MD;  [facility]: [facility];  Service: Endoscopy;;   SHOULDER SURGERY     SUPERFICIAL PERONEAL NERVE RELEASE Right 08/19/2020   Procedure: right leg superficial peroneal nerve decompression, lateral compartment release;  Surgeon: Cammy Copa, MD;  Location: [facility] OR;  Service: Orthopedics;  Laterality: Right;   TOTAL HIP ARTHROPLASTY Left 07/30/2015   Procedure: TOTAL HIP ARTHROPLASTY;  Surgeon: Valeria Batman, MD;  Location: [facility] OR;  Service: Orthopedics;  Laterality: Left;   TOTAL HIP ARTHROPLASTY Right 09/22/2016   TOTAL HIP ARTHROPLASTY Right 09/22/2016   Procedure: RIGHT TOTAL HIP ARTHROPLASTY;  Surgeon: Valeria Batman, MD;  Location: [facility] OR;  Service: Orthopedics;  Laterality: Right;    Family History  Problem Relation Age of Onset   Heart failure Mother    Diabetes Mother    Hypertension Mother    Heart failure Father    Prostate cancer Father    Arthritis Father    Hypertension Sister    Heart attack Sister    Hypercholesterolemia Sister    Healthy Daughter    Colon cancer Neg Hx    Gastric cancer Neg Hx    Esophageal cancer Neg Hx     Current Outpatient Medications on File Prior to Visit  Medication Sig Dispense Refill   Albuterol-Budesonide (AIRSUPRA) 90-80  MCG/ACT AERO Inhale 2 puffs into the lungs every 4 (four) hours as needed. 10.7 g 3   amLODipine (NORVASC) 10 MG tablet Take 10 mg by mouth daily.     levothyroxine (SYNTHROID) 88 MCG tablet Take 88 mcg by mouth daily before breakfast.     oxyCODONE-acetaminophen (PERCOCET) 10-325 MG tablet Take 1 tablet by mouth every 4 (four) hours as needed for pain. 20 tablet 0   pantoprazole (PROTONIX) 40 MG tablet Take 1 tablet (40 mg total) by mouth daily. 30 minutes before breakfast (Patient taking differently: Take 40 mg by mouth daily as needed (acid reflux). 30 minutes before  breakfast) 90 tablet 3   meloxicam (MOBIC) 7.5 MG tablet Take 1 tablet (7.5 mg total) by mouth daily. (Patient not taking: Reported on 12/08/2022) 30 tablet 0   olmesartan (BENICAR) 40 MG tablet Take 40 mg by mouth daily. (Patient not taking: Reported on 12/08/2022)     No current facility-administered medications on file prior to visit.    No Known Allergies  Social History   Substance and Sexual Activity  Alcohol Use Yes   Comment: social drinking    Social History   Tobacco Use  Smoking Status Every Day   Current packs/day: 0.50   Average packs/day: 0.5 packs/day for 25.0 years (12.5 ttl pk-yrs)   Types: Cigarettes  Smokeless Tobacco Never    Review of Systems  Constitutional: Negative.   HENT: Negative.    Eyes:  Positive for blurred vision.  Respiratory: Negative.    Cardiovascular: Negative.   Gastrointestinal:  Positive for abdominal pain.  Genitourinary: Negative.   Musculoskeletal:  Positive for back pain, joint pain and neck pain.  Skin: Negative.   Neurological: Negative.   Endo/Heme/Allergies: Negative.   Psychiatric/Behavioral: Negative.      Objective   Vitals:   12/08/22 0933  BP: (!) 150/76  Pulse: 66  Resp: 16  Temp: 97.7 F (36.5 C)  SpO2: 97%    Physical Exam Vitals reviewed.  Constitutional:      Appearance: Normal appearance. He is normal weight. He is not ill-appearing.  HENT:     Head: Normocephalic and atraumatic.  Cardiovascular:     Rate and Rhythm: Normal rate and regular rhythm.     Heart sounds: Normal heart sounds. No murmur heard.    No friction rub. No gallop.  Pulmonary:     Effort: Pulmonary effort is normal. No respiratory distress.     Breath sounds: Normal breath sounds. No stridor. No wheezing, rhonchi or rales.  Abdominal:     General: Abdomen is flat. Bowel sounds are normal. There is no distension.     Palpations: Abdomen is soft. There is no mass.     Tenderness: There is no abdominal tenderness. There is no  guarding or rebound.     Hernia: A hernia is present.     Comments: Greater than 3 cm supraumbilical hernia which is easily reducible.  Skin:    General: Skin is warm and dry.  Neurological:     Mental Status: He is alert and oriented to person, place, and time.    Primary care notes reviewed Pulmonology notes reviewed Assessment  Ventral hernia Plan  Patient is scheduled for robotic assisted laparoscopic ventral herniorrhaphy with mesh on 12/15/2022.  The risks and benefits of the procedure including bleeding, infection, mesh use, the possibility of an open procedure, and the possibility of recurrence of the hernia were fully explained to the patient, who gave informed consent.

## 2022-12-10 NOTE — Patient Instructions (Signed)
Charles [name]  12/10/2022     @PREFPERIOPPHARMACY @   Your procedure is scheduled on  12/15/2022.   Report to [facility] at  0600  A.M.   Call this number if you have problems the morning of surgery:  [phone]  If you experience any cold or flu symptoms such as cough, fever, chills, shortness of breath, etc. between now and your scheduled surgery, please notify us at the above number.   Remember:        Use your inhaler before you come and bring you rescue inhaler with you.   Do not eat after midnight.   You may drink clear liquids until 0330 am on 12/15/2022.    Clear liquids allowed are:                    Water, Juice (No red color; non-citric and without pulp; diabetics please choose diet or no sugar options), Carbonated beverages (diabetics please choose diet or no sugar options), Clear Tea (No creamer, milk, or cream, including half & half and powdered creamer), Black Coffee Only (No creamer, milk or cream, including half & half and powdered creamer), and Clear Sports drink (No red color; diabetics please choose diet or no sugar options)    Take these medicines the morning of surgery with A SIP OF WATER                     levothyroxine, oxycodone(if needed).     Do not wear jewelry, make-up or nail polish, including gel polish,  artificial nails, or any other type of covering on natural nails (fingers and  toes).  Do not wear lotions, powders, or perfumes, or deodorant.  Do not shave 48 hours prior to surgery.  Men may shave face and neck.  Do not bring valuables to the hospital.  [facility] is not responsible for any belongings or valuables.  Contacts, dentures or bridgework may not be worn into surgery.  Leave your suitcase in the car.  After surgery it may be brought to your room.  For patients admitted to the hospital, discharge time will be determined by your treatment team.  Patients discharged the day of surgery will not be allowed to drive home  and must have someone with them for 24 hours.    Special instructions:   DO NOT smoke tobacco or vape for 24 hours before your procedure.  Please read over the following fact sheets that you were given. Coughing and Deep Breathing, Surgical Site Infection Prevention, Anesthesia Post-op Instructions, and Care and Recovery After Surgery       Laparoscopic Ventral Hernia Repair, Care After The following information offers guidance on how to care for yourself after your procedure. Your health care provider may also give you more specific instructions. If you have problems or questions, contact your health care provider. What can I expect after the procedure? After the procedure, it is common to have pain, discomfort, or soreness. Follow these instructions at home: Medicines Take over-the-counter and prescription medicines only as told by your health care provider. Ask your health care provider if the medicine prescribed to you: Requires you to avoid driving or using machinery. Can cause constipation. You may need to take these actions to prevent or treat constipation: Drink enough fluid to keep your urine pale yellow. Take over-the-counter or prescription medicines. Eat foods that are high in fiber, such as beans, whole grains, and fresh fruits  and vegetables. Limit foods that are high in fat and processed sugars, such as fried or sweet foods. Incision care  Follow instructions from your health care provider about how to take care of your incisions. Make sure you: Wash your hands with soap and water for at least 20 seconds before and after you change your bandage (dressing) or before you touch your abdomen. If soap and water are not available, use hand sanitizer. Change your dressing as told by your health care provider. Leave stitches (sutures), skin glue, or adhesive strips in place. These skin closures may need to stay in place for 2 weeks or longer. If adhesive strip edges start to  loosen and curl up, you may trim the loose edges. Do not remove adhesive strips completely unless your health care provider tells you to do that. Check your incision areas every day for signs of infection. Check for: More redness, swelling, or pain. Fluid or blood. Warmth. Pus or a bad smell. Bathing  Do not take baths, swim, or use a hot tub until your health care provider approves. Ask your health care provider if you may take showers. You may only be allowed to take sponge baths. Keep your dressing dry until your health care provider says it can be removed. Activity  Rest as told by your health care provider. Avoid sitting for a long time without moving. Get up to take short walks every 1-2 hours. This is important to improve blood flow and breathing. Ask for help if you feel weak or unsteady. Do not lift anything that is heavier than 10 lb (4.5 kg), or the limit that you are told, until your health care provider says that it is safe. If you were given a sedative during the procedure, it can affect you for several hours. Do not drive or operate machinery until your health care provider says that it is safe. Return to your normal activities as told by your health care provider. Ask your health care provider what activities are safe for you. General instructions  Hold a pillow over your abdomen when you cough or sneeze. This helps with pain. Do not use any products that contain nicotine or tobacco. These products include cigarettes, chewing tobacco, and vaping devices, such as e-cigarettes. These can delay healing after surgery. If you need help quitting, ask your health care provider. You may be asked to continue to do deep breathing exercises at home. This will help to prevent a lung infection. Keep all follow-up visits. This is important. Contact a health care provider if: You have any of these signs of infection: More redness, swelling, or pain around an incision. Fluid or blood coming  from an incision. Warmth coming from an incision. Pus or a bad smell coming from an incision. A fever or chills. You have pain that gets worse or does not get better with medicine. You have nausea or vomiting. You have a cough. You have shortness of breath. You have not had a bowel movement in 3 days. You are not able to urinate. Get help right away if you have: Severe pain in your abdomen. Persistent nausea and vomiting. Redness, warmth, or pain in your leg. Chest pain. Trouble breathing. These symptoms may represent a serious problem that is an emergency. Do not wait to see if the symptoms will go away. Get medical help right away. Call your local emergency services (911 in the [place] Do not drive yourself to the hospital. Summary After this procedure, it is common  to have pain, discomfort, or soreness. Follow instructions from your health care provider about how to take care of your incision. Check your incision area every day for signs of infection. Report any signs of infection to your health care provider. Keep all follow-up visits. This is important. This information is not intended to replace advice given to you by your health care provider. Make sure you discuss any questions you have with your health care provider. Document Revised: 08/11/2019 Document Reviewed: 08/11/2019 Elsevier Patient Education  2024 [employer] General Anesthesia, Adult, Care After The following information offers guidance on how to care for yourself after your procedure. Your health care provider may also give you more specific instructions. If you have problems or questions, contact your health care provider. What can I expect after the procedure? After the procedure, it is common for people to: Have pain or discomfort at the IV site. Have nausea or vomiting. Have a sore throat or hoarseness. Have trouble concentrating. Feel cold or chills. Feel weak, sleepy, or tired (fatigue). Have soreness  and body aches. These can affect parts of the body that were not involved in surgery. Follow these instructions at home: For the time period you were told by your health care provider:  Rest. Do not participate in activities where you could fall or become injured. Do not drive or use machinery. Do not drink alcohol. Do not take sleeping pills or medicines that cause drowsiness. Do not make important decisions or sign legal documents. Do not take care of children on your own. General instructions Drink enough fluid to keep your urine pale yellow. If you have sleep apnea, surgery and certain medicines can increase your risk for breathing problems. Follow instructions from your health care provider about wearing your sleep device: Anytime you are sleeping, including during daytime naps. While taking prescription pain medicines, sleeping medicines, or medicines that make you drowsy. Return to your normal activities as told by your health care provider. Ask your health care provider what activities are safe for you. Take over-the-counter and prescription medicines only as told by your health care provider. Do not use any products that contain nicotine or tobacco. These products include cigarettes, chewing tobacco, and vaping devices, such as e-cigarettes. These can delay incision healing after surgery. If you need help quitting, ask your health care provider. Contact a health care provider if: You have nausea or vomiting that does not get better with medicine. You vomit every time you eat or drink. You have pain that does not get better with medicine. You cannot urinate or have bloody urine. You develop a skin rash. You have a fever. Get help right away if: You have trouble breathing. You have chest pain. You vomit blood. These symptoms may be an emergency. Get help right away. Call 911. Do not wait to see if the symptoms will go away. Do not drive yourself to the hospital. Summary After  the procedure, it is common to have a sore throat, hoarseness, nausea, vomiting, or to feel weak, sleepy, or fatigue. For the time period you were told by your health care provider, do not drive or use machinery. Get help right away if you have difficulty breathing, have chest pain, or vomit blood. These symptoms may be an emergency. This information is not intended to replace advice given to you by your health care provider. Make sure you discuss any questions you have with your health care provider. Document Revised: 03/21/2021 Document Reviewed: 03/21/2021 Elsevier Patient Education  2024 [employer] How to Use Chlorhexidine Before Surgery Chlorhexidine gluconate (CHG) is a germ-killing (antiseptic) solution that is used to clean the skin. It can get rid of the bacteria that normally live on the skin and can keep them away for about 24 hours. To clean your skin with CHG, you may be given: A CHG solution to use in the shower or as part of a sponge bath. A prepackaged cloth that contains CHG. Cleaning your skin with CHG may help lower the risk for infection: While you are staying in the intensive care unit of the hospital. If you have a vascular access, such as a central line, to provide short-term or long-term access to your veins. If you have a catheter to drain urine from your bladder. If you are on a ventilator. A ventilator is a machine that helps you breathe by moving air in and out of your lungs. After surgery. What are the risks? Risks of using CHG include: A skin reaction. Hearing loss, if CHG gets in your ears and you have a perforated eardrum. Eye injury, if CHG gets in your eyes and is not rinsed out. The CHG product catching fire. Make sure that you avoid smoking and flames after applying CHG to your skin. Do not use CHG: If you have a chlorhexidine allergy or have previously reacted to chlorhexidine. On babies younger than 25 months of age. How to use CHG solution Use CHG  only as told by your health care provider, and follow the instructions on the label. Use the full amount of CHG as directed. Usually, this is one bottle. During a shower Follow these steps when using CHG solution during a shower (unless your health care provider gives you different instructions): Start the shower. Use your normal soap and shampoo to wash your face and hair. Turn off the shower or move out of the shower stream. Pour the CHG onto a clean washcloth. Do not use any type of brush or rough-edged sponge. Starting at your neck, lather your body down to your toes. Make sure you follow these instructions: If you will be having surgery, pay special attention to the part of your body where you will be having surgery. Scrub this area for at least 1 minute. Do not use CHG on your head or face. If the solution gets into your ears or eyes, rinse them well with water. Avoid your genital area. Avoid any areas of skin that have broken skin, cuts, or scrapes. Scrub your back and under your arms. Make sure to wash skin folds. Let the lather sit on your skin for 1-2 minutes or as long as told by your health care provider. Thoroughly rinse your entire body in the shower. Make sure that all body creases and crevices are rinsed well. Dry off with a clean towel. Do not put any substances on your body afterward--such as powder, lotion, or perfume--unless you are told to do so by your health care provider. Only use lotions that are recommended by the manufacturer. Put on clean clothes or pajamas. If it is the night before your surgery, sleep in clean sheets.  During a sponge bath Follow these steps when using CHG solution during a sponge bath (unless your health care provider gives you different instructions): Use your normal soap and shampoo to wash your face and hair. Pour the CHG onto a clean washcloth. Starting at your neck, lather your body down to your toes. Make sure you follow these  instructions: If you  will be having surgery, pay special attention to the part of your body where you will be having surgery. Scrub this area for at least 1 minute. Do not use CHG on your head or face. If the solution gets into your ears or eyes, rinse them well with water. Avoid your genital area. Avoid any areas of skin that have broken skin, cuts, or scrapes. Scrub your back and under your arms. Make sure to wash skin folds. Let the lather sit on your skin for 1-2 minutes or as long as told by your health care provider. Using a different clean, wet washcloth, thoroughly rinse your entire body. Make sure that all body creases and crevices are rinsed well. Dry off with a clean towel. Do not put any substances on your body afterward--such as powder, lotion, or perfume--unless you are told to do so by your health care provider. Only use lotions that are recommended by the manufacturer. Put on clean clothes or pajamas. If it is the night before your surgery, sleep in clean sheets. How to use CHG prepackaged cloths Only use CHG cloths as told by your health care provider, and follow the instructions on the label. Use the CHG cloth on clean, dry skin. Do not use the CHG cloth on your head or face unless your health care provider tells you to. When washing with the CHG cloth: Avoid your genital area. Avoid any areas of skin that have broken skin, cuts, or scrapes. Before surgery Follow these steps when using a CHG cloth to clean before surgery (unless your health care provider gives you different instructions): Using the CHG cloth, vigorously scrub the part of your body where you will be having surgery. Scrub using a back-and-forth motion for 3 minutes. The area on your body should be completely wet with CHG when you are done scrubbing. Do not rinse. Discard the cloth and let the area air-dry. Do not put any substances on the area afterward, such as powder, lotion, or perfume. Put on clean clothes  or pajamas. If it is the night before your surgery, sleep in clean sheets.  For general bathing Follow these steps when using CHG cloths for general bathing (unless your health care provider gives you different instructions). Use a separate CHG cloth for each area of your body. Make sure you wash between any folds of skin and between your fingers and toes. Wash your body in the following order, switching to a new cloth after each step: The front of your neck, shoulders, and chest. Both of your arms, under your arms, and your hands. Your stomach and groin area, avoiding the genitals. Your right leg and foot. Your left leg and foot. The back of your neck, your back, and your buttocks. Do not rinse. Discard the cloth and let the area air-dry. Do not put any substances on your body afterward--such as powder, lotion, or perfume--unless you are told to do so by your health care provider. Only use lotions that are recommended by the manufacturer. Put on clean clothes or pajamas. Contact a health care provider if: Your skin gets irritated after scrubbing. You have questions about using your solution or cloth. You swallow any chlorhexidine. Call your local poison control center ([phone] in the [place] Get help right away if: Your eyes itch badly, or they become very red or swollen. Your skin itches badly and is red or swollen. Your hearing changes. You have trouble seeing. You have swelling or tingling in your mouth or throat. You  have trouble breathing. These symptoms may represent a serious problem that is an emergency. Do not wait to see if the symptoms will go away. Get medical help right away. Call your local emergency services (911 in the [place] Do not drive yourself to the hospital. Summary Chlorhexidine gluconate (CHG) is a germ-killing (antiseptic) solution that is used to clean the skin. Cleaning your skin with CHG may help to lower your risk for infection. You may be given CHG to  use for bathing. It may be in a bottle or in a prepackaged cloth to use on your skin. Carefully follow your health care provider's instructions and the instructions on the product label. Do not use CHG if you have a chlorhexidine allergy. Contact your health care provider if your skin gets irritated after scrubbing. This information is not intended to replace advice given to you by your health care provider. Make sure you discuss any questions you have with your health care provider. Document Revised: 04/21/2021 Document Reviewed: 03/04/2020 Elsevier Patient Education  2023 [employer].

## 2022-12-11 ENCOUNTER — Encounter (HOSPITAL_COMMUNITY): Payer: Self-pay

## 2022-12-11 ENCOUNTER — Other Ambulatory Visit: Payer: Self-pay

## 2022-12-11 ENCOUNTER — Encounter (HOSPITAL_COMMUNITY)
Admission: RE | Admit: 2022-12-11 | Discharge: 2022-12-11 | Disposition: A | Discharge status: Home / Self Care | Payer: BC Managed Care – PPO | Source: Ambulatory Visit | Attending: General Surgery | Admitting: General Surgery

## 2022-12-11 VITALS — BP 150/76 | HR 66 | Temp 97.7°F | Resp 18 | Ht 72.0 in | Wt 196.0 lb

## 2022-12-11 DIAGNOSIS — Z01810 Encounter for preprocedural cardiovascular examination: Secondary | ICD-10-CM | POA: Insufficient documentation

## 2022-12-11 DIAGNOSIS — K439 Ventral hernia without obstruction or gangrene: Secondary | ICD-10-CM | POA: Insufficient documentation

## 2022-12-11 DIAGNOSIS — F17210 Nicotine dependence, cigarettes, uncomplicated: Secondary | ICD-10-CM | POA: Insufficient documentation

## 2022-12-15 ENCOUNTER — Ambulatory Visit (HOSPITAL_COMMUNITY): Payer: BC Managed Care – PPO | Admitting: Anesthesiology

## 2022-12-15 ENCOUNTER — Encounter (HOSPITAL_COMMUNITY)
Admission: RE | Disposition: A | Discharge status: Home / Self Care | Payer: Self-pay | Source: Home / Self Care | Attending: General Surgery

## 2022-12-15 ENCOUNTER — Ambulatory Visit (HOSPITAL_COMMUNITY): Payer: Self-pay | Admitting: Anesthesiology

## 2022-12-15 ENCOUNTER — Encounter (HOSPITAL_COMMUNITY): Payer: Self-pay | Admitting: General Surgery

## 2022-12-15 ENCOUNTER — Ambulatory Visit (HOSPITAL_COMMUNITY)
Admission: RE | Admit: 2022-12-15 | Discharge: 2022-12-15 | Disposition: A | Discharge status: Home / Self Care | Payer: BC Managed Care – PPO | Attending: General Surgery | Admitting: General Surgery

## 2022-12-15 DIAGNOSIS — F17200 Nicotine dependence, unspecified, uncomplicated: Secondary | ICD-10-CM | POA: Diagnosis not present

## 2022-12-15 DIAGNOSIS — J4489 Other specified chronic obstructive pulmonary disease: Secondary | ICD-10-CM | POA: Diagnosis not present

## 2022-12-15 DIAGNOSIS — F17210 Nicotine dependence, cigarettes, uncomplicated: Secondary | ICD-10-CM | POA: Diagnosis not present

## 2022-12-15 DIAGNOSIS — K429 Umbilical hernia without obstruction or gangrene: Secondary | ICD-10-CM | POA: Diagnosis not present

## 2022-12-15 DIAGNOSIS — I10 Essential (primary) hypertension: Secondary | ICD-10-CM | POA: Diagnosis not present

## 2022-12-15 DIAGNOSIS — G4730 Sleep apnea, unspecified: Secondary | ICD-10-CM | POA: Diagnosis not present

## 2022-12-15 DIAGNOSIS — J449 Chronic obstructive pulmonary disease, unspecified: Secondary | ICD-10-CM | POA: Diagnosis not present

## 2022-12-15 DIAGNOSIS — K439 Ventral hernia without obstruction or gangrene: Secondary | ICD-10-CM | POA: Diagnosis not present

## 2022-12-15 HISTORY — PX: XI ROBOTIC ASSISTED VENTRAL HERNIA: SHX6789

## 2022-12-15 SURGERY — REPAIR, HERNIA, VENTRAL, ROBOT-ASSISTED
Anesthesia: General | Site: Abdomen

## 2022-12-15 MED ORDER — ORAL CARE MOUTH RINSE
15.0000 mL | Freq: Once | OROMUCOSAL | Status: AC
Start: 2022-12-15 — End: 2022-12-15

## 2022-12-15 MED ORDER — DEXMEDETOMIDINE HCL IN NACL 80 MCG/20ML IV SOLN
INTRAVENOUS | Status: DC | PRN
  Administered 2022-12-15 (×2): 20 ug via INTRAVENOUS

## 2022-12-15 MED ORDER — FENTANYL CITRATE (PF) 100 MCG/2ML IJ SOLN
INTRAMUSCULAR | Status: AC
  Filled 2022-12-15: qty 2

## 2022-12-15 MED ORDER — ONDANSETRON HCL 4 MG/2ML IJ SOLN: 4.0000 mg | Freq: Once | INTRAMUSCULAR | Status: DC | PRN

## 2022-12-15 MED ORDER — GLYCOPYRROLATE PF 0.2 MG/ML IJ SOSY
PREFILLED_SYRINGE | INTRAMUSCULAR | Status: DC | PRN
  Administered 2022-12-15: .2 mg via INTRAVENOUS

## 2022-12-15 MED ORDER — MIDAZOLAM HCL 2 MG/2ML IJ SOLN
INTRAMUSCULAR | Status: DC | PRN
  Administered 2022-12-15: 2 mg via INTRAVENOUS

## 2022-12-15 MED ORDER — GLYCOPYRROLATE PF 0.2 MG/ML IJ SOSY
PREFILLED_SYRINGE | INTRAMUSCULAR | Status: AC
  Filled 2022-12-15: qty 1

## 2022-12-15 MED ORDER — KETOROLAC TROMETHAMINE 30 MG/ML IJ SOLN
30.0000 mg | Freq: Once | INTRAMUSCULAR | Status: AC
  Administered 2022-12-15: 30 mg via INTRAVENOUS
  Filled 2022-12-15: qty 1

## 2022-12-15 MED ORDER — LACTATED RINGERS IV SOLN: INTRAVENOUS | Status: DC | PRN

## 2022-12-15 MED ORDER — DEXAMETHASONE SODIUM PHOSPHATE 10 MG/ML IJ SOLN
INTRAMUSCULAR | Status: DC | PRN
  Administered 2022-12-15: 10 mg via INTRAVENOUS

## 2022-12-15 MED ORDER — CHLORHEXIDINE GLUCONATE CLOTH 2 % EX PADS: 6.0000 | MEDICATED_PAD | Freq: Once | CUTANEOUS | Status: DC

## 2022-12-15 MED ORDER — DEXAMETHASONE SODIUM PHOSPHATE 10 MG/ML IJ SOLN
INTRAMUSCULAR | Status: AC
  Filled 2022-12-15: qty 1

## 2022-12-15 MED ORDER — PROPOFOL 10 MG/ML IV BOLUS
INTRAVENOUS | Status: AC
  Filled 2022-12-15: qty 20

## 2022-12-15 MED ORDER — CHLORHEXIDINE GLUCONATE 0.12 % MT SOLN: 15.0000 mL | Freq: Once | OROMUCOSAL | Status: AC

## 2022-12-15 MED ORDER — ROCURONIUM BROMIDE 10 MG/ML (PF) SYRINGE
PREFILLED_SYRINGE | INTRAVENOUS | Status: DC | PRN
  Administered 2022-12-15: 10 mg via INTRAVENOUS
  Administered 2022-12-15: 80 mg via INTRAVENOUS
  Administered 2022-12-15: 10 mg via INTRAVENOUS

## 2022-12-15 MED ORDER — FENTANYL CITRATE (PF) 100 MCG/2ML IJ SOLN
INTRAMUSCULAR | Status: DC | PRN
  Administered 2022-12-15 (×4): 50 ug via INTRAVENOUS

## 2022-12-15 MED ORDER — METHOCARBAMOL 750 MG PO TABS: 750.0000 mg | ORAL_TABLET | Freq: Four times a day (QID) | ORAL | 1 refills | Status: DC | PRN

## 2022-12-15 MED ORDER — CEFAZOLIN SODIUM-DEXTROSE 2-4 GM/100ML-% IV SOLN
INTRAVENOUS | Status: AC
  Filled 2022-12-15: qty 100

## 2022-12-15 MED ORDER — HYDROMORPHONE HCL 1 MG/ML IJ SOLN
INTRAMUSCULAR | Status: AC
  Filled 2022-12-15: qty 0.5

## 2022-12-15 MED ORDER — OXYCODONE HCL 5 MG/5ML PO SOLN
5.0000 mg | Freq: Once | ORAL | Status: AC | PRN
Start: 2022-12-15 — End: 2022-12-15

## 2022-12-15 MED ORDER — LIDOCAINE HCL (PF) 2 % IJ SOLN
INTRAMUSCULAR | Status: AC
  Filled 2022-12-15: qty 5

## 2022-12-15 MED ORDER — BUPIVACAINE HCL (PF) 0.5 % IJ SOLN
INTRAMUSCULAR | Status: DC | PRN
  Administered 2022-12-15: 17 mL

## 2022-12-15 MED ORDER — OXYCODONE HCL 5 MG PO TABS
5.0000 mg | ORAL_TABLET | Freq: Once | ORAL | Status: AC | PRN
  Administered 2022-12-15: 5 mg via ORAL
  Filled 2022-12-15: qty 1

## 2022-12-15 MED ORDER — LABETALOL HCL 5 MG/ML IV SOLN
INTRAVENOUS | Status: DC | PRN
  Administered 2022-12-15 (×2): 5 mg via INTRAVENOUS

## 2022-12-15 MED ORDER — CHLORHEXIDINE GLUCONATE 0.12 % MT SOLN
OROMUCOSAL | Status: AC
  Administered 2022-12-15: 15 mL via OROMUCOSAL
  Filled 2022-12-15: qty 15

## 2022-12-15 MED ORDER — LACTATED RINGERS IV SOLN: INTRAVENOUS | Status: DC

## 2022-12-15 MED ORDER — ONDANSETRON HCL 4 MG/2ML IJ SOLN
INTRAMUSCULAR | Status: AC
  Filled 2022-12-15: qty 2

## 2022-12-15 MED ORDER — FENTANYL CITRATE PF 50 MCG/ML IJ SOSY
50.0000 ug | PREFILLED_SYRINGE | Freq: Once | INTRAMUSCULAR | Status: AC
  Administered 2022-12-15: 50 ug via INTRAVENOUS

## 2022-12-15 MED ORDER — ONDANSETRON HCL 4 MG/2ML IJ SOLN
INTRAMUSCULAR | Status: DC | PRN
  Administered 2022-12-15: 4 mg via INTRAVENOUS

## 2022-12-15 MED ORDER — SUGAMMADEX SODIUM 200 MG/2ML IV SOLN
INTRAVENOUS | Status: DC | PRN
  Administered 2022-12-15: 30 mg via INTRAVENOUS
  Administered 2022-12-15: 50 mg via INTRAVENOUS
  Administered 2022-12-15 (×2): 30 mg via INTRAVENOUS
  Administered 2022-12-15: 10 mg via INTRAVENOUS
  Administered 2022-12-15: 50 mg via INTRAVENOUS

## 2022-12-15 MED ORDER — PROPOFOL 10 MG/ML IV BOLUS
INTRAVENOUS | Status: DC | PRN
  Administered 2022-12-15 (×2): 50 mg via INTRAVENOUS
  Administered 2022-12-15: 100 mg via INTRAVENOUS

## 2022-12-15 MED ORDER — ACETAMINOPHEN 10 MG/ML IV SOLN
INTRAVENOUS | Status: AC
Start: 2022-12-15 — End: ?
  Filled 2022-12-15: qty 100

## 2022-12-15 MED ORDER — MIDAZOLAM HCL 2 MG/2ML IJ SOLN
INTRAMUSCULAR | Status: AC
  Filled 2022-12-15: qty 2

## 2022-12-15 MED ORDER — CEFAZOLIN SODIUM-DEXTROSE 2-4 GM/100ML-% IV SOLN
2.0000 g | INTRAVENOUS | Status: AC
Start: 2022-12-15 — End: 2022-12-15
  Administered 2022-12-15: 2 g via INTRAVENOUS

## 2022-12-15 MED ORDER — HYDROMORPHONE HCL 1 MG/ML IJ SOLN
INTRAMUSCULAR | Status: DC | PRN
  Administered 2022-12-15: .5 mg via INTRAVENOUS

## 2022-12-15 MED ORDER — ACETAMINOPHEN 10 MG/ML IV SOLN
INTRAVENOUS | Status: DC | PRN
  Administered 2022-12-15: 1000 mg via INTRAVENOUS

## 2022-12-15 MED ORDER — CHLORHEXIDINE GLUCONATE CLOTH 2 % EX PADS
6.0000 | MEDICATED_PAD | Freq: Once | CUTANEOUS | Status: AC
  Administered 2022-12-15: 2 via TOPICAL

## 2022-12-15 MED ORDER — LABETALOL HCL 5 MG/ML IV SOLN
INTRAVENOUS | Status: AC
  Filled 2022-12-15: qty 4

## 2022-12-15 MED ORDER — HYDROMORPHONE HCL 1 MG/ML IJ SOLN
0.5000 mg | INTRAMUSCULAR | Status: DC | PRN
  Administered 2022-12-15 (×2): 0.5 mg via INTRAVENOUS
  Filled 2022-12-15: qty 0.5

## 2022-12-15 MED ORDER — ROCURONIUM BROMIDE 10 MG/ML (PF) SYRINGE
PREFILLED_SYRINGE | INTRAVENOUS | Status: AC
  Filled 2022-12-15: qty 10

## 2022-12-15 MED ORDER — LIDOCAINE HCL (CARDIAC) PF 100 MG/5ML IV SOSY
PREFILLED_SYRINGE | INTRAVENOUS | Status: DC | PRN
  Administered 2022-12-15: 80 mg via INTRATRACHEAL

## 2022-12-15 MED ORDER — BUPIVACAINE HCL (PF) 0.5 % IJ SOLN
INTRAMUSCULAR | Status: AC
  Filled 2022-12-15: qty 30

## 2022-12-15 MED ORDER — FENTANYL CITRATE PF 50 MCG/ML IJ SOSY
25.0000 ug | PREFILLED_SYRINGE | INTRAMUSCULAR | Status: DC | PRN
  Administered 2022-12-15 (×3): 50 ug via INTRAVENOUS
  Filled 2022-12-15 (×4): qty 1

## 2022-12-15 MED ORDER — 0.9 % SODIUM CHLORIDE (POUR BTL) OPTIME
TOPICAL | Status: DC | PRN
  Administered 2022-12-15: 500 mL

## 2022-12-15 SURGICAL SUPPLY — 43 items
CHLORAPREP W/TINT 26 (MISCELLANEOUS) ×1 IMPLANT
COVER LIGHT HANDLE STERIS (MISCELLANEOUS) ×2 IMPLANT
COVER MAYO STAND XLG (MISCELLANEOUS) ×1 IMPLANT
COVER TIP SHEARS 8 DVNC (MISCELLANEOUS) ×1 IMPLANT
DEFOGGER SCOPE WARMER CLEARIFY (MISCELLANEOUS) IMPLANT
DERMABOND ADVANCED .7 DNX12 (GAUZE/BANDAGES/DRESSINGS) ×1 IMPLANT
DRAPE ARM DVNC X/XI (DISPOSABLE) ×3 IMPLANT
DRAPE COLUMN DVNC XI (DISPOSABLE) ×1 IMPLANT
DRIVER NDL MEGA SUTCUT DVNCXI (INSTRUMENTS) ×1 IMPLANT
DRIVER NDLE MEGA SUTCUT DVNCXI (INSTRUMENTS) ×1
ELECT REM PT RETURN 9FT ADLT (ELECTROSURGICAL) ×1
ELECTRODE REM PT RTRN 9FT ADLT (ELECTROSURGICAL) ×1 IMPLANT
FORCEPS BPLR R/ABLATION 8 DVNC (INSTRUMENTS) ×1 IMPLANT
GLOVE BIOGEL PI IND STRL 7.0 (GLOVE) ×4 IMPLANT
GLOVE BIOGEL PI IND STRL 7.5 (GLOVE) IMPLANT
GLOVE SURG SS PI 7.0 STRL IVOR (GLOVE) IMPLANT
GLOVE SURG SS PI 7.5 STRL IVOR (GLOVE) ×2 IMPLANT
GOWN STRL REUS W/TWL LRG LVL3 (GOWN DISPOSABLE) ×3 IMPLANT
KIT TURNOVER KIT A (KITS) ×1 IMPLANT
MESH VENTRALIGHT ST 4X6IN (Mesh General) IMPLANT
NDL HYPO 21X1.5 SAFETY (NEEDLE) ×1 IMPLANT
NDL INSUFFLATION 14GA 120MM (NEEDLE) ×1 IMPLANT
NEEDLE HYPO 21X1.5 SAFETY (NEEDLE) ×1
NEEDLE INSUFFLATION 14GA 120MM (NEEDLE) ×1
OBTURATOR OPTICAL STND 8 DVNC (TROCAR) ×1
OBTURATOR OPTICALSTD 8 DVNC (TROCAR) ×1 IMPLANT
PACK LAP CHOLE LZT030E (CUSTOM PROCEDURE TRAY) ×1 IMPLANT
PAD ARMBOARD 7.5X6 YLW CONV (MISCELLANEOUS) ×1 IMPLANT
PENCIL HANDSWITCHING (ELECTRODE) ×1 IMPLANT
POSITIONER HEAD 8X9X4 ADT (SOFTGOODS) ×1 IMPLANT
SCISSORS MNPLR CVD DVNC XI (INSTRUMENTS) ×1 IMPLANT
SEAL UNIV 5-12 XI (MISCELLANEOUS) ×3 IMPLANT
SET BASIN LINEN APH (SET/KITS/TRAYS/PACK) ×1 IMPLANT
SET TUBE SMOKE EVAC HIGH FLOW (TUBING) ×1 IMPLANT
SUT MNCRL AB 4-0 PS2 18 (SUTURE) ×2 IMPLANT
SUT STRATAFIX 0 PDS+ CT-2 23 (SUTURE) ×1
SUT V-LOC 90 ABS 3-0 VLT V-20 (SUTURE) ×2 IMPLANT
SUTURE STRATFX 0 PDS+ CT-2 23 (SUTURE) ×1 IMPLANT
SYR 30ML LL (SYRINGE) ×1 IMPLANT
SYS RETRIEVAL 5MM INZII UNIV (BASKET) ×1
SYSTEM RETRIEVL 5MM INZII UNIV (BASKET) IMPLANT
TAPE TRANSPORE STRL 2 31045 (GAUZE/BANDAGES/DRESSINGS) ×2 IMPLANT
WATER STERILE IRR 500ML POUR (IV SOLUTION) ×1 IMPLANT

## 2022-12-15 NOTE — Anesthesia Procedure Notes (Signed)
Procedure Name: Intubation Date/Time: 12/15/2022 7:40 AM  Performed by: Chelsea Aus, CRNAPre-anesthesia Checklist: Patient identified, Emergency Drugs available, Suction available and Patient being monitored Patient Re-evaluated:Patient Re-evaluated prior to induction Oxygen Delivery Method: Circle system utilized Preoxygenation: Pre-oxygenation with 100% oxygen Induction Type: IV induction Ventilation: Mask ventilation without difficulty and Oral airway inserted - appropriate to patient size Laryngoscope Size: Mac and 4 Grade View: Grade I Tube type: Oral Tube size: 7.0 mm Number of attempts: 1 Placement Confirmation: ETT inserted through vocal cords under direct vision, positive ETCO2 and breath sounds checked- equal and bilateral Secured at: 24 cm Tube secured with: Tape Dental Injury: Teeth and Oropharynx as per pre-operative assessment  Comments: 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

## 2022-12-15 NOTE — Anesthesia Postprocedure Evaluation (Signed)
Anesthesia Post Note  Patient: [name]  Procedure(s) Performed: XI ROBOTIC ASSISTED VENTRAL HERNIA W/ MESH (Abdomen)  Patient location during evaluation: Phase II Anesthesia Type: General Level of consciousness: awake Pain management: pain level controlled Vital Signs Assessment: post-procedure vital signs reviewed and stable Respiratory status: spontaneous breathing and respiratory function stable Cardiovascular status: blood pressure returned to baseline and stable Postop Assessment: no headache and no apparent nausea or vomiting Anesthetic complications: no Comments: Late entry   No notable events documented.   Last Vitals:  Vitals:   12/15/22 0637 12/15/22 0934  BP: 129/68 (!) 160/99  Pulse: 72 73  Resp: 14 (!) 24  Temp: 36.7 C 36.6 C  SpO2: 96% 95%    Last Pain:  Vitals:   12/15/22 0637  TempSrc: Oral  PainSc: 5                  Windell Norfolk

## 2022-12-15 NOTE — Interval H&P Note (Signed)
History and Physical Interval Note:  12/15/2022 7:14 AM  Charles [name]  has presented today for surgery, with the diagnosis of VENTRAL HERNIA, GREATER THAN 3 CM.  The various methods of treatment have been discussed with the patient and family. After consideration of risks, benefits and other options for treatment, the patient has consented to  Procedure(s): XI ROBOTIC ASSISTED VENTRAL HERNIA W/ MESH (N/A) as a surgical intervention.  The patient's history has been reviewed, patient examined, no change in status, stable for surgery.  I have reviewed the patient's chart and labs.  Questions were answered to the patient's satisfaction.     Franky Macho

## 2022-12-15 NOTE — Anesthesia Preprocedure Evaluation (Signed)
Anesthesia Evaluation  Patient identified by MRN, date of birth, ID band Patient awake    Reviewed: Allergy & Precautions, H&P , NPO status , Patient's Chart, lab work & pertinent test results, reviewed documented beta blocker date and time   Airway Mallampati: II  TM Distance: >3 FB Neck ROM: full    Dental no notable dental hx.    Pulmonary neg pulmonary ROS, asthma , sleep apnea , COPD, Current Smoker   Pulmonary exam normal breath sounds clear to auscultation       Cardiovascular Exercise Tolerance: Good hypertension, + DOE  negative cardio ROS  Rhythm:regular Rate:Normal     Neuro/Psych  Headaches  Neuromuscular disease negative neurological ROS  negative psych ROS   GI/Hepatic negative GI ROS, Neg liver ROS,GERD  ,,  Endo/Other  negative endocrine ROSHypothyroidism    Renal/GU negative Renal ROS  negative genitourinary   Musculoskeletal   Abdominal   Peds  Hematology negative hematology ROS (+)   Anesthesia Other Findings   Reproductive/Obstetrics negative OB ROS                             Anesthesia Physical Anesthesia Plan  ASA: 3  Anesthesia Plan: General and General ETT   Post-op Pain Management:    Induction:   PONV Risk Score and Plan: Ondansetron  Airway Management Planned:   Additional Equipment:   Intra-op Plan:   Post-operative Plan:   Informed Consent: I have reviewed the patients History and Physical, chart, labs and discussed the procedure including the risks, benefits and alternatives for the proposed anesthesia with the patient or authorized representative who has indicated his/her understanding and acceptance.     Dental Advisory Given  Plan Discussed with: CRNA  Anesthesia Plan Comments:        Anesthesia Quick Evaluation

## 2022-12-15 NOTE — Op Note (Signed)
Patient:  Charles [name]  DOB:  [birth year]  MRN:  [id number]   Preop Diagnosis: Ventral hernia  Postop Diagnosis: Same  Procedure: Robotic assisted laparoscopic ventral herniorrhaphy with mesh  Surgeon: Franky Macho, MD  Anes: General Endotracheal  Indications: Patient is a 52 year old white male who presents with a periumbilical hernia which is symptomatic.  The risks and benefits of the procedure including bleeding, infection, mesh use, and the possibility of recurrence of the hernia were fully explained to the patient, who gave informed consent.  Procedure note: The patient was placed in the supine position.  After induction of general endotracheal anesthesia, the abdomen was prepped and draped using the usual sterile technique with ChloraPrep.  Surgical site confirmation was performed.  An incision was made in the left upper quadrant at Palmer's point.  A Veress needle was introduced into the abdominal cavity and confirmation of placement was done using the saline drop test.  The abdomen was then insufflated to 15 mmHg pressure.  An 8 mm trocar was introduced into the abdominal cavity under direct visualization without difficulty.  Additional 8 mm trocars were placed in the left flank and left lower quadrant regions.  The robot was then docked and targeted.  The patient had an approximate 2 cm umbilical hernia that contained fat.  In addition, there was laxity that extended inferiorly for another 4 cm.  The hernia was reduced.  I elected to proceed with repair of the hernia.  An 0 STRATAFIX running suture was used to close the fascia longitudinally.  A 10 x 15cm Bard Ventralight DualMesh was then fixated to the anterior abdominal wall using a 3-0 running V-Loc sutures.  The robot was then undocked and all air was evacuated from the abdominal cavity prior to removal of trocars.  All wounds were irrigated with normal saline.  All wounds were injected with 0.5% Sensorcaine.  All incisions were  closed using a 4-0 Monocryl subcuticular suture.  Dermabond was applied.  All tape and needle counts were correct at the end of the procedure.  The patient was extubated in the operating room and transferred to PACU in stable condition.  Complications: None  EBL: Minimal  Specimen: None

## 2022-12-15 NOTE — Transfer of Care (Signed)
Immediate Anesthesia Transfer of Care Note  Patient: Charles [name]  Procedure(s) Performed: XI ROBOTIC ASSISTED VENTRAL HERNIA W/ MESH (Abdomen)  Patient Location: PACU  Anesthesia Type:General  Level of Consciousness: awake, alert , and oriented  Airway & Oxygen Therapy: Patient Spontanous Breathing and Patient connected to nasal cannula oxygen  Post-op Assessment: Report given to RN and Post -op Vital signs reviewed and stable  Post vital signs: Reviewed and stable  Last Vitals:  Vitals Value Taken Time  BP 160/99 12/15/22 0934  Temp 36.6 C 12/15/22 0934  Pulse 76 12/15/22 0936  Resp 17 12/15/22 0936  SpO2 95 % 12/15/22 0936  Vitals shown include unfiled device data.  Last Pain:  Vitals:   12/15/22 0637  TempSrc: Oral  PainSc: 5       Patients Stated Pain Goal: 5 (12/15/22 1324)  Complications: No notable events documented.

## 2022-12-22 ENCOUNTER — Encounter: Payer: Self-pay | Admitting: General Surgery

## 2022-12-22 ENCOUNTER — Ambulatory Visit (INDEPENDENT_AMBULATORY_CARE_PROVIDER_SITE_OTHER): Payer: BC Managed Care – PPO | Admitting: General Surgery

## 2022-12-22 VITALS — BP 124/78 | HR 76 | Temp 98.2°F | Resp 14 | Ht 72.0 in | Wt 191.0 lb

## 2022-12-22 DIAGNOSIS — Z9889 Other specified postprocedural states: Secondary | ICD-10-CM | POA: Diagnosis not present

## 2022-12-22 DIAGNOSIS — Z8719 Personal history of other diseases of the digestive system: Secondary | ICD-10-CM

## 2022-12-22 NOTE — Progress Notes (Signed)
Subjective:     Charles [name]  Patient here for postoperative visit, status post robotic assisted laparoscopic ventral herniorrhaphy with mesh.  He is doing well.  His pain has eased up.  He still has some pain pills left.  He is avoiding heavy lifting.  He denies any fever or chills. Objective:    BP 124/78   Pulse 76   Temp 98.2 F (36.8 C) (Oral)   Resp 14   Ht 6' (1.829 m)   Wt 191 lb (86.6 kg)   SpO2 93%   BMI 25.90 kg/m   General:  alert, cooperative, and no distress  Abdomen soft, incisions healing well.  No seroma present.     Assessment:    Doing well postoperatively.    Plan:   Should continue to avoid any significant heavy lifting.  I will see him in the office on 01/12/2023 for follow-up visit.

## 2022-12-23 ENCOUNTER — Encounter (HOSPITAL_COMMUNITY): Payer: Self-pay | Admitting: General Surgery

## 2023-01-04 ENCOUNTER — Telehealth: Payer: Self-pay | Admitting: *Deleted

## 2023-01-04 NOTE — Telephone Encounter (Signed)
Received STD forms from [facility].   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

## 2023-01-12 ENCOUNTER — Encounter: Payer: Self-pay | Admitting: General Surgery

## 2023-01-12 ENCOUNTER — Ambulatory Visit (INDEPENDENT_AMBULATORY_CARE_PROVIDER_SITE_OTHER): Payer: BC Managed Care – PPO | Admitting: General Surgery

## 2023-01-12 VITALS — BP 135/73 | HR 62 | Temp 98.1°F | Resp 16 | Ht 72.0 in | Wt 195.0 lb

## 2023-01-12 DIAGNOSIS — Z9889 Other specified postprocedural states: Secondary | ICD-10-CM

## 2023-01-12 DIAGNOSIS — Z8719 Personal history of other diseases of the digestive system: Secondary | ICD-10-CM

## 2023-01-12 NOTE — Progress Notes (Signed)
 Subjective:     Charles [name]  Patient here for postoperative visit, status post robotic assisted laparoscopic ventral herniorrhaphy with mesh.  He is doing well.  He has no significant incisional pain.  He is increasing his activity as able. Objective:    BP 135/73   Pulse 62   Temp 98.1 F (36.7 C) (Oral)   Resp 16   Ht 6' (1.829 m)   Wt 195 lb (88.5 kg)   SpO2 96%   BMI 26.45 kg/m   General:  alert, cooperative, and no distress  Abdomen soft, incisions healing well.     Assessment:    Doing well postoperatively.    Plan:   May return to work without restrictions on 01/18/2023.  Follow-up here as needed.

## 2023-01-13 ENCOUNTER — Telehealth: Payer: Self-pay | Admitting: Family Medicine

## 2023-01-13 NOTE — Telephone Encounter (Signed)
 STD paperwork completed and faxed to [facility] at [phone]. Confirmation received.   Out of work starting 12/15/2022 and may return unrestricted on 01/18/2023.

## 2023-01-20 ENCOUNTER — Telehealth: Payer: Self-pay | Admitting: Physical Medicine and Rehabilitation

## 2023-01-20 NOTE — Telephone Encounter (Signed)
Patient called. He would like an appointment with Dr. Newton.  

## 2023-01-22 ENCOUNTER — Other Ambulatory Visit: Payer: Self-pay | Admitting: Physical Medicine and Rehabilitation

## 2023-01-22 DIAGNOSIS — M5416 Radiculopathy, lumbar region: Secondary | ICD-10-CM

## 2023-02-08 ENCOUNTER — Other Ambulatory Visit: Payer: Self-pay

## 2023-02-08 ENCOUNTER — Ambulatory Visit (INDEPENDENT_AMBULATORY_CARE_PROVIDER_SITE_OTHER): Payer: BC Managed Care – PPO | Admitting: Physical Medicine and Rehabilitation

## 2023-02-08 DIAGNOSIS — M5416 Radiculopathy, lumbar region: Secondary | ICD-10-CM | POA: Diagnosis not present

## 2023-02-08 MED ORDER — METHYLPREDNISOLONE ACETATE 40 MG/ML IJ SUSP
40.0000 mg | Freq: Once | INTRAMUSCULAR | Status: AC
  Administered 2023-02-08: 40 mg

## 2023-02-08 NOTE — Progress Notes (Signed)
Charles [name] - 53 y.o. male MRN [id number]  Date of birth: October 29, 1970  Office Visit Note: Visit Date: 02/08/2023 PCP: Benita Stabile, MD Referred by: Benita Stabile, MD  Subjective: Chief Complaint  Patient presents with   Lower Back - Pain   HPI:  Charles [name] is a 53 y.o. male who comes in today for planned repeat Right L5-S1  Lumbar Transforaminal epidural steroid injection with fluoroscopic guidance.  The patient has failed conservative care including home exercise, medications, time and activity modification.  This injection will be diagnostic and hopefully therapeutic.  Please see requesting physician notes for further details and justification. Patient received more than 75% pain relief from prior injection.   Referring: Ellin Goodie, FNP and Dr. Marrianne Mood Dean   ROS Otherwise per HPI.  Assessment & Plan: Visit Diagnoses:    ICD-10-CM   1. Lumbar radiculopathy  M54.16 XR C-ARM NO REPORT    Epidural Steroid injection    methylPREDNISolone acetate (DEPO-MEDROL) injection 40 mg      Plan: No additional findings.   Meds & Orders:  Meds ordered this encounter  Medications   methylPREDNISolone acetate (DEPO-MEDROL) injection 40 mg    Orders Placed This Encounter  Procedures   XR C-ARM NO REPORT   Epidural Steroid injection    Follow-up: Return for visit to requesting provider as needed.   Procedures: No procedures performed  Lumbosacral Transforaminal Epidural Steroid Injection - Sub-Pedicular Approach with Fluoroscopic Guidance  Patient: Charles [name]      Date of Birth: 06/29/1970 MRN: [id number] PCP: Benita Stabile, MD      Visit Date: 02/08/2023   Universal Protocol:    Date/Time: 02/08/2023  Consent Given By: the patient  Position: PRONE  Additional Comments: Vital signs were monitored before and after the procedure. Patient was prepped and draped in the usual sterile fashion. The correct patient, procedure, and site was verified.   Injection  Procedure Details:   Procedure diagnoses: Lumbar radiculopathy [M54.16]    Meds Administered:  Meds ordered this encounter  Medications   methylPREDNISolone acetate (DEPO-MEDROL) injection 40 mg    Laterality: Right  Location/Site: L5  Needle:5.0 in., 22 ga.  Short bevel or Quincke spinal needle  Needle Placement: Transforaminal  Findings:    -Comments: Excellent flow of contrast along the nerve, nerve root and into the epidural space.  Procedure Details: After squaring off the end-plates to get a true AP view, the C-arm was positioned so that an oblique view of the foramen as noted above was visualized. The target area is just inferior to the "nose of the scotty dog" or sub pedicular. The soft tissues overlying this structure were infiltrated with 2-3 ml. of 1% Lidocaine without Epinephrine.  The spinal needle was inserted toward the target using a "trajectory" view along the fluoroscope beam.  Under AP and lateral visualization, the needle was advanced so it did not puncture dura and was located close the 6 O'Clock position of the pedical in AP tracterory. Biplanar projections were used to confirm position. Aspiration was confirmed to be negative for CSF and/or blood. A 1-2 ml. volume of Isovue-250 was injected and flow of contrast was noted at each level. Radiographs were obtained for documentation purposes.   After attaining the desired flow of contrast documented above, a 0.5 to 1.0 ml test dose of 0.25% Marcaine was injected into each respective transforaminal space.  The patient was observed for 90 seconds post injection.  After no sensory  deficits were reported, and normal lower extremity motor function was noted,   the above injectate was administered so that equal amounts of the injectate were placed at each foramen (level) into the transforaminal epidural space.   Additional Comments:  No complications occurred Dressing: 2 x 2 sterile gauze and Band-Aid    Post-procedure  details: Patient was observed during the procedure. Post-procedure instructions were reviewed.  Patient left the clinic in stable condition.    Clinical History: MRI LUMBAR SPINE WITHOUT CONTRAST   TECHNIQUE: Multiplanar, multisequence MR imaging of the lumbar spine was performed. No intravenous contrast was administered.   COMPARISON:  MRI 10/27/2019   FINDINGS: Segmentation:  Standard.   Alignment: Straightening with slight reversal of the lumbar lordosis. Trace retrolisthesis of L5 on S1.   Vertebrae:  No fracture, evidence of discitis, or bone lesion.   Conus medullaris and cauda equina: Conus extends to the L1 level. Conus and cauda equina appear normal.   Paraspinal and other soft tissues: Negative.   Disc levels:   T12-L1: Unremarkable.   L1-L2: Unremarkable.   L2-L3: Minimal annular disc bulge. No foraminal or canal stenosis. Unchanged.   L3-L4: Minimal annular disc bulge. No foraminal or canal stenosis. Unchanged.   L4-L5: Mild annular disc bulge and endplate osteophytic spurring. Minimal facet hypertrophy. Mild-moderate right and mild left foraminal stenosis. No canal stenosis. No significant interval progression from prior.   L5-S1: Mild annular disc bulge with endplate osteophytic spurring. Mild bilateral facet hypertrophy. Moderate bilateral foraminal stenosis. No canal stenosis. No significant interval progression from prior.   IMPRESSION: 1. Mild degenerative changes of the lumbar spine, not significantly progressed from prior study. 2. 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 3. Mild-moderate right and mild left foraminal stenosis at L4-L5. 4. No canal stenosis at any level.     Electronically Signed   By: Duanne Guess D.O.   On: 10/21/2021 10:39     Objective:  VS:  HT:    WT:   BMI:     BP:   HR: bpm  TEMP: ( )  RESP:  Physical Exam Vitals and nursing note reviewed.  Constitutional:      General: He is not in  acute distress.    Appearance: Normal appearance. He is not ill-appearing.  HENT:     Head: Normocephalic and atraumatic.     Right Ear: External ear normal.     Left Ear: External ear normal.     Nose: No congestion.  Eyes:     Extraocular Movements: Extraocular movements intact.  Cardiovascular:     Rate and Rhythm: Normal rate.     Pulses: Normal pulses.  Pulmonary:     Effort: Pulmonary effort is normal. No respiratory distress.  Abdominal:     General: There is no distension.     Palpations: Abdomen is soft.  Musculoskeletal:        General: No tenderness or signs of injury.     Cervical back: Neck supple.     Right lower leg: No edema.     Left lower leg: No edema.     Comments: Patient has good distal strength without clonus.  Skin:    Findings: No erythema or rash.  Neurological:     General: No focal deficit present.     Mental Status: He is alert and oriented to person, place, and time.     Sensory: No sensory deficit.     Motor: No weakness or abnormal muscle tone.  Coordination: Coordination normal.  Psychiatric:        Mood and Affect: Mood normal.        Behavior: Behavior normal.      Imaging: No results found.

## 2023-02-08 NOTE — Procedures (Signed)
Lumbosacral Transforaminal Epidural Steroid Injection - Sub-Pedicular Approach with Fluoroscopic Guidance  Patient: Charles [name]      Date of Birth: 09-12-1970 MRN: [id number] PCP: Benita Stabile, MD      Visit Date: 02/08/2023   Universal Protocol:    Date/Time: 02/08/2023  Consent Given By: the patient  Position: PRONE  Additional Comments: Vital signs were monitored before and after the procedure. Patient was prepped and draped in the usual sterile fashion. The correct patient, procedure, and site was verified.   Injection Procedure Details:   Procedure diagnoses: Lumbar radiculopathy [M54.16]    Meds Administered:  Meds ordered this encounter  Medications   methylPREDNISolone acetate (DEPO-MEDROL) injection 40 mg    Laterality: Right  Location/Site: L5  Needle:5.0 in., 22 ga.  Short bevel or Quincke spinal needle  Needle Placement: Transforaminal  Findings:    -Comments: Excellent flow of contrast along the nerve, nerve root and into the epidural space.  Procedure Details: After squaring off the end-plates to get a true AP view, the C-arm was positioned so that an oblique view of the foramen as noted above was visualized. The target area is just inferior to the "nose of the scotty dog" or sub pedicular. The soft tissues overlying this structure were infiltrated with 2-3 ml. of 1% Lidocaine without Epinephrine.  The spinal needle was inserted toward the target using a "trajectory" view along the fluoroscope beam.  Under AP and lateral visualization, the needle was advanced so it did not puncture dura and was located close the 6 O'Clock position of the pedical in AP tracterory. Biplanar projections were used to confirm position. Aspiration was confirmed to be negative for CSF and/or blood. A 1-2 ml. volume of Isovue-250 was injected and flow of contrast was noted at each level. Radiographs were obtained for documentation purposes.   After attaining the desired flow of  contrast documented above, a 0.5 to 1.0 ml test dose of 0.25% Marcaine was injected into each respective transforaminal space.  The patient was observed for 90 seconds post injection.  After no sensory deficits were reported, and normal lower extremity motor function was noted,   the above injectate was administered so that equal amounts of the injectate were placed at each foramen (level) into the transforaminal epidural space.   Additional Comments:  No complications occurred Dressing: 2 x 2 sterile gauze and Band-Aid    Post-procedure details: Patient was observed during the procedure. Post-procedure instructions were reviewed.  Patient left the clinic in stable condition.

## 2023-02-08 NOTE — Progress Notes (Signed)
Functional Pain Scale - descriptive words and definitions  Moderate (4)   Constantly aware of pain, can complete ADLs with modification/sleep marginally affected at times/passive distraction is of no use, but active distraction gives some relief. Moderate range order  Average Pain 5  R > L  Goes all the way down his R leg.  Last injection worked really good he received a lot of relief.   +Driver, -BT, -Dye Allergies.

## 2023-02-08 NOTE — Patient Instructions (Signed)

## 2023-03-09 DIAGNOSIS — F1120 Opioid dependence, uncomplicated: Secondary | ICD-10-CM | POA: Diagnosis not present

## 2023-03-18 ENCOUNTER — Telehealth: Payer: Self-pay | Admitting: Physical Medicine and Rehabilitation

## 2023-03-18 ENCOUNTER — Other Ambulatory Visit: Payer: Self-pay | Admitting: Physical Medicine and Rehabilitation

## 2023-03-18 ENCOUNTER — Telehealth: Payer: Self-pay

## 2023-03-18 DIAGNOSIS — M5416 Radiculopathy, lumbar region: Secondary | ICD-10-CM

## 2023-03-18 NOTE — Telephone Encounter (Signed)
 Last injection sept 24 %90 of relief /function Current pain score--8 Duration of relief/improvement----7 months Current falls / injuries---NO Location of pain/radiation of pain---lower back down to right leg. Call patient at ---[phone]? Leave message and patient will call back-- Patient advised he is not taking any blood thinner.

## 2023-03-18 NOTE — Telephone Encounter (Signed)
 Pt called requesting and appt. Best call back number [phone]

## 2023-03-29 DIAGNOSIS — F1120 Opioid dependence, uncomplicated: Secondary | ICD-10-CM | POA: Diagnosis not present

## 2023-03-30 ENCOUNTER — Telehealth: Payer: Self-pay

## 2023-03-30 DIAGNOSIS — F1120 Opioid dependence, uncomplicated: Secondary | ICD-10-CM | POA: Diagnosis not present

## 2023-03-30 NOTE — Telephone Encounter (Signed)
 Patient advised last injection was 02/2023 %100 relief/function ability Duration of relief/improvement 2 weeks Current pain score--7 Recent falls/injuries---NONE Location of pain--lower down right leg

## 2023-03-31 DIAGNOSIS — F1120 Opioid dependence, uncomplicated: Secondary | ICD-10-CM | POA: Diagnosis not present

## 2023-04-01 DIAGNOSIS — F1120 Opioid dependence, uncomplicated: Secondary | ICD-10-CM | POA: Diagnosis not present

## 2023-04-02 DIAGNOSIS — F1120 Opioid dependence, uncomplicated: Secondary | ICD-10-CM | POA: Diagnosis not present

## 2023-04-05 DIAGNOSIS — F1120 Opioid dependence, uncomplicated: Secondary | ICD-10-CM | POA: Diagnosis not present

## 2023-04-06 DIAGNOSIS — F1120 Opioid dependence, uncomplicated: Secondary | ICD-10-CM | POA: Diagnosis not present

## 2023-04-07 DIAGNOSIS — F1120 Opioid dependence, uncomplicated: Secondary | ICD-10-CM | POA: Diagnosis not present

## 2023-04-09 DIAGNOSIS — F1020 Alcohol dependence, uncomplicated: Secondary | ICD-10-CM | POA: Diagnosis not present

## 2023-04-09 DIAGNOSIS — F1120 Opioid dependence, uncomplicated: Secondary | ICD-10-CM | POA: Diagnosis not present

## 2023-04-09 DIAGNOSIS — F1210 Cannabis abuse, uncomplicated: Secondary | ICD-10-CM | POA: Diagnosis not present

## 2023-04-12 DIAGNOSIS — Z7151 Drug abuse counseling and surveillance of drug abuser: Secondary | ICD-10-CM | POA: Diagnosis not present

## 2023-04-12 DIAGNOSIS — F1120 Opioid dependence, uncomplicated: Secondary | ICD-10-CM | POA: Diagnosis not present

## 2023-04-14 DIAGNOSIS — R7303 Prediabetes: Secondary | ICD-10-CM | POA: Diagnosis not present

## 2023-04-14 DIAGNOSIS — I10 Essential (primary) hypertension: Secondary | ICD-10-CM | POA: Diagnosis not present

## 2023-04-14 DIAGNOSIS — E039 Hypothyroidism, unspecified: Secondary | ICD-10-CM | POA: Diagnosis not present

## 2023-04-14 DIAGNOSIS — F1120 Opioid dependence, uncomplicated: Secondary | ICD-10-CM | POA: Diagnosis not present

## 2023-04-19 DIAGNOSIS — Z7151 Drug abuse counseling and surveillance of drug abuser: Secondary | ICD-10-CM | POA: Diagnosis not present

## 2023-04-19 DIAGNOSIS — F1120 Opioid dependence, uncomplicated: Secondary | ICD-10-CM | POA: Diagnosis not present

## 2023-04-21 DIAGNOSIS — F1120 Opioid dependence, uncomplicated: Secondary | ICD-10-CM | POA: Diagnosis not present

## 2023-04-21 DIAGNOSIS — Z7151 Drug abuse counseling and surveillance of drug abuser: Secondary | ICD-10-CM | POA: Diagnosis not present

## 2023-04-22 DIAGNOSIS — F1120 Opioid dependence, uncomplicated: Secondary | ICD-10-CM | POA: Diagnosis not present

## 2023-04-23 DIAGNOSIS — Z7151 Drug abuse counseling and surveillance of drug abuser: Secondary | ICD-10-CM | POA: Diagnosis not present

## 2023-04-23 DIAGNOSIS — F1120 Opioid dependence, uncomplicated: Secondary | ICD-10-CM | POA: Diagnosis not present

## 2023-04-26 DIAGNOSIS — Z7151 Drug abuse counseling and surveillance of drug abuser: Secondary | ICD-10-CM | POA: Diagnosis not present

## 2023-04-28 DIAGNOSIS — Z7151 Drug abuse counseling and surveillance of drug abuser: Secondary | ICD-10-CM | POA: Diagnosis not present

## 2023-04-28 DIAGNOSIS — F1120 Opioid dependence, uncomplicated: Secondary | ICD-10-CM | POA: Diagnosis not present

## 2023-05-03 DIAGNOSIS — F1120 Opioid dependence, uncomplicated: Secondary | ICD-10-CM | POA: Diagnosis not present

## 2023-05-03 DIAGNOSIS — Z7151 Drug abuse counseling and surveillance of drug abuser: Secondary | ICD-10-CM | POA: Diagnosis not present

## 2023-05-05 DIAGNOSIS — F1120 Opioid dependence, uncomplicated: Secondary | ICD-10-CM | POA: Diagnosis not present

## 2023-05-05 DIAGNOSIS — Z7151 Drug abuse counseling and surveillance of drug abuser: Secondary | ICD-10-CM | POA: Diagnosis not present

## 2023-05-07 DIAGNOSIS — Z7151 Drug abuse counseling and surveillance of drug abuser: Secondary | ICD-10-CM | POA: Diagnosis not present

## 2023-05-07 DIAGNOSIS — F1120 Opioid dependence, uncomplicated: Secondary | ICD-10-CM | POA: Diagnosis not present

## 2023-05-10 DIAGNOSIS — F1120 Opioid dependence, uncomplicated: Secondary | ICD-10-CM | POA: Diagnosis not present

## 2023-05-10 DIAGNOSIS — Z7151 Drug abuse counseling and surveillance of drug abuser: Secondary | ICD-10-CM | POA: Diagnosis not present

## 2023-05-11 DIAGNOSIS — Z7151 Drug abuse counseling and surveillance of drug abuser: Secondary | ICD-10-CM | POA: Diagnosis not present

## 2023-05-11 DIAGNOSIS — F1120 Opioid dependence, uncomplicated: Secondary | ICD-10-CM | POA: Diagnosis not present

## 2023-05-14 DIAGNOSIS — E039 Hypothyroidism, unspecified: Secondary | ICD-10-CM | POA: Diagnosis not present

## 2023-05-14 DIAGNOSIS — Z0000 Encounter for general adult medical examination without abnormal findings: Secondary | ICD-10-CM | POA: Diagnosis not present

## 2023-05-14 DIAGNOSIS — Z23 Encounter for immunization: Secondary | ICD-10-CM | POA: Diagnosis not present

## 2023-05-14 DIAGNOSIS — I10 Essential (primary) hypertension: Secondary | ICD-10-CM | POA: Diagnosis not present

## 2023-05-14 DIAGNOSIS — K219 Gastro-esophageal reflux disease without esophagitis: Secondary | ICD-10-CM | POA: Diagnosis not present

## 2023-05-14 DIAGNOSIS — B001 Herpesviral vesicular dermatitis: Secondary | ICD-10-CM | POA: Diagnosis not present

## 2023-05-14 DIAGNOSIS — Z0001 Encounter for general adult medical examination with abnormal findings: Secondary | ICD-10-CM | POA: Diagnosis not present

## 2023-05-17 DIAGNOSIS — Z7151 Drug abuse counseling and surveillance of drug abuser: Secondary | ICD-10-CM | POA: Diagnosis not present

## 2023-05-17 DIAGNOSIS — F1120 Opioid dependence, uncomplicated: Secondary | ICD-10-CM | POA: Diagnosis not present

## 2023-05-19 DIAGNOSIS — F1120 Opioid dependence, uncomplicated: Secondary | ICD-10-CM | POA: Diagnosis not present

## 2023-05-19 DIAGNOSIS — Z7151 Drug abuse counseling and surveillance of drug abuser: Secondary | ICD-10-CM | POA: Diagnosis not present

## 2023-05-20 ENCOUNTER — Other Ambulatory Visit: Payer: Self-pay

## 2023-05-20 ENCOUNTER — Ambulatory Visit: Admitting: Physical Medicine and Rehabilitation

## 2023-05-20 VITALS — BP 110/77 | HR 87

## 2023-05-20 DIAGNOSIS — M5416 Radiculopathy, lumbar region: Secondary | ICD-10-CM | POA: Diagnosis not present

## 2023-05-20 MED ORDER — METHYLPREDNISOLONE ACETATE 40 MG/ML IJ SUSP
40.0000 mg | Freq: Once | INTRAMUSCULAR | Status: AC
  Administered 2023-05-20: 40 mg

## 2023-05-20 NOTE — Progress Notes (Signed)
 Pain Scale   Average Pain 5 Patient advising he has lower back pain radiating to his right leg, Patient advising that stand increases his pain, sitting does decrease pain.        +Driver, -BT, -Dye Allergies.

## 2023-05-20 NOTE — Patient Instructions (Signed)

## 2023-05-24 DIAGNOSIS — Z7151 Drug abuse counseling and surveillance of drug abuser: Secondary | ICD-10-CM | POA: Diagnosis not present

## 2023-05-24 DIAGNOSIS — F1120 Opioid dependence, uncomplicated: Secondary | ICD-10-CM | POA: Diagnosis not present

## 2023-05-26 DIAGNOSIS — Z7151 Drug abuse counseling and surveillance of drug abuser: Secondary | ICD-10-CM | POA: Diagnosis not present

## 2023-05-26 DIAGNOSIS — F1120 Opioid dependence, uncomplicated: Secondary | ICD-10-CM | POA: Diagnosis not present

## 2023-05-28 DIAGNOSIS — Z7151 Drug abuse counseling and surveillance of drug abuser: Secondary | ICD-10-CM | POA: Diagnosis not present

## 2023-05-28 DIAGNOSIS — E039 Hypothyroidism, unspecified: Secondary | ICD-10-CM | POA: Diagnosis not present

## 2023-05-28 DIAGNOSIS — F1120 Opioid dependence, uncomplicated: Secondary | ICD-10-CM | POA: Diagnosis not present

## 2023-05-31 DIAGNOSIS — F1120 Opioid dependence, uncomplicated: Secondary | ICD-10-CM | POA: Diagnosis not present

## 2023-05-31 DIAGNOSIS — Z7151 Drug abuse counseling and surveillance of drug abuser: Secondary | ICD-10-CM | POA: Diagnosis not present

## 2023-06-02 DIAGNOSIS — F1120 Opioid dependence, uncomplicated: Secondary | ICD-10-CM | POA: Diagnosis not present

## 2023-06-02 DIAGNOSIS — Z7151 Drug abuse counseling and surveillance of drug abuser: Secondary | ICD-10-CM | POA: Diagnosis not present

## 2023-06-02 NOTE — Procedures (Signed)
 S1 Lumbosacral Transforaminal Epidural Steroid Injection - Sub-Pedicular Approach with Fluoroscopic Guidance   Patient: Charles [name]      Date of Birth: 07/13/1970 MRN: [id number] PCP: Omie Bickers, MD      Visit Date: 05/20/2023   Universal Protocol:    Date/Time: 05/28/258:33 PM  Consent Given By: the patient  Position:  PRONE  Additional Comments: Vital signs were monitored before and after the procedure. Patient was prepped and draped in the usual sterile fashion. The correct patient, procedure, and site was verified.   Injection Procedure Details:  Procedure Site One Meds Administered:  Meds ordered this encounter  Medications   methylPREDNISolone  acetate (DEPO-MEDROL ) injection 40 mg    Laterality: Right  Location/Site:  S1 Foramen   Needle size: 22 ga.  Needle type: Spinal  Needle Placement: Transforaminal  Findings:   -Comments: Excellent flow of contrast along the nerve, nerve root and into the epidural space.  Epidurogram: Contrast epidurogram showed no nerve root cut off or restricted flow pattern.  Procedure Details: After squaring off the sacral end-plate to get a true AP view, the C-arm was positioned so that the best possible view of the S1 foramen was visualized. The soft tissues overlying this structure were infiltrated with 2-3 ml. of 1% Lidocaine  without Epinephrine .    The spinal needle was inserted toward the target using a "trajectory" view along the fluoroscope beam.  Under AP and lateral visualization, the needle was advanced so it did not puncture dura. Biplanar projections were used to confirm position. Aspiration was confirmed to be negative for CSF and/or blood. A 1-2 ml. volume of Isovue -250 was injected and flow of contrast was noted at each level. Radiographs were obtained for documentation purposes.   After attaining the desired flow of contrast documented above, a 0.5 to 1.0 ml test dose of 0.25% Marcaine  was injected into each  respective transforaminal space.  The patient was observed for 90 seconds post injection.  After no sensory deficits were reported, and normal lower extremity motor function was noted,   the above injectate was administered so that equal amounts of the injectate were placed at each foramen (level) into the transforaminal epidural space.   Additional Comments:  The patient tolerated the procedure well Dressing: Band-Aid with 2 x 2 sterile gauze    Post-procedure details: Patient was observed during the procedure. Post-procedure instructions were reviewed.  Patient left the clinic in stable condition.

## 2023-06-02 NOTE — Progress Notes (Signed)
 Charles [name] - 53 y.o. male MRN [id number]  Date of birth: 05-10-1970  Office Visit Note: Visit Date: 05/20/2023 PCP: Omie Bickers, MD Referred by: Omie Bickers, MD  Subjective: Chief Complaint  Patient presents with   Lower Back - Pain   HPI:  Charles [name] is a 53 y.o. male who comes in today for planned repeat Right S1-2  Lumbar Transforaminal epidural steroid injection with fluoroscopic guidance.  The patient has failed conservative care including home exercise, medications, time and activity modification.  This injection will be diagnostic and hopefully therapeutic.  Please see requesting physician notes for further details and justification. Patient received more than 50% pain relief from prior injection.   Referring: Elvan Hamel, FNP and Dr. Colette Davies   ROS Otherwise per HPI.  Assessment & Plan: Visit Diagnoses:    ICD-10-CM   1. Lumbar radiculopathy  M54.16 XR C-ARM NO REPORT    Epidural Steroid injection    methylPREDNISolone  acetate (DEPO-MEDROL ) injection 40 mg      Plan: No additional findings.   Meds & Orders:  Meds ordered this encounter  Medications   methylPREDNISolone  acetate (DEPO-MEDROL ) injection 40 mg    Orders Placed This Encounter  Procedures   XR C-ARM NO REPORT   Epidural Steroid injection    Follow-up: No follow-ups on file.   Procedures: No procedures performed  S1 Lumbosacral Transforaminal Epidural Steroid Injection - Sub-Pedicular Approach with Fluoroscopic Guidance   Patient: Charles [name]      Date of Birth: 23-Sep-1970 MRN: [id number] PCP: Omie Bickers, MD      Visit Date: 05/20/2023   Universal Protocol:    Date/Time: 05/28/258:33 PM  Consent Given By: the patient  Position:  PRONE  Additional Comments: Vital signs were monitored before and after the procedure. Patient was prepped and draped in the usual sterile fashion. The correct patient, procedure, and site was verified.   Injection Procedure Details:   Procedure Site One Meds Administered:  Meds ordered this encounter  Medications   methylPREDNISolone  acetate (DEPO-MEDROL ) injection 40 mg    Laterality: Right  Location/Site:  S1 Foramen   Needle size: 22 ga.  Needle type: Spinal  Needle Placement: Transforaminal  Findings:   -Comments: Excellent flow of contrast along the nerve, nerve root and into the epidural space.  Epidurogram: Contrast epidurogram showed no nerve root cut off or restricted flow pattern.  Procedure Details: After squaring off the sacral end-plate to get a true AP view, the C-arm was positioned so that the best possible view of the S1 foramen was visualized. The soft tissues overlying this structure were infiltrated with 2-3 ml. of 1% Lidocaine  without Epinephrine .    The spinal needle was inserted toward the target using a "trajectory" view along the fluoroscope beam.  Under AP and lateral visualization, the needle was advanced so it did not puncture dura. Biplanar projections were used to confirm position. Aspiration was confirmed to be negative for CSF and/or blood. A 1-2 ml. volume of Isovue -250 was injected and flow of contrast was noted at each level. Radiographs were obtained for documentation purposes.   After attaining the desired flow of contrast documented above, a 0.5 to 1.0 ml test dose of 0.25% Marcaine  was injected into each respective transforaminal space.  The patient was observed for 90 seconds post injection.  After no sensory deficits were reported, and normal lower extremity motor function was noted,   the above injectate was administered so that equal amounts  of the injectate were placed at each foramen (level) into the transforaminal epidural space.   Additional Comments:  The patient tolerated the procedure well Dressing: Band-Aid with 2 x 2 sterile gauze    Post-procedure details: Patient was observed during the procedure. Post-procedure instructions were reviewed.  Patient  left the clinic in stable condition.   Clinical History: MRI LUMBAR SPINE WITHOUT CONTRAST   TECHNIQUE: Multiplanar, multisequence MR imaging of the lumbar spine was performed. No intravenous contrast was administered.   COMPARISON:  MRI 10/27/2019   FINDINGS: Segmentation:  Standard.   Alignment: Straightening with slight reversal of the lumbar lordosis. Trace retrolisthesis of L5 on S1.   Vertebrae:  No fracture, evidence of discitis, or bone lesion.   Conus medullaris and cauda equina: Conus extends to the L1 level. Conus and cauda equina appear normal.   Paraspinal and other soft tissues: Negative.   Disc levels:   T12-L1: Unremarkable.   L1-L2: Unremarkable.   L2-L3: Minimal annular disc bulge. No foraminal or canal stenosis. Unchanged.   L3-L4: Minimal annular disc bulge. No foraminal or canal stenosis. Unchanged.   L4-L5: Mild annular disc bulge and endplate osteophytic spurring. Minimal facet hypertrophy. Mild-moderate right and mild left foraminal stenosis. No canal stenosis. No significant interval progression from prior.   L5-S1: Mild annular disc bulge with endplate osteophytic spurring. Mild bilateral facet hypertrophy. Moderate bilateral foraminal stenosis. No canal stenosis. No significant interval progression from prior.   IMPRESSION: 1. Mild degenerative changes of the lumbar spine, not significantly progressed from prior study. 2. 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 3. Mild-moderate right and mild left foraminal stenosis at L4-L5. 4. No canal stenosis at any level.     Electronically Signed   By: Leverne Reading D.O.   On: 10/21/2021 10:39     Objective:  VS:  HT:    WT:   BMI:     BP:110/77  HR:87bpm  TEMP: ( )  RESP:  Physical Exam Vitals and nursing note reviewed.  Constitutional:      General: He is not in acute distress.    Appearance: Normal appearance. He is not ill-appearing.  HENT:     Head: Normocephalic  and atraumatic.     Right Ear: External ear normal.     Left Ear: External ear normal.     Nose: No congestion.  Eyes:     Extraocular Movements: Extraocular movements intact.  Cardiovascular:     Rate and Rhythm: Normal rate.     Pulses: Normal pulses.  Pulmonary:     Effort: Pulmonary effort is normal. No respiratory distress.  Abdominal:     General: There is no distension.     Palpations: Abdomen is soft.  Musculoskeletal:        General: No tenderness or signs of injury.     Cervical back: Neck supple.     Right lower leg: No edema.     Left lower leg: No edema.     Comments: Patient has good distal strength without clonus.  Skin:    Findings: No erythema or rash.  Neurological:     General: No focal deficit present.     Mental Status: He is alert and oriented to person, place, and time.     Sensory: No sensory deficit.     Motor: No weakness or abnormal muscle tone.     Coordination: Coordination normal.  Psychiatric:        Mood and Affect: Mood normal.  Behavior: Behavior normal.      Imaging: No results found.

## 2023-06-04 DIAGNOSIS — F1120 Opioid dependence, uncomplicated: Secondary | ICD-10-CM | POA: Diagnosis not present

## 2023-06-04 DIAGNOSIS — Z7151 Drug abuse counseling and surveillance of drug abuser: Secondary | ICD-10-CM | POA: Diagnosis not present

## 2023-06-09 DIAGNOSIS — F1120 Opioid dependence, uncomplicated: Secondary | ICD-10-CM | POA: Diagnosis not present

## 2023-06-09 DIAGNOSIS — Z7151 Drug abuse counseling and surveillance of drug abuser: Secondary | ICD-10-CM | POA: Diagnosis not present

## 2023-06-14 DIAGNOSIS — Z7151 Drug abuse counseling and surveillance of drug abuser: Secondary | ICD-10-CM | POA: Diagnosis not present

## 2023-06-14 DIAGNOSIS — F1120 Opioid dependence, uncomplicated: Secondary | ICD-10-CM | POA: Diagnosis not present

## 2023-06-16 DIAGNOSIS — Z7151 Drug abuse counseling and surveillance of drug abuser: Secondary | ICD-10-CM | POA: Diagnosis not present

## 2023-06-16 DIAGNOSIS — F1120 Opioid dependence, uncomplicated: Secondary | ICD-10-CM | POA: Diagnosis not present

## 2023-06-21 DIAGNOSIS — F1120 Opioid dependence, uncomplicated: Secondary | ICD-10-CM | POA: Diagnosis not present

## 2023-06-21 DIAGNOSIS — Z7151 Drug abuse counseling and surveillance of drug abuser: Secondary | ICD-10-CM | POA: Diagnosis not present

## 2023-06-23 DIAGNOSIS — F1120 Opioid dependence, uncomplicated: Secondary | ICD-10-CM | POA: Diagnosis not present

## 2023-06-23 DIAGNOSIS — Z7151 Drug abuse counseling and surveillance of drug abuser: Secondary | ICD-10-CM | POA: Diagnosis not present

## 2023-06-25 DIAGNOSIS — Z7151 Drug abuse counseling and surveillance of drug abuser: Secondary | ICD-10-CM | POA: Diagnosis not present

## 2023-06-25 DIAGNOSIS — F1120 Opioid dependence, uncomplicated: Secondary | ICD-10-CM | POA: Diagnosis not present

## 2023-06-30 DIAGNOSIS — F1120 Opioid dependence, uncomplicated: Secondary | ICD-10-CM | POA: Diagnosis not present

## 2023-06-30 DIAGNOSIS — Z7151 Drug abuse counseling and surveillance of drug abuser: Secondary | ICD-10-CM | POA: Diagnosis not present

## 2023-07-05 DIAGNOSIS — F1120 Opioid dependence, uncomplicated: Secondary | ICD-10-CM | POA: Diagnosis not present

## 2023-07-05 DIAGNOSIS — Z7151 Drug abuse counseling and surveillance of drug abuser: Secondary | ICD-10-CM | POA: Diagnosis not present

## 2023-07-07 DIAGNOSIS — Z7151 Drug abuse counseling and surveillance of drug abuser: Secondary | ICD-10-CM | POA: Diagnosis not present

## 2023-07-07 DIAGNOSIS — F1120 Opioid dependence, uncomplicated: Secondary | ICD-10-CM | POA: Diagnosis not present

## 2023-07-12 DIAGNOSIS — Z7151 Drug abuse counseling and surveillance of drug abuser: Secondary | ICD-10-CM | POA: Diagnosis not present

## 2023-07-12 DIAGNOSIS — F1120 Opioid dependence, uncomplicated: Secondary | ICD-10-CM | POA: Diagnosis not present

## 2023-07-14 DIAGNOSIS — Z7151 Drug abuse counseling and surveillance of drug abuser: Secondary | ICD-10-CM | POA: Diagnosis not present

## 2023-07-14 DIAGNOSIS — F1120 Opioid dependence, uncomplicated: Secondary | ICD-10-CM | POA: Diagnosis not present

## 2023-07-16 DIAGNOSIS — Z7151 Drug abuse counseling and surveillance of drug abuser: Secondary | ICD-10-CM | POA: Diagnosis not present

## 2023-07-16 DIAGNOSIS — F1120 Opioid dependence, uncomplicated: Secondary | ICD-10-CM | POA: Diagnosis not present

## 2023-07-19 DIAGNOSIS — F1120 Opioid dependence, uncomplicated: Secondary | ICD-10-CM | POA: Diagnosis not present

## 2023-07-19 DIAGNOSIS — Z7151 Drug abuse counseling and surveillance of drug abuser: Secondary | ICD-10-CM | POA: Diagnosis not present

## 2023-07-21 DIAGNOSIS — Z7151 Drug abuse counseling and surveillance of drug abuser: Secondary | ICD-10-CM | POA: Diagnosis not present

## 2023-07-21 DIAGNOSIS — F1120 Opioid dependence, uncomplicated: Secondary | ICD-10-CM | POA: Diagnosis not present

## 2023-07-26 DIAGNOSIS — F1120 Opioid dependence, uncomplicated: Secondary | ICD-10-CM | POA: Diagnosis not present

## 2023-07-26 DIAGNOSIS — Z7151 Drug abuse counseling and surveillance of drug abuser: Secondary | ICD-10-CM | POA: Diagnosis not present

## 2023-07-30 DIAGNOSIS — Z7151 Drug abuse counseling and surveillance of drug abuser: Secondary | ICD-10-CM | POA: Diagnosis not present

## 2023-07-30 DIAGNOSIS — F1120 Opioid dependence, uncomplicated: Secondary | ICD-10-CM | POA: Diagnosis not present

## 2023-08-02 DIAGNOSIS — F1120 Opioid dependence, uncomplicated: Secondary | ICD-10-CM | POA: Diagnosis not present

## 2023-08-02 DIAGNOSIS — Z7151 Drug abuse counseling and surveillance of drug abuser: Secondary | ICD-10-CM | POA: Diagnosis not present

## 2023-08-04 DIAGNOSIS — F1120 Opioid dependence, uncomplicated: Secondary | ICD-10-CM | POA: Diagnosis not present

## 2023-08-04 DIAGNOSIS — Z7151 Drug abuse counseling and surveillance of drug abuser: Secondary | ICD-10-CM | POA: Diagnosis not present

## 2023-08-09 DIAGNOSIS — F1120 Opioid dependence, uncomplicated: Secondary | ICD-10-CM | POA: Diagnosis not present

## 2023-08-09 DIAGNOSIS — Z7151 Drug abuse counseling and surveillance of drug abuser: Secondary | ICD-10-CM | POA: Diagnosis not present

## 2023-08-13 DIAGNOSIS — Z7151 Drug abuse counseling and surveillance of drug abuser: Secondary | ICD-10-CM | POA: Diagnosis not present

## 2023-08-13 DIAGNOSIS — F1120 Opioid dependence, uncomplicated: Secondary | ICD-10-CM | POA: Diagnosis not present

## 2023-08-16 DIAGNOSIS — Z7151 Drug abuse counseling and surveillance of drug abuser: Secondary | ICD-10-CM | POA: Diagnosis not present

## 2023-08-16 DIAGNOSIS — F1120 Opioid dependence, uncomplicated: Secondary | ICD-10-CM | POA: Diagnosis not present

## 2023-08-17 ENCOUNTER — Other Ambulatory Visit: Payer: Self-pay | Admitting: Physical Medicine and Rehabilitation

## 2023-08-17 ENCOUNTER — Telehealth: Payer: Self-pay | Admitting: Physical Medicine and Rehabilitation

## 2023-08-17 DIAGNOSIS — M5416 Radiculopathy, lumbar region: Secondary | ICD-10-CM

## 2023-08-17 NOTE — Telephone Encounter (Signed)
 Patient called. Says he needs an appointment with Dr. Eldonna ASAP

## 2023-08-18 DIAGNOSIS — F1120 Opioid dependence, uncomplicated: Secondary | ICD-10-CM | POA: Diagnosis not present

## 2023-08-18 DIAGNOSIS — Z7151 Drug abuse counseling and surveillance of drug abuser: Secondary | ICD-10-CM | POA: Diagnosis not present

## 2023-08-20 ENCOUNTER — Telehealth: Payer: Self-pay | Admitting: Physical Medicine and Rehabilitation

## 2023-08-20 NOTE — Telephone Encounter (Signed)
 Pt called about an update about injection. Pt phone number is [phone].

## 2023-08-20 NOTE — Telephone Encounter (Signed)
 Notified patient per insurance 3 months between injections.

## 2023-08-23 DIAGNOSIS — F1120 Opioid dependence, uncomplicated: Secondary | ICD-10-CM | POA: Diagnosis not present

## 2023-08-23 DIAGNOSIS — Z7151 Drug abuse counseling and surveillance of drug abuser: Secondary | ICD-10-CM | POA: Diagnosis not present

## 2023-09-03 DIAGNOSIS — Z7151 Drug abuse counseling and surveillance of drug abuser: Secondary | ICD-10-CM | POA: Diagnosis not present

## 2023-09-03 DIAGNOSIS — F1120 Opioid dependence, uncomplicated: Secondary | ICD-10-CM | POA: Diagnosis not present

## 2023-09-06 DIAGNOSIS — F1120 Opioid dependence, uncomplicated: Secondary | ICD-10-CM | POA: Diagnosis not present

## 2023-09-06 DIAGNOSIS — Z7151 Drug abuse counseling and surveillance of drug abuser: Secondary | ICD-10-CM | POA: Diagnosis not present

## 2023-09-15 DIAGNOSIS — Z7151 Drug abuse counseling and surveillance of drug abuser: Secondary | ICD-10-CM | POA: Diagnosis not present

## 2023-09-15 DIAGNOSIS — F1120 Opioid dependence, uncomplicated: Secondary | ICD-10-CM | POA: Diagnosis not present

## 2023-09-24 DIAGNOSIS — R7303 Prediabetes: Secondary | ICD-10-CM | POA: Diagnosis not present

## 2023-09-24 DIAGNOSIS — I10 Essential (primary) hypertension: Secondary | ICD-10-CM | POA: Diagnosis not present

## 2023-09-28 ENCOUNTER — Other Ambulatory Visit: Payer: Self-pay

## 2023-09-28 ENCOUNTER — Ambulatory Visit (INDEPENDENT_AMBULATORY_CARE_PROVIDER_SITE_OTHER): Admitting: Physical Medicine and Rehabilitation

## 2023-09-28 VITALS — BP 136/83 | HR 72

## 2023-09-28 DIAGNOSIS — M5416 Radiculopathy, lumbar region: Secondary | ICD-10-CM

## 2023-09-28 MED ORDER — METHYLPREDNISOLONE ACETATE 80 MG/ML IJ SUSP
40.0000 mg | Freq: Once | INTRAMUSCULAR | Status: AC
  Administered 2023-09-28: 40 mg

## 2023-09-28 NOTE — Progress Notes (Signed)
 Charles [name] - 53 y.o. male MRN [id number]  Date of birth: [birth year]  Office Visit Note: Visit Date: 09/28/2023 PCP: Shona Norleen PEDLAR, MD Referred by: Shona Norleen PEDLAR, MD  Subjective: Chief Complaint  Patient presents with   Lower Back - Pain   HPI:  Charles [name] is a 53 y.o. male who comes in today for planned repeat Right S1-2  Lumbar Transforaminal epidural steroid injection with fluoroscopic guidance.  The patient has failed conservative care including home exercise, medications, time and activity modification.  This injection will be diagnostic and hopefully therapeutic.  Please see requesting physician notes for further details and justification. Patient received more than 50% pain relief from prior injection. May need follow up with Dr. Georgina. And or may need new images.  Referring: Duwaine Pouch, FNP   ROS Otherwise per HPI.  Assessment & Plan: Visit Diagnoses:    ICD-10-CM   1. Lumbar radiculopathy  M54.16 XR C-ARM NO REPORT    Epidural Steroid injection    methylPREDNISolone  acetate (DEPO-MEDROL ) injection 40 mg      Plan: No additional findings.   Meds & Orders:  Meds ordered this encounter  Medications   methylPREDNISolone  acetate (DEPO-MEDROL ) injection 40 mg    Orders Placed This Encounter  Procedures   XR C-ARM NO REPORT   Epidural Steroid injection    Follow-up: Return for visit to requesting provider as needed.   Procedures: No procedures performed      Clinical History: MRI LUMBAR SPINE WITHOUT CONTRAST   TECHNIQUE: Multiplanar, multisequence MR imaging of the lumbar spine was performed. No intravenous contrast was administered.   COMPARISON:  MRI 10/27/2019   FINDINGS: Segmentation:  Standard.   Alignment: Straightening with slight reversal of the lumbar lordosis. Trace retrolisthesis of L5 on S1.   Vertebrae:  No fracture, evidence of discitis, or bone lesion.   Conus medullaris and cauda equina: Conus extends to the L1 level. Conus  and cauda equina appear normal.   Paraspinal and other soft tissues: Negative.   Disc levels:   T12-L1: Unremarkable.   L1-L2: Unremarkable.   L2-L3: Minimal annular disc bulge. No foraminal or canal stenosis. Unchanged.   L3-L4: Minimal annular disc bulge. No foraminal or canal stenosis. Unchanged.   L4-L5: Mild annular disc bulge and endplate osteophytic spurring. Minimal facet hypertrophy. Mild-moderate right and mild left foraminal stenosis. No canal stenosis. No significant interval progression from prior.   L5-S1: Mild annular disc bulge with endplate osteophytic spurring. Mild bilateral facet hypertrophy. Moderate bilateral foraminal stenosis. No canal stenosis. No significant interval progression from prior.   IMPRESSION: 1. Mild degenerative changes of the lumbar spine, not significantly progressed from prior study. 2. 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 3. Mild-moderate right and mild left foraminal stenosis at L4-L5. 4. No canal stenosis at any level.     Electronically Signed   By: Mabel Converse D.O.   On: 10/21/2021 10:39     Objective:  VS:  HT:    WT:   BMI:     BP:136/83  HR:72bpm  TEMP: ( )  RESP:  Physical Exam Vitals and nursing note reviewed.  Constitutional:      General: He is not in acute distress.    Appearance: Normal appearance. He is well-developed. He is not ill-appearing.  HENT:     Head: Normocephalic and atraumatic.     Right Ear: External ear normal.     Left Ear: External ear normal.     Nose:  No congestion.  Eyes:     Extraocular Movements: Extraocular movements intact.     Conjunctiva/sclera: Conjunctivae normal.     Pupils: Pupils are equal, round, and reactive to light.  Cardiovascular:     Rate and Rhythm: Normal rate.     Pulses: Normal pulses.     Heart sounds: Normal heart sounds.  Pulmonary:     Effort: Pulmonary effort is normal. No respiratory distress.  Abdominal:     General: There is no  distension.     Palpations: Abdomen is soft.  Musculoskeletal:        General: No tenderness or signs of injury.     Cervical back: Normal range of motion and neck supple. No rigidity.     Right lower leg: No edema.     Left lower leg: No edema.     Comments: Patient has good distal strength without clonus.  Skin:    General: Skin is warm and dry.     Findings: No erythema or rash.  Neurological:     General: No focal deficit present.     Mental Status: He is alert and oriented to person, place, and time.     Sensory: No sensory deficit.     Motor: No weakness or abnormal muscle tone.     Coordination: Coordination normal.     Gait: Gait normal.  Psychiatric:        Mood and Affect: Mood normal.        Behavior: Behavior normal.      Imaging: No results found.

## 2023-09-28 NOTE — Progress Notes (Signed)
 Pain Scale   Average Pain 10 Patient advising his lower back pain worsens in the evening after being on his feet all day. Patient advising his pain is constant.        +Driver, -BT, -Dye Allergies.

## 2023-09-30 ENCOUNTER — Encounter: Admitting: Physical Medicine and Rehabilitation

## 2023-10-04 DIAGNOSIS — Z7151 Drug abuse counseling and surveillance of drug abuser: Secondary | ICD-10-CM | POA: Diagnosis not present

## 2023-10-13 DIAGNOSIS — Z7151 Drug abuse counseling and surveillance of drug abuser: Secondary | ICD-10-CM | POA: Diagnosis not present

## 2023-10-13 DIAGNOSIS — F1120 Opioid dependence, uncomplicated: Secondary | ICD-10-CM | POA: Diagnosis not present

## 2023-10-18 DIAGNOSIS — Z7151 Drug abuse counseling and surveillance of drug abuser: Secondary | ICD-10-CM | POA: Diagnosis not present

## 2023-10-20 DIAGNOSIS — M5416 Radiculopathy, lumbar region: Secondary | ICD-10-CM | POA: Diagnosis not present

## 2023-11-08 ENCOUNTER — Encounter: Payer: Self-pay | Admitting: Radiology

## 2023-11-10 ENCOUNTER — Other Ambulatory Visit (HOSPITAL_COMMUNITY): Payer: Self-pay | Admitting: Neurosurgery

## 2023-11-10 DIAGNOSIS — M5416 Radiculopathy, lumbar region: Secondary | ICD-10-CM

## 2023-11-16 ENCOUNTER — Ambulatory Visit (HOSPITAL_COMMUNITY)
Admission: RE | Admit: 2023-11-16 | Discharge: 2023-11-16 | Disposition: A | Discharge status: Home / Self Care | Source: Ambulatory Visit | Attending: Neurosurgery | Admitting: Neurosurgery

## 2023-11-16 DIAGNOSIS — M5416 Radiculopathy, lumbar region: Secondary | ICD-10-CM | POA: Diagnosis present
